# Patient Record
Sex: Female | Born: 1940 | Race: White | Hispanic: No | Smoking: Former smoker
Health system: Southern US, Community
[De-identification: ages and names within clinical notes are randomized; demographics above are authoritative.]

## PROBLEM LIST (undated history)

## (undated) DIAGNOSIS — I6389 Other cerebral infarction: Secondary | ICD-10-CM

## (undated) DIAGNOSIS — J449 Chronic obstructive pulmonary disease, unspecified: Secondary | ICD-10-CM

## (undated) DIAGNOSIS — F419 Anxiety disorder, unspecified: Secondary | ICD-10-CM

## (undated) DIAGNOSIS — Z9889 Other specified postprocedural states: Secondary | ICD-10-CM

## (undated) DIAGNOSIS — R002 Palpitations: Secondary | ICD-10-CM

## (undated) DIAGNOSIS — N3281 Overactive bladder: Secondary | ICD-10-CM

## (undated) DIAGNOSIS — J189 Pneumonia, unspecified organism: Secondary | ICD-10-CM

## (undated) DIAGNOSIS — F32A Depression, unspecified: Secondary | ICD-10-CM

## (undated) DIAGNOSIS — Z87442 Personal history of urinary calculi: Secondary | ICD-10-CM

## (undated) DIAGNOSIS — C50919 Malignant neoplasm of unspecified site of unspecified female breast: Secondary | ICD-10-CM

## (undated) DIAGNOSIS — I2510 Atherosclerotic heart disease of native coronary artery without angina pectoris: Secondary | ICD-10-CM

## (undated) DIAGNOSIS — I671 Cerebral aneurysm, nonruptured: Secondary | ICD-10-CM

## (undated) DIAGNOSIS — N393 Stress incontinence (female) (male): Secondary | ICD-10-CM

## (undated) DIAGNOSIS — Z923 Personal history of irradiation: Secondary | ICD-10-CM

## (undated) DIAGNOSIS — I639 Cerebral infarction, unspecified: Secondary | ICD-10-CM

## (undated) DIAGNOSIS — R112 Nausea with vomiting, unspecified: Secondary | ICD-10-CM

## (undated) DIAGNOSIS — T7840XA Allergy, unspecified, initial encounter: Secondary | ICD-10-CM

## (undated) DIAGNOSIS — K589 Irritable bowel syndrome without diarrhea: Secondary | ICD-10-CM

## (undated) DIAGNOSIS — M479 Spondylosis, unspecified: Secondary | ICD-10-CM

## (undated) DIAGNOSIS — M4800 Spinal stenosis, site unspecified: Secondary | ICD-10-CM

## (undated) DIAGNOSIS — I10 Essential (primary) hypertension: Secondary | ICD-10-CM

## (undated) DIAGNOSIS — K219 Gastro-esophageal reflux disease without esophagitis: Secondary | ICD-10-CM

## (undated) DIAGNOSIS — E785 Hyperlipidemia, unspecified: Secondary | ICD-10-CM

## (undated) DIAGNOSIS — Z79811 Long term (current) use of aromatase inhibitors: Secondary | ICD-10-CM

## (undated) DIAGNOSIS — D649 Anemia, unspecified: Secondary | ICD-10-CM

## (undated) DIAGNOSIS — I739 Peripheral vascular disease, unspecified: Secondary | ICD-10-CM

## (undated) DIAGNOSIS — I499 Cardiac arrhythmia, unspecified: Secondary | ICD-10-CM

## (undated) DIAGNOSIS — S272XXA Traumatic hemopneumothorax, initial encounter: Secondary | ICD-10-CM

## (undated) DIAGNOSIS — Z87448 Personal history of other diseases of urinary system: Secondary | ICD-10-CM

## (undated) DIAGNOSIS — F329 Major depressive disorder, single episode, unspecified: Secondary | ICD-10-CM

## (undated) DIAGNOSIS — M1990 Unspecified osteoarthritis, unspecified site: Secondary | ICD-10-CM

## (undated) HISTORY — DX: Other cerebral infarction: I63.89

## (undated) HISTORY — PX: VEIN SURGERY: SHX48

## (undated) HISTORY — PX: ORIF ANKLE FRACTURE: SUR919

## (undated) HISTORY — DX: Essential (primary) hypertension: I10

## (undated) HISTORY — DX: Irritable bowel syndrome, unspecified: K58.9

## (undated) HISTORY — DX: Allergy, unspecified, initial encounter: T78.40XA

## (undated) HISTORY — DX: Long term (current) use of aromatase inhibitors: Z79.811

## (undated) HISTORY — DX: Gastro-esophageal reflux disease without esophagitis: K21.9

## (undated) HISTORY — PX: ADENOIDECTOMY: SUR15

## (undated) HISTORY — DX: Personal history of irradiation: Z92.3

## (undated) HISTORY — DX: Hyperlipidemia, unspecified: E78.5

## (undated) HISTORY — PX: COLONOSCOPY: SHX5424

## (undated) HISTORY — PX: KNEE SURGERY: SHX244

## (undated) HISTORY — PX: CHOLECYSTECTOMY: SHX55

## (undated) HISTORY — PX: TONSILLECTOMY: SUR1361

## (undated) HISTORY — PX: ANKLE HARDWARE REMOVAL: SHX1149

## (undated) HISTORY — DX: Chronic obstructive pulmonary disease, unspecified: J44.9

## (undated) HISTORY — DX: Peripheral vascular disease, unspecified: I73.9

---

## 1974-03-14 HISTORY — PX: BREAST BIOPSY: SHX20

## 1974-03-14 HISTORY — PX: TUBAL LIGATION: SHX77

## 1980-03-14 DIAGNOSIS — S272XXA Traumatic hemopneumothorax, initial encounter: Secondary | ICD-10-CM

## 1980-03-14 HISTORY — DX: Traumatic hemopneumothorax, initial encounter: S27.2XXA

## 1998-08-21 ENCOUNTER — Other Ambulatory Visit: Admission: RE | Admit: 1998-08-21 | Discharge: 1998-08-21 | Payer: Self-pay | Admitting: Family Medicine

## 1999-01-29 ENCOUNTER — Encounter: Admission: RE | Admit: 1999-01-29 | Discharge: 1999-02-22 | Payer: Self-pay | Admitting: Family Medicine

## 1999-08-30 ENCOUNTER — Other Ambulatory Visit: Admission: RE | Admit: 1999-08-30 | Discharge: 1999-08-30 | Payer: Self-pay | Admitting: Family Medicine

## 2000-06-26 ENCOUNTER — Other Ambulatory Visit: Admission: RE | Admit: 2000-06-26 | Discharge: 2000-06-26 | Payer: Self-pay | Admitting: *Deleted

## 2000-10-30 ENCOUNTER — Encounter: Payer: Self-pay | Admitting: Family Medicine

## 2000-10-30 ENCOUNTER — Encounter: Admission: RE | Admit: 2000-10-30 | Discharge: 2000-10-30 | Payer: Self-pay | Admitting: Family Medicine

## 2001-01-01 ENCOUNTER — Other Ambulatory Visit: Admission: RE | Admit: 2001-01-01 | Discharge: 2001-01-01 | Payer: Self-pay | Admitting: *Deleted

## 2001-07-09 ENCOUNTER — Other Ambulatory Visit: Admission: RE | Admit: 2001-07-09 | Discharge: 2001-07-09 | Payer: Self-pay | Admitting: *Deleted

## 2001-11-19 ENCOUNTER — Encounter: Admission: RE | Admit: 2001-11-19 | Discharge: 2001-11-19 | Payer: Self-pay | Admitting: Family Medicine

## 2001-11-19 ENCOUNTER — Encounter: Payer: Self-pay | Admitting: Family Medicine

## 2002-07-02 ENCOUNTER — Encounter: Payer: Self-pay | Admitting: Family Medicine

## 2002-07-02 ENCOUNTER — Encounter: Admission: RE | Admit: 2002-07-02 | Discharge: 2002-07-02 | Payer: Self-pay | Admitting: Family Medicine

## 2002-07-12 ENCOUNTER — Other Ambulatory Visit: Admission: RE | Admit: 2002-07-12 | Discharge: 2002-07-12 | Payer: Self-pay | Admitting: *Deleted

## 2002-12-03 ENCOUNTER — Encounter: Admission: RE | Admit: 2002-12-03 | Discharge: 2002-12-03 | Payer: Self-pay | Admitting: Family Medicine

## 2002-12-03 ENCOUNTER — Encounter: Payer: Self-pay | Admitting: Family Medicine

## 2003-12-05 ENCOUNTER — Encounter: Admission: RE | Admit: 2003-12-05 | Discharge: 2003-12-05 | Payer: Self-pay | Admitting: *Deleted

## 2004-01-21 ENCOUNTER — Ambulatory Visit: Payer: Self-pay | Admitting: Internal Medicine

## 2004-01-28 ENCOUNTER — Ambulatory Visit: Payer: Self-pay | Admitting: Internal Medicine

## 2004-09-07 ENCOUNTER — Ambulatory Visit: Payer: Self-pay | Admitting: Family Medicine

## 2004-12-09 ENCOUNTER — Ambulatory Visit (HOSPITAL_COMMUNITY): Admission: RE | Admit: 2004-12-09 | Discharge: 2004-12-09 | Payer: Self-pay | Admitting: *Deleted

## 2004-12-14 ENCOUNTER — Ambulatory Visit: Payer: Self-pay | Admitting: Family Medicine

## 2004-12-21 ENCOUNTER — Encounter: Admission: RE | Admit: 2004-12-21 | Discharge: 2004-12-21 | Payer: Self-pay | Admitting: Family Medicine

## 2004-12-21 ENCOUNTER — Ambulatory Visit: Payer: Self-pay | Admitting: Family Medicine

## 2005-05-16 ENCOUNTER — Ambulatory Visit: Payer: Self-pay | Admitting: Cardiology

## 2005-05-16 ENCOUNTER — Ambulatory Visit: Payer: Self-pay | Admitting: Family Medicine

## 2005-07-05 ENCOUNTER — Ambulatory Visit (HOSPITAL_COMMUNITY): Admission: RE | Admit: 2005-07-05 | Discharge: 2005-07-05 | Payer: Self-pay | Admitting: Urology

## 2005-07-05 HISTORY — PX: CYSTOSCOPY: SUR368

## 2005-07-05 HISTORY — PX: TRANSOBTURATOR SLING: SHX2571

## 2005-08-24 ENCOUNTER — Ambulatory Visit: Payer: Self-pay | Admitting: Family Medicine

## 2005-09-01 ENCOUNTER — Encounter: Admission: RE | Admit: 2005-09-01 | Discharge: 2005-09-28 | Payer: Self-pay | Admitting: Family Medicine

## 2005-09-01 ENCOUNTER — Ambulatory Visit: Payer: Self-pay | Admitting: Family Medicine

## 2005-09-12 ENCOUNTER — Encounter: Admission: RE | Admit: 2005-09-12 | Discharge: 2005-09-12 | Payer: Self-pay | Admitting: *Deleted

## 2005-12-02 ENCOUNTER — Ambulatory Visit: Payer: Self-pay | Admitting: Family Medicine

## 2005-12-12 ENCOUNTER — Ambulatory Visit (HOSPITAL_COMMUNITY): Admission: RE | Admit: 2005-12-12 | Discharge: 2005-12-12 | Payer: Self-pay | Admitting: *Deleted

## 2005-12-15 ENCOUNTER — Ambulatory Visit: Payer: Self-pay | Admitting: Family Medicine

## 2005-12-22 ENCOUNTER — Ambulatory Visit: Payer: Self-pay | Admitting: Family Medicine

## 2005-12-28 ENCOUNTER — Ambulatory Visit: Payer: Self-pay | Admitting: Internal Medicine

## 2006-09-13 ENCOUNTER — Encounter: Admission: RE | Admit: 2006-09-13 | Discharge: 2006-09-13 | Payer: Self-pay | Admitting: *Deleted

## 2006-12-15 ENCOUNTER — Encounter: Admission: RE | Admit: 2006-12-15 | Discharge: 2006-12-15 | Payer: Self-pay | Admitting: Family Medicine

## 2006-12-22 ENCOUNTER — Ambulatory Visit: Payer: Self-pay | Admitting: Family Medicine

## 2006-12-29 ENCOUNTER — Telehealth: Payer: Self-pay | Admitting: Family Medicine

## 2007-02-02 ENCOUNTER — Ambulatory Visit: Payer: Self-pay | Admitting: Family Medicine

## 2007-02-02 LAB — CONVERTED CEMR LAB
AST: 26 units/L (ref 0–37)
Albumin: 4.1 g/dL (ref 3.5–5.2)
Alkaline Phosphatase: 60 units/L (ref 39–117)
Basophils Absolute: 0 10*3/uL (ref 0.0–0.1)
Basophils Relative: 0 % (ref 0.0–1.0)
Creatinine, Ser: 0.9 mg/dL (ref 0.4–1.2)
Eosinophils Absolute: 0.1 10*3/uL (ref 0.0–0.6)
GFR calc Af Amer: 81 mL/min
HDL: 45.8 mg/dL (ref >39.0)
Hemoglobin: 12.4 g/dL (ref 12.0–15.0)
Ketones, urine, test strip: NEGATIVE
LDL Cholesterol: 110 mg/dL — ABNORMAL HIGH (ref 0–99)
MCV: 85.4 fL (ref 78.0–100.0)
Nitrite: NEGATIVE
Potassium: 3.6 meq/L (ref 3.5–5.1)
Protein, U semiquant: NEGATIVE
RBC: 4.26 M/uL (ref 3.87–5.11)
RDW: 12.8 % (ref 11.5–14.6)
Specific Gravity, Urine: 1.025
TSH: 3.45 microintl units/mL (ref 0.35–5.50)
Total Protein: 6.8 g/dL (ref 6.0–8.3)
Urobilinogen, UA: 0.2
WBC: 5.7 10*3/uL (ref 4.5–10.5)

## 2007-02-12 ENCOUNTER — Ambulatory Visit: Payer: Self-pay | Admitting: Family Medicine

## 2007-02-12 DIAGNOSIS — J4489 Other specified chronic obstructive pulmonary disease: Secondary | ICD-10-CM | POA: Insufficient documentation

## 2007-02-12 DIAGNOSIS — K219 Gastro-esophageal reflux disease without esophagitis: Secondary | ICD-10-CM | POA: Insufficient documentation

## 2007-02-12 DIAGNOSIS — J449 Chronic obstructive pulmonary disease, unspecified: Secondary | ICD-10-CM | POA: Insufficient documentation

## 2007-05-01 ENCOUNTER — Ambulatory Visit: Payer: Self-pay | Admitting: Vascular Surgery

## 2007-07-30 ENCOUNTER — Telehealth: Payer: Self-pay | Admitting: Family Medicine

## 2007-08-14 ENCOUNTER — Ambulatory Visit: Payer: Self-pay | Admitting: Vascular Surgery

## 2007-08-27 ENCOUNTER — Ambulatory Visit: Payer: Self-pay | Admitting: Vascular Surgery

## 2007-09-04 ENCOUNTER — Ambulatory Visit: Payer: Self-pay | Admitting: Vascular Surgery

## 2007-11-26 ENCOUNTER — Ambulatory Visit: Payer: Self-pay | Admitting: Vascular Surgery

## 2007-12-10 ENCOUNTER — Ambulatory Visit: Payer: Self-pay | Admitting: Vascular Surgery

## 2007-12-17 ENCOUNTER — Encounter: Admission: RE | Admit: 2007-12-17 | Discharge: 2007-12-17 | Payer: Self-pay | Admitting: Family Medicine

## 2007-12-24 ENCOUNTER — Encounter: Admission: RE | Admit: 2007-12-24 | Discharge: 2007-12-24 | Payer: Self-pay | Admitting: Gastroenterology

## 2007-12-25 ENCOUNTER — Ambulatory Visit: Payer: Self-pay | Admitting: Family Medicine

## 2008-01-07 ENCOUNTER — Ambulatory Visit: Payer: Self-pay | Admitting: Vascular Surgery

## 2008-01-14 ENCOUNTER — Ambulatory Visit: Payer: Self-pay | Admitting: Vascular Surgery

## 2008-01-21 ENCOUNTER — Telehealth: Payer: Self-pay | Admitting: Family Medicine

## 2008-01-22 ENCOUNTER — Ambulatory Visit: Payer: Self-pay | Admitting: Internal Medicine

## 2008-01-22 ENCOUNTER — Encounter: Payer: Self-pay | Admitting: Family Medicine

## 2008-02-20 ENCOUNTER — Telehealth: Payer: Self-pay | Admitting: Family Medicine

## 2008-02-25 ENCOUNTER — Encounter: Payer: Self-pay | Admitting: Family Medicine

## 2008-02-26 ENCOUNTER — Ambulatory Visit: Payer: Self-pay | Admitting: Family Medicine

## 2008-02-26 DIAGNOSIS — E785 Hyperlipidemia, unspecified: Secondary | ICD-10-CM | POA: Insufficient documentation

## 2008-02-26 DIAGNOSIS — J45909 Unspecified asthma, uncomplicated: Secondary | ICD-10-CM | POA: Insufficient documentation

## 2008-02-26 LAB — CONVERTED CEMR LAB
Bilirubin Urine: NEGATIVE
Blood in Urine, dipstick: NEGATIVE
Glucose, Urine, Semiquant: NEGATIVE
Ketones, urine, test strip: NEGATIVE
Nitrite: NEGATIVE
Protein, U semiquant: NEGATIVE
Specific Gravity, Urine: 1.025
Urobilinogen, UA: 0.2
WBC Urine, dipstick: NEGATIVE
pH: 5

## 2008-02-27 LAB — CONVERTED CEMR LAB
Albumin: 4 g/dL (ref 3.5–5.2)
Alkaline Phosphatase: 52 units/L (ref 39–117)
Basophils Relative: 0.6 % (ref 0.0–3.0)
Calcium: 9.4 mg/dL (ref 8.4–10.5)
Eosinophils Absolute: 0.1 10*3/uL (ref 0.0–0.7)
GFR calc non Af Amer: 77 mL/min
HCT: 37.7 % (ref 36.0–46.0)
HDL: 50.2 mg/dL (ref >39.0)
Hemoglobin: 12.8 g/dL (ref 12.0–15.0)
LDL Cholesterol: 113 mg/dL — ABNORMAL HIGH (ref 0–99)
Lymphocytes Relative: 37.9 % (ref 12.0–46.0)
Monocytes Absolute: 0.4 10*3/uL (ref 0.1–1.0)
Monocytes Relative: 6.4 % (ref 3.0–12.0)
Potassium: 3.5 meq/L (ref 3.5–5.1)
TSH: 3.03 microintl units/mL (ref 0.35–5.50)
Total CHOL/HDL Ratio: 3.7
Total Protein: 6.7 g/dL (ref 6.0–8.3)
Triglycerides: 115 mg/dL (ref 0–149)
VLDL: 23 mg/dL (ref 0–40)
WBC: 5.9 10*3/uL (ref 4.5–10.5)

## 2008-03-11 ENCOUNTER — Ambulatory Visit: Payer: Self-pay | Admitting: Family Medicine

## 2008-04-03 ENCOUNTER — Ambulatory Visit: Payer: Self-pay | Admitting: Vascular Surgery

## 2008-04-17 ENCOUNTER — Ambulatory Visit: Payer: Self-pay | Admitting: Vascular Surgery

## 2008-04-24 ENCOUNTER — Telehealth: Payer: Self-pay | Admitting: Family Medicine

## 2008-04-30 ENCOUNTER — Telehealth: Payer: Self-pay | Admitting: Family Medicine

## 2008-05-30 ENCOUNTER — Telehealth: Payer: Self-pay | Admitting: *Deleted

## 2008-12-09 ENCOUNTER — Ambulatory Visit: Payer: Self-pay | Admitting: Family Medicine

## 2008-12-17 ENCOUNTER — Ambulatory Visit: Payer: Self-pay | Admitting: Family Medicine

## 2008-12-17 ENCOUNTER — Encounter: Admission: RE | Admit: 2008-12-17 | Discharge: 2008-12-17 | Payer: Self-pay | Admitting: Obstetrics and Gynecology

## 2008-12-17 DIAGNOSIS — Z87891 Personal history of nicotine dependence: Secondary | ICD-10-CM | POA: Insufficient documentation

## 2008-12-17 DIAGNOSIS — M25569 Pain in unspecified knee: Secondary | ICD-10-CM | POA: Insufficient documentation

## 2008-12-17 LAB — CONVERTED CEMR LAB: Cholesterol: 244 mg/dL — ABNORMAL HIGH (ref 0–200)

## 2008-12-18 ENCOUNTER — Telehealth: Payer: Self-pay | Admitting: Family Medicine

## 2009-02-27 ENCOUNTER — Ambulatory Visit: Payer: Self-pay | Admitting: Family Medicine

## 2009-02-27 DIAGNOSIS — R002 Palpitations: Secondary | ICD-10-CM | POA: Insufficient documentation

## 2009-03-10 ENCOUNTER — Telehealth (INDEPENDENT_AMBULATORY_CARE_PROVIDER_SITE_OTHER): Payer: Self-pay | Admitting: *Deleted

## 2009-03-11 ENCOUNTER — Ambulatory Visit: Payer: Self-pay | Admitting: Cardiovascular Disease

## 2009-03-11 ENCOUNTER — Ambulatory Visit: Payer: Self-pay

## 2009-03-11 ENCOUNTER — Encounter (HOSPITAL_COMMUNITY): Admission: RE | Admit: 2009-03-11 | Discharge: 2009-05-18 | Payer: Self-pay | Admitting: Family Medicine

## 2009-03-16 ENCOUNTER — Ambulatory Visit: Payer: Self-pay | Admitting: Family Medicine

## 2009-03-20 ENCOUNTER — Ambulatory Visit: Payer: Self-pay | Admitting: Family Medicine

## 2009-04-06 ENCOUNTER — Ambulatory Visit: Payer: Self-pay | Admitting: Family Medicine

## 2009-04-06 DIAGNOSIS — N764 Abscess of vulva: Secondary | ICD-10-CM | POA: Insufficient documentation

## 2009-04-07 ENCOUNTER — Telehealth: Payer: Self-pay | Admitting: Family Medicine

## 2009-05-21 ENCOUNTER — Ambulatory Visit: Payer: Self-pay | Admitting: Family Medicine

## 2009-05-22 ENCOUNTER — Ambulatory Visit: Payer: Self-pay | Admitting: Family Medicine

## 2009-08-11 ENCOUNTER — Ambulatory Visit: Payer: Self-pay | Admitting: Internal Medicine

## 2009-08-11 DIAGNOSIS — M549 Dorsalgia, unspecified: Secondary | ICD-10-CM | POA: Insufficient documentation

## 2009-08-18 ENCOUNTER — Ambulatory Visit: Payer: Self-pay | Admitting: Family Medicine

## 2009-08-18 LAB — CONVERTED CEMR LAB
Bilirubin Urine: NEGATIVE
Blood in Urine, dipstick: NEGATIVE

## 2009-08-19 ENCOUNTER — Encounter: Admission: RE | Admit: 2009-08-19 | Discharge: 2009-09-24 | Payer: Self-pay | Admitting: Family Medicine

## 2009-08-21 ENCOUNTER — Ambulatory Visit: Payer: Self-pay | Admitting: Family Medicine

## 2009-12-02 ENCOUNTER — Ambulatory Visit: Payer: Self-pay | Admitting: Family Medicine

## 2010-01-11 ENCOUNTER — Encounter: Admission: RE | Admit: 2010-01-11 | Discharge: 2010-01-11 | Payer: Self-pay | Admitting: Obstetrics and Gynecology

## 2010-01-12 ENCOUNTER — Telehealth: Payer: Self-pay | Admitting: Family Medicine

## 2010-03-12 ENCOUNTER — Ambulatory Visit
Admission: RE | Admit: 2010-03-12 | Discharge: 2010-03-12 | Payer: Self-pay | Source: Home / Self Care | Attending: Internal Medicine | Admitting: Internal Medicine

## 2010-03-12 ENCOUNTER — Telehealth: Payer: Self-pay

## 2010-03-22 ENCOUNTER — Encounter
Admission: RE | Admit: 2010-03-22 | Discharge: 2010-04-13 | Payer: Self-pay | Source: Home / Self Care | Attending: Internal Medicine | Admitting: Internal Medicine

## 2010-03-29 ENCOUNTER — Other Ambulatory Visit: Payer: Self-pay | Admitting: Family Medicine

## 2010-03-29 ENCOUNTER — Encounter: Payer: Self-pay | Admitting: Family Medicine

## 2010-03-29 ENCOUNTER — Ambulatory Visit
Admission: RE | Admit: 2010-03-29 | Discharge: 2010-03-29 | Payer: Self-pay | Source: Home / Self Care | Attending: Family Medicine | Admitting: Family Medicine

## 2010-03-29 LAB — CBC WITH DIFFERENTIAL/PLATELET
Basophils Absolute: 0.1 10*3/uL (ref 0.0–0.1)
Basophils Relative: 0.8 % (ref 0.0–3.0)
Eosinophils Absolute: 0.1 10*3/uL (ref 0.0–0.7)
Eosinophils Relative: 1.6 % (ref 0.0–5.0)
HCT: 37.1 % (ref 36.0–46.0)
Hemoglobin: 12.6 g/dL (ref 12.0–15.0)
Lymphocytes Relative: 31.8 % (ref 12.0–46.0)
Lymphs Abs: 2.6 10*3/uL (ref 0.7–4.0)
MCHC: 34.1 g/dL (ref 30.0–36.0)
MCV: 89.5 fl (ref 78.0–100.0)
Monocytes Absolute: 0.5 10*3/uL (ref 0.1–1.0)
Monocytes Relative: 6.1 % (ref 3.0–12.0)
Neutro Abs: 4.8 10*3/uL (ref 1.4–7.7)
Neutrophils Relative %: 59.7 % (ref 43.0–77.0)
Platelets: 209 10*3/uL (ref 150.0–400.0)
RBC: 4.15 Mil/uL (ref 3.87–5.11)
RDW: 13.6 % (ref 11.5–14.6)
WBC: 8.1 10*3/uL (ref 4.5–10.5)

## 2010-03-29 LAB — LIPID PANEL
Cholesterol: 184 mg/dL (ref 0–200)
HDL: 45.3 mg/dL (ref >39.00)
LDL Cholesterol: 102 mg/dL — ABNORMAL HIGH (ref 0–99)
Total CHOL/HDL Ratio: 4
Triglycerides: 185 mg/dL — ABNORMAL HIGH (ref 0.0–149.0)
VLDL: 37 mg/dL (ref 0.0–40.0)

## 2010-03-29 LAB — HEPATIC FUNCTION PANEL
ALT: 23 U/L (ref 0–35)
AST: 23 U/L (ref 0–37)
Albumin: 4 g/dL (ref 3.5–5.2)
Alkaline Phosphatase: 55 U/L (ref 39–117)
Bilirubin, Direct: 0.2 mg/dL (ref 0.0–0.3)
Total Bilirubin: 1.3 mg/dL — ABNORMAL HIGH (ref 0.3–1.2)
Total Protein: 6.6 g/dL (ref 6.0–8.3)

## 2010-03-29 LAB — CONVERTED CEMR LAB
Bilirubin Urine: NEGATIVE
Blood in Urine, dipstick: NEGATIVE
Ketones, urine, test strip: NEGATIVE
Nitrite: NEGATIVE
Protein, U semiquant: NEGATIVE
Urobilinogen, UA: 0.2
WBC Urine, dipstick: NEGATIVE
pH: 6

## 2010-03-29 LAB — BASIC METABOLIC PANEL
BUN: 21 mg/dL (ref 6–23)
CO2: 29 mEq/L (ref 19–32)
Calcium: 9.2 mg/dL (ref 8.4–10.5)
Chloride: 102 mEq/L (ref 96–112)
Creatinine, Ser: 0.9 mg/dL (ref 0.4–1.2)
GFR: 70.83 mL/min (ref >60.00)
Glucose, Bld: 85 mg/dL (ref 70–99)
Potassium: 3.9 mEq/L (ref 3.5–5.1)
Sodium: 140 mEq/L (ref 135–145)

## 2010-03-29 LAB — TSH: TSH: 1.87 u[IU]/mL (ref 0.35–5.50)

## 2010-04-02 ENCOUNTER — Other Ambulatory Visit: Payer: Self-pay | Admitting: Dermatology

## 2010-04-11 LAB — CONVERTED CEMR LAB
BUN: 12 mg/dL (ref 6–23)
Bilirubin Urine: NEGATIVE
Bilirubin, Direct: 0 mg/dL (ref 0.0–0.3)
CO2: 34 meq/L — ABNORMAL HIGH (ref 19–32)
Calcium: 9.6 mg/dL (ref 8.4–10.5)
Chloride: 101 meq/L (ref 96–112)
Cholesterol: 172 mg/dL (ref 0–200)
Eosinophils Absolute: 0.1 10*3/uL (ref 0.0–0.7)
GFR calc non Af Amer: 76.21 mL/min (ref >60)
Glucose, Bld: 95 mg/dL (ref 70–99)
Hemoglobin: 13.8 g/dL (ref 12.0–15.0)
Monocytes Absolute: 0.5 10*3/uL (ref 0.1–1.0)
Nitrite: NEGATIVE
Pap Smear: NORMAL
Platelets: 198 10*3/uL (ref 150.0–400.0)
Protein, U semiquant: NEGATIVE
RBC: 4.49 M/uL (ref 3.87–5.11)
RDW: 12.7 % (ref 11.5–14.6)
Total Bilirubin: 1.1 mg/dL (ref 0.3–1.2)
Urobilinogen, UA: 0.2
WBC: 6.8 10*3/uL (ref 4.5–10.5)
pH: 6

## 2010-04-13 NOTE — Progress Notes (Signed)
Summary: potassium refill  Phone Note Call from Patient   Summary of Call: will need a refill of k-dur Initial call taken by: Kern Reap CMA Duncan Dull),  April 07, 2009 5:28 PM    New/Updated Medications: KLOR-CON M20 20 MEQ CR-TABS (POTASSIUM CHLORIDE CRYS CR) take 3  once daily Prescriptions: KLOR-CON M20 20 MEQ CR-TABS (POTASSIUM CHLORIDE CRYS CR) take 3  once daily  #90 x 6   Entered by:   Kern Reap CMA (AAMA)   Authorized by:   Roderick Pee MD   Signed by:   Kern Reap CMA (AAMA) on 04/07/2009   Method used:   Electronically to        [facility]* (retail)       [address]       [place], [place]  [id number]       Ph: [phone]       Fax: [phone]   RxID:   [id number]

## 2010-04-13 NOTE — Assessment & Plan Note (Signed)
Summary: flu shot/njr  Nurse Visit   Allergies: 1)  ! Pcn 2)  ! Asa 3)  ! Sulfa 4)  ! Oxycodone Hcl 5)  ! * Tri -Cyclics Anti - Depressants 6)  ! Augmentin 7)  ! Ibuprofen (Ibuprofen)  Orders Added: 1)  Flu Vaccine 42yrs + MEDICARE PATIENTS [Q2039] 2)  Administration Flu vaccine - MCR [G0008] Flu Vaccine Consent Questions     Do you have a history of severe allergic reactions to this vaccine? no    Any prior history of allergic reactions to egg and/or gelatin? no    Do you have a sensitivity to the preservative Thimersol? no    Do you have a past history of Guillan-Barre Syndrome? no    Do you currently have an acute febrile illness? no    Have you ever had a severe reaction to latex? no    Vaccine information given and explained to patient? yes    Are you currently pregnant? no    Lot Number:[id number]   Exp Date:09/11/2010   Site Given  Left Deltoid IM .lbmedflu

## 2010-04-13 NOTE — Progress Notes (Signed)
Summary: rx directions  Phone Note From Pharmacy   Caller: [facility]* Summary of Call: rx directions  Initial call taken by: Kern Reap CMA Duncan Dull),  January 12, 2010 11:54 AM    New/Updated Medications: FLUTICASONE PROPIONATE 50 MCG/ACT  SUSP (FLUTICASONE PROPIONATE) 2 sprays per nostril two times a day Prescriptions: FLUTICASONE PROPIONATE 50 MCG/ACT  SUSP (FLUTICASONE PROPIONATE) 2 sprays per nostril two times a day  #2 x 3   Entered by:   Kern Reap CMA (AAMA)   Authorized by:   Roderick Pee MD   Signed by:   Kern Reap CMA (AAMA) on 01/12/2010   Method used:   Electronically to        [facility]* (retail)       [address]       [place], [place]  [id number]       Ph: [phone]       Fax: [phone]   RxID:   [id number]
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
## 2010-04-13 NOTE — Miscellaneous (Signed)
Summary: Initial Summary for PT Services/[facility]  Initial Summary for PT Services/[facility]   Imported By: Maryln Gottron 08/26/2009 09:32:26  _____________________________________________________________________  External Attachment:    Type:   Image     Comment:   External Document
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
## 2010-04-15 ENCOUNTER — Ambulatory Visit: Payer: Medicare Other | Attending: Family Medicine

## 2010-04-15 DIAGNOSIS — M2560 Stiffness of unspecified joint, not elsewhere classified: Secondary | ICD-10-CM | POA: Insufficient documentation

## 2010-04-15 DIAGNOSIS — R262 Difficulty in walking, not elsewhere classified: Secondary | ICD-10-CM | POA: Insufficient documentation

## 2010-04-15 DIAGNOSIS — IMO0001 Reserved for inherently not codable concepts without codable children: Secondary | ICD-10-CM | POA: Insufficient documentation

## 2010-04-15 DIAGNOSIS — M545 Low back pain, unspecified: Secondary | ICD-10-CM | POA: Insufficient documentation

## 2010-04-15 DIAGNOSIS — R293 Abnormal posture: Secondary | ICD-10-CM | POA: Insufficient documentation

## 2010-04-15 NOTE — Progress Notes (Signed)
----   Converted from flag ---- ---- 03/12/2010 12:56 PM, Edwyna Perfect MD wrote: back xray does NOT show any fracture or other serious abnormality. 

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
## 2010-06-09 ENCOUNTER — Other Ambulatory Visit: Payer: Self-pay | Admitting: Dermatology

## 2010-07-27 NOTE — Procedures (Signed)
DUPLEX DEEP VENOUS EXAM - LOWER EXTREMITY   INDICATION:  Follow-up evaluation of laser ablation of left short  saphenous vein by Dr. Hart Rochester on 08/27/07.   HISTORY:  Edema:  Mild edema in the left leg.  Trauma/Surgery:  Ablation of left short saphenous vein on 08/27/07.  Pain:  Patient complains of mild left leg pain.  PE:  No.  Previous DVT:  No.  Anticoagulants:  No.  Other:   DUPLEX EXAM:                CFV   SFV   PopV  PTV    GSV                R  L  R  L  R  L  R   L  R  L  Thrombosis    o  o     o     o      o     o  Spontaneous   +  +     +     +      +     +  Phasic        +  +     +     +      +     +  Augmentation  +  +     +     +      +     +  Compressible  +  +     +     +      +     +  Competent     +  +     +     +      +     P   Legend:  + - yes  o - no  p - partial  D - decreased   IMPRESSION:  1. 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  2. There is no thrombus seen in the left popliteal vein.  3. No evidence of left leg deep venous thrombosis or baker's cyst.   _____________________________  Quita Skye. Hart Rochester, M.D.   MC/MEDQ  D:  09/04/2007  T:  09/04/2007  Job:  [id number]
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
## 2010-07-27 NOTE — Assessment & Plan Note (Signed)
OFFICE VISIT   FIANA, GLADU  DOB:  January 23, 1941                                       09/04/2007  ZOXWR#:[id number]   The patient underwent laser ablation of her left small saphenous vein  with multiple stab phlebectomies 1 week ago for painful varicosities  secondary to reflux in the small saphenous vein.  She has had minimal  discomfort associated with this and no distal edema.  She did wear her  elastic compression stocking as requested.   On examination today the stab phlebectomy sites are all healing nicely  with minimal ecchymosis.  There is no tenderness along the course of the  small saphenous vein and no distal edema.  She continues to have greater  saphenous varicosities on the left and on the right as previously  described.  The venous duplex exam today reveals the small saphenous  vein to be totally occluded with no thrombus in the left popliteal vein  and no evidence of deep venous thrombosis.  She has had a good early  result and we will schedule her for laser ablation of her right great  saphenous vein with multiple stab phlebectomies in the near future.   Quita Skye Hart Rochester, M.D.  Electronically Signed   JDL/MEDQ  D:  09/04/2007  T:  09/05/2007  Job:  [id number]

## 2010-07-27 NOTE — Assessment & Plan Note (Signed)
OFFICE VISIT   [name], [name]  DOB:  10-Jun-1940                                       01/14/2008  ZOXWR#:[id number]   The patient underwent laser ablation of her left great saphenous vein  with 10-20 stab phlebectomies 1 week ago for painful varicosities in the  left leg.  She has had more discomfort after this procedure than the  previous 2 procedures which involved her left small saphenous vein and  her right great saphenous vein.  This has consisted of burning and  stinging discomfort in the left thigh and calf, although it does seem to  be improving.  She is wearing elastic compression stockings.  She was  unable to take ibuprofen 3 tablets three times a day, because of her  inability to take that medication, so she was taking Aleve at a much  smaller dose.  She has had no distal edema.   PHYSICAL EXAM:  The stab phlebectomy wounds are healing nicely.  There  is no evidence of any distal edema.  There is mild tenderness along the  course of the greater saphenous vein at the laser ablation site.  Venous  duplex exam looked good with thrombosis of the great saphenous vein from  the saphenofemoral junction to the calf and widely patent deep system.  She is reassured regarding these findings.  She will be having some  sclerotherapy in the near future which will be arranged by the  undersigned.   Quita Skye Hart [place], M.D.  Electronically Signed   JDL/MEDQ  D:  01/14/2008  T:  01/15/2008  Job:  [id number]

## 2010-07-27 NOTE — Assessment & Plan Note (Signed)
OFFICE VISIT   RICKEY, FARRIER  DOB:  27-Jan-1941                                       12/10/2007  ZHYQM#:[id number]   The patient underwent laser ablation of the right greater saphenous vein  with multiple stab phlebectomies on September 14 by me for painful  varicosities of the right leg.  She has had an excellent early result  with some mild to moderate discomfort along the course of the saphenous  vein in the thigh, which has now diminished significantly.  She has had  no discomfort along the site of the stab phlebectomies.  No distal edema  has been noted.  She had a venous duplex exam today, which revealed  total occlusion of the right greater saphenous vein from the knee to  near the saphenofemoral junction with no evidence of deep venous  obstruction or other abnormalities.  She was reassured regarding these  findings.  We will now schedule her for laser ablation of the left  greater saphenous vein with multiple stab phlebectomies in the near  future for painful varicosities on the left side.   Quita Skye Hart Rochester, M.D.  Electronically Signed   JDL/MEDQ  D:  12/10/2007  T:  12/11/2007  Job:  [id number]

## 2010-07-27 NOTE — Procedures (Signed)
DUPLEX DEEP VENOUS EXAM - LOWER EXTREMITY   INDICATION:  Follow-up evaluation, status post laser ablation of right  greater saphenous vein.   HISTORY:  Edema:  Right leg swelling.  Trauma/Surgery:  Left short saphenous vein laser ablation on 08/27/07,  right greater saphenous ablation on 11/26/07.  Pain:  Right calf pain with effort.  PE:  No.  Previous DVT:  No.  Anticoagulants:  No.  Other:   DUPLEX EXAM:                CFV   SFV   PopV  PTV    GSV                R  L  R  L  R  L  R   L  R  L  Thrombosis    o     o     o     o      +  Spontaneous   +     +     +     +      0  Phasic        +     +     +     +      0  Augmentation  +     +     +     +      0  Compressible  +     +     +     +      0  Competent     +     +     +     +      0   Legend:  + - yes  o - no  p - partial  D - decreased   IMPRESSION:  1. The right greater saphenous vein is thrombosed from the      saphenofemoral junction to the knee level.  2. The right common femoral vein and circumflex vein are not      thrombosed.  3. No evidence of right leg deep venous thrombosis or baker's cyst.  4. No evidence of significant right leg venous incompetence.    _____________________________  Quita Skye. Hart Rochester, M.D.   MC/MEDQ  D:  12/10/2007  T:  12/10/2007  Job:  [id number]

## 2010-07-27 NOTE — Procedures (Signed)
LOWER EXTREMITY VENOUS REFLUX EXAM   INDICATION:  Bilateral varicose veins, left greater than right, for more  than 20 years.  Patient has no history of DVT.   EXAM:  Using color-flow imaging and pulse Doppler spectral analysis, the  right and left common femoral, superficial femoral, popliteal, posterior  tibial, greater and lesser saphenous veins are evaluated.  There is no  evidence suggesting deep venous insufficiency in the right and left  lower extremity.   The right saphenofemoral junction is not competent.  The left GSV is not  competent with the caliber as described below.   The left proximal short saphenous vein demonstrates incompetency.   Right saphenofemoral junction is 0.66.  Left saphenofemoral junction is  0.78.   GSV Diameter (used if found to be incompetent only)                                            Right    Left  Proximal Greater Saphenous Vein           0.50 cm  0.49 cm  Proximal-to-mid-thigh                     0.43 cm  0.42 cm  Mid thigh                                 0.39 cm  0.40 cm  Mid-distal thigh                          0.49 cm  0.31 cm  Distal thigh                              0.37 cm  0.34 cm  Knee                                      0.38 cm  0.34 cm    IMPRESSION:  1. Right and left greater saphenous vein reflux is identified with the      caliber ranging from 0.38 cm to 0.66 cm knee to groin on the right,      and 0.34 cm to 0.78 cm knee to groin on the left.  2. The right and left greater saphenous vein are not aneurysmal.  3. The right and left greater saphenous vein are not tortuous.  4. The deep venous system is competent.  5. The left short saphenous vein is incompetent.  6. The right short saphenous vein is competent.  7. The left short saphenous vein measures approximately 0.39 cm      proximally to 0.28 cm, AP proximal to mid.   ___________________________________________  Quita Skye. Hart Rochester, M.D.   DP/MEDQ   D:  05/01/2007  T:  05/02/2007  Job:  [id number]

## 2010-07-27 NOTE — Assessment & Plan Note (Signed)
OFFICE VISIT   Alexandria [name], Alexandria [name]  DOB:  November 23, 1940                                       08/14/2007  AOZHY#:[id number]   The patient returns today for further followup regarding her severe  venous insufficiency of both lower extremities which is quite  symptomatic.  She continues to have aching, burning, itching and  throbbing discomfort in both calves and thighs which is worse on the  left side and progressive swelling in the left and right calf and ankle  as the day progresses.  She has been wearing her graduated elastic  compression stockings on a daily basis with no improvement in her pain  and actually this has been exacerbating her symptoms.  She does elevate  the legs and has tried analgesics (ibuprofen) with no improvement in her  symptoms.  This is affecting her daily living and her ability to  ambulate and be on her feet during the day without symptoms.   PHYSICAL EXAMINATION:  Vital signs:  On physical exam today blood  pressure 130/82, heart rate 70, respirations 14.  Extremities:  She  continues to have painful varicosities, particularly in the left  posterior calf and medial calf and also on the right thigh and calf  area.  She has reflux in her left small saphenous vein as well as her  left great saphenous vein and her right great saphenous vein and  saphenofemoral junction.   I think we should proceed with laser ablation of the left small  saphenous vein initially with stab phlebectomies followed by laser  ablation of her left great saphenous vein with stab phlebectomies and as  a third procedure laser ablation of her right great saphenous vein with  stab phlebectomies.  We will proceed with precertification for these  severely symptomatic varicosities secondary to reflux.   Quita Skye Hart Rochester, M.D.  Electronically Signed   JDL/MEDQ  D:  08/14/2007  T:  08/15/2007  Job:  [id number]

## 2010-07-27 NOTE — Procedures (Signed)
DUPLEX DEEP VENOUS EXAM - LOWER EXTREMITY   INDICATION:  Followup evaluation status post laser ablation.   HISTORY:  Edema:  Mild left leg edema.  Trauma/Surgery:  Left greater saphenous vein ablation 01/07/2008, right  greater saphenous vein ablation 11/26/2007, left short saphenous vein  ablation 08/27/2007.  Pain:  Left leg pain, especially along the distal thigh.  PE:  No.  Previous DVT:  No.  Anticoagulants:  No.  Other:   DUPLEX EXAM:                CFV   SFV   PopV  PTV    GSV                R  L  R  L  R  L  R   L  R  L  Thrombosis    0  0     0     0      0  +  +  Spontaneous   +  +     +     +      +  0  0  Phasic        +  +     +     +      +  0  0  Augmentation  +  +     +     +      +  0  0  Compressible  +  +     +     +      +  0  0  Competent     P  +     +     +      +  0  0   Legend:  + - yes  o - no  p - partial  D - decreased   IMPRESSION:  1. Left greater saphenous vein is thrombosed from th saphenofemoral      junction into the calf.  2. No thrombus is seen in the left circumflex or common femoral vein.  3. No evidence of left leg DVT or Baker's cyst.  4. No evidence of significant left leg venous incompetence.    _____________________________  Quita Skye. Hart Rochester, M.D.   MC/MEDQ  D:  01/14/2008  T:  01/14/2008  Job:  [id number]

## 2010-07-30 NOTE — Op Note (Signed)
NAMEROBECCA, FULGHAM NO.:  [id number]   MEDICAL RECORD NO.:  [id number]          PATIENT TYPE:  AMB   LOCATION:  DAY                          FACILITY:  [facility]   PHYSICIAN:  Jamison Neighbor, M.D.  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   OPERATION PERFORMED:  1.  Transobturator sling using a [employer]arch/AMS tape.  2.  Cystoscopy.   SURGEON:  Jamison Neighbor, M.D.   ASSISTANT:  Cornelious Bryant, MD   ANESTHESIA:  General.   ESTIMATED BLOOD LOSS:  20 mL.   COMPLICATIONS:  None.   INDICATIONS FOR PROCEDURE:  This is a 70 year old lady who was seen and  evaluated and Dr. Logan Bores  diagnosed  mixed incontinence.  The patient  underwent urodynamics testing that revealed a leak point pressure of 60  mmH2O. The patient at the time had somewhat hypersensitive sensation with  strong urge at 150 mL.  The patient was then counseled as to the various  treatment options and she opted to proceed with a transobturator sling to  treat this stress incontinence knowing that there is about a 20% chance that  her symptoms might worsen with an increased requirement for  anticholinergics.   DESCRIPTION OF PROCEDURE:  The patient was brought to the operating room.  Time out was taken to properly identify patient and procedure to be done.  General anesthesia was induced.  She was placed in dorsal lithotomy  position.  She was prepped and draped in the normal sterile fashion.  Before  prepping and draping, her pubic and perineal hair was clipped.  A weighted  vaginal speculum was then inserted in the vagina.  A 16 French Foley  catheter was then inserted and 10 mL of water were used to insufflate the  balloon.  An  Allis clamp was then used to hold a small ridge of vaginal  mucosa just proximal to the urethral meatus.  Using knife about a 2 cm long  midline anterior vaginal incision was made along the middle of the urethra.  Two Allis clamps were then placed on either side of the incision and using  Struhles, a space was developed lateral to the right and left of the urethra  up to the level of the inferior pubic ramus laterally.  A small skin  incision was made 12 cm lateral to the clitoris to the left and right and  the sling probe was then inserted initially on the left side and popped into  the obturator fossa just above the left inferior pubic ramus.  Rotating the  probe towards the newly fashioned space on the left of the urethra, the  probe was then guided with the tip of the finger of the other hand out  through the vaginal wound.  Inspection of the anterior left vaginal fornix  did not reveal any  evidence of protrusion of the probe into the fornix.  The  same procedure was then done on the right side.  The sling was then loaded  onto the probes and the probes were then taken out thus deploying the tape  along the urethra.  A right finger was then applied making sure that the  tape was not too tight around the urethra.  At this point the Foley catheter  was then taken out and cystoscope was then used to assess the bladder.  Using the 12 and the 70 degree lens, there was no evidence of bladder  violation or string material protrusion.  Upon slowly taking the cystoscope  out, there was adequate coaptation of the urethra mucosa with no evidence of  overcorrection or tenting of the bladder neck upward. The scope was remained  parallel to the floor during entire withdrawal of the scope with no evidence  of upsloping of the urethra.  The Foley catheter was inserted and the  bladder was then emptied.  This Foley catheter was then taken out.  The  redundant vaginal mucosa was then trimmed and the  vaginal wound was closed  in horizontal fashion using 2-0 Vicryl in running fashion.  It should be  noted that prior to closing the wound, the plastic covering over the tape  was then removed and again the right angle was then inserted between the  tape and the urethra and the tape was not forcefully abutting the urethra.  Repeat cystoscopy again demonstrated good palpation of the mucosa with no  evidence of upsloping of the urethra or the bladder neck and the scope  remained parallel to the floor during the inspection.  There was also  confirmation of no violation of the bladder  by the sling procedure.  The  scope was then taken out.  Both sides excessive tape was then cut and the  wounds were closed with Dermabond.  Vaginal packs were then applied.  The  patient was then awakened from anesthesia and taken in stable condition to  PACU with no complications.  Please note that Dr. Logan Bores was present and  participated in the entire procedure as he was the responsible surgeon.  The  patient will be discharged home from PACU after she gets a voiding trial and  has the vaginal packs removed.  She is to follow up in Dr. Sharlot Gowda clinic in  two to three weeks.     ______________________________  Terie Purser, MD      Jamison Neighbor, M.D.  Electronically Signed    JH/MEDQ  D:  07/05/2005  T:  07/06/2005  Job:  [id number]

## 2010-12-06 ENCOUNTER — Other Ambulatory Visit: Payer: Self-pay | Admitting: Obstetrics and Gynecology

## 2010-12-06 DIAGNOSIS — Z1231 Encounter for screening mammogram for malignant neoplasm of breast: Secondary | ICD-10-CM

## 2010-12-15 ENCOUNTER — Ambulatory Visit (INDEPENDENT_AMBULATORY_CARE_PROVIDER_SITE_OTHER): Payer: Medicare Other

## 2010-12-15 DIAGNOSIS — Z23 Encounter for immunization: Secondary | ICD-10-CM

## 2011-01-04 ENCOUNTER — Encounter: Payer: Self-pay | Admitting: Family Medicine

## 2011-01-04 ENCOUNTER — Ambulatory Visit (INDEPENDENT_AMBULATORY_CARE_PROVIDER_SITE_OTHER): Payer: Medicare Other | Admitting: Family Medicine

## 2011-01-04 VITALS — BP 120/80 | Temp 97.8°F | Wt 161.0 lb

## 2011-01-04 DIAGNOSIS — L255 Unspecified contact dermatitis due to plants, except food: Secondary | ICD-10-CM

## 2011-01-04 DIAGNOSIS — L247 Irritant contact dermatitis due to plants, except food: Secondary | ICD-10-CM | POA: Insufficient documentation

## 2011-01-04 MED ORDER — PREDNISONE 20 MG PO TABS: ORAL_TABLET | ORAL | Status: DC

## 2011-01-04 NOTE — Patient Instructions (Signed)
Take the prednisone as directed.  Also, you can take a plain  10-mg Claritin in the morning and 25 to 50 mg of Benadryl at bedtime for itching

## 2011-01-04 NOTE — Progress Notes (Signed)
  Subjective:    Patient ID: Alexandria [name], female    DOB: 04-19-1940, 70 y.o.   MRN: [id number]  HPI Alexandria [name]  is a 70 year old, married female, who comes in today for evaluation of a skin rash.  About a week ago she noticed some swelling and itching of her mouth.  Now the rash has spread to her face and neck.  No particular trigger.   Review of Systems In general, and dermatologic review of systems otherwise negative    Objective:   Physical Exam  Well-developed well-nourished, female, in no acute distress.  Examination of face and neck shows a rash consistent with a contact dermatitis      Assessment & Plan:  Contact dermatitis.  Plan prednisone burst and taper return p.r.n.

## 2011-01-13 ENCOUNTER — Ambulatory Visit: Payer: Medicare Other

## 2011-02-15 ENCOUNTER — Ambulatory Visit
Admission: RE | Admit: 2011-02-15 | Discharge: 2011-02-15 | Disposition: A | Discharge status: Home / Self Care | Payer: Medicare Other | Source: Ambulatory Visit | Attending: Obstetrics and Gynecology | Admitting: Obstetrics and Gynecology

## 2011-02-15 DIAGNOSIS — Z1231 Encounter for screening mammogram for malignant neoplasm of breast: Secondary | ICD-10-CM

## 2011-03-15 DIAGNOSIS — I6389 Other cerebral infarction: Secondary | ICD-10-CM

## 2011-03-15 HISTORY — PX: BREAST LUMPECTOMY: SHX2

## 2011-03-15 HISTORY — DX: Other cerebral infarction: I63.89

## 2011-04-06 ENCOUNTER — Encounter: Payer: Self-pay | Admitting: Internal Medicine

## 2011-04-30 ENCOUNTER — Encounter (HOSPITAL_BASED_OUTPATIENT_CLINIC_OR_DEPARTMENT_OTHER): Payer: Self-pay | Admitting: *Deleted

## 2011-04-30 ENCOUNTER — Emergency Department (INDEPENDENT_AMBULATORY_CARE_PROVIDER_SITE_OTHER): Payer: Medicare Other

## 2011-04-30 ENCOUNTER — Emergency Department (HOSPITAL_BASED_OUTPATIENT_CLINIC_OR_DEPARTMENT_OTHER)
Admission: EM | Admit: 2011-04-30 | Discharge: 2011-04-30 | Disposition: A | Discharge status: Home / Self Care | Payer: Medicare Other | Attending: Emergency Medicine | Admitting: Emergency Medicine

## 2011-04-30 DIAGNOSIS — E785 Hyperlipidemia, unspecified: Secondary | ICD-10-CM | POA: Insufficient documentation

## 2011-04-30 DIAGNOSIS — W19XXXA Unspecified fall, initial encounter: Secondary | ICD-10-CM

## 2011-04-30 DIAGNOSIS — S63509A Unspecified sprain of unspecified wrist, initial encounter: Secondary | ICD-10-CM | POA: Insufficient documentation

## 2011-04-30 DIAGNOSIS — IMO0001 Reserved for inherently not codable concepts without codable children: Secondary | ICD-10-CM | POA: Insufficient documentation

## 2011-04-30 DIAGNOSIS — I10 Essential (primary) hypertension: Secondary | ICD-10-CM | POA: Insufficient documentation

## 2011-04-30 DIAGNOSIS — I739 Peripheral vascular disease, unspecified: Secondary | ICD-10-CM | POA: Insufficient documentation

## 2011-04-30 DIAGNOSIS — S59909A Unspecified injury of unspecified elbow, initial encounter: Secondary | ICD-10-CM

## 2011-04-30 DIAGNOSIS — J45909 Unspecified asthma, uncomplicated: Secondary | ICD-10-CM | POA: Insufficient documentation

## 2011-04-30 DIAGNOSIS — M25539 Pain in unspecified wrist: Secondary | ICD-10-CM

## 2011-04-30 MED ORDER — HYDROCODONE-ACETAMINOPHEN 5-325 MG PO TABS: 2.0000 | ORAL_TABLET | ORAL | Status: AC | PRN

## 2011-04-30 NOTE — Discharge Instructions (Signed)
Joint Sprain A sprain is a tear or stretch in the ligaments that hold a joint together. Severe sprains may need as long as 3-6 weeks of immobilization and/or exercises to heal completely. Sprained joints should be rested and protected. If not, they can become unstable and prone to re-injury. Proper treatment can reduce your pain, shorten the period of disability, and reduce the risk of repeated injuries. TREATMENT   Rest and elevate the injured joint to reduce pain and swelling.   Apply ice packs to the injury for 20-30 minutes every 2-3 hours for the next 2-3 days.   Keep the injury wrapped in a compression bandage or splint as long as the joint is painful or as instructed by your caregiver.   Do not use the injured joint until it is completely healed to prevent re-injury and chronic instability. Follow the instructions of your caregiver.   Long-term sprain management may require exercises and/or treatment by a physical therapist. Taping or special braces may help stabilize the joint until it is completely better.  SEEK MEDICAL CARE IF:   You develop increased pain or swelling of the joint.   You develop increasing redness and warmth of the joint.   You develop a fever.   It becomes stiff.   Your hand or foot gets cold or numb.  Document Released: 04/07/2004 Document Revised: 11/10/2010 Document Reviewed: 03/17/2008 [employer] Patient Information 2012 [employer]. 
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
## 2011-04-30 NOTE — ED Notes (Signed)
Pt states she fell 9 days ago and injured her left wrist. Has been self-treating with splint and meds, but now has "decreased functionality and increased pain"

## 2011-05-01 NOTE — ED Provider Notes (Signed)
Medical screening examination/treatment/procedure(s) were performed by non-physician practitioner and as supervising physician I was immediately available for consultation/collaboration.  Aysia Lowder, MD 05/01/11 0035 

## 2011-05-16 ENCOUNTER — Telehealth: Payer: Self-pay | Admitting: Family Medicine

## 2011-05-16 MED ORDER — ONDANSETRON HCL 4 MG PO TABS: 4.0000 mg | ORAL_TABLET | ORAL | Status: DC | PRN

## 2011-05-16 NOTE — Telephone Encounter (Signed)
Zofran 4 mg dispensed 10 tablets directions one every 4-6 hours when necessary for nausea and vomiting no refills

## 2011-05-16 NOTE — Telephone Encounter (Signed)
Rx sent and patient is aware. 

## 2011-05-16 NOTE — Telephone Encounter (Signed)
Pt son would like to pick up medication for his mother before he return home.

## 2011-05-16 NOTE — Telephone Encounter (Signed)
Pts son called said that her mother had called and lft vm for triage nurse this morning re: nausea, diarrhea and vomitting since last night. Pt feels that she can not make it in for ov and would like pcp to call in nausea med to [facility] [place]. Pt req med called in asap.

## 2011-05-19 ENCOUNTER — Other Ambulatory Visit: Payer: Self-pay | Admitting: Family Medicine

## 2011-06-01 ENCOUNTER — Ambulatory Visit (INDEPENDENT_AMBULATORY_CARE_PROVIDER_SITE_OTHER): Payer: Medicare Other | Admitting: Licensed Clinical Social Worker

## 2011-06-01 DIAGNOSIS — F4323 Adjustment disorder with mixed anxiety and depressed mood: Secondary | ICD-10-CM

## 2011-06-08 ENCOUNTER — Ambulatory Visit (INDEPENDENT_AMBULATORY_CARE_PROVIDER_SITE_OTHER): Payer: Medicare Other | Admitting: Licensed Clinical Social Worker

## 2011-06-08 DIAGNOSIS — F4323 Adjustment disorder with mixed anxiety and depressed mood: Secondary | ICD-10-CM

## 2011-06-13 ENCOUNTER — Ambulatory Visit (INDEPENDENT_AMBULATORY_CARE_PROVIDER_SITE_OTHER): Payer: Medicare Other | Admitting: Licensed Clinical Social Worker

## 2011-06-13 DIAGNOSIS — F4323 Adjustment disorder with mixed anxiety and depressed mood: Secondary | ICD-10-CM

## 2011-06-23 ENCOUNTER — Ambulatory Visit (INDEPENDENT_AMBULATORY_CARE_PROVIDER_SITE_OTHER): Payer: Medicare Other | Admitting: Family Medicine

## 2011-06-23 ENCOUNTER — Encounter: Payer: Self-pay | Admitting: Family Medicine

## 2011-06-23 VITALS — BP 120/80 | Temp 98.3°F | Wt 136.0 lb

## 2011-06-23 DIAGNOSIS — R279 Unspecified lack of coordination: Secondary | ICD-10-CM

## 2011-06-23 DIAGNOSIS — R270 Ataxia, unspecified: Secondary | ICD-10-CM | POA: Insufficient documentation

## 2011-06-23 LAB — HEPATIC FUNCTION PANEL
ALT: 16 U/L (ref 0–35)
Alkaline Phosphatase: 43 U/L (ref 39–117)
Bilirubin, Direct: 0.2 mg/dL (ref 0.0–0.3)
Total Bilirubin: 1.2 mg/dL (ref 0.3–1.2)
Total Protein: 6.8 g/dL (ref 6.0–8.3)

## 2011-06-23 LAB — POCT URINALYSIS DIPSTICK
Blood, UA: NEGATIVE
Glucose, UA: NEGATIVE
Nitrite, UA: NEGATIVE
Protein, UA: NEGATIVE
Spec Grav, UA: 1.01
Urobilinogen, UA: 0.2

## 2011-06-23 LAB — BASIC METABOLIC PANEL
BUN: 21 mg/dL (ref 6–23)
CO2: 28 mEq/L (ref 19–32)
Calcium: 9.3 mg/dL (ref 8.4–10.5)
Creatinine, Ser: 0.9 mg/dL (ref 0.4–1.2)
GFR: 66.92 mL/min (ref >60.00)
Glucose, Bld: 82 mg/dL (ref 70–99)

## 2011-06-23 LAB — CBC WITH DIFFERENTIAL/PLATELET
Basophils Absolute: 0.1 10*3/uL (ref 0.0–0.1)
Basophils Relative: 1.2 % (ref 0.0–3.0)
Eosinophils Absolute: 0.2 10*3/uL (ref 0.0–0.7)
Hemoglobin: 12.5 g/dL (ref 12.0–15.0)
Lymphocytes Relative: 41 % (ref 12.0–46.0)
MCHC: 33.1 g/dL (ref 30.0–36.0)
MCV: 90.8 fl (ref 78.0–100.0)
Monocytes Absolute: 0.4 10*3/uL (ref 0.1–1.0)
Neutro Abs: 3 10*3/uL (ref 1.4–7.7)
Neutrophils Relative %: 48 % (ref 43.0–77.0)
RBC: 4.16 Mil/uL (ref 3.87–5.11)
RDW: 13.3 % (ref 11.5–14.6)

## 2011-06-23 LAB — VITAMIN B12: Vitamin B-12: 389 pg/mL (ref 211–911)
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
## 2011-06-23 NOTE — Patient Instructions (Signed)
We will begin your evaluation with lab work and an MRI of your brain and a consult with Dr. Molli Hazard long

## 2011-06-27 ENCOUNTER — Encounter: Payer: Self-pay | Admitting: Neurology

## 2011-06-28 ENCOUNTER — Ambulatory Visit (INDEPENDENT_AMBULATORY_CARE_PROVIDER_SITE_OTHER): Payer: Medicare Other | Admitting: Licensed Clinical Social Worker

## 2011-06-28 DIAGNOSIS — F4323 Adjustment disorder with mixed anxiety and depressed mood: Secondary | ICD-10-CM

## 2011-06-29 ENCOUNTER — Ambulatory Visit: Payer: Medicare Other | Admitting: Family Medicine

## 2011-07-15 ENCOUNTER — Ambulatory Visit (INDEPENDENT_AMBULATORY_CARE_PROVIDER_SITE_OTHER): Payer: Medicare Other | Admitting: Licensed Clinical Social Worker

## 2011-07-15 DIAGNOSIS — F4323 Adjustment disorder with mixed anxiety and depressed mood: Secondary | ICD-10-CM

## 2011-07-27 ENCOUNTER — Ambulatory Visit (INDEPENDENT_AMBULATORY_CARE_PROVIDER_SITE_OTHER): Payer: Medicare Other | Admitting: Licensed Clinical Social Worker

## 2011-07-27 DIAGNOSIS — F4323 Adjustment disorder with mixed anxiety and depressed mood: Secondary | ICD-10-CM

## 2011-08-10 ENCOUNTER — Ambulatory Visit (INDEPENDENT_AMBULATORY_CARE_PROVIDER_SITE_OTHER): Payer: Medicare Other | Admitting: Licensed Clinical Social Worker

## 2011-08-10 DIAGNOSIS — F4323 Adjustment disorder with mixed anxiety and depressed mood: Secondary | ICD-10-CM

## 2011-08-12 ENCOUNTER — Other Ambulatory Visit: Payer: Self-pay | Admitting: Family Medicine

## 2011-08-29 ENCOUNTER — Ambulatory Visit (INDEPENDENT_AMBULATORY_CARE_PROVIDER_SITE_OTHER): Payer: Medicare Other | Admitting: Neurology

## 2011-08-29 ENCOUNTER — Encounter: Payer: Self-pay | Admitting: Neurology

## 2011-08-29 ENCOUNTER — Other Ambulatory Visit (INDEPENDENT_AMBULATORY_CARE_PROVIDER_SITE_OTHER): Payer: Medicare Other

## 2011-08-29 VITALS — BP 122/60 | HR 96 | Ht 64.0 in | Wt 133.0 lb

## 2011-08-29 DIAGNOSIS — H8190 Unspecified disorder of vestibular function, unspecified ear: Secondary | ICD-10-CM

## 2011-08-29 DIAGNOSIS — R270 Ataxia, unspecified: Secondary | ICD-10-CM

## 2011-08-29 DIAGNOSIS — R279 Unspecified lack of coordination: Secondary | ICD-10-CM

## 2011-08-29 DIAGNOSIS — I6350 Cerebral infarction due to unspecified occlusion or stenosis of unspecified cerebral artery: Secondary | ICD-10-CM

## 2011-08-29 DIAGNOSIS — I639 Cerebral infarction, unspecified: Secondary | ICD-10-CM

## 2011-08-29 LAB — BASIC METABOLIC PANEL
BUN: 16 mg/dL (ref 6–23)
GFR: 71.5 mL/min (ref >60.00)
Potassium: 4 mEq/L (ref 3.5–5.1)
Sodium: 143 mEq/L (ref 135–145)

## 2011-08-29 NOTE — Patient Instructions (Addendum)
Go to the basement to have your labs drawn today.  Your MRI and MRA's are scheduled for Wednesday, June 19 at 7:00pm.  Please arrive to [facility] by 6:45pm.  [phone].  We will see you back on August 1 at 8:30am.

## 2011-08-29 NOTE — Progress Notes (Signed)
Dear Dr. Tawanna Cooler,  Thank you for having me see Alexandria [name] in consultation today at [facility] for her problem with gait ataxia.  As you may recall, she is a 71 y.o. year old female with a history of anxiety and depression, hypertension, hyperlipidemia and hearing loss who presents with worsening problems with gait since a fall in January. She's had problems with falls for several years before this. However in January she fel she fell and hit her occiput. She was not knocked unconscious nor did she have a scalp laceration. She is uncertain the cause of the fall. Since then she feels she's had progressive difficulty walking. She feels like she is leaning to the right. She's had multiple falls since then. They are without loss of consciousness. She has chronic tinnitus and hearing loss. She feels her hearing is gotten worse over the last 6 months. There been no changes in her bladder habits. No medications that could account for her ataxia. She also has had some problems with word finding. She denies numbness in her legs.she does have a history of intermittent neck and back pain. At times she can get radiating pain down her legs and she's been told she has possible lumbar stenosis. She has not had recent pain however.   Medical History:   hypertension, hearing loss, hyperlipidemia  Surgical History:  cholecystectomy, femur surgery for distal left femur fracture   Social History:  no history of alcohol. She is a former psychiatric nurse.   Family History:  no significant history of difficulties with balance.   Current Outpatient Prescriptions on File Prior to Visit  Medication Sig Dispense Refill  . ADVAIR DISKUS 100-50 MCG/DOSE AEPB INHALE 1 PUFF TWICE DAILY.  60 each  1  . albuterol (PROVENTIL HFA;VENTOLIN HFA) 108 (90 BASE) MCG/ACT inhaler Inhale 2 puffs into the lungs every 6 (six) hours as needed. For shortness of breath and wheezing      . atorvastatin (LIPITOR) 20 MG tablet Take 20 mg by  mouth daily. Twice weekly      . b complex vitamins tablet Take 1 tablet by mouth daily.        . Cholecalciferol (VITAMIN D-3) 5000 UNITS TABS Take 5,000 Units by mouth daily.      . cyclobenzaprine (FLEXERIL) 5 MG tablet Take 5 mg by mouth 3 (three) times daily as needed. For muscle spasms      . escitalopram (LEXAPRO) 10 MG tablet Take 10 mg by mouth. 1/2 - 1 at bedtime      . fluticasone (FLONASE) 50 MCG/ACT nasal spray Place 2 sprays into the nose daily.        Marland Kitchen glucosamine-chondroitin 500-400 MG tablet Take 1 tablet by mouth 3 (three) times daily.        Marland Kitchen HYDROcodone-acetaminophen (VICODIN) 5-500 MG per tablet Take 1 tablet by mouth every 6 (six) hours as needed. For pain      . K-DUR 20 MEQ tablet TAKE 3 TABLETS DAILY.  102 each  1  . loratadine (CLARITIN) 10 MG tablet Take 10 mg by mouth daily.        . meloxicam (MOBIC) 15 MG tablet Take 30 mg by mouth daily.       Marland Kitchen NEXIUM 40 MG capsule TAKE 1 CAPSULE EACH MORNING.  90 each  3  . Probiotic Product (ALIGN PO) Take 1 tablet by mouth daily.       Marland Kitchen selenium 50 MCG TABS Take 50 mcg by mouth daily.        Marland Kitchen  TENORETIC 50 50-25 MG per tablet TAKE 1 TABLET IN THE MORNING.  90 each  3  . ZOFRAN 4 MG tablet TAKE  (1)  TABLET  EVERY FOUR HOURS AS NEEDED FOR NAUSEA.  10 each  1     Allergies  Allergen Reactions  . Aspirin Hives  . Augmentin (Amoxicillin-Pot Clavulanate) Itching    Hands break out  . Chocolate Hives    Runny nose   . Coffee Bean Extract Hives    Runny nose   . Ibuprofen Hives    Runny nose  . Oxycodone Hcl Hives  . Penicillins Hives  . Shrimp (Shellfish Allergy) Hives    Runny nose   . Sulfonamide Derivatives Hives      Review of systems:  13 systems were reviewed and are notable for alopecia.  All other review of systems are unremarkable.   Examination:  Filed Vitals:   08/29/11 0913  BP: 122/60  Pulse: 96  Height: 5\' 4"  (1.626 m)  Weight: 133 lb (60.328 kg)     In general, well appearing  women.  Cardiovascular: The patient has a regular rate and rhythm and no carotid bruits.  Fundoscopy:  Disks are flat. Vessel caliber within normal limits. +SVP Mental status:   The patient is oriented to person, place and time. Recent and remote memory are intact. Attention span and concentration are normal. Language including repetition, naming, following commands are intact. Fund of knowledge of current and historical events, as well as vocabulary are normal.  Cranial Nerves: Pupils are equally round and reactive to light. Visual fields full to confrontation. Extraocular movements are intact. She appears to have gaze evoked nystagmus much worse to the right.  Facial sensation and muscles of mastication are intact. Muscles of facial expression activate equally but involuntarily she seems to have a right facial droop.Hearing decreased on left.  Weber lateralizes right.  A>C bilateral ears. Tongue protrusion, uvula, palate midline.  Shoulder shrug intact. Motor:  The patient has normal bulk and tone, no pronator drift.  There are no adventitious movements.  No orbiting.  Right hand is a little clumsier than the left but she is left habnded  5/5 muscle strength bilaterally.  Reflexes:  3+ except 2+ at ankles  Toes down  Coordination:  Normal finger to nose.  No dysdiadokinesia.  She has titubation when sitting with her head up.  Past points to left.  Sensation is intact to temperature and vibration in UE, in lower length dep temp loss, position mildly impaired, vibration intact. Gait and Station are mildly wide based.  Romberg is positive.  Head thrust + turning left.  No head shaking nystagmus.  Impression and Recommendations: 1.  Gait ataxia - Given her nystagmus I think it is either cerebellar or due to a vestibulopathy, possibly left sided.  I am going to get an MRI brain with and without contrast, thin cuts through brainstem, and MRA head and neck.  If this is negative I am going to  send her for gait therapy.  I am concerned that she may have either had an ischemic stroke, SDH or has a peripheral vestibulopathy secondary to a lesion.  We will see the patient back in 6 weeks.  Thank you for having [place] see Alexandria [name] in consultation.  Feel free to contact me with any questions.  Lupita Raider Modesto Charon, MD [facility],  [address] [place], [place] [address] Phone: [phone] Fax: [phone].

## 2011-08-30 ENCOUNTER — Ambulatory Visit (INDEPENDENT_AMBULATORY_CARE_PROVIDER_SITE_OTHER): Payer: 59 | Admitting: Licensed Clinical Social Worker

## 2011-08-30 ENCOUNTER — Encounter: Payer: Self-pay | Admitting: Family Medicine

## 2011-08-30 ENCOUNTER — Ambulatory Visit (INDEPENDENT_AMBULATORY_CARE_PROVIDER_SITE_OTHER): Payer: Medicare Other | Admitting: Family Medicine

## 2011-08-30 VITALS — BP 124/80 | Temp 98.4°F | Ht 64.5 in | Wt 133.0 lb

## 2011-08-30 DIAGNOSIS — J45909 Unspecified asthma, uncomplicated: Secondary | ICD-10-CM

## 2011-08-30 DIAGNOSIS — J449 Chronic obstructive pulmonary disease, unspecified: Secondary | ICD-10-CM

## 2011-08-30 DIAGNOSIS — M549 Dorsalgia, unspecified: Secondary | ICD-10-CM

## 2011-08-30 DIAGNOSIS — Z0000 Encounter for general adult medical examination without abnormal findings: Secondary | ICD-10-CM

## 2011-08-30 DIAGNOSIS — R002 Palpitations: Secondary | ICD-10-CM

## 2011-08-30 DIAGNOSIS — E785 Hyperlipidemia, unspecified: Secondary | ICD-10-CM

## 2011-08-30 DIAGNOSIS — Z23 Encounter for immunization: Secondary | ICD-10-CM

## 2011-08-30 DIAGNOSIS — IMO0002 Reserved for concepts with insufficient information to code with codable children: Secondary | ICD-10-CM

## 2011-08-30 DIAGNOSIS — F4323 Adjustment disorder with mixed anxiety and depressed mood: Secondary | ICD-10-CM

## 2011-08-30 DIAGNOSIS — K219 Gastro-esophageal reflux disease without esophagitis: Secondary | ICD-10-CM

## 2011-08-30 MED ORDER — FLUTICASONE-SALMETEROL 100-50 MCG/DOSE IN AEPB: 1.0000 | INHALATION_SPRAY | Freq: Two times a day (BID) | RESPIRATORY_TRACT | Status: DC

## 2011-08-30 MED ORDER — MELOXICAM 15 MG PO TABS: 30.0000 mg | ORAL_TABLET | Freq: Every day | ORAL | Status: DC

## 2011-08-30 MED ORDER — FLUTICASONE PROPIONATE 50 MCG/ACT NA SUSP: 2.0000 | Freq: Every day | NASAL | Status: DC

## 2011-08-30 MED ORDER — ALBUTEROL SULFATE HFA 108 (90 BASE) MCG/ACT IN AERS: 2.0000 | INHALATION_SPRAY | Freq: Four times a day (QID) | RESPIRATORY_TRACT | Status: DC | PRN

## 2011-08-30 MED ORDER — ATENOLOL-CHLORTHALIDONE 50-25 MG PO TABS: 1.0000 | ORAL_TABLET | Freq: Every day | ORAL | Status: DC

## 2011-08-30 MED ORDER — ATORVASTATIN CALCIUM 20 MG PO TABS: 20.0000 mg | ORAL_TABLET | Freq: Every day | ORAL | Status: DC

## 2011-08-30 MED ORDER — ESOMEPRAZOLE MAGNESIUM 40 MG PO CPDR: 40.0000 mg | DELAYED_RELEASE_CAPSULE | Freq: Every day | ORAL | Status: DC

## 2011-08-30 NOTE — Patient Instructions (Signed)
Continue your current medications  Follow-up in 1 year sooner if any problem 

## 2011-08-30 NOTE — Progress Notes (Signed)
  Subjective:    Patient ID: Alexandria [name], female    DOB: April 30, 1940, 71 y.o.   MRN: [id number]  HPI dottie is a 71 year old married female nonsmoker who comes in today for a Medicare wellness examination  She has a history of underlying asthma and is on albuterol when necessary and Advair 100-50 dose one puff twice a day asymptomatic  She takes Lipitor 20 mg daily and an aspirin tablet for hyperlipidemia  She is on Lexapro by her psychiatrist for depression  She takes a potassium supplement 2 tabs daily for hypokalemia  She takes Nexium 40 mg daily for reflux  She takes Mobic 15 mg from her orthopedist for chronic joint pain  She takes Tenoretic 50-25 daily for hypertension BP 124/80  She takes over-the-counter Claritin and a steroid nasal spray for allergic rhinitis  She also takes Vicodin when necessary for back pain.  She gets routine eye care, dental care, BSE monthly, and you mammography, colonoscopy and GI, tetanus 2003 booster today, Pneumovax x2, shingles 2009.  Cognitive function normal she walks on regular basis home health safety reviewed no issues identified, no guns in the house, she does have a health care power of attorney and living will.  She's currently seeing Dr. Modesto Charon for evaluation and he is concerned about her cerebellar function because of some difficulty with her gait and sensation   Review of Systems  Constitutional: Negative.   HENT: Negative.   Eyes: Negative.   Respiratory: Negative.   Cardiovascular: Negative.   Gastrointestinal: Negative.   Genitourinary: Negative.   Musculoskeletal: Negative.   Neurological: Negative.   Hematological: Negative.   Psychiatric/Behavioral: Negative.        Objective:   Physical Exam  Constitutional: She appears well-developed and well-nourished.  HENT:  Head: Normocephalic and atraumatic.  Right Ear: External ear normal.  Left Ear: External ear normal.  Nose: Nose normal.  Mouth/Throat: Oropharynx is  clear and moist.  Eyes: EOM are normal. Pupils are equal, round, and reactive to light.  Neck: Normal range of motion. Neck supple. No thyromegaly present.  Cardiovascular: Normal rate, regular rhythm, normal heart sounds and intact distal pulses.  Exam reveals no gallop and no friction rub.   No murmur heard. Pulmonary/Chest: Effort normal and breath sounds normal.  Abdominal: Soft. Bowel sounds are normal. She exhibits no distension and no mass. There is no tenderness. There is no rebound.  Genitourinary:       Bilateral breast exam normal except for a cystic lesion right breast 6 inches from the nipple it soft rubbery movable in the 12:00 position no change from previous exams recent mammogram normal  Musculoskeletal: Normal range of motion.  Lymphadenopathy:    She has no cervical adenopathy.  Neurological: She is alert. She has normal reflexes. No cranial nerve deficit. She exhibits normal muscle tone. Coordination normal.  Skin: Skin is warm and dry.  Psychiatric: She has a normal mood and affect. Her behavior is normal. Judgment and thought content normal.          Assessment & Plan:  Healthy female  COPD continue medication  Hyperlipidemia continue Lipitor 20 mg daily and an aspirin tablet  Depression continue Lexapro and followup by psychiatrist  Allergic rhinitis continue over-the-counter Claritin and a steroid nasal spray when necessary  Chronic back pain Vicodin when necessary  Osteoarthritis Motrin 15 mg daily  Reflux esophagitis Nexium 40 mg daily  Hypertension continue Tenoretic  Neurologic symptoms unknown etiology continue followup by Dr. Modesto Charon

## 2011-08-31 ENCOUNTER — Ambulatory Visit (HOSPITAL_COMMUNITY): Admission: RE | Admit: 2011-08-31 | Payer: 59 | Source: Ambulatory Visit

## 2011-08-31 ENCOUNTER — Ambulatory Visit (HOSPITAL_COMMUNITY)
Admission: RE | Admit: 2011-08-31 | Discharge: 2011-08-31 | Disposition: A | Discharge status: Home / Self Care | Payer: Medicare Other | Source: Ambulatory Visit | Attending: Neurology | Admitting: Neurology

## 2011-08-31 ENCOUNTER — Ambulatory Visit (HOSPITAL_COMMUNITY): Payer: 59

## 2011-08-31 DIAGNOSIS — Z8673 Personal history of transient ischemic attack (TIA), and cerebral infarction without residual deficits: Secondary | ICD-10-CM | POA: Insufficient documentation

## 2011-08-31 DIAGNOSIS — R270 Ataxia, unspecified: Secondary | ICD-10-CM

## 2011-08-31 DIAGNOSIS — H5500 Unspecified nystagmus: Secondary | ICD-10-CM | POA: Insufficient documentation

## 2011-08-31 DIAGNOSIS — H9190 Unspecified hearing loss, unspecified ear: Secondary | ICD-10-CM | POA: Insufficient documentation

## 2011-08-31 DIAGNOSIS — G319 Degenerative disease of nervous system, unspecified: Secondary | ICD-10-CM | POA: Insufficient documentation

## 2011-08-31 DIAGNOSIS — Z9181 History of falling: Secondary | ICD-10-CM | POA: Insufficient documentation

## 2011-08-31 DIAGNOSIS — I771 Stricture of artery: Secondary | ICD-10-CM | POA: Insufficient documentation

## 2011-08-31 DIAGNOSIS — R269 Unspecified abnormalities of gait and mobility: Secondary | ICD-10-CM | POA: Insufficient documentation

## 2011-08-31 DIAGNOSIS — I10 Essential (primary) hypertension: Secondary | ICD-10-CM | POA: Insufficient documentation

## 2011-08-31 DIAGNOSIS — H8190 Unspecified disorder of vestibular function, unspecified ear: Secondary | ICD-10-CM

## 2011-08-31 DIAGNOSIS — E785 Hyperlipidemia, unspecified: Secondary | ICD-10-CM | POA: Insufficient documentation

## 2011-08-31 DIAGNOSIS — H9319 Tinnitus, unspecified ear: Secondary | ICD-10-CM | POA: Insufficient documentation

## 2011-08-31 DIAGNOSIS — I639 Cerebral infarction, unspecified: Secondary | ICD-10-CM

## 2011-08-31 DIAGNOSIS — Q283 Other malformations of cerebral vessels: Secondary | ICD-10-CM | POA: Insufficient documentation

## 2011-08-31 MED ORDER — GADOBENATE DIMEGLUMINE 529 MG/ML IV SOLN
12.0000 mL | Freq: Once | INTRAVENOUS | Status: AC | PRN
  Administered 2011-08-31: 12 mL via INTRAVENOUS

## 2011-09-05 ENCOUNTER — Telehealth: Payer: Self-pay | Admitting: Neurology

## 2011-09-05 NOTE — Telephone Encounter (Signed)
Pt would like results of MRI

## 2011-09-06 ENCOUNTER — Telehealth: Payer: Self-pay | Admitting: Neurology

## 2011-09-06 ENCOUNTER — Other Ambulatory Visit: Payer: Self-pay | Admitting: Neurology

## 2011-09-06 DIAGNOSIS — R269 Unspecified abnormalities of gait and mobility: Secondary | ICD-10-CM

## 2011-09-06 DIAGNOSIS — R42 Dizziness and giddiness: Secondary | ICD-10-CM

## 2011-09-06 MED ORDER — CLOPIDOGREL BISULFATE 75 MG PO TABS: 75.0000 mg | ORAL_TABLET | Freq: Every day | ORAL | Status: DC

## 2011-09-06 NOTE — Telephone Encounter (Signed)
of course.  Plavix 75mg  daily. 30 tabs, 6 refills.  Have already sent a note to Dr. Tawanna Cooler about TTE.  Patient should follow up with his office about scheduling it.

## 2011-09-06 NOTE — Telephone Encounter (Signed)
Spoke with Alexandria [name]. Information given as per Dr. Modesto Charon below re: test results. She states she is unable to take ASA as it causes hives. She has never taken Plavix but knows that it would be indicated in her case. She would like to take it given her family history of stroke and cardiac issues. Her pharmacy is [facility]. She is aware of the referral to Neuro rehab on [address] for their balance and fall program. She also wanted Dr. Modesto Charon to know that just yesterday she experienced the pain in her right arm followed by numbness and tingling in her right hand/fingers. She reports she told Dr. Modesto Charon about this occurrence at her last visit. She feels like this is some kind of a "warning sign". This event is precipitated by nothing; is short in duration. She wants to be sure Dr. Tawanna Cooler gets all of this information. **Dr. Modesto Charon, will you prescribe the Plavix? If so, please advise. Also, you stated you were going to recommend Dr. Tawanna Cooler order a TTE. Thanks.

## 2011-09-06 NOTE — Telephone Encounter (Signed)
Spoke with the patient's husband. Aware that the medication Plavix was e-scribed to [facility] and that Dr. Modesto Charon was recommending the patient have a TTE and to f/u with Dr. Tawanna Cooler about that. He states he will pass this information to his wife.

## 2011-09-06 NOTE — Telephone Encounter (Signed)
Message copied by Benay Spice on Tue Sep 06, 2011  1:11 PM ------      Message from: Milas Gain      Created: Mon Sep 05, 2011  2:03 PM       Could you let Ms. Cotton know that her MRI brain did show any old small stroke(infarction) that I don't think it is causing her imbalance problems.  Her blood vessels looked ok. I noted that she is allergic to aspirin - what is her allergy?  Has she ever been on Plavix - as it would be ideal to help prevent further strokes?             I would also like to send her to gait therapy as well.  Finally, I am going to recommend that Dr. Tawanna Cooler get a TTE.

## 2011-09-06 NOTE — Telephone Encounter (Signed)
jan - I think I already sent you the request to call Alexandria [name] about her studies.  Just make sure I am not forgetting something.

## 2011-09-07 ENCOUNTER — Telehealth: Payer: Self-pay | Admitting: Family Medicine

## 2011-09-07 DIAGNOSIS — J45909 Unspecified asthma, uncomplicated: Secondary | ICD-10-CM

## 2011-09-07 DIAGNOSIS — J449 Chronic obstructive pulmonary disease, unspecified: Secondary | ICD-10-CM

## 2011-09-07 NOTE — Telephone Encounter (Signed)
Caller: Alexandria [name]/Patient; Phone Number: [phone]; Message from caller: Patient was refered to Neurolgy last week scans were done.  She was told to follow up with Dr.  Tawanna Cooler.  She saw Dr.  Tawanna Cooler last week dose she need to schedule and appointment or dose the Dr.  Genella Rife to order the test that was recommended.  Did the office received those recommendations.

## 2011-09-09 ENCOUNTER — Other Ambulatory Visit: Payer: Self-pay | Admitting: *Deleted

## 2011-09-09 ENCOUNTER — Ambulatory Visit (INDEPENDENT_AMBULATORY_CARE_PROVIDER_SITE_OTHER): Payer: Medicare Other | Admitting: Licensed Clinical Social Worker

## 2011-09-09 DIAGNOSIS — F4323 Adjustment disorder with mixed anxiety and depressed mood: Secondary | ICD-10-CM

## 2011-09-09 MED ORDER — PANTOPRAZOLE SODIUM 40 MG PO TBEC: 40.0000 mg | DELAYED_RELEASE_TABLET | Freq: Every day | ORAL | Status: DC

## 2011-09-13 NOTE — Telephone Encounter (Signed)
1. Patient would like to know if she should go forward with the TTE? Or should she come in for a follow up appointment?   2. Chest x--ray ordered for hx of asthma and COPD

## 2011-09-13 NOTE — Telephone Encounter (Signed)
Pt called req call back re: previous phone note. Also wanted to talk to [facility] about chest xray. Pls call.

## 2011-09-17 NOTE — Telephone Encounter (Signed)
Follow dr Maurice March advise

## 2011-09-19 ENCOUNTER — Ambulatory Visit (INDEPENDENT_AMBULATORY_CARE_PROVIDER_SITE_OTHER)
Admission: RE | Admit: 2011-09-19 | Discharge: 2011-09-19 | Disposition: A | Discharge status: Home / Self Care | Payer: Medicare Other | Source: Ambulatory Visit | Attending: Family Medicine | Admitting: Family Medicine

## 2011-09-19 DIAGNOSIS — J449 Chronic obstructive pulmonary disease, unspecified: Secondary | ICD-10-CM

## 2011-09-19 DIAGNOSIS — J45909 Unspecified asthma, uncomplicated: Secondary | ICD-10-CM

## 2011-09-22 ENCOUNTER — Ambulatory Visit (INDEPENDENT_AMBULATORY_CARE_PROVIDER_SITE_OTHER): Payer: Medicare Other | Admitting: Licensed Clinical Social Worker

## 2011-09-22 DIAGNOSIS — F4323 Adjustment disorder with mixed anxiety and depressed mood: Secondary | ICD-10-CM

## 2011-09-27 ENCOUNTER — Ambulatory Visit: Payer: Medicare Other | Attending: Neurology | Admitting: Physical Therapy

## 2011-09-27 DIAGNOSIS — R42 Dizziness and giddiness: Secondary | ICD-10-CM | POA: Insufficient documentation

## 2011-09-27 DIAGNOSIS — R269 Unspecified abnormalities of gait and mobility: Secondary | ICD-10-CM | POA: Insufficient documentation

## 2011-09-27 DIAGNOSIS — IMO0001 Reserved for inherently not codable concepts without codable children: Secondary | ICD-10-CM | POA: Insufficient documentation

## 2011-09-29 ENCOUNTER — Ambulatory Visit (INDEPENDENT_AMBULATORY_CARE_PROVIDER_SITE_OTHER): Payer: Medicare Other | Admitting: Licensed Clinical Social Worker

## 2011-09-29 DIAGNOSIS — F4323 Adjustment disorder with mixed anxiety and depressed mood: Secondary | ICD-10-CM

## 2011-10-05 ENCOUNTER — Ambulatory Visit: Payer: Medicare Other | Admitting: Physical Therapy

## 2011-10-07 ENCOUNTER — Ambulatory Visit (INDEPENDENT_AMBULATORY_CARE_PROVIDER_SITE_OTHER): Payer: Medicare Other | Admitting: Licensed Clinical Social Worker

## 2011-10-07 ENCOUNTER — Ambulatory Visit: Payer: Medicare Other | Admitting: Physical Therapy

## 2011-10-07 DIAGNOSIS — F4323 Adjustment disorder with mixed anxiety and depressed mood: Secondary | ICD-10-CM

## 2011-10-11 ENCOUNTER — Ambulatory Visit: Payer: Medicare Other | Admitting: Physical Therapy

## 2011-10-13 ENCOUNTER — Encounter: Payer: Self-pay | Admitting: Neurology

## 2011-10-13 ENCOUNTER — Encounter: Payer: Self-pay | Admitting: Gastroenterology

## 2011-10-13 ENCOUNTER — Ambulatory Visit: Payer: Medicare Other | Attending: Neurology | Admitting: Physical Therapy

## 2011-10-13 ENCOUNTER — Ambulatory Visit (INDEPENDENT_AMBULATORY_CARE_PROVIDER_SITE_OTHER): Payer: Medicare Other | Admitting: Neurology

## 2011-10-13 VITALS — BP 128/80 | HR 56 | Wt 134.0 lb

## 2011-10-13 DIAGNOSIS — R269 Unspecified abnormalities of gait and mobility: Secondary | ICD-10-CM | POA: Insufficient documentation

## 2011-10-13 DIAGNOSIS — I639 Cerebral infarction, unspecified: Secondary | ICD-10-CM

## 2011-10-13 DIAGNOSIS — IMO0001 Reserved for inherently not codable concepts without codable children: Secondary | ICD-10-CM | POA: Insufficient documentation

## 2011-10-13 DIAGNOSIS — R42 Dizziness and giddiness: Secondary | ICD-10-CM | POA: Insufficient documentation

## 2011-10-13 DIAGNOSIS — I6350 Cerebral infarction due to unspecified occlusion or stenosis of unspecified cerebral artery: Secondary | ICD-10-CM

## 2011-10-13 NOTE — Progress Notes (Signed)
Dear Dr. Tawanna Cooler,  I saw  Alexandria [name] back in [facility] for her problem with falls and gait ataxia.  As you may recall, she is a 71 y.o. year old female with a history of anxiety, depression, hypertension, hyperlipidemia and hearing loss who has had a worsening problem with gait since a fall in January where she hit her head.  She was not knocked unconscious, but did have increased "dizziness" after the fall and emotional lability.  She has had multiple falls since then. She complains of difficulty with vision when she moves her head.  At her last visit, I felt that her problem was likely cerebellar or peripheral vestibular.  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  Otherwise it was unremarkable.  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  She has not had a TTE.  I instructed her to start clopidogrel as she is allergic to aspirin.  However, for the dizziness and difficulty walking -- which we don't have an explanation for - I sent her to gait and vestibular therapy.  She thinks she is improving overall. It sounds like from posturography her vestibular function is impaired.  She still has problems with quick head movements making her vision blurry.  She has had no falls however.    Medical history, social history, and family history were reviewed and have not changed since the last clinic visit.  Current Outpatient Prescriptions on File Prior to Visit  Medication Sig Dispense Refill  . albuterol (PROVENTIL HFA;VENTOLIN HFA) 108 (90 BASE) MCG/ACT inhaler Inhale 2 puffs into the lungs every 6 (six) hours as needed. For shortness of breath and wheezing  18 g  3  . atenolol-chlorthalidone (TENORETIC 50) 50-25 MG per tablet Take 1 tablet by mouth daily.  100 tablet  3  . atorvastatin (LIPITOR) 20 MG tablet Take 1 tablet (20 mg total) by mouth daily. Twice weekly   100 tablet  3  . b complex vitamins tablet Take 1 tablet by mouth daily.        . Cholecalciferol (VITAMIN D-3) 5000 UNITS TABS Take 5,000 Units by mouth daily.      . clopidogrel (PLAVIX) 75 MG tablet Take 1 tablet (75 mg total) by mouth daily.  30 tablet  6  . cyclobenzaprine (FLEXERIL) 5 MG tablet Take 5 mg by mouth 3 (three) times daily as needed. For muscle spasms      . escitalopram (LEXAPRO) 10 MG tablet Take 10 mg by mouth. 1/2 - 1 at bedtime      . fluticasone (FLONASE) 50 MCG/ACT nasal spray Place 2 sprays into the nose daily.  16 g  11  . Fluticasone-Salmeterol (ADVAIR DISKUS) 100-50 MCG/DOSE AEPB Inhale 1 puff into the lungs 2 (two) times daily at 10 AM and 5 PM.  60 each  11  . glucosamine-chondroitin 500-400 MG tablet Take 1 tablet by mouth 3 (three) times daily.        Marland Kitchen HYDROcodone-acetaminophen (VICODIN) 5-500 MG per tablet Take 1 tablet by mouth every 6 (six) hours as needed. For pain      . K-DUR 20 MEQ tablet TAKE 3 TABLETS DAILY.  102 each  1  . loratadine (CLARITIN) 10 MG tablet Take 10 mg by mouth daily.        . meloxicam (MOBIC) 15 MG tablet Take 2 tablets (30 mg total)  by mouth daily.  100 tablet  3  . pantoprazole (PROTONIX) 40 MG tablet Take 1 tablet (40 mg total) by mouth daily.  30 tablet  3  . Probiotic Product (ALIGN PO) Take 1 tablet by mouth daily.       Marland Kitchen selenium 50 MCG TABS Take 50 mcg by mouth daily.        Marland Kitchen ZOFRAN 4 MG tablet TAKE  (1)  TABLET  EVERY FOUR HOURS AS NEEDED FOR NAUSEA.  10 each  1    Allergies  Allergen Reactions  . Aspirin Hives  . Augmentin (Amoxicillin-Pot Clavulanate) Itching    Hands break out  . Chocolate Hives    Runny nose   . Coffee Bean Extract Hives    Runny nose   . Ibuprofen Hives    Runny nose  . Oxycodone Hcl Hives  . Penicillins Hives  . Shrimp (Shellfish Allergy) Hives    Runny nose   . Sulfonamide Derivatives Hives    ROS:  13 systems were reviewed and are notable for decreased hearing on the left.  All  other review of systems are unremarkable.  Exam: . Filed Vitals:   10/13/11 0831  BP: 128/80  Pulse: 56  Weight: 134 lb (60.782 kg)    In general, well appearing women.  Mental status:   The patient is oriented to person, place and time. Recent and remote memory are intact. Attention span and concentration are normal. Language including repetition, naming, following commands are intact. Fund of knowledge of current and historical events, as well as vocabulary are normal.  Cranial Nerves: Pupils are equally round and reactive to light. Visual fields full to confrontation.EOMs reveal nystagmus worse on left gaze.   Facial sensation and muscles of mastication are intact. Muscles of facial expression reveal mild involuntary right facial droop.Tongue protrusion, uvula, palate midline.  Shoulder shrug intact  Motor:  Normal bulk and tone, no drift and 5/5 muscle strength bilaterally.  Reflexes:  3+ thoughout, except 2+ at ankles.  Coordination:  Normal finger to nose.  No significant past pointing with eyes closed.  No clear titubation.  Gait:  Mildly wide based.  Romberg negative but she clearly sways.  Impression/Recommendations:  1.  Gait ataxia - I think her problem now is mainly vestibular and given the additional history I think it is likely from a concussion when she had her fall in January.  I think vestibular and gait therapy are her best bets of improvement.  On exam she has clearly improved. 2.  Ischemic stroke - her area of infarction is very small and I am not even sure it was ever symptomatic.  It certainly does not explain her current symptoms.  Her blood vessels look quite good.  I have recommended the use of Plavix as she cannot take aspirin.  Despite some evidence for increased risk of bleeding with SSRIs I don't think this should prevent use of either. 3.  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  I would not necessarily follow this given how small it is.   However, if the patient wants a repeat scan in 3 years would suffice.  Unfortunately the patient will have to follow up with you as I will be leaving for [facility] in September.   Lupita Raider Modesto Charon, MD [facility], [place]

## 2011-10-14 ENCOUNTER — Telehealth: Payer: Self-pay | Admitting: Neurology

## 2011-10-14 ENCOUNTER — Ambulatory Visit (INDEPENDENT_AMBULATORY_CARE_PROVIDER_SITE_OTHER): Payer: Medicare Other | Admitting: Licensed Clinical Social Worker

## 2011-10-14 DIAGNOSIS — F4323 Adjustment disorder with mixed anxiety and depressed mood: Secondary | ICD-10-CM

## 2011-10-14 NOTE — Telephone Encounter (Signed)
Spoke with Alexandria [name]. Information given as written below by Dr. Modesto Charon. No additional questions or concerns at this time.

## 2011-10-14 NOTE — Telephone Encounter (Signed)
Message copied by Benay Spice on Fri Oct 14, 2011  4:08 PM ------      Message from: Milas Gain      Created: Fri Oct 14, 2011 12:39 PM       Let Ms. Cotton know that while there is a slight increased bleeding risk with plavix and lexapro I don't think it prohibits lexapro's use especially if she plans on tapering off of it at a later time.

## 2011-10-17 ENCOUNTER — Ambulatory Visit: Payer: Medicare Other | Admitting: Physical Therapy

## 2011-10-18 ENCOUNTER — Ambulatory Visit (HOSPITAL_COMMUNITY): Payer: 59 | Attending: Neurology

## 2011-10-19 ENCOUNTER — Ambulatory Visit: Payer: Medicare Other | Admitting: Physical Therapy

## 2011-10-25 ENCOUNTER — Encounter: Payer: 59 | Admitting: Physical Therapy

## 2011-10-27 ENCOUNTER — Encounter: Payer: 59 | Admitting: Physical Therapy

## 2011-10-28 ENCOUNTER — Ambulatory Visit (INDEPENDENT_AMBULATORY_CARE_PROVIDER_SITE_OTHER): Payer: Medicare Other | Admitting: Licensed Clinical Social Worker

## 2011-10-28 DIAGNOSIS — F4323 Adjustment disorder with mixed anxiety and depressed mood: Secondary | ICD-10-CM

## 2011-11-02 ENCOUNTER — Ambulatory Visit: Payer: Medicare Other | Admitting: Physical Therapy

## 2011-11-04 ENCOUNTER — Ambulatory Visit: Payer: Medicare Other | Admitting: Physical Therapy

## 2011-11-04 ENCOUNTER — Ambulatory Visit (INDEPENDENT_AMBULATORY_CARE_PROVIDER_SITE_OTHER): Payer: Medicare Other | Admitting: Licensed Clinical Social Worker

## 2011-11-04 DIAGNOSIS — F4323 Adjustment disorder with mixed anxiety and depressed mood: Secondary | ICD-10-CM

## 2011-11-18 ENCOUNTER — Ambulatory Visit (INDEPENDENT_AMBULATORY_CARE_PROVIDER_SITE_OTHER): Payer: Medicare Other | Admitting: Licensed Clinical Social Worker

## 2011-11-18 DIAGNOSIS — F4323 Adjustment disorder with mixed anxiety and depressed mood: Secondary | ICD-10-CM

## 2011-12-01 ENCOUNTER — Encounter: Payer: Self-pay | Admitting: Internal Medicine

## 2011-12-02 ENCOUNTER — Ambulatory Visit (INDEPENDENT_AMBULATORY_CARE_PROVIDER_SITE_OTHER): Payer: Medicare Other | Admitting: Licensed Clinical Social Worker

## 2011-12-02 DIAGNOSIS — F4323 Adjustment disorder with mixed anxiety and depressed mood: Secondary | ICD-10-CM

## 2011-12-08 ENCOUNTER — Other Ambulatory Visit: Payer: Self-pay | Admitting: Obstetrics and Gynecology

## 2011-12-08 DIAGNOSIS — N63 Unspecified lump in unspecified breast: Secondary | ICD-10-CM

## 2011-12-12 ENCOUNTER — Ambulatory Visit
Admission: RE | Admit: 2011-12-12 | Discharge: 2011-12-12 | Disposition: A | Discharge status: Home / Self Care | Payer: 59 | Source: Ambulatory Visit | Attending: Obstetrics and Gynecology | Admitting: Obstetrics and Gynecology

## 2011-12-12 ENCOUNTER — Other Ambulatory Visit: Payer: Self-pay | Admitting: Obstetrics and Gynecology

## 2011-12-12 DIAGNOSIS — N63 Unspecified lump in unspecified breast: Secondary | ICD-10-CM

## 2011-12-13 DIAGNOSIS — C50919 Malignant neoplasm of unspecified site of unspecified female breast: Secondary | ICD-10-CM | POA: Insufficient documentation

## 2011-12-13 HISTORY — DX: Malignant neoplasm of unspecified site of unspecified female breast: C50.919

## 2011-12-19 ENCOUNTER — Other Ambulatory Visit: Payer: Self-pay

## 2011-12-19 MED ORDER — POTASSIUM CHLORIDE CRYS ER 20 MEQ PO TBCR: EXTENDED_RELEASE_TABLET | ORAL | Status: DC

## 2011-12-19 NOTE — Telephone Encounter (Signed)
Rx request for potassium sent to pharmacy.

## 2011-12-20 ENCOUNTER — Ambulatory Visit
Admission: RE | Admit: 2011-12-20 | Discharge: 2011-12-20 | Disposition: A | Discharge status: Home / Self Care | Payer: Medicare Other | Source: Ambulatory Visit | Attending: Obstetrics and Gynecology | Admitting: Obstetrics and Gynecology

## 2011-12-20 ENCOUNTER — Other Ambulatory Visit: Payer: Self-pay | Admitting: Obstetrics and Gynecology

## 2011-12-20 DIAGNOSIS — N63 Unspecified lump in unspecified breast: Secondary | ICD-10-CM

## 2011-12-22 ENCOUNTER — Other Ambulatory Visit: Payer: Self-pay | Admitting: Obstetrics and Gynecology

## 2011-12-22 ENCOUNTER — Ambulatory Visit (INDEPENDENT_AMBULATORY_CARE_PROVIDER_SITE_OTHER): Payer: Medicare Other | Admitting: Licensed Clinical Social Worker

## 2011-12-22 DIAGNOSIS — F4323 Adjustment disorder with mixed anxiety and depressed mood: Secondary | ICD-10-CM

## 2011-12-22 DIAGNOSIS — C50911 Malignant neoplasm of unspecified site of right female breast: Secondary | ICD-10-CM

## 2011-12-23 ENCOUNTER — Telehealth: Payer: Self-pay | Admitting: *Deleted

## 2011-12-23 DIAGNOSIS — C50211 Malignant neoplasm of upper-inner quadrant of right female breast: Secondary | ICD-10-CM | POA: Insufficient documentation

## 2011-12-23 DIAGNOSIS — C50219 Malignant neoplasm of upper-inner quadrant of unspecified female breast: Secondary | ICD-10-CM

## 2011-12-23 NOTE — Telephone Encounter (Signed)
Confirmed [facility] for 12/28/11 at 0830 .  Instructions and contact information given.

## 2011-12-26 ENCOUNTER — Ambulatory Visit
Admission: RE | Admit: 2011-12-26 | Discharge: 2011-12-26 | Disposition: A | Discharge status: Home / Self Care | Payer: Medicare Other | Source: Ambulatory Visit | Attending: Obstetrics and Gynecology | Admitting: Obstetrics and Gynecology

## 2011-12-26 ENCOUNTER — Telehealth: Payer: Self-pay | Admitting: Family Medicine

## 2011-12-26 DIAGNOSIS — C50911 Malignant neoplasm of unspecified site of right female breast: Secondary | ICD-10-CM

## 2011-12-26 MED ORDER — GADOBENATE DIMEGLUMINE 529 MG/ML IV SOLN
12.0000 mL | Freq: Once | INTRAVENOUS | Status: AC | PRN
  Administered 2011-12-26: 12 mL via INTRAVENOUS

## 2011-12-26 NOTE — Telephone Encounter (Signed)
Pt hus is requesting MD to call his wife after 5 pm. Pt was dx with cancer

## 2011-12-28 ENCOUNTER — Encounter: Payer: Self-pay | Admitting: Oncology

## 2011-12-28 ENCOUNTER — Other Ambulatory Visit (HOSPITAL_BASED_OUTPATIENT_CLINIC_OR_DEPARTMENT_OTHER): Payer: Medicare Other | Admitting: Lab

## 2011-12-28 ENCOUNTER — Encounter: Payer: Medicare Other | Admitting: Genetic Counselor

## 2011-12-28 ENCOUNTER — Ambulatory Visit: Payer: Medicare Other

## 2011-12-28 ENCOUNTER — Encounter: Payer: Self-pay | Admitting: *Deleted

## 2011-12-28 ENCOUNTER — Encounter (INDEPENDENT_AMBULATORY_CARE_PROVIDER_SITE_OTHER): Payer: Self-pay | Admitting: General Surgery

## 2011-12-28 ENCOUNTER — Ambulatory Visit (HOSPITAL_BASED_OUTPATIENT_CLINIC_OR_DEPARTMENT_OTHER): Payer: Medicare Other | Admitting: Oncology

## 2011-12-28 ENCOUNTER — Ambulatory Visit
Admission: RE | Admit: 2011-12-28 | Discharge: 2011-12-28 | Disposition: A | Discharge status: Home / Self Care | Payer: Medicare Other | Source: Ambulatory Visit | Attending: Radiation Oncology | Admitting: Radiation Oncology

## 2011-12-28 ENCOUNTER — Ambulatory Visit: Payer: Medicare Other | Attending: General Surgery | Admitting: Physical Therapy

## 2011-12-28 ENCOUNTER — Ambulatory Visit (HOSPITAL_BASED_OUTPATIENT_CLINIC_OR_DEPARTMENT_OTHER): Payer: Medicare Other | Admitting: General Surgery

## 2011-12-28 VITALS — BP 119/74 | HR 50 | Temp 99.0°F | Resp 20 | Ht 63.5 in | Wt 138.5 lb

## 2011-12-28 DIAGNOSIS — C50219 Malignant neoplasm of upper-inner quadrant of unspecified female breast: Secondary | ICD-10-CM

## 2011-12-28 DIAGNOSIS — IMO0001 Reserved for inherently not codable concepts without codable children: Secondary | ICD-10-CM | POA: Insufficient documentation

## 2011-12-28 DIAGNOSIS — M25619 Stiffness of unspecified shoulder, not elsewhere classified: Secondary | ICD-10-CM | POA: Insufficient documentation

## 2011-12-28 DIAGNOSIS — R293 Abnormal posture: Secondary | ICD-10-CM | POA: Insufficient documentation

## 2011-12-28 DIAGNOSIS — C50919 Malignant neoplasm of unspecified site of unspecified female breast: Secondary | ICD-10-CM | POA: Insufficient documentation

## 2011-12-28 DIAGNOSIS — M25519 Pain in unspecified shoulder: Secondary | ICD-10-CM | POA: Insufficient documentation

## 2011-12-28 LAB — COMPREHENSIVE METABOLIC PANEL (CC13)
Albumin: 3.9 g/dL (ref 3.5–5.0)
CO2: 28 mEq/L (ref 22–29)
Calcium: 9.3 mg/dL (ref 8.4–10.4)
Glucose: 82 mg/dl (ref 70–99)
Potassium: 3.6 mEq/L (ref 3.5–5.1)
Sodium: 142 mEq/L (ref 136–145)
Total Protein: 6.6 g/dL (ref 6.4–8.3)

## 2011-12-28 LAB — CBC WITH DIFFERENTIAL/PLATELET
Eosinophils Absolute: 0.1 10*3/uL (ref 0.0–0.5)
HGB: 12.4 g/dL (ref 11.6–15.9)
MONO#: 0.5 10*3/uL (ref 0.1–0.9)
NEUT#: 2.4 10*3/uL (ref 1.5–6.5)
Platelets: 177 10*3/uL (ref 145–400)
RBC: 4 10*6/uL (ref 3.70–5.45)
RDW: 13.1 % (ref 11.2–14.5)
WBC: 5.5 10*3/uL (ref 3.9–10.3)
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
## 2011-12-28 NOTE — Progress Notes (Signed)
Checked in new pt with no financial concerns. °
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
## 2011-12-28 NOTE — Addendum Note (Signed)
Addended byEmelia Loron on: 12/28/2011 01:09 PM   Modules accepted: Orders

## 2011-12-28 NOTE — Progress Notes (Signed)
[facility] Psychosocial Distress Screening Clinical Social Work  Patient completed distress screening protocol, and scored a 4 on the Psychosocial Distress Thermometer which indicates mild distress. Clinical Social Worker met with patient in [facility] to assess for distress and other psychosocial needs.  Pt is the primary caregiver for her husband who has multiple medical issues.  Pt stated this was the cause of most of her distress.  Pt does have adult children who live in the area, and also provide support to the pt and care of her husband.  CSw informed pt of the support team and support services at [facility], and pt was agreeable to a reach to recovery referral.  Pt also expressed interest in other support programs at [facility].  CSW encouraged pt to call with any questions or concerns.        Clinical Social Worker follow up needed:not at this time  Tamala Julian, MSW, LCSW Clinical Social Worker [facility] [phone]

## 2011-12-29 ENCOUNTER — Other Ambulatory Visit (INDEPENDENT_AMBULATORY_CARE_PROVIDER_SITE_OTHER): Payer: Self-pay | Admitting: General Surgery

## 2011-12-29 ENCOUNTER — Encounter (HOSPITAL_BASED_OUTPATIENT_CLINIC_OR_DEPARTMENT_OTHER): Payer: Self-pay | Admitting: *Deleted

## 2011-12-29 DIAGNOSIS — C50219 Malignant neoplasm of upper-inner quadrant of unspecified female breast: Secondary | ICD-10-CM

## 2011-12-29 NOTE — Pre-Procedure Instructions (Signed)
To come for BMET 

## 2011-12-30 ENCOUNTER — Encounter (HOSPITAL_BASED_OUTPATIENT_CLINIC_OR_DEPARTMENT_OTHER)
Admission: RE | Admit: 2011-12-30 | Discharge: 2011-12-30 | Disposition: A | Discharge status: Home / Self Care | Payer: Medicare Other | Source: Ambulatory Visit | Attending: General Surgery | Admitting: General Surgery

## 2011-12-30 ENCOUNTER — Encounter (INDEPENDENT_AMBULATORY_CARE_PROVIDER_SITE_OTHER): Payer: Self-pay

## 2011-12-30 ENCOUNTER — Ambulatory Visit (INDEPENDENT_AMBULATORY_CARE_PROVIDER_SITE_OTHER): Payer: Medicare Other | Admitting: *Deleted

## 2011-12-30 ENCOUNTER — Telehealth (INDEPENDENT_AMBULATORY_CARE_PROVIDER_SITE_OTHER): Payer: Self-pay

## 2011-12-30 DIAGNOSIS — Z23 Encounter for immunization: Secondary | ICD-10-CM

## 2011-12-30 LAB — BASIC METABOLIC PANEL
Chloride: 102 mEq/L (ref 96–112)
Creatinine, Ser: 0.86 mg/dL (ref 0.50–1.10)
GFR calc Af Amer: 78 mL/min — ABNORMAL LOW (ref >90)
GFR calc non Af Amer: 67 mL/min — ABNORMAL LOW (ref >90)
Potassium: 3.8 mEq/L (ref 3.5–5.1)

## 2011-12-30 NOTE — Telephone Encounter (Signed)
Patient called to give Dr. Dwain Sarna an update on family cancer history that she just found out about. See below: 1. Paternal Aunt breast cancer with bilateral masty 2. 2 Paternal Aunt breast cancer with lumpectomy 3. Cousin beast cancer 4. Paternal Uncle with breast cancer 5. 5 other family members with other cancer on fathers side  She has genetic counseling on Monday 01/02/12.  I updated this in her history.

## 2012-01-02 ENCOUNTER — Ambulatory Visit (HOSPITAL_BASED_OUTPATIENT_CLINIC_OR_DEPARTMENT_OTHER): Payer: Medicare Other | Admitting: Genetic Counselor

## 2012-01-02 ENCOUNTER — Encounter: Payer: Self-pay | Admitting: Genetic Counselor

## 2012-01-02 ENCOUNTER — Other Ambulatory Visit: Payer: Medicare Other | Admitting: Lab

## 2012-01-02 DIAGNOSIS — IMO0002 Reserved for concepts with insufficient information to code with codable children: Secondary | ICD-10-CM

## 2012-01-02 DIAGNOSIS — C50219 Malignant neoplasm of upper-inner quadrant of unspecified female breast: Secondary | ICD-10-CM

## 2012-01-02 DIAGNOSIS — Z803 Family history of malignant neoplasm of breast: Secondary | ICD-10-CM
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
## 2012-01-03 ENCOUNTER — Telehealth (INDEPENDENT_AMBULATORY_CARE_PROVIDER_SITE_OTHER): Payer: Self-pay | Admitting: General Surgery

## 2012-01-03 NOTE — Telephone Encounter (Signed)
Spoke with pt and informed her that Dr. Dwain Sarna said that she could wait to do her lumpectomy in a couple weeks after we receive her genetics or we can still proceed with surgery tomorrow.  She said that she would prefer to continue with surgery tomorrow and just readjust if her genetics show anything later.  I informed that this was okay.

## 2012-01-03 NOTE — Telephone Encounter (Signed)
Alexandria [name] I have her scheduled tomorrow. We could move this a couple weeks to wait to get her genetic results back if she wants or go ahead tomorrow.  Would you call and touch base with her. MW

## 2012-01-04 ENCOUNTER — Ambulatory Visit
Admission: RE | Admit: 2012-01-04 | Discharge: 2012-01-04 | Disposition: A | Discharge status: Home / Self Care | Payer: Medicare Other | Source: Ambulatory Visit | Attending: General Surgery | Admitting: General Surgery

## 2012-01-04 ENCOUNTER — Encounter (HOSPITAL_COMMUNITY)
Admission: RE | Admit: 2012-01-04 | Discharge: 2012-01-04 | Disposition: A | Discharge status: Home / Self Care | Payer: Medicare Other | Source: Ambulatory Visit | Attending: General Surgery | Admitting: General Surgery

## 2012-01-04 ENCOUNTER — Encounter (HOSPITAL_BASED_OUTPATIENT_CLINIC_OR_DEPARTMENT_OTHER): Payer: Self-pay | Admitting: *Deleted

## 2012-01-04 ENCOUNTER — Encounter (HOSPITAL_BASED_OUTPATIENT_CLINIC_OR_DEPARTMENT_OTHER)
Admission: RE | Disposition: A | Discharge status: Home / Self Care | Payer: Self-pay | Source: Ambulatory Visit | Attending: General Surgery

## 2012-01-04 ENCOUNTER — Encounter (HOSPITAL_BASED_OUTPATIENT_CLINIC_OR_DEPARTMENT_OTHER): Payer: Self-pay | Admitting: Anesthesiology

## 2012-01-04 ENCOUNTER — Ambulatory Visit (HOSPITAL_BASED_OUTPATIENT_CLINIC_OR_DEPARTMENT_OTHER): Payer: Medicare Other | Admitting: Anesthesiology

## 2012-01-04 ENCOUNTER — Ambulatory Visit (HOSPITAL_BASED_OUTPATIENT_CLINIC_OR_DEPARTMENT_OTHER)
Admission: RE | Admit: 2012-01-04 | Discharge: 2012-01-04 | Disposition: A | Discharge status: Home / Self Care | Payer: Medicare Other | Source: Ambulatory Visit | Attending: General Surgery | Admitting: General Surgery

## 2012-01-04 DIAGNOSIS — C50219 Malignant neoplasm of upper-inner quadrant of unspecified female breast: Secondary | ICD-10-CM

## 2012-01-04 DIAGNOSIS — K219 Gastro-esophageal reflux disease without esophagitis: Secondary | ICD-10-CM | POA: Insufficient documentation

## 2012-01-04 DIAGNOSIS — D0590 Unspecified type of carcinoma in situ of unspecified breast: Secondary | ICD-10-CM

## 2012-01-04 DIAGNOSIS — C50919 Malignant neoplasm of unspecified site of unspecified female breast: Secondary | ICD-10-CM | POA: Insufficient documentation

## 2012-01-04 DIAGNOSIS — J449 Chronic obstructive pulmonary disease, unspecified: Secondary | ICD-10-CM | POA: Insufficient documentation

## 2012-01-04 DIAGNOSIS — I10 Essential (primary) hypertension: Secondary | ICD-10-CM | POA: Insufficient documentation

## 2012-01-04 DIAGNOSIS — Z01812 Encounter for preprocedural laboratory examination: Secondary | ICD-10-CM | POA: Insufficient documentation

## 2012-01-04 DIAGNOSIS — J4489 Other specified chronic obstructive pulmonary disease: Secondary | ICD-10-CM | POA: Insufficient documentation

## 2012-01-04 HISTORY — DX: Unspecified osteoarthritis, unspecified site: M19.90

## 2012-01-04 HISTORY — DX: Depression, unspecified: F32.A

## 2012-01-04 HISTORY — DX: Palpitations: R00.2

## 2012-01-04 HISTORY — DX: Personal history of urinary calculi: Z87.442

## 2012-01-04 HISTORY — DX: Major depressive disorder, single episode, unspecified: F32.9

## 2012-01-04 HISTORY — DX: Spinal stenosis, site unspecified: M48.00

## 2012-01-04 HISTORY — DX: Personal history of other diseases of urinary system: Z87.448

## 2012-01-04 HISTORY — DX: Anxiety disorder, unspecified: F41.9

## 2012-01-04 HISTORY — DX: Malignant neoplasm of unspecified site of unspecified female breast: C50.919

## 2012-01-04 HISTORY — DX: Traumatic hemopneumothorax, initial encounter: S27.2XXA

## 2012-01-04 SURGERY — BREAST LUMPECTOMY WITH NEEDLE LOCALIZATION AND AXILLARY SENTINEL LYMPH NODE BX
Anesthesia: General | Site: Breast | Laterality: Right | Wound class: Clean

## 2012-01-04 MED ORDER — BUPIVACAINE HCL (PF) 0.25 % IJ SOLN
INTRAMUSCULAR | Status: DC | PRN
  Administered 2012-01-04: 15 mL

## 2012-01-04 MED ORDER — HYDROCODONE-ACETAMINOPHEN 10-325 MG PO TABS: 1.0000 | ORAL_TABLET | Freq: Four times a day (QID) | ORAL | Status: AC | PRN

## 2012-01-04 MED ORDER — SCOPOLAMINE 1 MG/3DAYS TD PT72
1.0000 | MEDICATED_PATCH | TRANSDERMAL | Status: DC
  Administered 2012-01-04: 1.5 mg via TRANSDERMAL

## 2012-01-04 MED ORDER — ACETAMINOPHEN 10 MG/ML IV SOLN
1000.0000 mg | Freq: Once | INTRAVENOUS | Status: AC
  Administered 2012-01-04: 1000 mg via INTRAVENOUS

## 2012-01-04 MED ORDER — CIPROFLOXACIN IN D5W 400 MG/200ML IV SOLN
400.0000 mg | INTRAVENOUS | Status: AC
  Administered 2012-01-04: 400 mg via INTRAVENOUS

## 2012-01-04 MED ORDER — LIDOCAINE HCL (CARDIAC) 20 MG/ML IV SOLN
INTRAVENOUS | Status: DC | PRN
  Administered 2012-01-04: 60 mg via INTRAVENOUS

## 2012-01-04 MED ORDER — MIDAZOLAM HCL 2 MG/2ML IJ SOLN
0.5000 mg | INTRAMUSCULAR | Status: DC | PRN
  Administered 2012-01-04: 1 mg via INTRAVENOUS

## 2012-01-04 MED ORDER — LACTATED RINGERS IV SOLN
INTRAVENOUS | Status: DC
  Administered 2012-01-04 (×2): via INTRAVENOUS

## 2012-01-04 MED ORDER — DEXAMETHASONE SODIUM PHOSPHATE 4 MG/ML IJ SOLN
INTRAMUSCULAR | Status: DC | PRN
  Administered 2012-01-04: 10 mg via INTRAVENOUS

## 2012-01-04 MED ORDER — FENTANYL CITRATE 0.05 MG/ML IJ SOLN
INTRAMUSCULAR | Status: DC | PRN
  Administered 2012-01-04: 50 ug via INTRAVENOUS
  Administered 2012-01-04 (×2): 25 ug via INTRAVENOUS

## 2012-01-04 MED ORDER — FENTANYL CITRATE 0.05 MG/ML IJ SOLN
50.0000 ug | INTRAMUSCULAR | Status: DC | PRN
  Administered 2012-01-04: 100 ug via INTRAVENOUS

## 2012-01-04 MED ORDER — PROPOFOL 10 MG/ML IV BOLUS
INTRAVENOUS | Status: DC | PRN
  Administered 2012-01-04: 150 mg via INTRAVENOUS

## 2012-01-04 MED ORDER — TECHNETIUM TC 99M SULFUR COLLOID FILTERED
1.0000 | Freq: Once | INTRAVENOUS | Status: AC | PRN
  Administered 2012-01-04: 1 via INTRADERMAL

## 2012-01-04 MED ORDER — ONDANSETRON HCL 4 MG/2ML IJ SOLN
INTRAMUSCULAR | Status: DC | PRN
  Administered 2012-01-04: 4 mg via INTRAVENOUS

## 2012-01-04 MED ORDER — HYDROMORPHONE HCL PF 1 MG/ML IJ SOLN
0.2500 mg | INTRAMUSCULAR | Status: DC | PRN
  Administered 2012-01-04: 0.25 mg via INTRAVENOUS
  Administered 2012-01-04: 0.5 mg via INTRAVENOUS
  Administered 2012-01-04: 0.25 mg via INTRAVENOUS

## 2012-01-04 MED ORDER — EPHEDRINE SULFATE 50 MG/ML IJ SOLN
INTRAMUSCULAR | Status: DC | PRN
  Administered 2012-01-04 (×2): 10 mg via INTRAVENOUS

## 2012-01-04 SURGICAL SUPPLY — 63 items
APPLIER CLIP 9.375 MED OPEN (MISCELLANEOUS) ×2
BENZOIN TINCTURE PRP APPL 2/3 (GAUZE/BANDAGES/DRESSINGS) IMPLANT
BINDER BREAST LRG (GAUZE/BANDAGES/DRESSINGS) ×2 IMPLANT
BINDER BREAST MEDIUM (GAUZE/BANDAGES/DRESSINGS) IMPLANT
BINDER BREAST XLRG (GAUZE/BANDAGES/DRESSINGS) IMPLANT
BINDER BREAST XXLRG (GAUZE/BANDAGES/DRESSINGS) IMPLANT
BLADE SURG 15 STRL LF DISP TIS (BLADE) ×1 IMPLANT
BLADE SURG 15 STRL SS (BLADE) ×1
BNDG COHESIVE 4X5 TAN STRL (GAUZE/BANDAGES/DRESSINGS) IMPLANT
CANISTER SUCTION 1200CC (MISCELLANEOUS) ×2 IMPLANT
CHLORAPREP W/TINT 26ML (MISCELLANEOUS) ×2 IMPLANT
CLIP APPLIE 9.375 MED OPEN (MISCELLANEOUS) ×1 IMPLANT
CLOTH BEACON ORANGE TIMEOUT ST (SAFETY) ×2 IMPLANT
COVER MAYO STAND STRL (DRAPES) ×2 IMPLANT
COVER PROBE W GEL 5X96 (DRAPES) ×2 IMPLANT
COVER TABLE BACK 60X90 (DRAPES) ×2 IMPLANT
DECANTER SPIKE VIAL GLASS SM (MISCELLANEOUS) ×2 IMPLANT
DERMABOND ADVANCED (GAUZE/BANDAGES/DRESSINGS) ×1
DERMABOND ADVANCED .7 DNX12 (GAUZE/BANDAGES/DRESSINGS) ×1 IMPLANT
DEVICE DUBIN W/COMP PLATE 8390 (MISCELLANEOUS) ×2 IMPLANT
DRAIN CHANNEL 19F RND (DRAIN) IMPLANT
DRAPE LAPAROSCOPIC ABDOMINAL (DRAPES) ×2 IMPLANT
DRAPE U-SHAPE 76X120 STRL (DRAPES) IMPLANT
DRSG TEGADERM 4X4.75 (GAUZE/BANDAGES/DRESSINGS) ×4 IMPLANT
ELECT COATED BLADE 2.86 ST (ELECTRODE) ×2 IMPLANT
ELECT REM PT RETURN 9FT ADLT (ELECTROSURGICAL) ×2
ELECTRODE REM PT RTRN 9FT ADLT (ELECTROSURGICAL) ×1 IMPLANT
EVACUATOR SILICONE 100CC (DRAIN) IMPLANT
GAUZE SPONGE 4X4 12PLY STRL LF (GAUZE/BANDAGES/DRESSINGS) ×2 IMPLANT
GLOVE BIO SURGEON STRL SZ7 (GLOVE) ×4 IMPLANT
GLOVE BIOGEL PI IND STRL 7.5 (GLOVE) ×1 IMPLANT
GLOVE BIOGEL PI INDICATOR 7.5 (GLOVE) ×1
GLOVE ECLIPSE 6.5 STRL STRAW (GLOVE) ×2 IMPLANT
GOWN PREVENTION PLUS XLARGE (GOWN DISPOSABLE) ×4 IMPLANT
KIT MARKER MARGIN INK (KITS) ×2 IMPLANT
NDL SAFETY ECLIPSE 18X1.5 (NEEDLE) IMPLANT
NEEDLE HYPO 18GX1.5 SHARP (NEEDLE)
NEEDLE HYPO 25X1 1.5 SAFETY (NEEDLE) ×2 IMPLANT
NS IRRIG 1000ML POUR BTL (IV SOLUTION) ×2 IMPLANT
PACK BASIN DAY SURGERY FS (CUSTOM PROCEDURE TRAY) ×2 IMPLANT
PENCIL BUTTON HOLSTER BLD 10FT (ELECTRODE) ×2 IMPLANT
PIN SAFETY STERILE (MISCELLANEOUS) IMPLANT
SLEEVE SCD COMPRESS KNEE MED (MISCELLANEOUS) ×2 IMPLANT
SPONGE LAP 18X18 X RAY DECT (DISPOSABLE) IMPLANT
SPONGE LAP 4X18 X RAY DECT (DISPOSABLE) ×2 IMPLANT
STAPLER VISISTAT 35W (STAPLE) ×2 IMPLANT
STOCKINETTE IMPERVIOUS LG (DRAPES) IMPLANT
STRIP CLOSURE SKIN 1/2X4 (GAUZE/BANDAGES/DRESSINGS) ×2 IMPLANT
SUT MNCRL AB 4-0 PS2 18 (SUTURE) ×2 IMPLANT
SUT MON AB 5-0 PS2 18 (SUTURE) IMPLANT
SUT SILK 2 0 SH (SUTURE) IMPLANT
SUT VIC AB 2-0 SH 27 (SUTURE) ×2
SUT VIC AB 2-0 SH 27XBRD (SUTURE) ×2 IMPLANT
SUT VIC AB 3-0 SH 27 (SUTURE) ×1
SUT VIC AB 3-0 SH 27X BRD (SUTURE) ×1 IMPLANT
SUT VIC AB 5-0 PS2 18 (SUTURE) IMPLANT
SUT VICRYL AB 3 0 TIES (SUTURE) IMPLANT
SYR CONTROL 10ML LL (SYRINGE) ×2 IMPLANT
TOWEL OR 17X24 6PK STRL BLUE (TOWEL DISPOSABLE) ×2 IMPLANT
TOWEL OR NON WOVEN STRL DISP B (DISPOSABLE) ×2 IMPLANT
TUBE CONNECTING 20X1/4 (TUBING) ×2 IMPLANT
WATER STERILE IRR 1000ML POUR (IV SOLUTION) IMPLANT
YANKAUER SUCT BULB TIP NO VENT (SUCTIONS) ×2 IMPLANT
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
## 2012-01-04 NOTE — Op Note (Addendum)
Preoperative diagnosis: Clinical stage II right breast cancer Postoperative diagnosis: Same as above Procedure: #1 right breast wire-guided lumpectomy #2 right axillary sentinel lymph node biopsy Surgeon: Dr. Harden Mo Anesthesia: Gen. With LMA Specimens: Right breast tissue marked with paint, right axillary sentinel node with counts of 2754 Complications: None Estimated blood loss: Minimal Drains: None Sponge and needle count correct x2 at end of operation Disposition to recovery in stable condition  Indications: This is 71 year old female had a screening mammogram with a finding of a right breast mass. This underwent biopsy showing a hormone receptor-positive right breast cancer. She was seen in normal tie disciplinary clinic and was discussed all of her options. We decided to attempt breast conservation therapy with sentinel lymph node biopsy.  Procedure: After informed consent was obtained the patient first had a wire placed lesion. I had these mammograms in the operating room. She was administered technetium in a standard periareolar fashion. She was then taken to the operating room. She was administered 400 mg of IV ciprofloxacin due to her allergies. Sequential compression devices were placed. She is then placed under general anesthesia with an LMA. Her right breast and axilla were then prepped and draped in the standard sterile surgical fashion.  Careful attention was made towards her right arm and shoulder. Surgical timout was then performed.  I made an elliptical incision I then excised the paddle of skin overlying the tumor. I used cautery to excise the wire and all the surrounding tissue all the way down to the pectoralis muscle including the pectoralis fascia. This was hemostatic upon completion. I then used paint to mark this. A mammogram was taken in the operating room and showed the clip in the mass and the wire all be present. The clip was directly in the center the specimen.  I then placed 2 clips on the muscle. I placed one clip in each position around the cavity. I closed this with 2-0 Vicryl, 3-0 Vicryl, and 4 Monocryl. Radiology confirmed removal of the appropriate area. Eventually I also placed Dermabond and Steri-Strips over this incision.  I then made a 2 cm incision below her axillary hairline a right axilla. I went through the axillary fashion with cautery. I then identified one sentinel lymph node with counts as listed above. The background radioactivity was less than 20. There were no palpable nodes. This node was passed off the table as axillary sentinel note. I then obtained hemostasis and closed the axillary fascia with 2-0 Vicryl. The skin was closed with 3-0 Vicryl, 4-0 Monocryl, and Steri-Strips. A dressing was placed over this. A breast binder was placed. She was extubated and transferred to recovery stable.

## 2012-01-04 NOTE — Interval H&P Note (Signed)
History and Physical Interval Note:  01/04/2012 10:59 AM  Alexandria [name]  has presented today for surgery, with the diagnosis of right breast cancer  The various methods of treatment have been discussed with the patient and family. After consideration of risks, benefits and other options for treatment, the patient has consented to  Procedure(s) (LRB) with comments: BREAST LUMPECTOMY WITH NEEDLE LOCALIZATION AND AXILLARY SENTINEL LYMPH NODE BX (Right) - right breast wire guided lumpectomy, right axillary sentinel node biopsy as a surgical intervention .  The patient's history has been reviewed, patient examined, no change in status, stable for surgery.  I have reviewed the patient's chart and labs.  Questions were answered to the patient's satisfaction.     Alexandria [name]

## 2012-01-04 NOTE — Anesthesia Preprocedure Evaluation (Signed)
Anesthesia Evaluation  Patient identified by MRN, date of birth, ID band Patient awake    Reviewed: Allergy & Precautions, H&P , NPO status , Patient's Chart, lab work & pertinent test results  Airway Mallampati: II TM Distance: >3 FB Neck ROM: Full    Dental No notable dental hx. (+) Teeth Intact and Dental Advisory Given   Pulmonary asthma , COPD breath sounds clear to auscultation  Pulmonary exam normal       Cardiovascular hypertension, On Medications Rhythm:Regular Rate:Normal     Neuro/Psych PSYCHIATRIC DISORDERS negative neurological ROS     GI/Hepatic Neg liver ROS, GERD-  Medicated,  Endo/Other  negative endocrine ROS  Renal/GU negative Renal ROS  negative genitourinary   Musculoskeletal   Abdominal   Peds  Hematology negative hematology ROS (+)   Anesthesia Other Findings   Reproductive/Obstetrics negative OB ROS                           Anesthesia Physical Anesthesia Plan  ASA: III  Anesthesia Plan: General   Post-op Pain Management:    Induction: Intravenous  Airway Management Planned: LMA  Additional Equipment:   Intra-op Plan:   Post-operative Plan: Extubation in OR  Informed Consent: I have reviewed the patients History and Physical, chart, labs and discussed the procedure including the risks, benefits and alternatives for the proposed anesthesia with the patient or authorized representative who has indicated his/her understanding and acceptance.   Dental advisory given  Plan Discussed with: CRNA  Anesthesia Plan Comments:         Anesthesia Quick Evaluation

## 2012-01-04 NOTE — Anesthesia Postprocedure Evaluation (Signed)
Anesthesia Post Note  Patient: Alexandria [name]  Procedure(s) Performed: Procedure(s) (LRB): BREAST LUMPECTOMY WITH NEEDLE LOCALIZATION AND AXILLARY SENTINEL LYMPH NODE BX (Right)  Anesthesia type: General  Patient location: PACU  Post pain: Pain level controlled and Adequate analgesia  Post assessment: Post-op Vital signs reviewed, Patient's Cardiovascular Status Stable, Respiratory Function Stable, Patent Airway and Pain level controlled  Last Vitals:  Filed Vitals:   01/04/12 1358  BP: 141/63  Pulse: 63  Temp: 36.8 C  Resp: 16    Post vital signs: Reviewed and stable  Level of consciousness: awake, alert  and oriented  Complications: No apparent anesthesia complications

## 2012-01-04 NOTE — Transfer of Care (Signed)
Immediate Anesthesia Transfer of Care Note  Patient: Alexandria [name]  Procedure(s) Performed: Procedure(s) (LRB) with comments: BREAST LUMPECTOMY WITH NEEDLE LOCALIZATION AND AXILLARY SENTINEL LYMPH NODE BX (Right) - right breast wire guided lumpectomy, right axillary sentinel node biopsy  Patient Location: PACU  Anesthesia Type: General  Level of Consciousness: sedated  Airway & Oxygen Therapy: Patient Spontanous Breathing and Patient connected to face mask oxygen  Post-op Assessment: Report given to PACU RN and Post -op Vital signs reviewed and stable  Post vital signs: Reviewed and stable  Complications: No apparent anesthesia complications

## 2012-01-09 ENCOUNTER — Telehealth: Payer: Self-pay | Admitting: *Deleted

## 2012-01-09 NOTE — Progress Notes (Signed)
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 participate in the care of Alexandria [name]. I will continue to follow up the patient with you and assist in her care.  All questions were answered. The  patient knows to call the clinic with any problems, questions or concerns. We can certainly see the patient much sooner if necessary.  I spent 60 minutes counseling the patient face to face. The total time spent in the appointment was 60 minutes.   Drue Second, MD Medical/Oncology [facility] [phone] (beeper) [phone] (Office)

## 2012-01-09 NOTE — Telephone Encounter (Signed)
Left vm for pt to return call regarding [facility] from 12/28/11. 

## 2012-01-10 ENCOUNTER — Telehealth: Payer: Self-pay | Admitting: *Deleted

## 2012-01-10 NOTE — Telephone Encounter (Signed)
Spoke to pt concerning [facility] from 12/28/11.  Pt denies questions or concerns regarding dx or treatment care plan.  Discussed Oncotype Dx and when we will be sending the test out d/t medicare requirements.  Gave pt f/u appt date and time with Dr. Welton Flakes for 11/22 at 1:00.  Pt denies further needs.  Encourage pt to call.  Received verbal understanding.  Contact information given.

## 2012-01-12 ENCOUNTER — Ambulatory Visit (INDEPENDENT_AMBULATORY_CARE_PROVIDER_SITE_OTHER): Payer: Medicare Other | Admitting: Licensed Clinical Social Worker

## 2012-01-12 DIAGNOSIS — F4323 Adjustment disorder with mixed anxiety and depressed mood: Secondary | ICD-10-CM

## 2012-01-16 ENCOUNTER — Encounter: Payer: Self-pay | Admitting: *Deleted

## 2012-01-16 NOTE — Progress Notes (Signed)
Mailed after appt letter to pt. 

## 2012-01-17 ENCOUNTER — Encounter (INDEPENDENT_AMBULATORY_CARE_PROVIDER_SITE_OTHER): Payer: Medicare Other | Admitting: General Surgery

## 2012-01-20 ENCOUNTER — Ambulatory Visit (INDEPENDENT_AMBULATORY_CARE_PROVIDER_SITE_OTHER): Payer: Medicare Other | Admitting: General Surgery

## 2012-01-20 ENCOUNTER — Encounter: Payer: Self-pay | Admitting: *Deleted

## 2012-01-20 ENCOUNTER — Encounter (INDEPENDENT_AMBULATORY_CARE_PROVIDER_SITE_OTHER): Payer: Medicare Other | Admitting: General Surgery

## 2012-01-20 ENCOUNTER — Encounter (INDEPENDENT_AMBULATORY_CARE_PROVIDER_SITE_OTHER): Payer: Self-pay | Admitting: General Surgery

## 2012-01-20 VITALS — BP 132/80 | HR 53 | Temp 98.0°F | Resp 18 | Ht 64.0 in | Wt 141.6 lb

## 2012-01-20 DIAGNOSIS — Z09 Encounter for follow-up examination after completed treatment for conditions other than malignant neoplasm: Secondary | ICD-10-CM

## 2012-01-20 NOTE — Progress Notes (Signed)
Subjective:     Patient ID: Alexandria [name], female   DOB: 1940/08/04, 71 y.o.   MRN: [id number]  HPI This is a 71 year old female who was taken to the operating room and I performed a lumpectomy as well as a sentinel lymph node biopsy. She's doing well postoperatively and has no real complaints today. 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 This is ER-positive, PR negative, HER-2/neu is not amplified. She is due to be seen at the breast cancer center next week. She has an oncotype pending.  Review of Systems     Objective:   Physical Exam  Pulmonary/Chest:         Assessment:     Stage I right breast cancer status post lumpectomy and sentinel node biopsy    Plan:     I released her to full activity. I will plan on seeing her in 6 months unless she needs something sooner.

## 2012-01-20 NOTE — Progress Notes (Signed)
Ordered Oncotype Dx test w/ [facility].  Faxed request to Path. 

## 2012-02-02 ENCOUNTER — Encounter: Payer: Self-pay | Admitting: *Deleted

## 2012-02-02 NOTE — Progress Notes (Signed)
Received Oncotype Dx results of 9.  Gave copy to MD.  Took copy to Med Rec to scan.  

## 2012-02-03 ENCOUNTER — Telehealth: Payer: Self-pay | Admitting: *Deleted

## 2012-02-03 ENCOUNTER — Ambulatory Visit (HOSPITAL_BASED_OUTPATIENT_CLINIC_OR_DEPARTMENT_OTHER): Payer: Medicare Other | Admitting: Oncology

## 2012-02-03 ENCOUNTER — Encounter: Payer: Self-pay | Admitting: Oncology

## 2012-02-03 VITALS — BP 118/76 | HR 59 | Temp 98.2°F | Resp 20 | Ht 64.0 in | Wt 144.7 lb

## 2012-02-03 DIAGNOSIS — C50219 Malignant neoplasm of upper-inner quadrant of unspecified female breast: Secondary | ICD-10-CM

## 2012-02-03 DIAGNOSIS — Z170 Estrogen receptor positive status [ER+]: Secondary | ICD-10-CM

## 2012-02-03 NOTE — Progress Notes (Signed)
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  PRIOR THERAPY:  #1 patient is status post lumpectomy with sentinel lymph node biopsy. 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  #2 patient's tumor was sent to Oncotype testing with a recurrence score of 9 putting her at risk for distant recurrence of only 7%. This is very acceptable  #3 patient will first proceed with radiation therapy this will then be followed by antiestrogen therapy consisting of an aromatase inhibitor.  CURRENT THERAPY: Proceed with radiation therapy first  INTERVAL HISTORY: Alexandria [name] 71 y.o. female returns for followup visit post lumpectomy overall she's doing well without any significant complaints. She has healed well. She is requesting to be seen by physical therapy and I have referred her to them. She otherwise denies any nausea vomiting fevers chills night sweats headaches no chest pains no shortness of breath no palpitations no bleeding problems. Surgical site is healed well. Remainder of the 10 point review of systems is unremarkable.  MEDICAL HISTORY: Past Medical History  Diagnosis Date  . Allergy   . Hearing loss     bilateral hearing aids  . Hyperlipidemia   . GERD (gastroesophageal reflux disease)   . COPD (chronic obstructive pulmonary disease)   . 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 problems with balance  . IBS (irritable bowel syndrome)   . History of kidney stones   . 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 Traumatic hemopneumothorax 1982    left  . PVD (peripheral vascular disease)     varicose veins - left worse than right  . Anxiety   . Depression   .  Hypertension     under control, has been on med. since age 54  . Spinal stenosis   . Arthritis     left knee, hands, back  . Palpitations   . Abrasion of finger of left hand 12/29/2011    left middle   . Dental crowns present   . Breast cancer 12/2011    right  . Asthma     daily and prn inhalers    ALLERGIES:  is allergic to aspirin; chocolate; coffee bean extract; ibuprofen; oxycodone hcl; penicillins; shrimp; sulfonamide derivatives; augmentin; and eggs or egg-derived products.  MEDICATIONS:  Current Outpatient Prescriptions  Medication Sig Dispense Refill  . albuterol (PROVENTIL HFA;VENTOLIN HFA) 108 (90 BASE) MCG/ACT inhaler Inhale 2 puffs into the lungs every 6 (six) hours as needed. For shortness of breath and wheezing  18 g  3  . atenolol-chlorthalidone (TENORETIC 50) 50-25 MG per tablet Take 1 tablet by mouth daily.  100 tablet  3  . atorvastatin (LIPITOR) 20 MG tablet Take 1 tablet (20 mg total) by mouth daily. Twice weekly  100 tablet  3  . b complex vitamins tablet Take 1 tablet by mouth daily. PLUS FOLIC ACID PLUS VIT. C      . Cholecalciferol (VITAMIN D-3) 5000 UNITS TABS Take 1,000 Units by mouth daily.       . clopidogrel (PLAVIX) 75 MG tablet Take 1 tablet (75 mg total) by mouth daily.  30 tablet  6  . co-enzyme Q-10 50 MG capsule Take 200 mg by mouth daily.      Marland Kitchen  cyclobenzaprine (FLEXERIL) 5 MG tablet Take 5 mg by mouth 3 (three) times daily as needed. For muscle spasms      . escitalopram (LEXAPRO) 10 MG tablet Take 10 mg by mouth. 1/2 - 1 at bedtime      . fluticasone (FLONASE) 50 MCG/ACT nasal spray Place 2 sprays into the nose daily.  16 g  11  . Fluticasone-Salmeterol (ADVAIR DISKUS) 100-50 MCG/DOSE AEPB Inhale 1 puff into the lungs 2 (two) times daily at 10 AM and 5 PM.  60 each  11  . glucosamine-chondroitin 500-400 MG tablet Take 1 tablet by mouth 2 (two) times daily. 1500/1200 MG      . HYDROcodone-acetaminophen (NORCO) 10-325 MG per tablet Take 1 tablet by  mouth every 6 (six) hours as needed for pain.  20 tablet  0  . K-DUR 20 MEQ tablet TAKE 3 TABLETS DAILY.  102 each  1  . loratadine (CLARITIN) 10 MG tablet Take 10 mg by mouth daily.        . meloxicam (MOBIC) 15 MG tablet Take 2 tablets (30 mg total) by mouth daily.  100 tablet  3  . pantoprazole (PROTONIX) 40 MG tablet Take 1 tablet (40 mg total) by mouth daily.  30 tablet  3  . Probiotic Product (ALIGN PO) Take 1 tablet by mouth daily.       Marland Kitchen selenium 50 MCG TABS Take by mouth daily. 200mg  qd        SURGICAL HISTORY:  Past Surgical History  Procedure Date  . Vein surgery     vein ablation left leg  . Knee surgery 1980s    left  . Orif ankle fracture 1980s    right   . Ankle hardware removal     right  . Tubal ligation 1976  . Cholecystectomy 1990s  . Transobturator sling 07/05/2005  . Cystoscopy 07/05/2005  . Breast biopsy 1976    right  . Breast lumpectomy 2013    right with snbx    REVIEW OF SYSTEMS:  Pertinent items are noted in HPI.   HEALTH MAINTENANCE:   PHYSICAL EXAMINATION: Blood pressure 118/76, pulse 59, temperature 98.2 F (36.8 C), resp. rate 20, height 5\' 4"  (1.626 m), weight 144 lb 11.2 oz (65.635 kg). Body mass index is 24.84 kg/(m^2). ECOG PERFORMANCE STATUS: 0 - Asymptomatic   Well-developed nourished female in no acute distress HEENT exam EOMI PERRLA sclerae anicteric no conjunctival pallor oral mucosa is moist neck is supple lungs are clear bilaterally cardiovascular is regular rate rhythm abdomen is soft nontender nondistended bowel sounds are present no HSM extremities no edema neuro patient's alert oriented otherwise nonfocal. Breast exam right breast reveals well-healed incisional scar no masses no nipple discharge and retraction or inversion. Left breast no masses or nipple discharge   LABORATORY DATA: Lab Results  Component Value Date   WBC 5.5 12/28/2011   HGB 13.6 01/04/2012   HCT 36.2 12/28/2011   MCV 90.7 12/28/2011   PLT 177 12/28/2011       Chemistry      Component Value Date/Time   NA 141 12/30/2011 1208   NA 142 12/28/2011 0836   K 3.8 12/30/2011 1208   K 3.6 12/28/2011 0836   CL 102 12/30/2011 1208   CL 106 12/28/2011 0836   CO2 30 12/30/2011 1208   CO2 28 12/28/2011 0836   BUN 20 12/30/2011 1208   BUN 20.0 12/28/2011 0836   CREATININE 0.86 12/30/2011 1208   CREATININE 0.9 12/28/2011 0836  Component Value Date/Time   CALCIUM 9.2 12/30/2011 1208   CALCIUM 9.3 12/28/2011 0836   ALKPHOS 56 12/28/2011 0836   ALKPHOS 43 06/23/2011 1522   AST 21 12/28/2011 0836   AST 21 06/23/2011 1522   ALT 17 12/28/2011 0836   ALT 16 06/23/2011 1522   BILITOT 0.80 12/28/2011 0836   BILITOT 1.2 06/23/2011 1522     1. A sample was sent to [facility] for oncotype testing. The patient's recurrence score is 9. Those patients who had a recurrence score of 9 had an average rate of distant recurrence of 7%. (JBK:gt, 02/02/12) Pecola Leisure MD Pathologist, Electronic Signature ( Signed 02/02/2012) 1. CHROMOGENIC IN-SITU HYBRIDIZATION Interpretation HER-2/NEU BY [facility] - NO AMPLIFICATION OF HER-2 DETECTED. THE RATIO OF HER-2: CEP 17 SIGNALS WAS 1.19. Reference range: Ratio: HER2:CEP17 < 1.8 - gene amplification not observed Ratio: HER2:CEP 17 1.8-2.2 - equivocal result Ratio: HER2:CEP17 > 2.2 - gene amplification observed Pecola Leisure MD Pathologist, Electronic Signature ( Signed 01/10/2012) FINAL DIAGNOSIS Diagnosis 1. Breast, lumpectomy, Right - INVASIVE DUCTAL CARCINOMA, GRADE I / III SPANNING 1.6 CM. - DUCTAL CARCINOMA IN SITU, LOW GRADE. 1 of 3 FINAL for Dust, Bisma L ([id number]) Diagnosis(continued) - THE SURGICAL RESECTION MARGINS ARE NEGATIVE FOR CARCINOMA. - SEE ONCOLOGY TABLE BELOW. 2. Lymph node, sentinel, biopsy, Right - THERE IS NO EVIDENCE OF CARCINOMA IN 1 OF 1 LYMPH NODE (0/1). Microscopic Comment 1. BREAST, INVASIVE TUMOR, WITH LYMPH NODE SAMPLING Specimen, including laterality: Right  breast Procedure: Needle localized lumpectomy Grade: I Tubule formation: II Nuclear pleomorphism: II Mitotic: I Tumor size (glass slide measurement): 1.6 cm Margins: Greater than 0.3 cm to all margins (gross measurements) Lymphovascular invasion: Not identified Ductal carcinoma in situ: Present, low grade Extensive intraductal carcinoma: Not identified. Lobular neoplasia: Not identified Tumor focality: Unifocal Treatment effect: N/A Extent of tumor: Confined to breast parenchyma Lymph nodes: # examined: 1 Lymph nodes with metastasis: 0 Breast prognostic profile: Case [id number] Estrogen receptor: 100%, strong staining intensity Progesterone receptor: 0% Her 2 neu: No amplification was detected, the ratio was 1.14, Her 2 neu by [facility] will be repeated on the current case and the results reported separately. Ki-67: 27% Non-neoplastic breast: Healing biopsy site TNM: pT1c, pN0 Comments: The malignant cells are positive for e-cadherin. (JBK:caf 01/05/12) Pecola Leisure MD Pathologist, Electronic Signature (Case signed 01/06/2012) Specimen Gross and Clinical Information Specimen(s) Obtained: 1. Breast, lumpectomy, Right 2. Lymph node, sentinel, biopsy, Right Specimen Clinical Information 1. Right breast cancer  RADIOGRAPHIC STUDIES:  No results found.  ASSESSMENT: 71 year old female with  #1 stage I invasive ductal carcinoma measuring 1.6 cm ER positive PR negative HER-2/neu negative. She is status post lumpectomy with sentinel lymph node biopsy. All sentinel nodes were negative for metastatic disease. Patient had Oncotype testing with a recurrence score of only 9 giving her a 7% distance recurrence with antiestrogen therapy only. Patient would not benefit from chemotherapy.  #2 recommendation is for patient to proceed with radiation therapy first then followed by antiestrogen therapy consisting of an aromatase inhibitor for a total of 5 years. We discussed risks and benefits of  of antiestrogen therapy as well as Oncotype testing.   PLAN:   #1 proceed with radiation.  #2 referred to physical therapy.  #3 patient will be seen back after she completes her radiation.   All questions were answered. The patient knows to call the clinic with any problems, questions or concerns. We can certainly see the patient much sooner if necessary.  I spent  25 minutes counseling the patient face to face. The total time spent in the appointment was 30 minutes.    Drue Second, MD Medical/Oncology [facility] [phone] (beeper) [phone] (Office)  02/03/2012, 1:39 PM

## 2012-02-03 NOTE — Telephone Encounter (Signed)
Faxed over referral [phone] left voice message to inform them to call the patient gave patient. Made patient appointment for 02-22-2012 with dr.wentworth

## 2012-02-03 NOTE — Patient Instructions (Addendum)
Proceed with radiation therapy first  Referral to physical therapy  I will see you back at the end of January 2014

## 2012-02-04 ENCOUNTER — Other Ambulatory Visit: Payer: Self-pay | Admitting: Family Medicine

## 2012-02-06 ENCOUNTER — Ambulatory Visit (INDEPENDENT_AMBULATORY_CARE_PROVIDER_SITE_OTHER): Payer: Medicare Other | Admitting: Licensed Clinical Social Worker

## 2012-02-06 DIAGNOSIS — F4323 Adjustment disorder with mixed anxiety and depressed mood: Secondary | ICD-10-CM

## 2012-02-13 ENCOUNTER — Ambulatory Visit: Payer: Medicare Other | Attending: Oncology | Admitting: Physical Therapy

## 2012-02-13 DIAGNOSIS — R293 Abnormal posture: Secondary | ICD-10-CM | POA: Insufficient documentation

## 2012-02-13 DIAGNOSIS — C50919 Malignant neoplasm of unspecified site of unspecified female breast: Secondary | ICD-10-CM | POA: Insufficient documentation

## 2012-02-13 DIAGNOSIS — IMO0001 Reserved for inherently not codable concepts without codable children: Secondary | ICD-10-CM | POA: Insufficient documentation

## 2012-02-13 DIAGNOSIS — M25519 Pain in unspecified shoulder: Secondary | ICD-10-CM | POA: Insufficient documentation

## 2012-02-13 DIAGNOSIS — M25619 Stiffness of unspecified shoulder, not elsewhere classified: Secondary | ICD-10-CM | POA: Insufficient documentation

## 2012-02-14 ENCOUNTER — Ambulatory Visit: Payer: Medicare Other | Admitting: Physical Therapy

## 2012-02-14 ENCOUNTER — Telehealth: Payer: Self-pay | Admitting: Genetic Counselor

## 2012-02-14 NOTE — Telephone Encounter (Signed)
Revealed negative Breast/ovarian cancer panel testing.  Discussed that other family members may want to consider testing to ensure that they do not have a genetic mutation.

## 2012-02-15 ENCOUNTER — Telehealth: Payer: Self-pay | Admitting: Oncology

## 2012-02-15 ENCOUNTER — Encounter: Payer: Self-pay | Admitting: Genetic Counselor

## 2012-02-15 NOTE — Telephone Encounter (Signed)
x

## 2012-02-15 NOTE — Telephone Encounter (Signed)
lmonvm adviisng the pt of her cancelled 04/13/2011 appts that hve been r/s to 03/15/2012 due to a change in the md's schedule.

## 2012-02-21 ENCOUNTER — Ambulatory Visit: Payer: Medicare Other | Admitting: Physical Therapy

## 2012-02-22 ENCOUNTER — Encounter: Payer: Self-pay | Admitting: Radiation Oncology

## 2012-02-22 ENCOUNTER — Ambulatory Visit
Admission: RE | Admit: 2012-02-22 | Discharge: 2012-02-22 | Disposition: A | Discharge status: Home / Self Care | Payer: Medicare Other | Source: Ambulatory Visit | Attending: Radiation Oncology | Admitting: Radiation Oncology

## 2012-02-22 ENCOUNTER — Other Ambulatory Visit: Payer: Self-pay | Admitting: Family Medicine

## 2012-02-22 VITALS — BP 147/65 | HR 52 | Temp 98.4°F | Resp 18 | Wt 148.6 lb

## 2012-02-22 DIAGNOSIS — Z91012 Allergy to eggs: Secondary | ICD-10-CM | POA: Insufficient documentation

## 2012-02-22 DIAGNOSIS — Z880 Allergy status to penicillin: Secondary | ICD-10-CM | POA: Insufficient documentation

## 2012-02-22 DIAGNOSIS — C50219 Malignant neoplasm of upper-inner quadrant of unspecified female breast: Secondary | ICD-10-CM

## 2012-02-22 DIAGNOSIS — Z9109 Other allergy status, other than to drugs and biological substances: Secondary | ICD-10-CM | POA: Insufficient documentation

## 2012-02-22 DIAGNOSIS — Z882 Allergy status to sulfonamides status: Secondary | ICD-10-CM | POA: Insufficient documentation

## 2012-02-22 DIAGNOSIS — Z91013 Allergy to seafood: Secondary | ICD-10-CM | POA: Insufficient documentation

## 2012-02-22 DIAGNOSIS — Z09 Encounter for follow-up examination after completed treatment for conditions other than malignant neoplasm: Secondary | ICD-10-CM | POA: Insufficient documentation

## 2012-02-22 DIAGNOSIS — Z888 Allergy status to other drugs, medicaments and biological substances status: Secondary | ICD-10-CM | POA: Insufficient documentation

## 2012-02-22 DIAGNOSIS — Z881 Allergy status to other antibiotic agents status: Secondary | ICD-10-CM | POA: Insufficient documentation

## 2012-02-22 NOTE — Addendum Note (Signed)
Encounter addended by: Dayra Rapley Mintz Khamya Topp, RN on: 02/22/2012  7:50 PM<BR>     Documentation filed: Charges VN

## 2012-02-22 NOTE — Progress Notes (Signed)
Patient presented to the clinic today unaccompanied for consultation with Dr. Michell Heinrich to discuss the role of radiation therapy in the treatment of right breast ca. Patient alert and oriented to person, place, and time. No distress noted. Steady gait noted. Pleasant affect noted. Patient denies pain at this time. However, patient reports that her right axilla remain sore from surgery. Two right breast surgical incisions are healed without redness, drainage or edema. Patient reports applying vitamin e daily to incision sites. Patient concerned about thickening in left breast. Patient reports a hx of estrogen therapy. Patient denies nausea, vomiting, headache, or dizziness. Patient reports that she has gained 10 pounds since diagnoses due to emotional eating. Patient has a hx of asthma and allergies which have been worse lately. Patient reports that ROM of right arm have greatly improved since ABC and PT twice per week. Patient able to left her arm above her head without intense pain. Patient reports that her last PT is scheduled for 03/08/2012. No edema of right arm noted. Reported all findings to Dr. Michell Heinrich.

## 2012-02-22 NOTE — Progress Notes (Signed)
Complete PATIENT MEASURE OF DISTRESS worksheet with a score of 1 submitted to social work. 

## 2012-02-22 NOTE — Progress Notes (Signed)
71 year old female.  1.6 cm invasive ductal carcinoma ER + PR - HER 2 -. S/P lumpectomy with sentinel lymph node biopsy with negative margins and nodes. Oncotype score 9 with 7% chance of reoccurence. Following completion of radiation therapy will begin antiestrogen therapy under the care of Dr. Welton Flakes.   Multiple allergies No indication of a pacemaker No hx of radiation therapy

## 2012-02-22 NOTE — Progress Notes (Signed)
See progress note under physician encounter. 

## 2012-02-22 NOTE — Progress Notes (Signed)
Department of Radiation Oncology  Phone:  [phone] Fax:        [phone]   Name: Alexandria [name]   DOB: Jan 10, 1941 MRN: [id number]  Date: 02/22/2012  Follow Up Visit Note  Diagnosis: T1 C. N0 grade 1 invasive ductal carcinoma, negative margins  Interval History: Alexandria [name] presents today for routine followup.  He tolerated her surgery well. It she is recovered well. She had an Oncotype performed which showed a lower current score and chemotherapy was not recommended. She was therefore returned to me for consideration of adjuvant radiation. Her only complaint today is some palpable abnormalities in her left breast. She said she is not sure if this were present before her or if it's just because she's been more vigilant regarding her breast tissue. It is not painful. She can only feel it when she is lying down.  Allergies:  Allergies  Allergen Reactions  . Aspirin Hives  . Chocolate Hives and Other (See Comments)    RUNNY NOSE   . Coffee Bean Extract Hives and Other (See Comments)    RUNNY NOSE   . Ibuprofen Hives and Other (See Comments)    RUNNY NOSE  . Oxycodone Hcl Hives and Nausea And Vomiting  . Penicillins Hives  . Shrimp (Shellfish Allergy) Hives and Other (See Comments)    RUNNY NOSE   . Sulfonamide Derivatives Hives  . Augmentin (Amoxicillin-Pot Clavulanate) Itching and Rash  . Eggs Or Egg-Derived Products Nausea Only    Medications:  Current Outpatient Prescriptions  Medication Sig Dispense Refill  . albuterol (PROVENTIL HFA;VENTOLIN HFA) 108 (90 BASE) MCG/ACT inhaler Inhale 2 puffs into the lungs every 6 (six) hours as needed. For shortness of breath and wheezing  18 g  3  . atenolol-chlorthalidone (TENORETIC 50) 50-25 MG per tablet Take 1 tablet by mouth daily.  100 tablet  3  . atorvastatin (LIPITOR) 20 MG tablet Take 1 tablet (20 mg total) by mouth daily. Twice weekly  100 tablet  3  . b complex vitamins tablet Take 1 tablet by mouth daily. PLUS FOLIC  ACID PLUS VIT. C      . Cholecalciferol (VITAMIN D-3) 5000 UNITS TABS Take 1,000 Units by mouth daily.       . clopidogrel (PLAVIX) 75 MG tablet Take 1 tablet (75 mg total) by mouth daily.  30 tablet  6  . co-enzyme Q-10 50 MG capsule Take 200 mg by mouth daily.      . cyclobenzaprine (FLEXERIL) 5 MG tablet Take 5 mg by mouth 3 (three) times daily as needed. For muscle spasms      . escitalopram (LEXAPRO) 10 MG tablet Take 10 mg by mouth. 1/2 - 1 at bedtime      . fluticasone (FLONASE) 50 MCG/ACT nasal spray Place 2 sprays into the nose daily.  16 g  11  . Fluticasone-Salmeterol (ADVAIR DISKUS) 100-50 MCG/DOSE AEPB Inhale 1 puff into the lungs 2 (two) times daily at 10 AM and 5 PM.  60 each  11  . glucosamine-chondroitin 500-400 MG tablet Take 1 tablet by mouth 2 (two) times daily. 1500/1200 MG      . HYDROcodone-acetaminophen (NORCO) 10-325 MG per tablet Take 1 tablet by mouth every 6 (six) hours as needed for pain.  20 tablet  0  . loratadine (CLARITIN) 10 MG tablet Take 10 mg by mouth daily.        . meloxicam (MOBIC) 15 MG tablet Take 2 tablets (30 mg total) by mouth daily.  100 tablet  3  . pantoprazole (PROTONIX) 40 MG tablet Take 1 tablet (40 mg total) by mouth daily.  30 tablet  3  . potassium chloride SA (K-DUR,KLOR-CON) 20 MEQ tablet TAKE 3 TABLETS DAILY.  180 tablet  3  . Probiotic Product (ALIGN PO) Take 1 tablet by mouth daily.       Marland Kitchen selenium 50 MCG TABS Take by mouth daily. 200mg  qd        Physical Exam:   weight is 148 lb 9.6 oz (67.405 kg). Her oral temperature is 98.4 F (36.9 C). Her blood pressure is 147/65 and her pulse is 52. Her respiration is 18.  Well-healed incision over the right breast. Sentinel lymph node incision is well-healed as well. In the area of concern at approximately the 1 to 2:00 position of the left breast there is some platelike breast tissue. I couldn't feel this in the right breast in approximately the femoral or location as well. I reviewed her MRI  scan. She did have bilateral contrast enhancement. There was no abnormality in this area on the left.  IMPRESSION: Alexandria [name] is a 71 y.o. female status post right lumpectomy and sentinel lymph node biopsy for a node negative invasive ductal carcinoma with negative margins  PLAN:  I spoke with the patient today regarding her diagnosis and options for treatment. We again went over the role of radiation and decreasing local failure in patients who undergo breast conservation. We discussed the process of simulation the placement tattoos. We discussed 4 weeks of treatment as an outpatient. I believe she does qualify for hypofractionation given the small size of her breasts. We discussed skin redness as a possible side effect. We discussed the possibility of lung and rib damage as well. She signed informed consent and agree to proceed forward. I was able to schedule her for simulation on Friday. Will then return to Dr. Welton Flakes for antiestrogen therapy. Regarding the abnormality on the left I did offer her ultrasound that she has declined. We will continue to monitor this.    Lurline Hare, MD

## 2012-02-23 ENCOUNTER — Ambulatory Visit: Payer: Medicare Other | Admitting: Physical Therapy

## 2012-02-23 ENCOUNTER — Encounter: Payer: Self-pay | Admitting: Medical Oncology

## 2012-02-23 ENCOUNTER — Telehealth: Payer: Self-pay | Admitting: Medical Oncology

## 2012-02-23 NOTE — Telephone Encounter (Signed)
Received fax from [facility] at [facility], will review with MD.

## 2012-02-24 ENCOUNTER — Ambulatory Visit: Payer: Medicare Other | Admitting: Radiation Oncology

## 2012-02-28 ENCOUNTER — Ambulatory Visit: Payer: Medicare Other | Admitting: Physical Therapy

## 2012-03-01 ENCOUNTER — Ambulatory Visit
Admission: RE | Admit: 2012-03-01 | Discharge: 2012-03-01 | Disposition: A | Discharge status: Home / Self Care | Payer: Medicare Other | Source: Ambulatory Visit | Attending: Radiation Oncology | Admitting: Radiation Oncology

## 2012-03-01 DIAGNOSIS — C50219 Malignant neoplasm of upper-inner quadrant of unspecified female breast: Secondary | ICD-10-CM

## 2012-03-01 DIAGNOSIS — C50919 Malignant neoplasm of unspecified site of unspecified female breast: Secondary | ICD-10-CM | POA: Insufficient documentation

## 2012-03-01 DIAGNOSIS — Z510 Encounter for antineoplastic radiation therapy: Secondary | ICD-10-CM | POA: Insufficient documentation

## 2012-03-01 DIAGNOSIS — M810 Age-related osteoporosis without current pathological fracture: Secondary | ICD-10-CM | POA: Insufficient documentation

## 2012-03-01 DIAGNOSIS — L299 Pruritus, unspecified: Secondary | ICD-10-CM | POA: Insufficient documentation

## 2012-03-01 NOTE — Progress Notes (Signed)
Name: Alexandria [name]   MRN: [id number]  Date:  03/01/2012  DOB: Jul 17, 1940  Status:outpatient    DIAGNOSIS: Breast cancer.  CONSENT VERIFIED: yes   SET UP: Patient is setup supine   IMMOBILIZATION:  The following immobilization was used:Custom Moldable Pillow, breast board.   NARRATIVE: [name] was brought to the CT Simulation planning suite.  Identity was confirmed.  All relevant records and images related to the planned course of therapy were reviewed.  Then, the patient was positioned in a stable reproducible clinical set-up for radiation therapy.  Wires were placed to delineate the clinical extent of breast tissue. A wire was placed on the scar as well.  CT images were obtained.  An isocenter was placed. Skin markings were placed.  The CT images were loaded into the planning software where the target and avoidance structures were contoured.  The radiation prescription was entered and confirmed. The patient was discharged in stable condition and tolerated simulation well.    TREATMENT PLANNING NOTE:  Treatment planning then occurred. I have requested : MLC's, isodose plan, basic dose calculation

## 2012-03-02 ENCOUNTER — Ambulatory Visit: Payer: Medicare Other | Admitting: Physical Therapy

## 2012-03-06 ENCOUNTER — Ambulatory Visit: Payer: Medicare Other | Admitting: Physical Therapy

## 2012-03-08 ENCOUNTER — Ambulatory Visit: Payer: Medicare Other | Admitting: Physical Therapy

## 2012-03-09 ENCOUNTER — Encounter: Payer: Self-pay | Admitting: Radiation Oncology

## 2012-03-09 ENCOUNTER — Ambulatory Visit
Admission: RE | Admit: 2012-03-09 | Discharge: 2012-03-09 | Disposition: A | Discharge status: Home / Self Care | Payer: Medicare Other | Source: Ambulatory Visit | Attending: Radiation Oncology | Admitting: Radiation Oncology

## 2012-03-12 ENCOUNTER — Ambulatory Visit
Admission: RE | Admit: 2012-03-12 | Discharge: 2012-03-12 | Disposition: A | Discharge status: Home / Self Care | Payer: Medicare Other | Source: Ambulatory Visit | Attending: Radiation Oncology | Admitting: Radiation Oncology

## 2012-03-12 DIAGNOSIS — C50219 Malignant neoplasm of upper-inner quadrant of unspecified female breast: Secondary | ICD-10-CM

## 2012-03-12 MED ORDER — RADIAPLEXRX EX GEL
Freq: Once | CUTANEOUS | Status: AC
  Administered 2012-03-12: 14:00:00 via TOPICAL

## 2012-03-12 MED ORDER — ALRA NON-METALLIC DEODORANT (RAD-ONC)
1.0000 "application " | Freq: Once | TOPICAL | Status: AC
  Administered 2012-03-12: 1 via TOPICAL

## 2012-03-12 NOTE — Progress Notes (Signed)
Post sim ed completed w/pt; gave pt "Radiaiton and You" booklet. All pertinent pages, information marked , discussed, re: fatigue, skin irritation/care, nutrition, pain.  Gave pt Radiaplex and Alra w/instructions for proper use. Pt verbalized no questions.

## 2012-03-13 ENCOUNTER — Ambulatory Visit
Admission: RE | Admit: 2012-03-13 | Discharge: 2012-03-13 | Disposition: A | Discharge status: Home / Self Care | Payer: Medicare Other | Source: Ambulatory Visit | Attending: Radiation Oncology | Admitting: Radiation Oncology

## 2012-03-13 ENCOUNTER — Encounter: Payer: Self-pay | Admitting: Radiation Oncology

## 2012-03-13 VITALS — BP 139/63 | HR 46 | Resp 16 | Wt 147.6 lb

## 2012-03-13 DIAGNOSIS — C50219 Malignant neoplasm of upper-inner quadrant of unspecified female breast: Secondary | ICD-10-CM

## 2012-03-13 NOTE — Progress Notes (Signed)
Patient presented to the clinic today for PUT with Dr. Michell Heinrich. Patient alert and oriented to person, place, and time. No distress noted. Steady gait noted. Pleasant affect noted. Patient denies pain at this time. Patient denies skin changes of right/treated breast. Patient reports using Radiaplex bid as directed. Patient reports energy level has not been effected. Patient's heart rate lower than normal. Encouraged patient to contact PCP. Patient reports that though her bp is within normal limits "its still high for her." Reported all findings to Dr. Michell Heinrich.

## 2012-03-13 NOTE — Progress Notes (Signed)
Weekly Management Note Current Dose:   2.5 Gy  Projected Dose:37.5  Gy   Narrative:  The patient presents for routine under treatment assessment.  CBCT/MVCT images/Port film x-rays were reviewed.  The chart was checked. Doing well. No complaints after 1st tx. PT helping with shoulder.   Physical Findings: Weight: 147 lb 9.6 oz (66.951 kg). Unchanged  Impression:  The patient is tolerating radiation.  Plan:  Continue treatment as planned. Continue PT and continue radiaplex.

## 2012-03-15 ENCOUNTER — Telehealth: Payer: Self-pay | Admitting: *Deleted

## 2012-03-15 ENCOUNTER — Encounter: Payer: Self-pay | Admitting: Oncology

## 2012-03-15 ENCOUNTER — Ambulatory Visit (HOSPITAL_BASED_OUTPATIENT_CLINIC_OR_DEPARTMENT_OTHER): Payer: PRIVATE HEALTH INSURANCE | Admitting: Oncology

## 2012-03-15 ENCOUNTER — Other Ambulatory Visit (HOSPITAL_BASED_OUTPATIENT_CLINIC_OR_DEPARTMENT_OTHER): Payer: PRIVATE HEALTH INSURANCE | Admitting: Lab

## 2012-03-15 ENCOUNTER — Ambulatory Visit: Payer: PRIVATE HEALTH INSURANCE | Attending: Oncology | Admitting: Physical Therapy

## 2012-03-15 ENCOUNTER — Ambulatory Visit
Admission: RE | Admit: 2012-03-15 | Discharge: 2012-03-15 | Disposition: A | Discharge status: Home / Self Care | Payer: Medicare Other | Source: Ambulatory Visit | Attending: Radiation Oncology | Admitting: Radiation Oncology

## 2012-03-15 VITALS — BP 148/84 | HR 59 | Temp 97.8°F | Resp 20 | Ht 64.0 in | Wt 149.0 lb

## 2012-03-15 DIAGNOSIS — M25619 Stiffness of unspecified shoulder, not elsewhere classified: Secondary | ICD-10-CM | POA: Insufficient documentation

## 2012-03-15 DIAGNOSIS — C50219 Malignant neoplasm of upper-inner quadrant of unspecified female breast: Secondary | ICD-10-CM

## 2012-03-15 DIAGNOSIS — C50919 Malignant neoplasm of unspecified site of unspecified female breast: Secondary | ICD-10-CM | POA: Insufficient documentation

## 2012-03-15 DIAGNOSIS — M25519 Pain in unspecified shoulder: Secondary | ICD-10-CM | POA: Insufficient documentation

## 2012-03-15 DIAGNOSIS — IMO0001 Reserved for inherently not codable concepts without codable children: Secondary | ICD-10-CM | POA: Insufficient documentation

## 2012-03-15 DIAGNOSIS — Z170 Estrogen receptor positive status [ER+]: Secondary | ICD-10-CM

## 2012-03-15 DIAGNOSIS — R293 Abnormal posture: Secondary | ICD-10-CM | POA: Insufficient documentation

## 2012-03-15 LAB — CBC WITH DIFFERENTIAL/PLATELET
BASO%: 1.1 % (ref 0.0–2.0)
Eosinophils Absolute: 0.1 10*3/uL (ref 0.0–0.5)
LYMPH%: 31.6 % (ref 14.0–49.7)
MCHC: 34.4 g/dL (ref 31.5–36.0)
MONO#: 0.5 10*3/uL (ref 0.1–0.9)
MONO%: 7.6 % (ref 0.0–14.0)
NEUT#: 3.5 10*3/uL (ref 1.5–6.5)
Platelets: 176 10*3/uL (ref 145–400)
RBC: 3.95 10*6/uL (ref 3.70–5.45)
RDW: 13.1 % (ref 11.2–14.5)
WBC: 6.1 10*3/uL (ref 3.9–10.3)

## 2012-03-15 LAB — COMPREHENSIVE METABOLIC PANEL (CC13)
ALT: 13 U/L (ref 0–55)
Albumin: 4 g/dL (ref 3.5–5.0)
Alkaline Phosphatase: 60 U/L (ref 40–150)
CO2: 28 mEq/L (ref 22–29)
Potassium: 4.2 mEq/L (ref 3.5–5.1)
Sodium: 144 mEq/L (ref 136–145)
Total Bilirubin: 0.99 mg/dL (ref 0.20–1.20)
Total Protein: 6.5 g/dL (ref 6.4–8.3)

## 2012-03-15 NOTE — Progress Notes (Signed)
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  PRIOR THERAPY:  #1 patient is status post lumpectomy with sentinel lymph node biopsy. 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  #2 patient's tumor was sent to Oncotype testing with a recurrence score of 9 putting her at risk for distant recurrence of only 7%. This is very acceptable  #3 patient will first proceed with radiation therapy this will then be followed by antiestrogen therapy consisting of an aromatase inhibitor.  CURRENT THERAPY: currently receiving  radiation therapy first  INTERVAL HISTORY: Alexandria [name] 72 y.o. female returns for followup visit she is actively getting radiation therapy to the breast. Overall she is tolerating it well without any significant complications. She is however little bit tired and she is developing some redness in the breast. She otherwise denies any fevers chills night sweats headaches no shortness of breath or chest pains or palpitations remainder of the 10 point review of systems is negative. MEDICAL HISTORY: Past Medical History  Diagnosis Date  . Allergy   . Hearing loss     bilateral hearing aids  . Hyperlipidemia   . GERD (gastroesophageal reflux disease)   . COPD (chronic obstructive pulmonary disease)   . 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 problems with balance  . IBS (irritable bowel syndrome)   . History of kidney stones   . 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 Traumatic hemopneumothorax 1982    left  . PVD (peripheral vascular disease)     varicose veins - left worse than right  . Anxiety   . Depression   . Hypertension     under control, has  been on med. since age 58  . Spinal stenosis   . Arthritis     left knee, hands, back  . Palpitations   . Abrasion of finger of left hand 12/29/2011    left middle   . Dental crowns present   . Breast cancer 12/2011    right  . Asthma     daily and prn inhalers    ALLERGIES:  is allergic to aspirin; chocolate; coffee bean extract; ibuprofen; oxycodone hcl; penicillins; shrimp; sulfonamide derivatives; augmentin; and eggs or egg-derived products.  MEDICATIONS:  Current Outpatient Prescriptions  Medication Sig Dispense Refill  . albuterol (PROVENTIL HFA;VENTOLIN HFA) 108 (90 BASE) MCG/ACT inhaler Inhale 2 puffs into the lungs every 6 (six) hours as needed. For shortness of breath and wheezing  18 g  3  . atenolol-chlorthalidone (TENORETIC 50) 50-25 MG per tablet Take 1 tablet by mouth daily.  100 tablet  3  . atorvastatin (LIPITOR) 20 MG tablet Take 1 tablet (20 mg total) by mouth daily. Twice weekly  100 tablet  3  . b complex vitamins tablet Take 1 tablet by mouth daily. PLUS FOLIC ACID PLUS VIT. C      . Cholecalciferol (VITAMIN D-3) 5000 UNITS TABS Take 1,000 Units by mouth daily.       . clopidogrel (PLAVIX) 75 MG tablet Take 1 tablet (75 mg total) by mouth daily.  30 tablet  6  . co-enzyme Q-10 50 MG capsule Take 200 mg by mouth daily.      . cyclobenzaprine (FLEXERIL) 5  MG tablet Take 5 mg by mouth 3 (three) times daily as needed. For muscle spasms      . escitalopram (LEXAPRO) 10 MG tablet Take 10 mg by mouth. 1/2 - 1 at bedtime      . fluticasone (FLONASE) 50 MCG/ACT nasal spray Place 2 sprays into the nose daily.  16 g  11  . Fluticasone-Salmeterol (ADVAIR DISKUS) 100-50 MCG/DOSE AEPB Inhale 1 puff into the lungs 2 (two) times daily at 10 AM and 5 PM.  60 each  11  . glucosamine-chondroitin 500-400 MG tablet Take 1 tablet by mouth 2 (two) times daily. 1500/1200 MG      . HYDROcodone-acetaminophen (NORCO) 10-325 MG per tablet Take 1 tablet by mouth every 6 (six) hours as needed  for pain.  20 tablet  0  . loratadine (CLARITIN) 10 MG tablet Take 10 mg by mouth daily.        . meloxicam (MOBIC) 15 MG tablet Take 2 tablets (30 mg total) by mouth daily.  100 tablet  3  . non-metallic deodorant (ALRA) MISC Apply 1 application topically daily as needed.      . pantoprazole (PROTONIX) 40 MG tablet TAKE 1 TABLET EACH DAY.  90 tablet  4  . potassium chloride SA (K-DUR,KLOR-CON) 20 MEQ tablet TAKE 3 TABLETS DAILY.  180 tablet  3  . Probiotic Product (ALIGN PO) Take 1 tablet by mouth daily.       Marland Kitchen selenium 50 MCG TABS Take by mouth daily. 200mg  qd      . Wound Cleansers (RADIAPLEX EX) Apply topically.        SURGICAL HISTORY:  Past Surgical History  Procedure Date  . Vein surgery     vein ablation left leg  . Knee surgery 1980s    left  . Orif ankle fracture 1980s    right   . Ankle hardware removal     right  . Tubal ligation 1976  . Cholecystectomy 1990s  . Transobturator sling 07/05/2005  . Cystoscopy 07/05/2005  . Breast biopsy 1976    right  . Breast lumpectomy 2013    right with snbx    REVIEW OF SYSTEMS:  Pertinent items are noted in HPI.   HEALTH MAINTENANCE:   PHYSICAL EXAMINATION: Blood pressure 148/84, pulse 59, temperature 97.8 F (36.6 C), resp. rate 20, height 5\' 4"  (1.626 m), weight 149 lb (67.586 kg). Body mass index is 25.58 kg/(m^2). ECOG PERFORMANCE STATUS: 0 - Asymptomatic   Well-developed nourished female in no acute distress HEENT exam EOMI PERRLA sclerae anicteric no conjunctival pallor oral mucosa is moist neck is supple lungs are clear bilaterally cardiovascular is regular rate rhythm abdomen is soft nontender nondistended bowel sounds are present no HSM extremities no edema neuro patient's alert oriented otherwise nonfocal. Breast exam right breast reveals well-healed incisional scar no masses no nipple discharge and retraction or inversion. Left breast no masses or nipple discharge   LABORATORY DATA: Lab Results  Component Value  Date   WBC 6.1 03/15/2012   HGB 12.2 03/15/2012   HCT 35.5 03/15/2012   MCV 89.9 03/15/2012   PLT 176 03/15/2012      Chemistry      Component Value Date/Time   NA 141 12/30/2011 1208   NA 142 12/28/2011 0836   K 3.8 12/30/2011 1208   K 3.6 12/28/2011 0836   CL 102 12/30/2011 1208   CL 106 12/28/2011 0836   CO2 30 12/30/2011 1208   CO2 28 12/28/2011 0836  BUN 20 12/30/2011 1208   BUN 20.0 12/28/2011 0836   CREATININE 0.86 12/30/2011 1208   CREATININE 0.9 12/28/2011 0836      Component Value Date/Time   CALCIUM 9.2 12/30/2011 1208   CALCIUM 9.3 12/28/2011 0836   ALKPHOS 56 12/28/2011 0836   ALKPHOS 43 06/23/2011 1522   AST 21 12/28/2011 0836   AST 21 06/23/2011 1522   ALT 17 12/28/2011 0836   ALT 16 06/23/2011 1522   BILITOT 0.80 12/28/2011 0836   BILITOT 1.2 06/23/2011 1522     1. A sample was sent to [facility] for oncotype testing. The patient's recurrence score is 9. Those patients who had a recurrence score of 9 had an average rate of distant recurrence of 7%. (JBK:gt, 02/02/12) Pecola Leisure MD Pathologist, Electronic Signature ( Signed 02/02/2012) 1. CHROMOGENIC IN-SITU HYBRIDIZATION Interpretation HER-2/NEU BY [facility] - NO AMPLIFICATION OF HER-2 DETECTED. THE RATIO OF HER-2: CEP 17 SIGNALS WAS 1.19. Reference range: Ratio: HER2:CEP17 < 1.8 - gene amplification not observed Ratio: HER2:CEP 17 1.8-2.2 - equivocal result Ratio: HER2:CEP17 > 2.2 - gene amplification observed Pecola Leisure MD Pathologist, Electronic Signature ( Signed 01/10/2012) FINAL DIAGNOSIS Diagnosis 1. Breast, lumpectomy, Right - INVASIVE DUCTAL CARCINOMA, GRADE I / III SPANNING 1.6 CM. - DUCTAL CARCINOMA IN SITU, LOW GRADE. 1 of 3 FINAL for Liska, Graceanna L ([id number]) Diagnosis(continued) - THE SURGICAL RESECTION MARGINS ARE NEGATIVE FOR CARCINOMA. - SEE ONCOLOGY TABLE BELOW. 2. Lymph node, sentinel, biopsy, Right - THERE IS NO EVIDENCE OF CARCINOMA IN 1 OF 1 LYMPH NODE (0/1). Microscopic  Comment 1. BREAST, INVASIVE TUMOR, WITH LYMPH NODE SAMPLING Specimen, including laterality: Right breast Procedure: Needle localized lumpectomy Grade: I Tubule formation: II Nuclear pleomorphism: II Mitotic: I Tumor size (glass slide measurement): 1.6 cm Margins: Greater than 0.3 cm to all margins (gross measurements) Lymphovascular invasion: Not identified Ductal carcinoma in situ: Present, low grade Extensive intraductal carcinoma: Not identified. Lobular neoplasia: Not identified Tumor focality: Unifocal Treatment effect: N/A Extent of tumor: Confined to breast parenchyma Lymph nodes: # examined: 1 Lymph nodes with metastasis: 0 Breast prognostic profile: Case [id number] Estrogen receptor: 100%, strong staining intensity Progesterone receptor: 0% Her 2 neu: No amplification was detected, the ratio was 1.14, Her 2 neu by [facility] will be repeated on the current case and the results reported separately. Ki-67: 27% Non-neoplastic breast: Healing biopsy site TNM: pT1c, pN0 Comments: The malignant cells are positive for e-cadherin. (JBK:caf 01/05/12) Pecola Leisure MD Pathologist, Electronic Signature (Case signed 01/06/2012) Specimen Gross and Clinical Information Specimen(s) Obtained: 1. Breast, lumpectomy, Right 2. Lymph node, sentinel, biopsy, Right Specimen Clinical Information 1. Right breast cancer  RADIOGRAPHIC STUDIES:  No results found.  ASSESSMENT: 72 year old female with  #1 stage I invasive ductal carcinoma measuring 1.6 cm ER positive PR negative HER-2/neu negative. She is status pos tright breast lumpectomy with sentinel lymph node biopsy. All sentinel nodes were negative for metastatic disease. Patient had Oncotype testing with a recurrence score of only 9 giving her a 7% distance recurrence with antiestrogen therapy only. Patient would not benefit from chemotherapy.  #2 recommendation is for patient to proceed with radiation therapy first then followed by  antiestrogen therapy consisting of an aromatase inhibitor for a total of 5 years. We discussed risks and benefits of of antiestrogen therapy as well as Oncotype testing.   PLAN:  #1 patient will continue with radiation therapy and I will plan on seeing her back after she has completed it.  #2 I have recommended that  she be seen by her primary care physician for her heart rate and blood pressure concerns.  All questions were answered. The patient knows to call the clinic with any problems, questions or concerns. We can certainly see the patient much sooner if necessary.  I spent 25 minutes counseling the patient face to face. The total time spent in the appointment was 30 minutes.    Drue Second, MD Medical/Oncology [facility] [phone] (beeper) [phone] (Office)  03/15/2012, 10:30 AM

## 2012-03-15 NOTE — Telephone Encounter (Signed)
Gave patient appointment for 05-04-2012 starting at 8:30am

## 2012-03-15 NOTE — Patient Instructions (Addendum)
Continue with radiation  Please see Dr. Tawanna Cooler for your heart rate and your blood pressure

## 2012-03-16 ENCOUNTER — Ambulatory Visit
Admission: RE | Admit: 2012-03-16 | Discharge: 2012-03-16 | Disposition: A | Discharge status: Home / Self Care | Payer: Medicare Other | Source: Ambulatory Visit | Attending: Radiation Oncology | Admitting: Radiation Oncology

## 2012-03-19 ENCOUNTER — Ambulatory Visit
Admission: RE | Admit: 2012-03-19 | Discharge: 2012-03-19 | Disposition: A | Discharge status: Home / Self Care | Payer: Medicare Other | Source: Ambulatory Visit | Attending: Radiation Oncology | Admitting: Radiation Oncology

## 2012-03-19 ENCOUNTER — Encounter: Payer: Self-pay | Admitting: Physical Therapy

## 2012-03-20 ENCOUNTER — Ambulatory Visit: Payer: PRIVATE HEALTH INSURANCE

## 2012-03-20 ENCOUNTER — Encounter: Payer: Self-pay | Admitting: Radiation Oncology

## 2012-03-20 ENCOUNTER — Ambulatory Visit
Admission: RE | Admit: 2012-03-20 | Discharge: 2012-03-20 | Disposition: A | Discharge status: Home / Self Care | Payer: Medicare Other | Source: Ambulatory Visit | Attending: Radiation Oncology | Admitting: Radiation Oncology

## 2012-03-20 VITALS — BP 135/72 | HR 53 | Resp 16 | Wt 147.1 lb

## 2012-03-20 DIAGNOSIS — C50219 Malignant neoplasm of upper-inner quadrant of unspecified female breast: Secondary | ICD-10-CM

## 2012-03-20 NOTE — Progress Notes (Signed)
Patient presents to the clinic today unaccompanied for PUT with Dr. Michell Heinrich. Patient is alert and oriented to person, place, and time. No distress noted. Steady gait noted. Pleasant affect noted. Patient denies pain at this time. No skin changes to right/treated breast noted. Patient reports using radiaplex bid as directed. Patient denies fatigue at this time. Reported all findings to Dr. Michell Heinrich.

## 2012-03-20 NOTE — Progress Notes (Signed)
Weekly Management Note Current Dose: 12.5  Gy  Projected Dose: 37.5 Gy   Narrative:  The patient presents for routine under treatment assessment.  CBCT/MVCT images/Port film x-rays were reviewed.  The chart was checked. Doing well. Pleased with her shoulder rehab and steroid patch.   Physical Findings: Weight: 147 lb 1.6 oz (66.724 kg). Unchanged  Impression:  The patient is tolerating radiation.  Plan:  Continue treatment as planned. Pt is concerned re: calcium supplements she took before for osteoporosis that caused kidney stones. Will discuss with Dr. Welton Flakes prior to starting AI. Continue radiaplex.

## 2012-03-21 ENCOUNTER — Encounter: Payer: Self-pay | Admitting: Family Medicine

## 2012-03-21 ENCOUNTER — Ambulatory Visit (INDEPENDENT_AMBULATORY_CARE_PROVIDER_SITE_OTHER): Payer: PRIVATE HEALTH INSURANCE | Admitting: Family Medicine

## 2012-03-21 ENCOUNTER — Ambulatory Visit
Admission: RE | Admit: 2012-03-21 | Discharge: 2012-03-21 | Disposition: A | Discharge status: Home / Self Care | Payer: Medicare Other | Source: Ambulatory Visit | Attending: Radiation Oncology | Admitting: Radiation Oncology

## 2012-03-21 VITALS — BP 150/80 | HR 58 | Temp 98.9°F | Wt 148.0 lb

## 2012-03-21 DIAGNOSIS — M549 Dorsalgia, unspecified: Secondary | ICD-10-CM

## 2012-03-21 DIAGNOSIS — IMO0002 Reserved for concepts with insufficient information to code with codable children: Secondary | ICD-10-CM

## 2012-03-21 DIAGNOSIS — R002 Palpitations: Secondary | ICD-10-CM

## 2012-03-21 MED ORDER — AMLODIPINE BESYLATE 5 MG PO TABS: 5.0000 mg | ORAL_TABLET | Freq: Every day | ORAL | Status: DC

## 2012-03-21 MED ORDER — ESCITALOPRAM OXALATE 10 MG PO TABS: 10.0000 mg | ORAL_TABLET | ORAL | Status: DC

## 2012-03-21 NOTE — Progress Notes (Signed)
  Subjective:    Patient ID: Alexandria [name], female    DOB: April 05, 1940, 72 y.o.   MRN: [id number]  HPI Treniyah is a 72 year old married female X. smoker who comes in today for evaluation of 2 problems  She was referred here by her oncologist because her blood pressure was elevated and her heart rate has been dropping. She says her pulse sometimes drops below 50. BP today 150/80. She takes Tenoretic 50-25 daily.  She's currently undergoing radiation treatment for a right breast cancer  She's also under therapy for a frozen right shoulder this is being done in physical therapy.  In the past 6 months she's had 2 episodes where she had severe pain in her neck radiating down into her arms. She noticed some numbness and tingling in the fourth and fifth fingers of her hand. She's had a history of lumbar disc disease. Both episodes lasted for a short period of time and were not related to exertion. No cardiac or pulmonary symptoms   Review of Systems General cardiovascular review of systems otherwise negative except for an occasional skipping of her heart rate    Objective:   Physical Exam  Well-developed well-nourished female no acute distress lungs are clear to auscultation cardiac exam normal BP 150/80 pulse was 60 and regular with no arrhythmia      Assessment & Plan:  Hypertension not at goal and bradycardia plan cut beta blocker in half at 5 mg of Norvasc BP check daily followup in 2 weeks  Cervical disc disease observe  Frozen shoulder right

## 2012-03-21 NOTE — Patient Instructions (Addendum)
Tenoretic 50/25,,,,,,,,,, decrease dose to one half tablets daily in the morning  Add Norvasc 5 mg daily in the morning  BP check every morning followup in 3 weeks

## 2012-03-22 ENCOUNTER — Encounter: Payer: Self-pay | Admitting: Physical Therapy

## 2012-03-22 ENCOUNTER — Ambulatory Visit: Payer: PRIVATE HEALTH INSURANCE | Admitting: Physical Therapy

## 2012-03-22 ENCOUNTER — Ambulatory Visit
Admission: RE | Admit: 2012-03-22 | Discharge: 2012-03-22 | Disposition: A | Discharge status: Home / Self Care | Payer: Medicare Other | Source: Ambulatory Visit | Attending: Radiation Oncology | Admitting: Radiation Oncology

## 2012-03-23 ENCOUNTER — Encounter: Payer: Self-pay | Admitting: Family Medicine

## 2012-03-23 ENCOUNTER — Encounter: Payer: Self-pay | Admitting: Radiation Oncology

## 2012-03-23 ENCOUNTER — Ambulatory Visit
Admission: RE | Admit: 2012-03-23 | Discharge: 2012-03-23 | Disposition: A | Discharge status: Home / Self Care | Payer: Medicare Other | Source: Ambulatory Visit | Attending: Radiation Oncology | Admitting: Radiation Oncology

## 2012-03-23 VITALS — BP 137/71 | HR 55 | Resp 18 | Wt 148.4 lb

## 2012-03-23 DIAGNOSIS — C50219 Malignant neoplasm of upper-inner quadrant of unspecified female breast: Secondary | ICD-10-CM

## 2012-03-23 NOTE — Progress Notes (Signed)
Patient presents to the clinic today unaccompanied for PUT with Dr. Michell Heinrich. Patient alert and oriented to person, place, and time. No distress noted. Steady gait noted. Pleasant affect noted. Patient denies pain at this time. Patient reports that her PCP adjusted her blood pressure medication. Medication has been appropriately documented. Patient to follow up with PCP again January 30th. No skin changes of right breast noted. Patient denies breast pain. Patient reports using radiaplex bid as directed. Patient has no complaints at this time. Patient reports mild decrease of energy level. Patient reports that Wednesday she slept 16 hours. Reported all findings to Dr. Michell Heinrich.

## 2012-03-23 NOTE — Progress Notes (Signed)
Weekly Management Note Current Dose: 20  Gy  Projected Dose: 37.5 Gy   Narrative:  The patient presents for routine under treatment assessment.  CBCT/MVCT images/Port film x-rays were reviewed.  The chart was checked. Doing well. No complaints. Feeling better after talking to her PCP. Skin is slightly irritated in her axilla.   Physical Findings: Weight: 148 lb 6.4 oz (67.314 kg). Slight pink skin laterally.   Impression:  The patient is tolerating radiation.  Plan:  Continue treatment as planned. Continue radiaplex.

## 2012-03-26 ENCOUNTER — Ambulatory Visit
Admission: RE | Admit: 2012-03-26 | Discharge: 2012-03-26 | Disposition: A | Discharge status: Home / Self Care | Payer: Medicare Other | Source: Ambulatory Visit | Attending: Radiation Oncology | Admitting: Radiation Oncology

## 2012-03-27 ENCOUNTER — Encounter: Payer: Self-pay | Admitting: Physical Therapy

## 2012-03-27 ENCOUNTER — Ambulatory Visit: Payer: Medicare Other

## 2012-03-27 ENCOUNTER — Ambulatory Visit: Payer: PRIVATE HEALTH INSURANCE

## 2012-03-28 ENCOUNTER — Ambulatory Visit
Admission: RE | Admit: 2012-03-28 | Discharge: 2012-03-28 | Disposition: A | Discharge status: Home / Self Care | Payer: Medicare Other | Source: Ambulatory Visit | Attending: Radiation Oncology | Admitting: Radiation Oncology

## 2012-03-29 ENCOUNTER — Ambulatory Visit
Admission: RE | Admit: 2012-03-29 | Discharge: 2012-03-29 | Disposition: A | Discharge status: Home / Self Care | Payer: Medicare Other | Source: Ambulatory Visit | Attending: Radiation Oncology | Admitting: Radiation Oncology

## 2012-03-29 ENCOUNTER — Encounter: Payer: Self-pay | Admitting: Physical Therapy

## 2012-03-29 ENCOUNTER — Encounter (INDEPENDENT_AMBULATORY_CARE_PROVIDER_SITE_OTHER): Payer: Self-pay

## 2012-03-29 DIAGNOSIS — C50219 Malignant neoplasm of upper-inner quadrant of unspecified female breast: Secondary | ICD-10-CM

## 2012-03-29 NOTE — Progress Notes (Signed)
Weekly Management Note Current Dose: 30  Gy  Projected Dose: 40 Gy   Narrative:  The patient presents for routine under treatment assessment.  CBCT/MVCT images/Port film x-rays were reviewed.  The chart was checked. Doing well. Energy improved after chanign bp meds. No skin related complaints   Physical Findings: Weight:  . Miled dermatitis medially.   Impression:  The patient is tolerating radiation.  Plan:  Continue treatment as planned.

## 2012-03-30 ENCOUNTER — Ambulatory Visit: Payer: PRIVATE HEALTH INSURANCE

## 2012-03-30 ENCOUNTER — Ambulatory Visit
Admission: RE | Admit: 2012-03-30 | Discharge: 2012-03-30 | Disposition: A | Discharge status: Home / Self Care | Payer: Medicare Other | Source: Ambulatory Visit | Attending: Radiation Oncology | Admitting: Radiation Oncology

## 2012-04-02 ENCOUNTER — Ambulatory Visit
Admission: RE | Admit: 2012-04-02 | Discharge: 2012-04-02 | Disposition: A | Discharge status: Home / Self Care | Payer: Medicare Other | Source: Ambulatory Visit | Attending: Radiation Oncology | Admitting: Radiation Oncology

## 2012-04-03 ENCOUNTER — Ambulatory Visit: Payer: PRIVATE HEALTH INSURANCE

## 2012-04-03 ENCOUNTER — Ambulatory Visit
Admission: RE | Admit: 2012-04-03 | Discharge: 2012-04-03 | Disposition: A | Discharge status: Home / Self Care | Payer: Medicare Other | Source: Ambulatory Visit | Attending: Radiation Oncology | Admitting: Radiation Oncology

## 2012-04-03 ENCOUNTER — Encounter: Payer: Self-pay | Admitting: Radiation Oncology

## 2012-04-03 VITALS — BP 136/63 | HR 51 | Temp 98.0°F | Resp 20 | Wt 149.0 lb

## 2012-04-03 DIAGNOSIS — C50219 Malignant neoplasm of upper-inner quadrant of unspecified female breast: Secondary | ICD-10-CM

## 2012-04-03 MED ORDER — BIAFINE EX EMUL
Freq: Two times a day (BID) | CUTANEOUS | Status: DC
  Administered 2012-04-03: 14:00:00 via TOPICAL

## 2012-04-03 NOTE — Progress Notes (Signed)
Pt denies pain, c/o itchiness in tx area. Gave her Biafine cream per Dr Michell Heinrich. Pt may also apply Cortisone cream 1%.

## 2012-04-03 NOTE — Progress Notes (Signed)
Weekly Management Note Current Dose: 40  Gy  Projected Dose: 52.5 Gy   Narrative:  The patient presents for routine under treatment assessment.  CBCT/MVCT images/Port film x-rays were reviewed.  The chart was checked. Doing well. Some skin itching medially. Verified electron boost on the treatment machine  Physical Findings: Weight: 149 lb (67.586 kg). Unchanged. Dermatitis medially.   Impression:  The patient is tolerating radiation.  Plan:  Continue treatment as planned. Switch to biafene and add hydrocortisone prn.

## 2012-04-04 ENCOUNTER — Ambulatory Visit: Payer: Medicare Other

## 2012-04-05 ENCOUNTER — Ambulatory Visit: Payer: PRIVATE HEALTH INSURANCE | Admitting: Physical Therapy

## 2012-04-05 ENCOUNTER — Ambulatory Visit
Admission: RE | Admit: 2012-04-05 | Discharge: 2012-04-05 | Disposition: A | Discharge status: Home / Self Care | Payer: Medicare Other | Source: Ambulatory Visit | Attending: Radiation Oncology | Admitting: Radiation Oncology

## 2012-04-06 ENCOUNTER — Ambulatory Visit
Admission: RE | Admit: 2012-04-06 | Discharge: 2012-04-06 | Disposition: A | Discharge status: Home / Self Care | Payer: Medicare Other | Source: Ambulatory Visit | Attending: Radiation Oncology | Admitting: Radiation Oncology

## 2012-04-09 ENCOUNTER — Ambulatory Visit
Admission: RE | Admit: 2012-04-09 | Discharge: 2012-04-09 | Disposition: A | Discharge status: Home / Self Care | Payer: Medicare Other | Source: Ambulatory Visit | Attending: Radiation Oncology | Admitting: Radiation Oncology

## 2012-04-10 ENCOUNTER — Ambulatory Visit: Payer: Medicare Other

## 2012-04-10 ENCOUNTER — Ambulatory Visit
Admission: RE | Admit: 2012-04-10 | Discharge: 2012-04-10 | Disposition: A | Discharge status: Home / Self Care | Payer: Medicare Other | Source: Ambulatory Visit | Attending: Radiation Oncology | Admitting: Radiation Oncology

## 2012-04-10 ENCOUNTER — Encounter: Payer: Self-pay | Admitting: Radiation Oncology

## 2012-04-10 ENCOUNTER — Ambulatory Visit: Payer: PRIVATE HEALTH INSURANCE

## 2012-04-10 VITALS — BP 125/62 | HR 62 | Temp 98.1°F | Resp 20 | Wt 148.3 lb

## 2012-04-10 DIAGNOSIS — C50219 Malignant neoplasm of upper-inner quadrant of unspecified female breast: Secondary | ICD-10-CM

## 2012-04-10 NOTE — Progress Notes (Signed)
Pt applying Biafine and Cortisone cream for itchiness w/fair relief. She denies pain, loss of appetite. She is sleeping longer hours at night. Pt completes this week, gave her FU card.

## 2012-04-10 NOTE — Progress Notes (Signed)
Weekly Management Note Current Dose: 47.5  Gy  Projected Dose: 50 Gy   Narrative:  The patient presents for routine under treatment assessment.  CBCT/MVCT images/Port film x-rays were reviewed.  The chart was checked. Doing well. Itching continues with some relief from Biafene and cortisone.   Physical Findings: Weight: 148 lb 4.8 oz (67.268 kg). Dermatitis medially. Inframammary folds are clear  Impression:  The patient is tolerating radiation.  Plan:  Continue treatment as planned. Continue biafene and hydrocortisone.

## 2012-04-11 ENCOUNTER — Ambulatory Visit: Payer: Medicare Other

## 2012-04-12 ENCOUNTER — Ambulatory Visit: Payer: PRIVATE HEALTH INSURANCE

## 2012-04-12 ENCOUNTER — Ambulatory Visit (INDEPENDENT_AMBULATORY_CARE_PROVIDER_SITE_OTHER): Payer: PRIVATE HEALTH INSURANCE | Admitting: Family Medicine

## 2012-04-12 ENCOUNTER — Ambulatory Visit
Admission: RE | Admit: 2012-04-12 | Discharge: 2012-04-12 | Disposition: A | Discharge status: Home / Self Care | Payer: Medicare Other | Source: Ambulatory Visit | Attending: Radiation Oncology | Admitting: Radiation Oncology

## 2012-04-12 ENCOUNTER — Encounter: Payer: Self-pay | Admitting: Family Medicine

## 2012-04-12 ENCOUNTER — Other Ambulatory Visit: Payer: Medicare Other | Admitting: Lab

## 2012-04-12 ENCOUNTER — Ambulatory Visit: Payer: Medicare Other | Admitting: Oncology

## 2012-04-12 VITALS — BP 140/78 | Temp 98.1°F | Wt 150.0 lb

## 2012-04-12 DIAGNOSIS — R002 Palpitations: Secondary | ICD-10-CM

## 2012-04-12 DIAGNOSIS — IMO0002 Reserved for concepts with insufficient information to code with codable children: Secondary | ICD-10-CM

## 2012-04-12 DIAGNOSIS — R279 Unspecified lack of coordination: Secondary | ICD-10-CM

## 2012-04-12 DIAGNOSIS — R270 Ataxia, unspecified: Secondary | ICD-10-CM

## 2012-04-12 MED ORDER — ATENOLOL 25 MG PO TABS: ORAL_TABLET | ORAL | Status: DC

## 2012-04-12 MED ORDER — HYDROCHLOROTHIAZIDE 12.5 MG PO TABS: ORAL_TABLET | ORAL | Status: DC

## 2012-04-12 NOTE — Patient Instructions (Signed)
Atenolol 25 mg,,,,,,,,,,,, one half tab every morning  Check blood pressure 3 times weekly  Blood pressure goal 135/85  We will set you up a consult in neurology  Hypo-thiazide 12.5 mg...........Marland Kitchen 1 tab every morning when necessary

## 2012-04-12 NOTE — Progress Notes (Signed)
  Subjective:    Patient ID: Alexandria [name], female    DOB: March 02, 1941, 72 y.o.   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 She had one episode were was 90/70. Pulse is 60 and regular.  She was in the process of having a neurologic evaluation by Dr. Mauri Reading  for ataxia when he moved to [facility]. She would like that pursued. She states that he had ordered a 2-D echocardiogram?????????????? 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

## 2012-04-13 ENCOUNTER — Ambulatory Visit
Admission: RE | Admit: 2012-04-13 | Discharge: 2012-04-13 | Disposition: A | Discharge status: Home / Self Care | Payer: Medicare Other | Source: Ambulatory Visit | Attending: Radiation Oncology | Admitting: Radiation Oncology

## 2012-04-13 ENCOUNTER — Encounter: Payer: Self-pay | Admitting: Radiation Oncology

## 2012-04-16 NOTE — Progress Notes (Signed)
  Radiation Oncology         [phone] ________________________________  Name: Alexandria [name] MRN: [id number]  Date: 03/09/2012  DOB: Jul 02, 1940  Simulation Verification Note  Status: outpatient  NARRATIVE: The patient was brought to the treatment unit and placed in the planned treatment position. The clinical setup was verified. Then port films were obtained and uploaded to the radiation oncology medical record software.  The treatment beams were carefully compared against the planned radiation fields. The position location and shape of the radiation fields was reviewed. The targeted volume of tissue appears appropriately covered by the radiation beams. Organs at risk appear to be excluded as planned.  Based on my personal review, I approved the simulation verification. The patient's treatment will proceed as planned.  ------------------------------------------------  Lurline Hare, MD

## 2012-04-16 NOTE — Progress Notes (Signed)
Name: Alexandria [name]   MRN: [id number]  Date:  03/23/2012    DOB: 05-Jan-1941  Status:outpatient    DIAGNOSIS: Breast cancer.  CONSENT VERIFIED: yes   SET UP: Patient is setup supine   IMMOBILIZATION:  The following immobilization was used:Custom Moldable Pillow, breast board.   NARRATIVE: [name] underwent complex simulation and treatment planning for her boost treatment today.  Her tumor volume was outlined on the planning CT scan.   9  MeV electrons will be prescribed to the 92% Isodose line.   A block will be used for beam modification purposes.  A special port plan is requested.

## 2012-04-16 NOTE — Progress Notes (Signed)
°  Radiation Oncology         [phone] ________________________________  Name: Alexandria [name] MRN: [id number]  Date: 04/13/2012  DOB: Nov 06, 1940  End of Treatment Note  Diagnosis:   T1 C. N0 grade 1 invasive ductal carcinoma, negative margins   Indication for treatment:  Curative       Radiation treatment dates:   03/12/2012-04/13/2012  Site/dose:  Right breast / 37.5 in 15 fractions at 2.5 Gy per fraction Right breast boost / 12.5 Gy in 5 fractions @2 .5 Gy per fraction  Beams/energy:   Opposed tangents / 6 MV En face electrons /  Narrative: The patient tolerated radiation treatment relatively well.   She had the expected skin toxicity which was minimal.   Plan: The patient has completed radiation treatment. The patient will return to radiation oncology clinic for routine followup in one month. I advised them to call or return sooner if they have any questions or concerns related to their recovery or treatment.  ------------------------------------------------  Lurline Hare, MD

## 2012-05-04 ENCOUNTER — Other Ambulatory Visit: Payer: Self-pay | Admitting: Lab

## 2012-05-04 ENCOUNTER — Ambulatory Visit: Payer: Self-pay | Admitting: Oncology

## 2012-05-08 ENCOUNTER — Other Ambulatory Visit (HOSPITAL_BASED_OUTPATIENT_CLINIC_OR_DEPARTMENT_OTHER): Payer: Medicare Other | Admitting: Lab

## 2012-05-08 ENCOUNTER — Encounter: Payer: Self-pay | Admitting: Oncology

## 2012-05-08 ENCOUNTER — Ambulatory Visit (HOSPITAL_BASED_OUTPATIENT_CLINIC_OR_DEPARTMENT_OTHER): Payer: Medicare Other | Admitting: Oncology

## 2012-05-08 VITALS — BP 118/74 | HR 60 | Temp 98.3°F | Resp 20 | Ht 64.0 in | Wt 150.4 lb

## 2012-05-08 DIAGNOSIS — C50219 Malignant neoplasm of upper-inner quadrant of unspecified female breast: Secondary | ICD-10-CM

## 2012-05-08 DIAGNOSIS — Z923 Personal history of irradiation: Secondary | ICD-10-CM | POA: Insufficient documentation

## 2012-05-08 DIAGNOSIS — C50211 Malignant neoplasm of upper-inner quadrant of right female breast: Secondary | ICD-10-CM

## 2012-05-08 DIAGNOSIS — Z170 Estrogen receptor positive status [ER+]: Secondary | ICD-10-CM

## 2012-05-08 DIAGNOSIS — Z79811 Long term (current) use of aromatase inhibitors: Secondary | ICD-10-CM | POA: Insufficient documentation

## 2012-05-08 HISTORY — DX: Long term (current) use of aromatase inhibitors: Z79.811

## 2012-05-08 LAB — COMPREHENSIVE METABOLIC PANEL (CC13)
ALT: 22 U/L (ref 0–55)
AST: 29 U/L (ref 5–34)
Alkaline Phosphatase: 63 U/L (ref 40–150)
Glucose: 90 mg/dl (ref 70–99)
Potassium: 4.1 mEq/L (ref 3.5–5.1)
Sodium: 143 mEq/L (ref 136–145)
Total Bilirubin: 1.4 mg/dL — ABNORMAL HIGH (ref 0.20–1.20)
Total Protein: 6.7 g/dL (ref 6.4–8.3)

## 2012-05-08 LAB — CBC WITH DIFFERENTIAL/PLATELET
BASO%: 1.2 % (ref 0.0–2.0)
EOS%: 2.2 % (ref 0.0–7.0)
LYMPH%: 30.3 % (ref 14.0–49.7)
MCHC: 34.2 g/dL (ref 31.5–36.0)
MCV: 88.8 fL (ref 79.5–101.0)
MONO%: 10.1 % (ref 0.0–14.0)
Platelets: 165 10*3/uL (ref 145–400)
RBC: 4.15 10*6/uL (ref 3.70–5.45)
RDW: 12.9 % (ref 11.2–14.5)

## 2012-05-08 MED ORDER — ANASTROZOLE 1 MG PO TABS: 1.0000 mg | ORAL_TABLET | Freq: Every day | ORAL | Status: AC

## 2012-05-08 NOTE — Progress Notes (Signed)
OFFICE PROGRESS NOTE  CC  [name],JEFFREY [name], MD [address] [place] [address] Dr. Lurline Hare Dr. 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  PRIOR THERAPY:  #1 patient is status post lumpectomy with sentinel lymph node biopsy. 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  #2 patient's tumor was sent to Oncotype testing with a recurrence score of 9 putting her at risk for distant recurrence of only 7%. This is very acceptable  #3 patient will first proceed with radiation therapy this will then be followed by antiestrogen therapy consisting of an aromatase inhibitor.patient has completed her radiation as of January 2014.  #4 proceed with Arimidex 1 mg daily starting 05/08/2012. Total of 5 years of therapy is planned.  CURRENT THERAPY: Arimidex 1 mg daily  INTERVAL HISTORY: Alexandria [name] 72 y.o. female returns for followup visit after completion of radiation therapy. She tolerated the radiation well without any problems. Her skin is healing very nicely. She denies any nausea vomiting fevers chills night sweats headaches shortness of breath chest pains or palpitations no myalgias and arthralgias no aches and pains.  MEDICAL HISTORY: Past Medical History  Diagnosis Date  . Allergy   . Hearing loss     bilateral hearing aids  . Hyperlipidemia   . GERD (gastroesophageal reflux disease)   . COPD (chronic obstructive pulmonary disease)   . 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 problems with balance  . IBS (irritable bowel syndrome)   . History of kidney stones   . 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 Traumatic hemopneumothorax 1982    left  . PVD (peripheral vascular disease)     varicose veins - left worse  than right  . Anxiety   . Depression   . Hypertension     under control, has been on med. since age 77  . Spinal stenosis   . Arthritis     left knee, hands, back  . Palpitations   . Abrasion of finger of left hand 12/29/2011    left middle   . Dental crowns present   . Breast cancer 12/2011    right  . Asthma     daily and prn inhalers    ALLERGIES:  is allergic to aspirin; chocolate; coffee bean extract; ibuprofen; oxycodone hcl; penicillins; shrimp; sulfonamide derivatives; augmentin; and eggs or egg-derived products.  MEDICATIONS:  Current Outpatient Prescriptions  Medication Sig Dispense Refill  . albuterol (PROVENTIL HFA;VENTOLIN HFA) 108 (90 BASE) MCG/ACT inhaler Inhale 2 puffs into the lungs every 6 (six) hours as needed. For shortness of breath and wheezing  18 g  3  . amLODipine (NORVASC) 5 MG tablet Take 1 tablet (5 mg total) by mouth daily.  90 tablet  3  . atenolol (TENORMIN) 25 MG tablet One half tab every morning  90 tablet  3  . atorvastatin (LIPITOR) 20 MG tablet Take 1 tablet (20 mg total) by mouth daily. Twice weekly  100 tablet  3  . b complex vitamins tablet Take 1 tablet by mouth daily. PLUS FOLIC ACID PLUS VIT. C      . Biotin 5000 MCG TABS Take by mouth.      . Cholecalciferol (VITAMIN D-3) 5000 UNITS TABS Take 1,000 Units by mouth daily.       Marland Kitchen  clopidogrel (PLAVIX) 75 MG tablet Take 1 tablet (75 mg total) by mouth daily.  30 tablet  6  . co-enzyme Q-10 50 MG capsule Take 200 mg by mouth daily.      Marland Kitchen escitalopram (LEXAPRO) 10 MG tablet as directed. 1/2 - 1 at bedtime      . fluticasone (FLONASE) 50 MCG/ACT nasal spray Place 2 sprays into the nose daily.  16 g  11  . Fluticasone-Salmeterol (ADVAIR DISKUS) 100-50 MCG/DOSE AEPB Inhale 1 puff into the lungs 2 (two) times daily at 10 AM and 5 PM.  60 each  11  . glucosamine-chondroitin 500-400 MG tablet Take 1 tablet by mouth 2 (two) times daily. 1500/1200 MG      . hydrochlorothiazide (HYDRODIURIL) 12.5 MG  tablet 1 by mouth every morning when necessary  90 tablet  3  . loratadine (CLARITIN) 10 MG tablet Take 10 mg by mouth daily.        . LUTEIN PO Take by mouth.      . meloxicam (MOBIC) 15 MG tablet Take 2 tablets (30 mg total) by mouth daily.  100 tablet  3  . pantoprazole (PROTONIX) 40 MG tablet TAKE 1 TABLET EACH DAY.  90 tablet  4  . potassium chloride SA (K-DUR,KLOR-CON) 20 MEQ tablet TAKE 3 TABLETS DAILY.  180 tablet  3  . Probiotic Product (ALIGN PO) Take 1 tablet by mouth daily.       Marland Kitchen selenium 50 MCG TABS Take by mouth daily. 200mg  qd      . cyclobenzaprine (FLEXERIL) 5 MG tablet Take 5 mg by mouth 3 (three) times daily as needed. For muscle spasms      . emollient (BIAFINE) cream Apply topically 2 (two) times daily.      Marland Kitchen HYDROcodone-acetaminophen (NORCO) 10-325 MG per tablet Take 1 tablet by mouth every 6 (six) hours as needed for pain.  20 tablet  0  . Wound Cleansers (RADIAPLEX EX) Apply topically.       No current facility-administered medications for this visit.    SURGICAL HISTORY:  Past Surgical History  Procedure Laterality Date  . Vein surgery      vein ablation left leg  . Knee surgery  1980s    left  . Orif ankle fracture  1980s    right   . Ankle hardware removal      right  . Tubal ligation  1976  . Cholecystectomy  1990s  . Transobturator sling  07/05/2005  . Cystoscopy  07/05/2005  . Breast biopsy  1976    right  . Breast lumpectomy  2013    right with snbx    REVIEW OF SYSTEMS:  Pertinent items are noted in HPI.   HEALTH MAINTENANCE:   PHYSICAL EXAMINATION: Blood pressure 118/74, pulse 60, temperature 98.3 F (36.8 C), temperature source Oral, resp. rate 20, height 5\' 4"  (1.626 m), weight 150 lb 6.4 oz (68.221 kg). Body mass index is 25.8 kg/(m^2). ECOG PERFORMANCE STATUS: 0 - Asymptomatic   Well-developed nourished female in no acute distress HEENT exam EOMI PERRLA sclerae anicteric no conjunctival pallor oral mucosa is moist neck is supple  lungs are clear bilaterally cardiovascular is regular rate rhythm abdomen is soft nontender nondistended bowel sounds are present no HSM extremities no edema neuro patient's alert oriented otherwise nonfocal. Breast exam right breast reveals well-healed incisional scar no masses no nipple discharge and retraction or inversion. Left breast no masses or nipple discharge   LABORATORY DATA: Lab Results  Component Value Date   WBC 4.3 05/08/2012   HGB 12.6 05/08/2012   HCT 36.8 05/08/2012   MCV 88.8 05/08/2012   PLT 165 05/08/2012      Chemistry      Component Value Date/Time   NA 143 05/08/2012 0824   NA 141 12/30/2011 1208   K 4.1 05/08/2012 0824   K 3.8 12/30/2011 1208   CL 105 05/08/2012 0824   CL 102 12/30/2011 1208   CO2 28 05/08/2012 0824   CO2 30 12/30/2011 1208   BUN 13.4 05/08/2012 0824   BUN 20 12/30/2011 1208   CREATININE 0.9 05/08/2012 0824   CREATININE 0.86 12/30/2011 1208      Component Value Date/Time   CALCIUM 9.4 05/08/2012 0824   CALCIUM 9.2 12/30/2011 1208   ALKPHOS 63 05/08/2012 0824   ALKPHOS 43 06/23/2011 1522   AST 29 05/08/2012 0824   AST 21 06/23/2011 1522   ALT 22 05/08/2012 0824   ALT 16 06/23/2011 1522   BILITOT 1.40* 05/08/2012 0824   BILITOT 1.2 06/23/2011 1522     1. A sample was sent to [facility] for oncotype testing. The patient's recurrence score is 9. Those patients who had a recurrence score of 9 had an average rate of distant recurrence of 7%. (JBK:gt, 02/02/12) Pecola Leisure MD Pathologist, Electronic Signature ( Signed 02/02/2012) 1. CHROMOGENIC IN-SITU HYBRIDIZATION Interpretation HER-2/NEU BY [facility] - NO AMPLIFICATION OF HER-2 DETECTED. THE RATIO OF HER-2: CEP 17 SIGNALS WAS 1.19. Reference range: Ratio: HER2:CEP17 < 1.8 - gene amplification not observed Ratio: HER2:CEP 17 1.8-2.2 - equivocal result Ratio: HER2:CEP17 > 2.2 - gene amplification observed Pecola Leisure MD Pathologist, Electronic Signature ( Signed 01/10/2012) FINAL  DIAGNOSIS Diagnosis 1. Breast, lumpectomy, Right - INVASIVE DUCTAL CARCINOMA, GRADE I / III SPANNING 1.6 CM. - DUCTAL CARCINOMA IN SITU, LOW GRADE. 1 of 3 FINAL for Elsass, Ragina L ([id number]) Diagnosis(continued) - THE SURGICAL RESECTION MARGINS ARE NEGATIVE FOR CARCINOMA. - SEE ONCOLOGY TABLE BELOW. 2. Lymph node, sentinel, biopsy, Right - THERE IS NO EVIDENCE OF CARCINOMA IN 1 OF 1 LYMPH NODE (0/1). Microscopic Comment 1. BREAST, INVASIVE TUMOR, WITH LYMPH NODE SAMPLING Specimen, including laterality: Right breast Procedure: Needle localized lumpectomy Grade: I Tubule formation: II Nuclear pleomorphism: II Mitotic: I Tumor size (glass slide measurement): 1.6 cm Margins: Greater than 0.3 cm to all margins (gross measurements) Lymphovascular invasion: Not identified Ductal carcinoma in situ: Present, low grade Extensive intraductal carcinoma: Not identified. Lobular neoplasia: Not identified Tumor focality: Unifocal Treatment effect: N/A Extent of tumor: Confined to breast parenchyma Lymph nodes: # examined: 1 Lymph nodes with metastasis: 0 Breast prognostic profile: Case [id number] Estrogen receptor: 100%, strong staining intensity Progesterone receptor: 0% Her 2 neu: No amplification was detected, the ratio was 1.14, Her 2 neu by [facility] will be repeated on the current case and the results reported separately. Ki-67: 27% Non-neoplastic breast: Healing biopsy site TNM: pT1c, pN0 Comments: The malignant cells are positive for e-cadherin. (JBK:caf 01/05/12) Pecola Leisure MD Pathologist, Electronic Signature (Case signed 01/06/2012) Specimen Gross and Clinical Information Specimen(s) Obtained: 1. Breast, lumpectomy, Right 2. Lymph node, sentinel, biopsy, Right Specimen Clinical Information 1. Right breast cancer  RADIOGRAPHIC STUDIES:  No results found.  ASSESSMENT: 72 year old female with  #1 stage I invasive ductal carcinoma measuring 1.6 cm ER positive PR  negative HER-2/neu negative. She is status pos tright breast lumpectomy with sentinel lymph node biopsy. All sentinel nodes were negative for metastatic disease. Patient had Oncotype testing with a recurrence  score of only 9 giving her a 7% distance recurrence with antiestrogen therapy only. Patient would not benefit from chemotherapy.  #2 recommendation is for patient to proceed with radiation therapy first then followed by antiestrogen therapy consisting of an aromatase inhibitor for a total of 5 years. We discussed risks and benefits of of antiestrogen therapy as well as Oncotype testing.  #3 patient has now completed radiation therapy to the right breast. Overall she tolerated it well. She completed this towards the end of January. She has no residual effects from it.  #4 patient will now begin antiestrogen therapy consisting of Arimidex 1 mg daily. Risks and benefits of therapy were discussed with the patient very clearly. She was also given literature. She understands the rationale for this. A prescription was sent to her pharmacy for Arimidex 1 mg daily.  #5 she will also need a bone density scans and I have gone ahead and scheduled this for her.   PLAN:  #1 proceed with antiestrogen therapy Arimidex 1 mg daily.  #2 bone density scan as soon as possible.  #3 patient will return in 3 months time for followup.  All questions were answered. The patient knows to call the clinic with any problems, questions or concerns. We can certainly see the patient much sooner if necessary.  I spent 25 minutes counseling the patient face to face. The total time spent in the appointment was 30 minutes.    Drue Second, MD Medical/Oncology [facility] [phone] (beeper) [phone] (Office)  05/08/2012, 9:35 AM

## 2012-05-08 NOTE — Patient Instructions (Addendum)
Proceed with arimidex 1 mg daily  Bone density scan   I will see you back in approx 3 months  Anastrozole tablets What is this medicine? ANASTROZOLE (an AS troe zole) is used to treat breast cancer in women who have gone through menopause. Some types of breast cancer depend on estrogen to grow, and this medicine can stop tumor growth by blocking estrogen production. This medicine may be used for other purposes; ask your health care provider or pharmacist if you have questions. What should I tell my health care provider before I take this medicine? They need to know if you have any of these conditions: -liver disease -an unusual or allergic reaction to anastrozole, other medicines, foods, dyes, or preservatives -pregnant or trying to get pregnant -breast-feeding How should I use this medicine? Take this medicine by mouth with a glass of water. Follow the directions on the prescription label. You can take this medicine with or without food. Take your doses at regular intervals. Do not take your medicine more often than directed. Do not stop taking except on the advice of your doctor or health care professional. Talk to your pediatrician regarding the use of this medicine in children. Special care may be needed. Overdosage: If you think you have taken too much of this medicine contact a poison control center or emergency room at once. NOTE: This medicine is only for you. Do not share this medicine with others. What if I miss a dose? If you miss a dose, take it as soon as you can. If it is almost time for your next dose, take only that dose. Do not take double or extra doses. What may interact with this medicine? Do not take this medicine with any of the following medications: -female hormones, like estrogens or progestins and birth control pills This medicine may also interact with the following medications: -tamoxifen This list may not describe all possible interactions. Give your health  care provider a list of all the medicines, herbs, non-prescription drugs, or dietary supplements you use. Also tell them if you smoke, drink alcohol, or use illegal drugs. Some items may interact with your medicine. What should I watch for while using this medicine? Visit your doctor or health care professional for regular checks on your progress. Let your doctor or health care professional know about any unusual vaginal bleeding. Do not treat yourself for diarrhea, nausea, vomiting or other side effects. Ask your doctor or health care professional for advice. What side effects may I notice from receiving this medicine? Side effects that you should report to your doctor or health care professional as soon as possible: -allergic reactions like skin rash, itching or hives, swelling of the face, lips, or tongue -any new or unusual symptoms -breathing problems -chest pain -leg pain or swelling -vomiting Side effects that usually do not require medical attention (report to your doctor or health care professional if they continue or are bothersome): -back or bone pain -cough, or throat infection -diarrhea or constipation -dizziness -headache -hot flashes -loss of appetite -nausea -sweating -weakness and tiredness -weight gain This list may not describe all possible side effects. Call your doctor for medical advice about side effects. You may report side effects to FDA at 1-800-FDA-1088. Where should I keep my medicine? Keep out of the reach of children. Store at room temperature between 20 and 25 degrees C (68 and 77 degrees F). Throw away any unused medicine after the expiration date. NOTE: This sheet is a summary. It  may not cover all possible information. If you have questions about this medicine, talk to your doctor, pharmacist, or health care provider.  2013, [employer]vier/Gold Standard. (05/11/2007 4:31:52 PM)

## 2012-05-09 ENCOUNTER — Encounter: Payer: Self-pay | Admitting: Radiation Oncology

## 2012-05-09 DIAGNOSIS — J449 Chronic obstructive pulmonary disease, unspecified: Secondary | ICD-10-CM | POA: Insufficient documentation

## 2012-05-09 DIAGNOSIS — M4800 Spinal stenosis, site unspecified: Secondary | ICD-10-CM | POA: Insufficient documentation

## 2012-05-11 ENCOUNTER — Ambulatory Visit
Admission: RE | Admit: 2012-05-11 | Discharge: 2012-05-11 | Disposition: A | Discharge status: Home / Self Care | Payer: PRIVATE HEALTH INSURANCE | Source: Ambulatory Visit | Attending: Radiation Oncology | Admitting: Radiation Oncology

## 2012-05-11 ENCOUNTER — Encounter: Payer: Self-pay | Admitting: Radiation Oncology

## 2012-05-11 VITALS — BP 139/76 | HR 61 | Temp 97.0°F | Resp 18 | Wt 153.4 lb

## 2012-05-11 DIAGNOSIS — C50211 Malignant neoplasm of upper-inner quadrant of right female breast: Secondary | ICD-10-CM

## 2012-05-11 NOTE — Progress Notes (Signed)
Department of Radiation Oncology  Phone:  [phone] Fax:        [phone]   Name: Alexandria [name] MRN: [id number]  DOB: January 26, 1941  Date: 05/11/2012  Follow Up Visit Note  Diagnosis: T1cN0 invasive breast cancer   Summary and Interval since last radiation: (40 gy to the right breast completed 04/13/12)   Interval History: Alexandria [name] presents today for routine followup.  She is feeling well and doing well. She is pleased with how well her skin is healed. She's been using vitamin E in the area around her lumpectomy cavity. She has taken Arimidex for 3 days and is tolerating that well. She has had some minimal side effects she believes from the pill including ankle edema headaches and a charley horse. Otherwise she really feels like she is doing well. She continues to careful time for her husband.  Allergies:  Allergies  Allergen Reactions  . Aspirin Hives  . Chocolate Hives and Other (See Comments)    RUNNY NOSE   . Coffee Bean Extract Hives and Other (See Comments)    RUNNY NOSE   . Ibuprofen Hives and Other (See Comments)    RUNNY NOSE  . Oxycodone Hcl Hives and Nausea And Vomiting  . Penicillins Hives  . Shrimp (Shellfish Allergy) Hives and Other (See Comments)    RUNNY NOSE   . Sulfonamide Derivatives Hives  . Augmentin (Amoxicillin-Pot Clavulanate) Itching and Rash  . Eggs Or Egg-Derived Products Nausea Only    Medications:  Current Outpatient Prescriptions  Medication Sig Dispense Refill  . albuterol (PROVENTIL HFA;VENTOLIN HFA) 108 (90 BASE) MCG/ACT inhaler Inhale 2 puffs into the lungs every 6 (six) hours as needed. For shortness of breath and wheezing  18 g  3  . amLODipine (NORVASC) 5 MG tablet Take 1 tablet (5 mg total) by mouth daily.  90 tablet  3  . anastrozole (ARIMIDEX) 1 MG tablet Take 1 tablet (1 mg total) by mouth daily.  90 tablet  12  . atenolol (TENORMIN) 25 MG tablet One half tab every morning  90 tablet  3  . atenolol-chlorthalidone (TENORETIC)  50-25 MG per tablet       . atorvastatin (LIPITOR) 20 MG tablet Take 1 tablet (20 mg total) by mouth daily. Twice weekly  100 tablet  3  . b complex vitamins tablet Take 1 tablet by mouth daily. PLUS FOLIC ACID PLUS VIT. C      . Biotin 5000 MCG TABS Take by mouth.      . Cholecalciferol (VITAMIN D-3) 5000 UNITS TABS Take 1,000 Units by mouth daily.       . clopidogrel (PLAVIX) 75 MG tablet Take 1 tablet (75 mg total) by mouth daily.  30 tablet  6  . co-enzyme Q-10 50 MG capsule Take 200 mg by mouth daily.      . cyclobenzaprine (FLEXERIL) 5 MG tablet Take 5 mg by mouth 3 (three) times daily as needed. For muscle spasms      . escitalopram (LEXAPRO) 10 MG tablet as directed. 1/2 - 1 at bedtime      . fluticasone (FLONASE) 50 MCG/ACT nasal spray Place 2 sprays into the nose daily.  16 g  11  . Fluticasone-Salmeterol (ADVAIR DISKUS) 100-50 MCG/DOSE AEPB Inhale 1 puff into the lungs 2 (two) times daily at 10 AM and 5 PM.  60 each  11  . glucosamine-chondroitin 500-400 MG tablet Take 1 tablet by mouth 2 (two) times daily. 1500/1200 MG      .  hydrochlorothiazide (HYDRODIURIL) 12.5 MG tablet 1 by mouth every morning when necessary  90 tablet  3  . loratadine (CLARITIN) 10 MG tablet Take 10 mg by mouth daily.        . LUTEIN PO Take by mouth.      . meloxicam (MOBIC) 15 MG tablet Take 2 tablets (30 mg total) by mouth daily.  100 tablet  3  . pantoprazole (PROTONIX) 40 MG tablet TAKE 1 TABLET EACH DAY.  90 tablet  4  . potassium chloride SA (K-DUR,KLOR-CON) 20 MEQ tablet TAKE 3 TABLETS DAILY.  180 tablet  3  . Probiotic Product (ALIGN PO) Take 1 tablet by mouth daily.       Marland Kitchen selenium 50 MCG TABS Take by mouth daily. 200mg  qd      . emollient (BIAFINE) cream Apply topically 2 (two) times daily.      Marland Kitchen HYDROcodone-acetaminophen (NORCO) 10-325 MG per tablet Take 1 tablet by mouth every 6 (six) hours as needed for pain.  20 tablet  0  . Wound Cleansers (RADIAPLEX EX) Apply topically.       No current  facility-administered medications for this encounter.    Physical Exam:  Filed Vitals:   05/11/12 1239  BP: 139/76  Pulse: 61  Temp: 97 F (36.1 C)  Resp: 18   show some skin darkening in the upper inner quadrant of the right breast around her scar. The rest of her skin is well-healed.  IMPRESSION: Alexandria [name] is a 72 y.o. female status post breast conservation and radiation with resolving acute effects of treatment  PLAN:  She looks great. Her skin is healed up nicely. She is regular scheduled followup with medical oncology. We discussed the importance of sun protection in the treated area. I've encouraged her to contact me if any questions. I be happy to see her back on an as-needed basis.    Lurline Hare, MD

## 2012-05-11 NOTE — Progress Notes (Signed)
Patient presents to the clinic today unaccompanied for follow up appointment with Dr. Michell Heinrich. Patient is alert and oriented to person, place, and time. No distress noted. Steady gait noted. Pleasant affect noted. Patient denies pain at this time. Patient reports the color and texture of right/treated breast. Patient reports that she used biafine for three weeks following treatment but, has switched to otc vitamin e. Patient is pleased with the appearance of the skin of her right breast. Patient denies swelling in right hand, right arm or right breast. Patient reports fatigue is slowly improving. Patient has been taking arimidex for three days now. Patient associates recent headache, ankle edema, and charlie horses to the arimidex. Reported all findings to Dr. Michell Heinrich.

## 2012-05-15 ENCOUNTER — Ambulatory Visit: Payer: Medicare Other | Admitting: Physical Therapy

## 2012-05-15 ENCOUNTER — Other Ambulatory Visit: Payer: Self-pay | Admitting: Family Medicine

## 2012-05-15 ENCOUNTER — Encounter: Payer: Self-pay | Admitting: Cardiology

## 2012-05-15 ENCOUNTER — Ambulatory Visit (INDEPENDENT_AMBULATORY_CARE_PROVIDER_SITE_OTHER): Payer: Medicare Other | Admitting: Cardiology

## 2012-05-15 VITALS — BP 130/80 | HR 64 | Ht 64.0 in | Wt 153.0 lb

## 2012-05-15 DIAGNOSIS — IMO0002 Reserved for concepts with insufficient information to code with codable children: Secondary | ICD-10-CM

## 2012-05-15 DIAGNOSIS — Z87891 Personal history of nicotine dependence: Secondary | ICD-10-CM

## 2012-05-15 DIAGNOSIS — I6350 Cerebral infarction due to unspecified occlusion or stenosis of unspecified cerebral artery: Secondary | ICD-10-CM

## 2012-05-15 DIAGNOSIS — E785 Hyperlipidemia, unspecified: Secondary | ICD-10-CM

## 2012-05-15 DIAGNOSIS — R002 Palpitations: Secondary | ICD-10-CM

## 2012-05-15 DIAGNOSIS — I639 Cerebral infarction, unspecified: Secondary | ICD-10-CM

## 2012-05-15 MED ORDER — METOPROLOL SUCCINATE ER 25 MG PO TB24: 25.0000 mg | ORAL_TABLET | Freq: Every day | ORAL | Status: DC

## 2012-05-15 NOTE — Patient Instructions (Addendum)
Please stop Atenolol and start Metoprolol succinate 25 mg a day Continue all other medications as listed  Your physician has requested that you have an echocardiogram. Echocardiography is a painless test that uses sound waves to create images of your heart. It provides your doctor with information about the size and shape of your heart and how well your heart's chambers and valves are working. This procedure takes approximately one hour. There are no restrictions for this procedure.  Your physician has requested that you have an exercise tolerance test. For further information please visit [email or web address] Please also follow instruction sheet, as given.  Please have fasting lipid profile done at one of these visits.  Follow up will be based on these results.

## 2012-05-15 NOTE — Progress Notes (Signed)
HPI The patient presents for evaluation of an apparent stroke. She recently was seen by neurology. She had progressive gait instability and ataxia. She's had multiple falls. She's had an extensive workup including MRI of the brain. She's been treated with Plavix and has been intolerant of acid.  She has been referred for echocardiography for possible stroke workup.  She complains of some expressive aphasia still and gait disturbance.  The patient did have a history of palpitations for many years ago. She wore a Holter. She was originally treated with Toprol. She was subsequently switched to atenolol. She was on HCTZ combination therapy. However, at one point HCTZ was stopped and atenolol was reduced. She subsequently has had some increased palpitations. She has had a stress test that I saw in 2010 with an EF of 82% on nuclear imaging without ischemia or infarct. She reports chest and arm discomfort. She says that this happens sporadically. She gets right greater than left arm discomfort sometimes with exertion. It might be 7/10 in intensity. It might last for 7-10 minutes. She has some numbness and tingling in her fingers with this. She does get some discomfort that radiates through to her back as well as chest cramping.  She does have some shortness of breath though she's not describing PND or orthopnea.   Allergies  Allergen Reactions  . Aspirin Hives  . Chocolate Hives and Other (See Comments)    RUNNY NOSE   . Coffee Bean Extract Hives and Other (See Comments)    RUNNY NOSE   . Ibuprofen Hives and Other (See Comments)    RUNNY NOSE  . Oxycodone Hcl Hives and Nausea And Vomiting  . Penicillins Hives  . Shrimp (Shellfish Allergy) Hives and Other (See Comments)    RUNNY NOSE   . Sulfonamide Derivatives Hives  . Augmentin (Amoxicillin-Pot Clavulanate) Itching and Rash  . Eggs Or Egg-Derived Products Nausea Only    Current Outpatient Prescriptions  Medication Sig Dispense Refill  .  albuterol (PROVENTIL HFA;VENTOLIN HFA) 108 (90 BASE) MCG/ACT inhaler Inhale 2 puffs into the lungs every 6 (six) hours as needed. For shortness of breath and wheezing  18 g  3  . amLODipine (NORVASC) 5 MG tablet Take 1 tablet (5 mg total) by mouth daily.  90 tablet  3  . anastrozole (ARIMIDEX) 1 MG tablet Take 1 tablet (1 mg total) by mouth daily.  90 tablet  12  . atenolol (TENORMIN) 25 MG tablet One half tab every morning  90 tablet  3  . atorvastatin (LIPITOR) 20 MG tablet Twice weekly      . b complex vitamins tablet Take 1 tablet by mouth daily. PLUS FOLIC ACID PLUS VIT. C      . Biotin 5000 MCG TABS Take 1 tablet by mouth daily.       . Cholecalciferol (VITAMIN D-3) 5000 UNITS TABS Take 1,000 Units by mouth daily.       . clopidogrel (PLAVIX) 75 MG tablet TAKE 1 TABLET DAILY.  90 tablet  3  . co-enzyme Q-10 50 MG capsule Take 200 mg by mouth daily.      . cyclobenzaprine (FLEXERIL) 5 MG tablet Take 5 mg by mouth 3 (three) times daily as needed. For muscle spasms      . escitalopram (LEXAPRO) 10 MG tablet Take 10 mg by mouth daily.       . fluticasone (FLONASE) 50 MCG/ACT nasal spray Place 2 sprays into the nose daily.  16 g  11  .  Fluticasone-Salmeterol (ADVAIR DISKUS) 100-50 MCG/DOSE AEPB Inhale 1 puff into the lungs 2 (two) times daily at 10 AM and 5 PM.  60 each  11  . glucosamine-chondroitin 500-400 MG tablet Take 1 tablet by mouth 2 (two) times daily. 1500/1200 MG      . hydrochlorothiazide (HYDRODIURIL) 12.5 MG tablet Take 12.5 mg by mouth daily.      Marland Kitchen HYDROcodone-acetaminophen (NORCO) 10-325 MG per tablet Take 1 tablet by mouth every 6 (six) hours as needed for pain.  20 tablet  0  . loratadine (CLARITIN) 10 MG tablet Take 10 mg by mouth daily.        . LUTEIN PO Take 1 tablet by mouth daily.       . meloxicam (MOBIC) 15 MG tablet Take 2 tablets (30 mg total) by mouth daily.  100 tablet  3  . pantoprazole (PROTONIX) 40 MG tablet TAKE 1 TABLET EACH DAY.  90 tablet  4  . potassium  chloride SA (K-DUR,KLOR-CON) 20 MEQ tablet 2 tabs po qd      . Probiotic Product (ALIGN PO) Take 1 tablet by mouth daily.       Marland Kitchen selenium 50 MCG TABS Take by mouth daily. 200mg  qd       No current facility-administered medications for this visit.    Past Medical History  Diagnosis Date  . Allergy   . Hearing loss     bilateral hearing aids  . Hyperlipidemia   . GERD (gastroesophageal reflux disease)   . COPD (chronic obstructive pulmonary disease)   . 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 problems with balance  . IBS (irritable bowel syndrome)   . History of kidney stones   . 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 Traumatic hemopneumothorax 1982    left  . PVD (peripheral vascular disease)     varicose veins - left worse than right  . Anxiety   . Depression   . Hypertension     under control, has been on med. since age 47  . Spinal stenosis   . Arthritis     left knee, hands, back  . Palpitations   . Abrasion of finger of left hand 12/29/2011    left middle   . Dental crowns present   . Breast cancer 12/2011    right, ER+, PR -, Her 2 -  . Asthma     daily and prn inhalers  . Long term current use of aromatase inhibitor 05/08/2012  . Hx of radiation therapy 03/12/12 -04/13/12    right breast    Past Surgical History  Procedure Laterality Date  . Vein surgery      vein ablation left leg  . Knee surgery  1980s    left  . Orif ankle fracture  1980s    right   . Ankle hardware removal      right  . Tubal ligation  1976  . Cholecystectomy  1990s  . Transobturator sling  07/05/2005  . Cystoscopy  07/05/2005  . Breast biopsy  1976    right  . Breast lumpectomy  2013    right with snbx    Family History  Problem Relation Age of Onset  . Cancer Maternal Aunt     breast  . Cancer Paternal Aunt     Multiple Myeloma  . Stomach cancer Paternal Uncle   . Breast cancer Paternal Uncle   .  Breast  cancer Cousin     early 84s  . Breast cancer Paternal Aunt     late 30s-60s  . Heart attack Father   . Heart attack Paternal Grandfather   . Breast cancer Paternal Aunt     bilateral breast cancer  . Dementia Paternal Aunt   . Congestive Heart Failure Paternal Uncle   . Congestive Heart Failure Paternal Uncle     History   Social History  . Marital Status: Married    Spouse Name: N/A    Number of Children: N/A  . Years of Education: N/A   Occupational History  . Not on file.   Social History Main Topics  . Smoking status: Former Smoker -- 0.50 packs/day for 15 years    Types: Cigarettes    Quit date: 08/28/1993  . Smokeless tobacco: Never Used  . Alcohol Use: Yes     Comment: occasionally  . Drug Use: No  . Sexually Active: Not Currently   Other Topics Concern  . Not on file   Social History Narrative  . No narrative on file    ROS:  Positive for headaches, dizziness, vertigo, tinnitus, call, wheezing, asthma, diarrhea, constipation, reflux, joint cramps, pain, swelling, edema, rhinitis. Otherwise negative for all other systems.  PHYSICAL EXAM BP 130/80  Pulse 64  Ht 5\' 4"  (1.626 m)  Wt 153 lb (69.4 kg)  BMI 26.25 kg/m2 GENERAL:  Well appearing HEENT:  Pupils equal round and reactive, fundi not visualized, oral mucosa unremarkable NECK:  No jugular venous distention, waveform within normal limits, carotid upstroke brisk and symmetric, no bruits, no thyromegaly LYMPHATICS:  No cervical, inguinal adenopathy LUNGS:  Clear to auscultation bilaterally BACK:  No CVA tenderness CHEST:  Unremarkable HEART:  PMI not displaced or sustained,S1 and S2 within normal limits, no S3, no S4, no clicks, no rubs, no murmurs ABD:  Flat, positive bowel sounds normal in frequency in pitch, no bruits, no rebound, no guarding, no midline pulsatile mass, no hepatomegaly, no splenomegaly EXT:  2 plus pulses throughout, no edema, no cyanosis no clubbing SKIN:  No rashes no  nodules NEURO:  Cranial nerves II through XII grossly intact, motor grossly intact throughout PSYCH:  Cognitively intact, oriented to person place and time  EKG:   Sinus rhythm, rate 60, axis within normal limits, intervals within normal limits, no acute ST-T wave changes. 05/15/2012  ASSESSMENT AND PLAN  CHEST PAIN:  The patient has her dominantly atypical chest pain but a significant family history of coronary disease. I think the pretest probability of obstructive coronary disease is somewhat low. I will bring the patient back for a POET (Plain Old Exercise Test). This will allow me to screen for obstructive coronary disease, risk stratify and very importantly provide a prescription for exercise.  PALPITATIONS:  She did better with Toprol so I will discontinue atenolol and start her back on Toprol. She will let me know how she does with palpitations.  CVA:  She is requested to have an echocardiogram by her neurologist and I will arrange this.  HYPERLIPIDEMIA:  I noted that a direct LDL in 2010 was 183.  I will arrange a repeat of this.

## 2012-05-17 ENCOUNTER — Ambulatory Visit: Payer: Medicare Other | Attending: Oncology | Admitting: Physical Therapy

## 2012-05-17 DIAGNOSIS — IMO0001 Reserved for inherently not codable concepts without codable children: Secondary | ICD-10-CM | POA: Insufficient documentation

## 2012-05-17 DIAGNOSIS — M25619 Stiffness of unspecified shoulder, not elsewhere classified: Secondary | ICD-10-CM | POA: Insufficient documentation

## 2012-05-17 DIAGNOSIS — C50919 Malignant neoplasm of unspecified site of unspecified female breast: Secondary | ICD-10-CM | POA: Insufficient documentation

## 2012-05-17 DIAGNOSIS — M25519 Pain in unspecified shoulder: Secondary | ICD-10-CM | POA: Insufficient documentation

## 2012-05-17 DIAGNOSIS — R293 Abnormal posture: Secondary | ICD-10-CM | POA: Insufficient documentation

## 2012-05-18 ENCOUNTER — Other Ambulatory Visit: Payer: Self-pay

## 2012-05-27 ENCOUNTER — Telehealth: Payer: Self-pay | Admitting: Oncology

## 2012-05-27 NOTE — Telephone Encounter (Signed)
lmonvm for pt confimiing 3/21 bone density test and 5/21 lb/KK.

## 2012-06-01 ENCOUNTER — Other Ambulatory Visit: Payer: Self-pay

## 2012-06-04 ENCOUNTER — Ambulatory Visit (INDEPENDENT_AMBULATORY_CARE_PROVIDER_SITE_OTHER): Payer: Medicare Other | Admitting: Nurse Practitioner

## 2012-06-04 ENCOUNTER — Encounter: Payer: Self-pay | Admitting: Nurse Practitioner

## 2012-06-04 ENCOUNTER — Ambulatory Visit (INDEPENDENT_AMBULATORY_CARE_PROVIDER_SITE_OTHER): Payer: Medicare Other | Admitting: *Deleted

## 2012-06-04 VITALS — BP 117/67 | HR 61

## 2012-06-04 DIAGNOSIS — R079 Chest pain, unspecified: Secondary | ICD-10-CM

## 2012-06-04 DIAGNOSIS — R002 Palpitations: Secondary | ICD-10-CM

## 2012-06-04 DIAGNOSIS — E785 Hyperlipidemia, unspecified: Secondary | ICD-10-CM

## 2012-06-04 DIAGNOSIS — IMO0002 Reserved for concepts with insufficient information to code with codable children: Secondary | ICD-10-CM

## 2012-06-04 LAB — LIPID PANEL
LDL Cholesterol: 105 mg/dL — ABNORMAL HIGH (ref 0–99)
Total CHOL/HDL Ratio: 3
Triglycerides: 118 mg/dL (ref 0.0–149.0)

## 2012-06-04 LAB — HEPATIC FUNCTION PANEL
Albumin: 4.2 g/dL (ref 3.5–5.2)
Alkaline Phosphatase: 58 U/L (ref 39–117)
Total Bilirubin: 1.7 mg/dL — ABNORMAL HIGH (ref 0.3–1.2)

## 2012-06-04 NOTE — Procedures (Signed)
Exercise Treadmill Test  Pre-Exercise Testing Evaluation Rhythm: normal sinus  Rate: 72     Test  Exercise Tolerance Test Ordering MD: Angelina Sheriff, MD  Interpreting MD: Norma Fredrickson NP  Unique Test No:1 Treadmill:  1  Indication for ETT: htn  Contraindication to ETT: No   Stress Modality: exercise - treadmill  Cardiac Imaging Performed: non   Protocol: standard Bruce - maximal  Max BP:  125/72  Max MPHR (bpm):  151 85% MPR (bpm):  128  MPHR obtained (bpm):  93 % MPHR obtained:  61%  Reached 85% MPHR (min:sec):  N/A Total Exercise Time (min-sec):  3:14  Workload in METS:  4.8 Borg Scale: 18  Reason ETT Terminated:  patient's desire to stop    ST Segment Analysis At Rest: normal ST segments - no evidence of significant ST depression With Exercise: no evidence of significant ST depression  Other Information Arrhythmia:  No Angina during ETT:  present (1) Quality of ETT:  non-diagnostic  ETT Interpretation:  Indeterminate  Comments: Patient presents today for routine GXT. Has had chest heaviness - usually exertional in nature. Has positive family history of early CAD. Has had prior stroke, still with gait disturbance, aphasia and falls.   Today, she exercised on the standard Bruce protocol for a total of 3:14. She has very poor exercise tolerance. Target heart rate not achieved. Not able to continue to walk due to her left sided weakness. She did endorse some mild chest heaviness that resolved with stopping. EKG was negative but overall the test is nondiagnostic.   Recommendations: Lexiscan ordered.   Patient is agreeable to this plan and will call if any problems develop in the interim.   Rosalio Macadamia, RN, ANP-C [facility] [address] [place], [place]  [address]

## 2012-06-05 ENCOUNTER — Ambulatory Visit: Payer: Medicare Other | Admitting: Physical Therapy

## 2012-06-06 ENCOUNTER — Ambulatory Visit (HOSPITAL_COMMUNITY): Payer: Medicare Other | Attending: Cardiology

## 2012-06-06 DIAGNOSIS — J4489 Other specified chronic obstructive pulmonary disease: Secondary | ICD-10-CM | POA: Insufficient documentation

## 2012-06-06 DIAGNOSIS — R002 Palpitations: Secondary | ICD-10-CM

## 2012-06-06 DIAGNOSIS — J449 Chronic obstructive pulmonary disease, unspecified: Secondary | ICD-10-CM | POA: Insufficient documentation

## 2012-06-06 DIAGNOSIS — Z8673 Personal history of transient ischemic attack (TIA), and cerebral infarction without residual deficits: Secondary | ICD-10-CM | POA: Insufficient documentation

## 2012-06-06 DIAGNOSIS — I639 Cerebral infarction, unspecified: Secondary | ICD-10-CM

## 2012-06-06 DIAGNOSIS — I6789 Other cerebrovascular disease: Secondary | ICD-10-CM

## 2012-06-06 DIAGNOSIS — E785 Hyperlipidemia, unspecified: Secondary | ICD-10-CM | POA: Insufficient documentation

## 2012-06-06 DIAGNOSIS — I10 Essential (primary) hypertension: Secondary | ICD-10-CM | POA: Insufficient documentation

## 2012-06-06 DIAGNOSIS — Z87891 Personal history of nicotine dependence: Secondary | ICD-10-CM | POA: Insufficient documentation

## 2012-06-06 NOTE — Progress Notes (Signed)
Echocardiogram performed.  

## 2012-06-07 ENCOUNTER — Ambulatory Visit: Payer: Self-pay | Admitting: Physical Therapy

## 2012-06-07 ENCOUNTER — Encounter (HOSPITAL_COMMUNITY): Payer: Self-pay

## 2012-06-07 ENCOUNTER — Encounter (INDEPENDENT_AMBULATORY_CARE_PROVIDER_SITE_OTHER): Payer: Self-pay | Admitting: General Surgery

## 2012-06-12 ENCOUNTER — Ambulatory Visit: Payer: Medicare Other | Attending: Oncology | Admitting: Physical Therapy

## 2012-06-12 DIAGNOSIS — R293 Abnormal posture: Secondary | ICD-10-CM | POA: Insufficient documentation

## 2012-06-12 DIAGNOSIS — M25519 Pain in unspecified shoulder: Secondary | ICD-10-CM | POA: Insufficient documentation

## 2012-06-12 DIAGNOSIS — M25619 Stiffness of unspecified shoulder, not elsewhere classified: Secondary | ICD-10-CM | POA: Insufficient documentation

## 2012-06-12 DIAGNOSIS — IMO0001 Reserved for inherently not codable concepts without codable children: Secondary | ICD-10-CM | POA: Insufficient documentation

## 2012-06-12 DIAGNOSIS — C50919 Malignant neoplasm of unspecified site of unspecified female breast: Secondary | ICD-10-CM | POA: Insufficient documentation

## 2012-06-13 ENCOUNTER — Telehealth: Payer: Self-pay | Admitting: Cardiology

## 2012-06-13 ENCOUNTER — Ambulatory Visit (HOSPITAL_COMMUNITY): Payer: Medicare Other | Attending: Cardiovascular Disease | Admitting: Radiology

## 2012-06-13 VITALS — BP 132/72 | HR 53 | Ht 64.0 in | Wt 148.0 lb

## 2012-06-13 DIAGNOSIS — R079 Chest pain, unspecified: Secondary | ICD-10-CM

## 2012-06-13 DIAGNOSIS — R0602 Shortness of breath: Secondary | ICD-10-CM

## 2012-06-13 DIAGNOSIS — IMO0002 Reserved for concepts with insufficient information to code with codable children: Secondary | ICD-10-CM

## 2012-06-13 DIAGNOSIS — I10 Essential (primary) hypertension: Secondary | ICD-10-CM | POA: Insufficient documentation

## 2012-06-13 DIAGNOSIS — R55 Syncope and collapse: Secondary | ICD-10-CM | POA: Insufficient documentation

## 2012-06-13 DIAGNOSIS — R0789 Other chest pain: Secondary | ICD-10-CM | POA: Insufficient documentation

## 2012-06-13 DIAGNOSIS — R5381 Other malaise: Secondary | ICD-10-CM | POA: Insufficient documentation

## 2012-06-13 DIAGNOSIS — R0609 Other forms of dyspnea: Secondary | ICD-10-CM | POA: Insufficient documentation

## 2012-06-13 DIAGNOSIS — R002 Palpitations: Secondary | ICD-10-CM | POA: Insufficient documentation

## 2012-06-13 DIAGNOSIS — R0989 Other specified symptoms and signs involving the circulatory and respiratory systems: Secondary | ICD-10-CM | POA: Insufficient documentation

## 2012-06-13 DIAGNOSIS — R110 Nausea: Secondary | ICD-10-CM | POA: Insufficient documentation

## 2012-06-13 DIAGNOSIS — R42 Dizziness and giddiness: Secondary | ICD-10-CM | POA: Insufficient documentation

## 2012-06-13 MED ORDER — REGADENOSON 0.4 MG/5ML IV SOLN
0.4000 mg | Freq: Once | INTRAVENOUS | Status: AC
  Administered 2012-06-13: 0.4 mg via INTRAVENOUS

## 2012-06-13 MED ORDER — TECHNETIUM TC 99M SESTAMIBI GENERIC - CARDIOLITE
10.0000 | Freq: Once | INTRAVENOUS | Status: AC | PRN
  Administered 2012-06-13: 10 via INTRAVENOUS

## 2012-06-13 MED ORDER — TECHNETIUM TC 99M SESTAMIBI GENERIC - CARDIOLITE
30.0000 | Freq: Once | INTRAVENOUS | Status: AC | PRN
  Administered 2012-06-13: 30 via INTRAVENOUS

## 2012-06-13 NOTE — Telephone Encounter (Signed)
rviewed results of echo and lipid/liver with pt who states understanding.

## 2012-06-13 NOTE — Telephone Encounter (Signed)
New problem:    Echo test results.   

## 2012-06-13 NOTE — Progress Notes (Signed)
  [facility] SITE 3 NUCLEAR MED [address] [place], [place] [address] [phone]    Cardiology Nuclear Med Study  Alexandria [name] is a 72 y.o. female     MRN : [id number]     DOB: [birth year]  Procedure Date: 06/13/2012  Nuclear Med Background Indication for Stress Test:  Evaluation for Ischemia History:  '10 OZH:YQMVHQ, EF=82%; 06/04/12 ION:[id number], THR not achieved; 06/06/12 Echo:EF=60% Cardiac Risk Factors: CVA, Family History - CAD, History of Smoking, Hypertension and Lipids  Symptoms:  Chest Pressure with and without Exertion (last episode of chest discomfort was at the end of December 2013), Diaphoresis, Dizziness, DOE, Fatigue, Nausea, Near Syncope, Palpitations and Rapid HR   Nuclear Pre-Procedure Caffeine/Decaff Intake:  7:00pm NPO After: 7:00pm   Lungs:  clear O2 Sat: 99% on room air. IV 0.9% NS with Angio Cath:  22g  IV Site: L Hand  IV Started by:  Cathlyn Parsons, RN  Chest Size (in):  34 Cup Size: B  Height: 5\' 4"  (1.626 m)  Weight:  148 lb (67.132 kg)  BMI:  Body mass index is 25.39 kg/(m^2). Tech Comments:  Toprol held x 24 hours    Nuclear Med Study 1 or 2 day study: 1 day  Stress Test Type:  Lexiscan  Reading MD: Charlton Haws, MD  Order Authorizing Provider:  Rollene Rotunda, MD and Norma Fredrickson, NP  Resting Radionuclide: Technetium 38m Sestamibi  Resting Radionuclide Dose: 11.0 mCi   Stress Radionuclide:  Technetium 61m Sestamibi  Stress Radionuclide Dose: 33.0 mCi           Stress Protocol Rest HR: 53 Stress HR: 79  Rest BP: 132/72 Stress BP: 142/62  Exercise Time (min): 2:00 METS: n/a   Predicted Max HR: 151 bpm % Max HR: 52.32 bpm Rate Pressure Product: 10272   Dose of Adenosine (mg):  n/a Dose of Lexiscan: 0.4 mg  Dose of Atropine (mg): n/a Dose of Dobutamine: n/a mcg/kg/min (at max HR)  Stress Test Technologist: Smiley Houseman, CMA-N  Nuclear Technologist:  Doyne Keel, CNMT     Rest Procedure:  Myocardial perfusion  imaging was performed at rest 45 minutes following the intravenous administration of Technetium 29m Sestamibi.  Rest ECG: NSR - Normal EKG  Stress Procedure:  The patient received IV Lexiscan 0.4 mg over 15-seconds.  She c/o brief chest pressure with Lexiscan.  Technetium 4m Sestamibi injected at 30-seconds.  Quantitative spect images were obtained after a 45 minute delay.  Stress ECG: No significant change from baseline ECG  QPS Raw Data Images:  Normal; no motion artifact; normal heart/lung ratio. Stress Images:  Normal homogeneous uptake in all areas of the myocardium. Rest Images:  Normal homogeneous uptake in all areas of the myocardium. Subtraction (SDS):  Normal Transient Ischemic Dilatation (Normal <1.22):  1.07 Lung/Heart Ratio (Normal <0.45):  0.27  Quantitative Gated Spect Images QGS EDV:  80 ml QGS ESV:  17 ml  Impression Exercise Capacity:  Lexiscan with low level exercise. BP Response:  Normal blood pressure response. Clinical Symptoms:  No significant symptoms noted. ECG Impression:  No significant ST segment change suggestive of ischemia. Comparison with Prior Nuclear Study: No images to compare  Overall Impression:  Normal stress nuclear study.  LV Ejection Fraction: 78%.  LV Wall Motion:  NL LV Function; NL Wall Motion  Charlton Haws

## 2012-06-14 ENCOUNTER — Ambulatory Visit: Payer: Medicare Other | Admitting: Physical Therapy

## 2012-06-15 ENCOUNTER — Ambulatory Visit
Admission: RE | Admit: 2012-06-15 | Discharge: 2012-06-15 | Disposition: A | Discharge status: Home / Self Care | Payer: Medicare Other | Source: Ambulatory Visit | Attending: Oncology | Admitting: Oncology

## 2012-06-15 DIAGNOSIS — C50211 Malignant neoplasm of upper-inner quadrant of right female breast: Secondary | ICD-10-CM

## 2012-06-19 ENCOUNTER — Ambulatory Visit: Payer: Medicare Other | Admitting: Physical Therapy

## 2012-06-21 ENCOUNTER — Ambulatory Visit: Payer: Medicare Other | Admitting: Physical Therapy

## 2012-06-26 ENCOUNTER — Ambulatory Visit: Payer: Medicare Other | Admitting: Physical Therapy

## 2012-06-27 ENCOUNTER — Ambulatory Visit: Payer: Medicare Other | Admitting: Physical Therapy

## 2012-06-28 ENCOUNTER — Ambulatory Visit: Payer: Self-pay | Admitting: Physical Therapy

## 2012-07-03 ENCOUNTER — Encounter: Payer: Self-pay | Admitting: Oncology

## 2012-07-11 ENCOUNTER — Ambulatory Visit: Payer: Medicare Other | Admitting: Physical Therapy

## 2012-07-13 ENCOUNTER — Ambulatory Visit: Payer: Medicare Other | Admitting: Physical Therapy

## 2012-07-16 ENCOUNTER — Ambulatory Visit (INDEPENDENT_AMBULATORY_CARE_PROVIDER_SITE_OTHER): Payer: Medicare Other | Admitting: General Surgery

## 2012-07-17 ENCOUNTER — Ambulatory Visit: Payer: Medicare Other | Attending: Oncology | Admitting: Physical Therapy

## 2012-07-17 DIAGNOSIS — M25519 Pain in unspecified shoulder: Secondary | ICD-10-CM | POA: Insufficient documentation

## 2012-07-17 DIAGNOSIS — C50919 Malignant neoplasm of unspecified site of unspecified female breast: Secondary | ICD-10-CM | POA: Insufficient documentation

## 2012-07-17 DIAGNOSIS — R293 Abnormal posture: Secondary | ICD-10-CM | POA: Insufficient documentation

## 2012-07-17 DIAGNOSIS — M25619 Stiffness of unspecified shoulder, not elsewhere classified: Secondary | ICD-10-CM | POA: Insufficient documentation

## 2012-07-17 DIAGNOSIS — IMO0001 Reserved for inherently not codable concepts without codable children: Secondary | ICD-10-CM | POA: Insufficient documentation

## 2012-07-23 ENCOUNTER — Encounter (INDEPENDENT_AMBULATORY_CARE_PROVIDER_SITE_OTHER): Payer: Self-pay | Admitting: General Surgery

## 2012-07-23 ENCOUNTER — Ambulatory Visit (INDEPENDENT_AMBULATORY_CARE_PROVIDER_SITE_OTHER): Payer: Medicare Other | Admitting: General Surgery

## 2012-07-23 VITALS — BP 140/84 | HR 63 | Temp 97.4°F | Ht 64.0 in | Wt 150.6 lb

## 2012-07-23 DIAGNOSIS — C50211 Malignant neoplasm of upper-inner quadrant of right female breast: Secondary | ICD-10-CM

## 2012-07-23 DIAGNOSIS — C50219 Malignant neoplasm of upper-inner quadrant of unspecified female breast: Secondary | ICD-10-CM

## 2012-07-23 NOTE — Progress Notes (Signed)
Subjective:     Patient ID: [name], female   DOB: 02/12/1941, 72 y.o.   MRN: [id number]  HPI This is a 72 year old female who underwent a right breast lumpectomy and sentinel lymph node biopsy in 2013 for a stage I breast cancer that is estrogen receptor positive, progesterone receptor negative, HER-2/neu is not amplified. She then underwent radiation therapy which he tolerated fairly well. She is now on antiestrogen therapy for which he has some gastrointestinal issues and some occasional hot flashes 3/7 days but is otherwise doing fairly well. She notes no real complaints about either breast. She has a pain at her surgical site occasionally. She does have shoulder pain and pain at the inner aspect of her right arm with some occasional swelling in that arm. She underwent physical therapy postoperatively and this made this a lot better.  Review of Systems     Objective:   Physical Exam  Vitals reviewed. Constitutional: She appears well-developed and well-nourished.  Pulmonary/Chest: Right breast exhibits no inverted nipple, no mass, no nipple discharge, no skin change and no tenderness. Left breast exhibits no inverted nipple, no mass, no nipple discharge, no skin change and no tenderness.    Lymphadenopathy:    She has no cervical adenopathy.    She has no axillary adenopathy.       Right: No supraclavicular adenopathy present.       Left: No supraclavicular adenopathy present.       Assessment:     Stage I right breast cancer     Plan:     She has no clinical evidence of recurrence. We discussed continuing her old self exams, obtained her yearly mammogram in October. I will also refer her to see physical therapy again as I think that this would be helpful moving forward for right arm. Also told her to consider doing yoga for that side as I think that that would be helpful also. I will plan on seeing her back in one year or sooner if needed.

## 2012-07-23 NOTE — Patient Instructions (Signed)

## 2012-07-27 ENCOUNTER — Ambulatory Visit: Payer: Medicare Other | Admitting: Physical Therapy

## 2012-08-01 ENCOUNTER — Telehealth: Payer: Self-pay | Admitting: Oncology

## 2012-08-01 ENCOUNTER — Other Ambulatory Visit (HOSPITAL_BASED_OUTPATIENT_CLINIC_OR_DEPARTMENT_OTHER): Payer: Medicare Other | Admitting: Lab

## 2012-08-01 ENCOUNTER — Ambulatory Visit (HOSPITAL_BASED_OUTPATIENT_CLINIC_OR_DEPARTMENT_OTHER): Payer: Medicare Other | Admitting: Oncology

## 2012-08-01 ENCOUNTER — Encounter: Payer: Self-pay | Admitting: Oncology

## 2012-08-01 VITALS — BP 125/75 | HR 54 | Temp 98.3°F | Resp 20 | Ht 64.0 in | Wt 149.4 lb

## 2012-08-01 DIAGNOSIS — C50219 Malignant neoplasm of upper-inner quadrant of unspecified female breast: Secondary | ICD-10-CM

## 2012-08-01 DIAGNOSIS — C50211 Malignant neoplasm of upper-inner quadrant of right female breast: Secondary | ICD-10-CM

## 2012-08-01 DIAGNOSIS — Z171 Estrogen receptor negative status [ER-]: Secondary | ICD-10-CM

## 2012-08-01 DIAGNOSIS — M949 Disorder of cartilage, unspecified: Secondary | ICD-10-CM

## 2012-08-01 DIAGNOSIS — M899 Disorder of bone, unspecified: Secondary | ICD-10-CM

## 2012-08-01 LAB — CBC WITH DIFFERENTIAL/PLATELET
BASO%: 1.7 % (ref 0.0–2.0)
Basophils Absolute: 0.1 10*3/uL (ref 0.0–0.1)
EOS%: 1.5 % (ref 0.0–7.0)
HGB: 12.9 g/dL (ref 11.6–15.9)
MCH: 30.2 pg (ref 25.1–34.0)
MCHC: 34.1 g/dL (ref 31.5–36.0)
MCV: 88.5 fL (ref 79.5–101.0)
MONO%: 8.1 % (ref 0.0–14.0)
NEUT%: 58.9 % (ref 38.4–76.8)
RDW: 13.2 % (ref 11.2–14.5)

## 2012-08-01 LAB — COMPREHENSIVE METABOLIC PANEL (CC13)
AST: 24 U/L (ref 5–34)
Alkaline Phosphatase: 65 U/L (ref 40–150)
BUN: 18.6 mg/dL (ref 7.0–26.0)
Creatinine: 1 mg/dL (ref 0.6–1.1)
Potassium: 4.3 mEq/L (ref 3.5–5.1)

## 2012-08-01 MED ORDER — TRAMADOL HCL 50 MG PO TABS: 50.0000 mg | ORAL_TABLET | Freq: Every day | ORAL | Status: DC

## 2012-08-01 MED ORDER — ALENDRONATE SODIUM 70 MG PO TABS: 70.0000 mg | ORAL_TABLET | ORAL | Status: DC

## 2012-08-01 NOTE — Telephone Encounter (Signed)
, °

## 2012-08-01 NOTE — Patient Instructions (Addendum)
Continue arimidex 1 mg daily  Begin fosamax 70 mg q weekly  I will see you back in 6 months  Alendronate tablets What is this medicine? ALENDRONATE (a LEN droe nate) slows calcium loss from bones. It helps to make normal healthy bone and to slow bone loss in people with Paget's disease and osteoporosis. It may be used in others at risk for bone loss. This medicine may be used for other purposes; ask your health care provider or pharmacist if you have questions. What should I tell my health care provider before I take this medicine? They need to know if you have any of these conditions: -dental disease -esophagus, stomach, or intestine problems, like acid reflux or GERD -kidney disease -low blood calcium -low vitamin D -problems sitting or standing 30 minutes -trouble swallowing -an unusual or allergic reaction to alendronate, other medicines, foods, dyes, or preservatives -pregnant or trying to get pregnant -breast-feeding How should I use this medicine? You must take this medicine exactly as directed or you will lower the amount of the medicine you absorb into your body or you may cause yourself harm. Take this medicine by mouth first thing in the morning, after you are up for the day. Do not eat or drink anything before you take your medicine. Swallow the tablet with a full glass (6 to 8 fluid ounces) of plain water. Do not take this medicine with any other drink. Do not chew or crush the tablet. After taking this medicine, do not eat breakfast, drink, or take any medicines or vitamins for at least 30 minutes. Sit or stand up for at least 30 minutes after you take this medicine; do not lie down. Do not take your medicine more often than directed. Talk to your pediatrician regarding the use of this medicine in children. Special care may be needed. Overdosage: If you think you have taken too much of this medicine contact a poison control center or emergency room at once. NOTE: This medicine  is only for you. Do not share this medicine with others. What if I miss a dose? If you miss a dose, do not take it later in the day. Continue your normal schedule starting the next morning. Do not take double or extra doses. What may interact with this medicine? -aluminum hydroxide -antacids -aspirin -calcium supplements -drugs for inflammation like ibuprofen, naproxen, and others -iron supplements -magnesium supplements -vitamins with minerals This list may not describe all possible interactions. Give your health care provider a list of all the medicines, herbs, non-prescription drugs, or dietary supplements you use. Also tell them if you smoke, drink alcohol, or use illegal drugs. Some items may interact with your medicine. What should I watch for while using this medicine? Visit your doctor or health care professional for regular checks ups. It may be some time before you see benefit from this medicine. Do not stop taking your medicine except on your doctor's advice. Your doctor or health care professional may order blood tests and other tests to see how you are doing. You should make sure you get enough calcium and vitamin D while you are taking this medicine, unless your doctor tells you not to. Discuss the foods you eat and the vitamins you take with your health care professional. Some people who take this medicine have severe bone, joint, and/or muscle pain. This medicine may also increase your risk for a broken thigh bone. Tell your doctor right away if you have pain in your upper leg or  groin. Tell your doctor if you have any pain that does not go away or that gets worse. This medicine can make you more sensitive to the sun. If you get a rash while taking this medicine, sunlight may cause the rash to get worse. Keep out of the sun. If you cannot avoid being in the sun, wear protective clothing and use sunscreen. Do not use sun lamps or tanning beds/booths. What side effects may I notice  from receiving this medicine? Side effects that you should report to your doctor or health care professional as soon as possible: -allergic reactions like skin rash, itching or hives, swelling of the face, lips, or tongue -black or tarry stools -bone, muscle or joint pain -changes in vision -chest pain -heartburn or stomach pain -jaw pain, especially after dental work -pain or trouble when swallowing -redness, blistering, peeling or loosening of the skin, including inside the mouth Side effects that usually do not require medical attention (report to your doctor or health care professional if they continue or are bothersome): -changes in taste -diarrhea or constipation -eye pain or itching -headache -nausea or vomiting -stomach gas or fullness This list may not describe all possible side effects. Call your doctor for medical advice about side effects. You may report side effects to FDA at 1-800-FDA-1088. Where should I keep my medicine? Keep out of the reach of children. Store at room temperature of 15 and 30 degrees C (59 and 86 degrees F). Throw away any unused medicine after the expiration date. NOTE: This sheet is a summary. It may not cover all possible information. If you have questions about this medicine, talk to your doctor, pharmacist, or health care provider.  2013, [employer]. (08/27/2010 8:56:09 AM)

## 2012-08-03 ENCOUNTER — Ambulatory Visit: Payer: Medicare Other

## 2012-08-03 ENCOUNTER — Encounter: Payer: Self-pay | Admitting: Neurology

## 2012-08-03 ENCOUNTER — Ambulatory Visit (INDEPENDENT_AMBULATORY_CARE_PROVIDER_SITE_OTHER): Payer: Medicare Other | Admitting: Neurology

## 2012-08-03 VITALS — BP 124/72 | HR 61 | Temp 97.0°F | Ht 64.0 in | Wt 148.0 lb

## 2012-08-03 DIAGNOSIS — R269 Unspecified abnormalities of gait and mobility: Secondary | ICD-10-CM

## 2012-08-03 NOTE — Patient Instructions (Signed)
I think overall you are doing fairly well and are stable at this point.   I do have some generic suggestions for you today:   Please make sure that you drink plenty of fluids. I would like for you to exercise daily for example in the form of walking 20-30 minutes every day, if you can. Please keep a regular sleep-wake schedule, keep regular meal times, do not skip any meals, eat  healthy snacks in between meals, such as fruit or nuts. Try to eat protein with every meal.   As far as your medications are concerned, I would like to suggest: No changes.  I do not think we need to make any changes in your medications at this point. I think you're stable enough that you can see your PCP. I will see you back if needed.

## 2012-08-03 NOTE — Progress Notes (Signed)
Subjective:    Patient ID: Alexandria [name] is a 72 y.o. female.  HPI  Interim history:   Alexandria [name] is a very pleasant 72 y.o.-year-old Right-handed female, with an underlying history of breast cancer, HTN, HLP, chronic lung disease, Depression, anxiety, allergies, irritable bowel syndrome, who presents for followup consultation of gait disorder and Hx of fall. The patient is unaccompanied today. I first met her on 05/07/2012, at which time I suggested restarting physical therapy and completion of her TIA-stroke workup with cardiology evaluation. She had an exercise stress test on 06/04/2012 but did not have enough exercise tolerance and therefore was scheduled for a nuclear stress test which was negative on in April 2014. She also had an echocardiogram on 06/06/12, which was normal.  At the time of her first visit she reported a progressive problem with her gait stability since January 2013. She fell and hit her head in January of last year. She has had multiple falls. She was evaluated by Dr. Denton Meek at [facility] in 6/13 and 8/13 and has undergone brain MRI and MRA brain and neck. She has had PT evaluation and has gone through PT which helped. Dr. Modesto Charon started her on Plavix as she cannot take ASA and wanted her to get a echocardiogram as well. 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 She was diagnosed with R breast cancer last year and had a lumpectomy in 10/13 and has had radiation Rx. She had an invasive ductal stage I carcinoma. She had vestibular PT in the summer which helped. They said to continue the exercises, but because of the cancer Dx and treatment she had a set back.  Her atenolol was changed to metoprolol as she was having bradycardia on the atenolol and now feels better with the metoprolol. She has also been started on Fosamax and she was found to have low bone density. She has joined Geophysicist/field seismologist  and is also going to start breast cancer rehabilitation. She overall reports improvement in her balance and gait after a recent round of physical therapy.     Her Past Medical History Is Significant For: Past Medical History  Diagnosis Date  . Allergy   . Hearing loss     bilateral hearing aids  . Hyperlipidemia   . GERD (gastroesophageal reflux disease)   . COPD (chronic obstructive pulmonary disease)   . 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 problems with balance  . IBS (irritable bowel syndrome)   . History of kidney stones   . 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 Traumatic hemopneumothorax 1982    left  . PVD (peripheral vascular disease)     varicose veins - left worse than right  . Anxiety   . Depression   . Hypertension     under control, has been on med. since age 65  . Spinal stenosis   . Arthritis     left knee, hands, back  . Palpitations   . Abrasion of finger of left hand 12/29/2011    left middle   . Dental crowns present   . Breast cancer 12/2011    right, ER+, PR -, Her 2 -  . Asthma     daily and prn inhalers  . Long term current use of aromatase inhibitor 05/08/2012  . Hx of radiation  therapy 03/12/12 -04/13/12    right breast    Her Past Surgical History Is Significant For: Past Surgical History  Procedure Laterality Date  . Vein surgery      vein ablation left leg  . Knee surgery  1980s    left  . Orif ankle fracture  1980s    right   . Ankle hardware removal      right  . Tubal ligation  1976  . Cholecystectomy  1990s  . Transobturator sling  07/05/2005  . Cystoscopy  07/05/2005  . Breast biopsy  1976    right  . Breast lumpectomy  2013    right with snbx    Her Family History Is Significant For: Family History  Problem Relation Age of Onset  . Cancer Maternal Aunt     breast  . Cancer Paternal Aunt     Multiple Myeloma  . Stomach cancer Paternal Uncle   .  Breast cancer Paternal Uncle   . Breast cancer Cousin     early 68s  . Breast cancer Paternal Aunt     late 59s-60s  . Heart attack Father     Died age 43  . Heart attack Paternal Grandfather   . Breast cancer Paternal Aunt     bilateral breast cancer  . Dementia Paternal Aunt   . Congestive Heart Failure Paternal Uncle     Ischemic  . Congestive Heart Failure Paternal Uncle     Ischemic    Her Social History Is Significant For: History   Social History  . Marital Status: Married    Spouse Name: N/A    Number of Children: N/A  . Years of Education: N/A   Social History Main Topics  . Smoking status: Former Smoker -- 0.50 packs/day for 15 years    Types: Cigarettes    Quit date: 08/28/1993  . Smokeless tobacco: Never Used  . Alcohol Use: Yes     Comment: occasionally  . Drug Use: No  . Sexually Active: Not Currently   Other Topics Concern  . None   Social History Narrative   Cares for her husband who is not in good health.      Her Allergies Are:  Allergies  Allergen Reactions  . Aspirin Hives  . Chocolate Hives and Other (See Comments)    RUNNY NOSE   . Coffee Bean Extract Hives and Other (See Comments)    RUNNY NOSE   . Ibuprofen Hives and Other (See Comments)    RUNNY NOSE  . Oxycodone Hcl Hives and Nausea And Vomiting  . Penicillins Hives  . Shrimp (Shellfish Allergy) Hives and Other (See Comments)    RUNNY NOSE   . Sulfonamide Derivatives Hives  . Augmentin (Amoxicillin-Pot Clavulanate) Itching and Rash  . Eggs Or Egg-Derived Products Nausea Only  :   Her Current Medications Are:  Outpatient Encounter Prescriptions as of 08/03/2012  Medication Sig Dispense Refill  . albuterol (PROVENTIL HFA;VENTOLIN HFA) 108 (90 BASE) MCG/ACT inhaler Inhale 2 puffs into the lungs every 6 (six) hours as needed. For shortness of breath and wheezing  18 g  3  . alendronate (FOSAMAX) 70 MG tablet Take 1 tablet (70 mg total) by mouth every 7 (seven) days. Take with a  full glass of water on an empty stomach.  12 tablet  6  . amLODipine (NORVASC) 5 MG tablet Take 1 tablet (5 mg total) by mouth daily.  90 tablet  3  . anastrozole (ARIMIDEX) 1 MG  tablet Take 1 mg by mouth daily.      Marland Kitchen atorvastatin (LIPITOR) 20 MG tablet Twice weekly      . b complex vitamins tablet Take 1 tablet by mouth daily. PLUS FOLIC ACID PLUS VIT. C      . Biotin 5000 MCG TABS Take 1 tablet by mouth daily.       . Cholecalciferol (VITAMIN D-3) 5000 UNITS TABS Take 1,000 Units by mouth daily.       . clopidogrel (PLAVIX) 75 MG tablet TAKE 1 TABLET DAILY.  90 tablet  3  . co-enzyme Q-10 50 MG capsule Take 200 mg by mouth daily.      Marland Kitchen escitalopram (LEXAPRO) 10 MG tablet Take 10 mg by mouth daily.       . Fluticasone-Salmeterol (ADVAIR DISKUS) 100-50 MCG/DOSE AEPB Inhale 1 puff into the lungs 2 (two) times daily at 10 AM and 5 PM.  60 each  11  . glucosamine-chondroitin 500-400 MG tablet Take 1 tablet by mouth 2 (two) times daily. 1500/1200 MG      . hydrochlorothiazide (HYDRODIURIL) 12.5 MG tablet Take 12.5 mg by mouth daily.      Marland Kitchen loratadine (CLARITIN) 10 MG tablet Take 10 mg by mouth daily.        . LUTEIN PO Take 1 tablet by mouth daily.       . metoprolol succinate (TOPROL-XL) 25 MG 24 hr tablet Take 1 tablet (25 mg total) by mouth daily. Take with or immediately following a meal.  30 tablet  11  . pantoprazole (PROTONIX) 40 MG tablet TAKE 1 TABLET EACH DAY.  90 tablet  4  . potassium chloride SA (K-DUR,KLOR-CON) 20 MEQ tablet 2 tabs po qd      . Probiotic Product (ALIGN PO) Take 1 tablet by mouth daily.       Marland Kitchen selenium 50 MCG TABS Take by mouth daily. 200mg  qd      . traMADol (ULTRAM) 50 MG tablet Take 1 tablet (50 mg total) by mouth at bedtime.  30 tablet  6  . cyclobenzaprine (FLEXERIL) 5 MG tablet Take 5 mg by mouth 3 (three) times daily as needed. For muscle spasms      . fluticasone (FLONASE) 50 MCG/ACT nasal spray Place 2 sprays into the nose daily.  16 g  11  .  HYDROcodone-acetaminophen (NORCO) 10-325 MG per tablet Take 1 tablet by mouth every 6 (six) hours as needed for pain.  20 tablet  0  . meloxicam (MOBIC) 15 MG tablet Take 2 tablets (30 mg total) by mouth daily.  100 tablet  3   No facility-administered encounter medications on file as of 08/03/2012.   Review of Systems  Constitutional: Positive for fatigue.  Respiratory:       Snoring  Cardiovascular: Positive for leg swelling.  Musculoskeletal: Positive for myalgias and arthralgias.  Psychiatric/Behavioral:       Insomnia, sleepiness    Objective:  Neurologic Exam  Physical Exam Physical Examination:   Filed Vitals:   08/03/12 1202  BP: 124/72  Pulse: 61  Temp: 97 F (36.1 C)    General Examination: The patient is a very pleasant 72 y.o. female in no acute distress. She appears well-developed and well-nourished and well groomed.   HEENT: Normocephalic, atraumatic, pupils are equal, round and reactive to light and accommodation. Extraocular tracking is good without nystagmus noted. Hearing is grossly intact. Neck is supple with full range of motion and she does not have any symptoms of  vertigo upon sudden changes in neck position. Face is symmetric with normal facial animation and speech is clear. Oropharynx exam reveals a fairly patent airway and mild mouth dryness. Tongue protrudes centrally and palate elevates symmetrically. Chest is clear to auscultation without wheezing or rhonchi noted. Heart sounds are normal without murmurs, rubs or gallops noted. Abdomen is soft, nontender with normal bowel sounds appreciated. She has varicose and spider veins throughout her legs.  Neurologically: Mental status: The patient is awake, alert and oriented in all 4 spheres. Her memory, attention, language and knowledge are appropriate. Cranial nerves are as described under HEENT exam: In addition she has normal shoulder shrug. Motor exam: Normal bulk, strength and tone is noted. She has no  drift and no tremor or rebound. On Romberg testing she has less swaying but no corrective steps. Cerebellar testing shows no dysmetria or intention tremor or truncal ataxia. Sensory exam is intact to light touch. Her posture is age-appropriate. She walks with more confidence than last time. She turns en bloc. Tandem walk is unremarkable. Intact toe and heel stance is noted.               Assessment and Plan:    In summary, Alexandria [name] is a very pleasant 72 y.o.-year old female with a history of Gait disturbance and recurrent falls, improved. She has benefited from physical therapy and is advised to continue with regular walking exercises and use caution, not to overdo things. She is advised to change positions slowly and to keep very well hydrated. Her workup has not shown any sinister reason for her gait disturbance. She had a prior lacunar stroke and therefore has been advised to continue with her Plavix for secondary stroke prevention. She is advised that since she is stable I will see her back on an as-needed basis and she can followup with her primary care physician as previously scheduled. She was in agreement.

## 2012-08-07 ENCOUNTER — Ambulatory Visit: Payer: Medicare Other | Admitting: Physical Therapy

## 2012-08-09 ENCOUNTER — Ambulatory Visit: Payer: Medicare Other | Admitting: Physical Therapy

## 2012-08-14 ENCOUNTER — Ambulatory Visit: Payer: Medicare Other | Attending: Oncology

## 2012-08-14 DIAGNOSIS — IMO0001 Reserved for inherently not codable concepts without codable children: Secondary | ICD-10-CM | POA: Insufficient documentation

## 2012-08-14 DIAGNOSIS — M25519 Pain in unspecified shoulder: Secondary | ICD-10-CM | POA: Insufficient documentation

## 2012-08-14 DIAGNOSIS — C50919 Malignant neoplasm of unspecified site of unspecified female breast: Secondary | ICD-10-CM | POA: Insufficient documentation

## 2012-08-14 DIAGNOSIS — M25619 Stiffness of unspecified shoulder, not elsewhere classified: Secondary | ICD-10-CM | POA: Insufficient documentation

## 2012-08-14 DIAGNOSIS — R293 Abnormal posture: Secondary | ICD-10-CM | POA: Insufficient documentation

## 2012-08-17 ENCOUNTER — Ambulatory Visit: Payer: Medicare Other

## 2012-08-19 NOTE — Progress Notes (Signed)
OFFICE PROGRESS NOTE  CC  [name],JEFFREY [name], MD [address] [place] [address] Dr. Lurline Hare Dr. 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  PRIOR THERAPY:  #1 patient is status post lumpectomy with sentinel lymph node biopsy. 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  #2 patient's tumor was sent to Oncotype testing with a recurrence score of 9 putting her at risk for distant recurrence of only 7%. This is very acceptable  #3 patient will first proceed with radiation therapy this will then be followed by antiestrogen therapy consisting of an aromatase inhibitor.patient has completed her radiation as of January 2014.  #4 proceed with Arimidex 1 mg daily starting 05/08/2012. Total of 5 years of therapy is planned.  CURRENT THERAPY: Arimidex 1 mg daily  INTERVAL HISTORY: Alexandria [name] 72 y.o. female returns for followup visit. She began arimidex 1 milligram daily back in February of this year. She is tolerating it well. Bone density scan does reveal low bone mass. I have recommended that she begin Fosamax to prevent further deterioration of her bones.she has no nausea vomiting fevers chills night sweats no reflux symptoms. No peripheral paresthesias no vaginal bleeding or discharge no aches or pains. Remainder of the 10 point review of systems is negative.  MEDICAL HISTORY: Past Medical History  Diagnosis Date  . Allergy   . Hearing loss     bilateral hearing aids  . Hyperlipidemia   . GERD (gastroesophageal reflux disease)   . COPD (chronic obstructive pulmonary disease)   . 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 problems with balance  . IBS (irritable bowel syndrome)   . History of kidney stones   . 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 Traumatic hemopneumothorax 1982    left  . PVD (peripheral vascular disease)     varicose veins - left worse than right  . Anxiety   . Depression   . Hypertension     under control, has been on med. since age 28  . Spinal stenosis   . Arthritis     left knee, hands, back  . Palpitations   . Abrasion of finger of left hand 12/29/2011    left middle   . Dental crowns present   . Breast cancer 12/2011    right, ER+, PR -, Her 2 -  . Asthma     daily and prn inhalers  . Long term current use of aromatase inhibitor 05/08/2012  . Hx of radiation therapy 03/12/12 -04/13/12    right breast    ALLERGIES:  is allergic to aspirin; chocolate; coffee bean extract; ibuprofen; oxycodone hcl; penicillins; shrimp; sulfonamide derivatives; augmentin; and eggs or egg-derived products.  MEDICATIONS:  Current Outpatient Prescriptions  Medication Sig Dispense Refill  . amLODipine (NORVASC) 5 MG tablet Take 1 tablet (5 mg total) by mouth daily.  90 tablet  3  . anastrozole (ARIMIDEX) 1 MG tablet Take 1 mg by mouth daily.      Marland Kitchen escitalopram (LEXAPRO) 10 MG tablet Take 10 mg by mouth daily.       . Fluticasone-Salmeterol (ADVAIR DISKUS) 100-50 MCG/DOSE AEPB Inhale 1 puff into the lungs 2 (two) times daily at 10 AM and 5 PM.  60 each  11  . glucosamine-chondroitin 500-400 MG tablet  Take 1 tablet by mouth 2 (two) times daily. 1500/1200 MG      . hydrochlorothiazide (HYDRODIURIL) 12.5 MG tablet Take 12.5 mg by mouth daily.      Marland Kitchen HYDROcodone-acetaminophen (NORCO) 10-325 MG per tablet Take 1 tablet by mouth every 6 (six) hours as needed for pain.  20 tablet  0  . loratadine (CLARITIN) 10 MG tablet Take 10 mg by mouth daily.        . LUTEIN PO Take 1 tablet by mouth daily.       . metoprolol succinate (TOPROL-XL) 25 MG 24 hr tablet Take 1 tablet (25 mg total) by mouth daily. Take with or immediately following a meal.  30 tablet  11  . pantoprazole (PROTONIX) 40 MG tablet  TAKE 1 TABLET EACH DAY.  90 tablet  4  . potassium chloride SA (K-DUR,KLOR-CON) 20 MEQ tablet 2 tabs po qd      . Probiotic Product (ALIGN PO) Take 1 tablet by mouth daily.       Marland Kitchen selenium 50 MCG TABS Take by mouth daily. 200mg  qd      . albuterol (PROVENTIL HFA;VENTOLIN HFA) 108 (90 BASE) MCG/ACT inhaler Inhale 2 puffs into the lungs every 6 (six) hours as needed. For shortness of breath and wheezing  18 g  3  . alendronate (FOSAMAX) 70 MG tablet Take 1 tablet (70 mg total) by mouth every 7 (seven) days. Take with a full glass of water on an empty stomach.  12 tablet  6  . atorvastatin (LIPITOR) 20 MG tablet Twice weekly      . b complex vitamins tablet Take 1 tablet by mouth daily. PLUS FOLIC ACID PLUS VIT. C      . Biotin 5000 MCG TABS Take 1 tablet by mouth daily.       . Cholecalciferol (VITAMIN D-3) 5000 UNITS TABS Take 1,000 Units by mouth daily.       . clopidogrel (PLAVIX) 75 MG tablet TAKE 1 TABLET DAILY.  90 tablet  3  . co-enzyme Q-10 50 MG capsule Take 200 mg by mouth daily.      . cyclobenzaprine (FLEXERIL) 5 MG tablet Take 5 mg by mouth 3 (three) times daily as needed. For muscle spasms      . fluticasone (FLONASE) 50 MCG/ACT nasal spray Place 2 sprays into the nose daily.  16 g  11  . meloxicam (MOBIC) 15 MG tablet Take 2 tablets (30 mg total) by mouth daily.  100 tablet  3  . traMADol (ULTRAM) 50 MG tablet Take 1 tablet (50 mg total) by mouth at bedtime.  30 tablet  6   No current facility-administered medications for this visit.    SURGICAL HISTORY:  Past Surgical History  Procedure Laterality Date  . Vein surgery      vein ablation left leg  . Knee surgery  1980s    left  . Orif ankle fracture  1980s    right   . Ankle hardware removal      right  . Tubal ligation  1976  . Cholecystectomy  1990s  . Transobturator sling  07/05/2005  . Cystoscopy  07/05/2005  . Breast biopsy  1976    right  . Breast lumpectomy  2013    right with snbx    REVIEW OF SYSTEMS:   Pertinent items are noted in HPI.   HEALTH MAINTENANCE:   PHYSICAL EXAMINATION: Blood pressure 125/75, pulse 54, temperature 98.3 F (36.8 C), temperature source Oral, resp.  rate 20, height 5\' 4"  (1.626 m), weight 149 lb 6.4 oz (67.767 kg). Body mass index is 25.63 kg/(m^2). ECOG PERFORMANCE STATUS: 0 - Asymptomatic   Well-developed nourished female in no acute distress HEENT exam EOMI PERRLA sclerae anicteric no conjunctival pallor oral mucosa is moist neck is supple lungs are clear bilaterally cardiovascular is regular rate rhythm abdomen is soft nontender nondistended bowel sounds are present no HSM extremities no edema neuro patient's alert oriented otherwise nonfocal. Breast exam right breast reveals well-healed incisional scar no masses no nipple discharge and retraction or inversion. Left breast no masses or nipple discharge   LABORATORY DATA: Lab Results  Component Value Date   WBC 5.3 08/01/2012   HGB 12.9 08/01/2012   HCT 37.7 08/01/2012   MCV 88.5 08/01/2012   PLT 195 08/01/2012      Chemistry      Component Value Date/Time   NA 141 08/01/2012 1021   NA 141 12/30/2011 1208   K 4.3 08/01/2012 1021   K 3.8 12/30/2011 1208   CL 106 08/01/2012 1021   CL 102 12/30/2011 1208   CO2 27 08/01/2012 1021   CO2 30 12/30/2011 1208   BUN 18.6 08/01/2012 1021   BUN 20 12/30/2011 1208   CREATININE 1.0 08/01/2012 1021   CREATININE 0.86 12/30/2011 1208      Component Value Date/Time   CALCIUM 9.2 08/01/2012 1021   CALCIUM 9.2 12/30/2011 1208   ALKPHOS 65 08/01/2012 1021   ALKPHOS 58 06/04/2012 1039   AST 24 08/01/2012 1021   AST 27 06/04/2012 1039   ALT 16 08/01/2012 1021   ALT 20 06/04/2012 1039   BILITOT 1.28* 08/01/2012 1021   BILITOT 1.7* 06/04/2012 1039     1. A sample was sent to [facility] for oncotype testing. The patient's recurrence score is 9. Those patients who had a recurrence score of 9 had an average rate of distant recurrence of 7%. (JBK:gt, 02/02/12) Pecola Leisure  MD Pathologist, Electronic Signature ( Signed 02/02/2012) 1. CHROMOGENIC IN-SITU HYBRIDIZATION Interpretation HER-2/NEU BY [facility] - NO AMPLIFICATION OF HER-2 DETECTED. THE RATIO OF HER-2: CEP 17 SIGNALS WAS 1.19. Reference range: Ratio: HER2:CEP17 < 1.8 - gene amplification not observed Ratio: HER2:CEP 17 1.8-2.2 - equivocal result Ratio: HER2:CEP17 > 2.2 - gene amplification observed Pecola Leisure MD Pathologist, Electronic Signature ( Signed 01/10/2012) FINAL DIAGNOSIS Diagnosis 1. Breast, lumpectomy, Right - INVASIVE DUCTAL CARCINOMA, GRADE I / III SPANNING 1.6 CM. - DUCTAL CARCINOMA IN SITU, LOW GRADE. 1 of 3 FINAL for Feeny, Rande L ([id number]) Diagnosis(continued) - THE SURGICAL RESECTION MARGINS ARE NEGATIVE FOR CARCINOMA. - SEE ONCOLOGY TABLE BELOW. 2. Lymph node, sentinel, biopsy, Right - THERE IS NO EVIDENCE OF CARCINOMA IN 1 OF 1 LYMPH NODE (0/1). Microscopic Comment 1. BREAST, INVASIVE TUMOR, WITH LYMPH NODE SAMPLING Specimen, including laterality: Right breast Procedure: Needle localized lumpectomy Grade: I Tubule formation: II Nuclear pleomorphism: II Mitotic: I Tumor size (glass slide measurement): 1.6 cm Margins: Greater than 0.3 cm to all margins (gross measurements) Lymphovascular invasion: Not identified Ductal carcinoma in situ: Present, low grade Extensive intraductal carcinoma: Not identified. Lobular neoplasia: Not identified Tumor focality: Unifocal Treatment effect: N/A Extent of tumor: Confined to breast parenchyma Lymph nodes: # examined: 1 Lymph nodes with metastasis: 0 Breast prognostic profile: Case [id number] Estrogen receptor: 100%, strong staining intensity Progesterone receptor: 0% Her 2 neu: No amplification was detected, the ratio was 1.14, Her 2 neu by [facility] will be repeated on the current case and the results  reported separately. Ki-67: 27% Non-neoplastic breast: Healing biopsy site TNM: pT1c, pN0 Comments: The malignant  cells are positive for e-cadherin. (JBK:caf 01/05/12) Pecola Leisure MD Pathologist, Electronic Signature (Case signed 01/06/2012) Specimen Gross and Clinical Information Specimen(s) Obtained: 1. Breast, lumpectomy, Right 2. Lymph node, sentinel, biopsy, Right Specimen Clinical Information 1. Right breast cancer  RADIOGRAPHIC STUDIES:  No results found.  ASSESSMENT: 72 year old female with  #1 stage I invasive ductal carcinoma measuring 1.6 cm ER positive PR negative HER-2/neu negative. She is status pos tright breast lumpectomy with sentinel lymph node biopsy. All sentinel nodes were negative for metastatic disease. Patient had Oncotype testing with a recurrence score of only 9 giving her a 7% distance recurrence with antiestrogen therapy only. Patient would not benefit from chemotherapy.  #2 recommendation is for patient to proceed with radiation therapy first then followed by antiestrogen therapy consisting of an aromatase inhibitor for a total of 5 years. We discussed risks and benefits of of antiestrogen therapy as well as Oncotype testing.  #3 patient has now completed radiation therapy to the right breast. Overall she tolerated it well. She completed this towards the end of January. She has no residual effects from it.  #4 patient will now begin antiestrogen therapy consisting of Arimidex 1 mg daily. Risks and benefits of therapy were discussed with the patient very clearly. She was also given literature. She understands the rationale for this. A prescription was sent to her pharmacy for Arimidex 1 mg daily.  #5 patient had a bone density scan performed and it showed low bone mass. I have recommended that she take Fosamax weekly. Risks and benefits of Fosamax were discussed with her. Prescription was sent to her pharmacy and literature on it was given to the patient.   PLAN:  #1 begin Fosamax 70 mg every week Aleve.  #2 continue Arimidex 1 mg daily.  #3 patient will be seen back  in 6 months time for followup.  All questions were answered. The patient knows to call the clinic with any problems, questions or concerns. We can certainly see the patient much sooner if necessary.  I spent 25 minutes counseling the patient face to face. The total time spent in the appointment was 30 minutes.    Drue Second, MD Medical/Oncology [facility] [phone] (beeper) [phone] (Office)

## 2012-08-21 ENCOUNTER — Ambulatory Visit: Payer: Medicare Other

## 2012-08-24 ENCOUNTER — Ambulatory Visit: Payer: Medicare Other

## 2012-08-24 ENCOUNTER — Ambulatory Visit (INDEPENDENT_AMBULATORY_CARE_PROVIDER_SITE_OTHER): Payer: Medicare Other | Admitting: General Surgery

## 2012-10-17 ENCOUNTER — Other Ambulatory Visit: Payer: Self-pay

## 2012-10-22 ENCOUNTER — Other Ambulatory Visit: Payer: Self-pay | Admitting: Family Medicine

## 2012-12-11 ENCOUNTER — Telehealth (INDEPENDENT_AMBULATORY_CARE_PROVIDER_SITE_OTHER): Payer: Self-pay

## 2012-12-11 ENCOUNTER — Other Ambulatory Visit (INDEPENDENT_AMBULATORY_CARE_PROVIDER_SITE_OTHER): Payer: Self-pay

## 2012-12-11 ENCOUNTER — Telehealth (INDEPENDENT_AMBULATORY_CARE_PROVIDER_SITE_OTHER): Payer: Self-pay | Admitting: *Deleted

## 2012-12-11 DIAGNOSIS — Z853 Personal history of malignant neoplasm of breast: Secondary | ICD-10-CM

## 2012-12-11 NOTE — Telephone Encounter (Signed)
Pt called stating she is due for yearly follow up for breast cancer. She called [facility] to make yearly appt for mgm and was advised out office will have to place an order before they could set appt. I have placed order in epic and sent order to referral coordinator to set up mgm.

## 2012-12-11 NOTE — Telephone Encounter (Signed)
I called pt and informed her of her appt at [facility] for her yearly mammogram on 12/20/12 with an arrival time of 3:15.

## 2012-12-13 ENCOUNTER — Telehealth: Payer: Self-pay | Admitting: Family Medicine

## 2012-12-13 DIAGNOSIS — H9193 Unspecified hearing loss, bilateral: Secondary | ICD-10-CM

## 2012-12-13 DIAGNOSIS — Z461 Encounter for fitting and adjustment of hearing aid: Secondary | ICD-10-CM

## 2012-12-13 NOTE — Telephone Encounter (Signed)
Pt needs a referral to [facility] for hearing loss evaluation and hearing aids adjustment. Please fax order to [phone]

## 2012-12-14 ENCOUNTER — Ambulatory Visit (INDEPENDENT_AMBULATORY_CARE_PROVIDER_SITE_OTHER): Payer: Medicare Other

## 2012-12-14 DIAGNOSIS — Z23 Encounter for immunization: Secondary | ICD-10-CM

## 2012-12-20 ENCOUNTER — Ambulatory Visit
Admission: RE | Admit: 2012-12-20 | Discharge: 2012-12-20 | Disposition: A | Discharge status: Home / Self Care | Payer: Medicare Other | Source: Ambulatory Visit | Attending: General Surgery | Admitting: General Surgery

## 2012-12-20 DIAGNOSIS — Z853 Personal history of malignant neoplasm of breast: Secondary | ICD-10-CM

## 2013-01-18 ENCOUNTER — Telehealth: Payer: Self-pay | Admitting: Family Medicine

## 2013-01-18 ENCOUNTER — Other Ambulatory Visit: Payer: Self-pay | Admitting: *Deleted

## 2013-01-18 MED ORDER — HYDROCODONE-HOMATROPINE 5-1.5 MG/5ML PO SYRP
5.0000 mL | ORAL_SOLUTION | Freq: Three times a day (TID) | ORAL | Status: DC | PRN
Start: 2013-01-18 — End: 2013-01-21

## 2013-01-18 NOTE — Telephone Encounter (Signed)
The rx HYDROcodone-homatropine (HYCODAN) 5-1.5 MG/5ML syrup Is now a controlled substance and pt needs to pu rx from office.

## 2013-01-21 ENCOUNTER — Encounter: Payer: Self-pay | Admitting: Family Medicine

## 2013-01-21 ENCOUNTER — Ambulatory Visit (INDEPENDENT_AMBULATORY_CARE_PROVIDER_SITE_OTHER): Payer: Medicare Other | Admitting: Family Medicine

## 2013-01-21 VITALS — BP 130/80 | Temp 98.5°F | Wt 156.0 lb

## 2013-01-21 DIAGNOSIS — J45909 Unspecified asthma, uncomplicated: Secondary | ICD-10-CM

## 2013-01-21 DIAGNOSIS — J449 Chronic obstructive pulmonary disease, unspecified: Secondary | ICD-10-CM

## 2013-01-21 MED ORDER — HYDROCODONE-HOMATROPINE 5-1.5 MG/5ML PO SYRP: ORAL_SOLUTION | ORAL | Status: DC

## 2013-01-21 MED ORDER — HYDROCODONE-HOMATROPINE 5-1.5 MG/5ML PO SYRP: 5.0000 mL | ORAL_SOLUTION | Freq: Three times a day (TID) | ORAL | Status: DC | PRN

## 2013-01-21 MED ORDER — GENTAMICIN FORTIFIED OPHTHALMIC SOLUTION: OPHTHALMIC | Status: DC

## 2013-01-21 MED ORDER — DOXYCYCLINE HYCLATE 100 MG PO TABS: 100.0000 mg | ORAL_TABLET | Freq: Two times a day (BID) | ORAL | Status: DC

## 2013-01-21 MED ORDER — PREDNISONE 20 MG PO TABS: ORAL_TABLET | ORAL | Status: DC

## 2013-01-21 NOTE — Telephone Encounter (Signed)
Rx was printed but shred due to Dr Tawanna Cooler calling and speaking with the pharmacy

## 2013-01-21 NOTE — Progress Notes (Signed)
Pre visit review using our clinic review tool, if applicable. No additional management support is needed unless otherwise documented below in the visit note. 

## 2013-01-21 NOTE — Patient Instructions (Signed)
Rest at home  Drink lots of water  Run a vaporizer in your bedroom Prednisone 20 mg,,,,,,,,,,,,,,,, 2 tabs x3 days or until you feel a lot better and then begin to taper as outlined  Hydromet..........Marland Kitchen 1/2-1 teaspoon 3 times daily. For cough  Doxycycline 100 mg.......... one twice daily till bilateral empty  Eye drops........... one drop each eye 3 times daily till clear

## 2013-01-21 NOTE — Progress Notes (Signed)
  Subjective:    Patient ID: Alexandria [name], female    DOB: Nov 06, 1940, 72 y.o.   MRN: [id number]  HPI Alexandria [name] is a 72 year old just widowed female her husband Alexandria [name] died 10 days ago who comes in with a five-day history of sore throat head congestion cough and conjunctivitis  Over the last couple days, seems to gotten worse she feels like she is wheezing. 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

## 2013-02-13 ENCOUNTER — Encounter: Payer: Self-pay | Admitting: Oncology

## 2013-02-13 ENCOUNTER — Other Ambulatory Visit (HOSPITAL_BASED_OUTPATIENT_CLINIC_OR_DEPARTMENT_OTHER): Payer: Medicare Other | Admitting: Lab

## 2013-02-13 ENCOUNTER — Ambulatory Visit (HOSPITAL_BASED_OUTPATIENT_CLINIC_OR_DEPARTMENT_OTHER): Payer: Medicare Other | Admitting: Oncology

## 2013-02-13 ENCOUNTER — Telehealth: Payer: Self-pay | Admitting: Oncology

## 2013-02-13 VITALS — BP 134/80 | HR 62 | Temp 98.1°F | Resp 18 | Ht 64.0 in | Wt 148.6 lb

## 2013-02-13 DIAGNOSIS — M899 Disorder of bone, unspecified: Secondary | ICD-10-CM

## 2013-02-13 DIAGNOSIS — C50219 Malignant neoplasm of upper-inner quadrant of unspecified female breast: Secondary | ICD-10-CM

## 2013-02-13 DIAGNOSIS — C50211 Malignant neoplasm of upper-inner quadrant of right female breast: Secondary | ICD-10-CM

## 2013-02-13 DIAGNOSIS — Z170 Estrogen receptor positive status [ER+]: Secondary | ICD-10-CM

## 2013-02-13 LAB — COMPREHENSIVE METABOLIC PANEL (CC13)
AST: 19 U/L (ref 5–34)
Alkaline Phosphatase: 50 U/L (ref 40–150)
BUN: 22.3 mg/dL (ref 7.0–26.0)
Calcium: 9.1 mg/dL (ref 8.4–10.4)
Chloride: 107 mEq/L (ref 98–109)
Creatinine: 0.9 mg/dL (ref 0.6–1.1)

## 2013-02-13 LAB — CBC WITH DIFFERENTIAL/PLATELET
Basophils Absolute: 0.1 10*3/uL (ref 0.0–0.1)
EOS%: 2.7 % (ref 0.0–7.0)
HCT: 37 % (ref 34.8–46.6)
HGB: 12.2 g/dL (ref 11.6–15.9)
LYMPH%: 25.3 % (ref 14.0–49.7)
MCH: 30.1 pg (ref 25.1–34.0)
MCV: 91.2 fL (ref 79.5–101.0)
MONO%: 8.6 % (ref 0.0–14.0)
NEUT%: 62.2 % (ref 38.4–76.8)
Platelets: 163 10*3/uL (ref 145–400)

## 2013-02-13 NOTE — Progress Notes (Signed)
OFFICE PROGRESS NOTE  CC  [name],JEFFREY [name], MD [address] [place] [address] Dr. Lurline Hare Dr. 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  PRIOR THERAPY:  #1 patient is status post lumpectomy with sentinel lymph node biopsy. 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  #2 patient's tumor was sent to Oncotype testing with a recurrence score of 9 putting her at risk for distant recurrence of only 7%. This is very acceptable  #3 patient will first proceed with radiation therapy this will then be followed by antiestrogen therapy consisting of an aromatase inhibitor.patient has completed her radiation as of January 2014.  #4 proceed with Arimidex 1 mg daily starting 05/08/2012. Total of 5 years of therapy is planned.  CURRENT THERAPY: curative intent Arimidex 1 mg daily  INTERVAL HISTORY: [name] 72 y.o. female returns for followup visit. Overall patient is doing well. She is denying any fevers chills night sweats headaches shortness of breath chest pains palpitations no myalgias and arthralgias. Patient has had since her husband passed away at the end of 12/31/2012. She is coping as well as you possibly can. Remainder of the 10 point review of systems is negative.  MEDICAL HISTORY: Past Medical History  Diagnosis Date  . Allergy   . Hearing loss     bilateral hearing aids  . Hyperlipidemia   . GERD (gastroesophageal reflux disease)   . COPD (chronic obstructive pulmonary disease)   . 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 problems with balance  . IBS (irritable bowel syndrome)   . History of kidney stones   . 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 Traumatic hemopneumothorax 1982    left  . PVD  (peripheral vascular disease)     varicose veins - left worse than right  . Anxiety   . Depression   . Hypertension     under control, has been on med. since age 23  . Spinal stenosis   . Arthritis     left knee, hands, back  . Palpitations   . Abrasion of finger of left hand 12/29/2011    left middle   . Dental crowns present   . Breast cancer 01/01/2012    right, ER+, PR -, Her 2 -  . Asthma     daily and prn inhalers  . Long term current use of aromatase inhibitor 05/08/2012  . Hx of radiation therapy 03/12/12 -04/13/12    right breast    ALLERGIES:  is allergic to aspirin; chocolate; coffee bean extract; ibuprofen; oxycodone hcl; penicillins; shrimp; sulfonamide derivatives; augmentin; and eggs or egg-derived products.  MEDICATIONS:  Current Outpatient Prescriptions  Medication Sig Dispense Refill  . alendronate (FOSAMAX) 70 MG tablet Take 1 tablet (70 mg total) by mouth every 7 (seven) days. Take with a full glass of water on an empty stomach.  12 tablet  6  . amLODipine (NORVASC) 5 MG tablet Take 1 tablet (5 mg total) by mouth daily.  90 tablet  3  . anastrozole (ARIMIDEX) 1 MG tablet Take 1 mg by mouth daily.      Marland Kitchen atorvastatin (LIPITOR) 20 MG tablet Twice weekly      . b complex vitamins tablet Take 1 tablet by mouth daily. PLUS FOLIC ACID PLUS VIT. C      .  Biotin 5000 MCG TABS Take 1 tablet by mouth daily.       . Cholecalciferol (VITAMIN D-3) 5000 UNITS TABS Take 1,000 Units by mouth daily.       . clopidogrel (PLAVIX) 75 MG tablet TAKE 1 TABLET DAILY.  90 tablet  3  . co-enzyme Q-10 50 MG capsule Take 200 mg by mouth daily.      . cyclobenzaprine (FLEXERIL) 5 MG tablet Take 5 mg by mouth 3 (three) times daily as needed. For muscle spasms      . escitalopram (LEXAPRO) 10 MG tablet Take 10 mg by mouth daily.       . fluticasone (FLONASE) 50 MCG/ACT nasal spray Place 2 sprays into the nose daily.  16 g  11  . Fluticasone-Salmeterol (ADVAIR DISKUS) 100-50 MCG/DOSE AEPB  Inhale 1 puff into the lungs 2 (two) times daily at 10 AM and 5 PM.  60 each  11  . glucosamine-chondroitin 500-400 MG tablet Take 1 tablet by mouth 2 (two) times daily. 1500/1200 MG      . hydrochlorothiazide (HYDRODIURIL) 12.5 MG tablet Take 12.5 mg by mouth daily.      Marland Kitchen loratadine (CLARITIN) 10 MG tablet Take 10 mg by mouth daily.        . LUTEIN PO Take 1 tablet by mouth daily.       . meloxicam (MOBIC) 15 MG tablet TAKE (2) TABLETS DAILY.  62 tablet  3  . metoprolol succinate (TOPROL-XL) 25 MG 24 hr tablet Take 1 tablet (25 mg total) by mouth daily. Take with or immediately following a meal.  30 tablet  11  . pantoprazole (PROTONIX) 40 MG tablet TAKE 1 TABLET EACH DAY.  90 tablet  4  . potassium chloride SA (K-DUR,KLOR-CON) 20 MEQ tablet 2 tabs po qd      . Probiotic Product (ALIGN PO) Take 1 tablet by mouth daily.       Marland Kitchen selenium 50 MCG TABS Take by mouth daily. 200mg  qd      . traMADol (ULTRAM) 50 MG tablet Take 1 tablet (50 mg total) by mouth at bedtime.  30 tablet  6  . albuterol (PROVENTIL HFA;VENTOLIN HFA) 108 (90 BASE) MCG/ACT inhaler Inhale 2 puffs into the lungs every 6 (six) hours as needed. For shortness of breath and wheezing  18 g  3   No current facility-administered medications for this visit.    SURGICAL HISTORY:  Past Surgical History  Procedure Laterality Date  . Vein surgery      vein ablation left leg  . Knee surgery  1980s    left  . Orif ankle fracture  1980s    right   . Ankle hardware removal      right  . Tubal ligation  1976  . Cholecystectomy  1990s  . Transobturator sling  07/05/2005  . Cystoscopy  07/05/2005  . Breast biopsy  1976    right  . Breast lumpectomy  2013    right with snbx    REVIEW OF SYSTEMS:  Pertinent items are noted in HPI.   HEALTH MAINTENANCE:   PHYSICAL EXAMINATION: Blood pressure 134/80, pulse 62, temperature 98.1 F (36.7 C), temperature source Oral, resp. rate 18, height 5\' 4"  (1.626 m), weight 148 lb 9 oz (67.388  kg). Body mass index is 25.49 kg/(m^2). ECOG PERFORMANCE STATUS: 0 - Asymptomatic   Well-developed nourished female in no acute distress HEENT exam EOMI PERRLA sclerae anicteric no conjunctival pallor oral mucosa is moist neck is  supple lungs are clear bilaterally cardiovascular is regular rate rhythm abdomen is soft nontender nondistended bowel sounds are present no HSM extremities no edema neuro patient's alert oriented otherwise nonfocal. Breast exam right breast reveals well-healed incisional scar no masses no nipple discharge and retraction or inversion. Left breast no masses or nipple discharge   LABORATORY DATA: Lab Results  Component Value Date   WBC 6.3 02/13/2013   HGB 12.2 02/13/2013   HCT 37.0 02/13/2013   MCV 91.2 02/13/2013   PLT 163 02/13/2013      Chemistry      Component Value Date/Time   NA 141 08/01/2012 1021   NA 141 12/30/2011 1208   K 4.3 08/01/2012 1021   K 3.8 12/30/2011 1208   CL 106 08/01/2012 1021   CL 102 12/30/2011 1208   CO2 27 08/01/2012 1021   CO2 30 12/30/2011 1208   BUN 18.6 08/01/2012 1021   BUN 20 12/30/2011 1208   CREATININE 1.0 08/01/2012 1021   CREATININE 0.86 12/30/2011 1208      Component Value Date/Time   CALCIUM 9.2 08/01/2012 1021   CALCIUM 9.2 12/30/2011 1208   ALKPHOS 65 08/01/2012 1021   ALKPHOS 58 06/04/2012 1039   AST 24 08/01/2012 1021   AST 27 06/04/2012 1039   ALT 16 08/01/2012 1021   ALT 20 06/04/2012 1039   BILITOT 1.28* 08/01/2012 1021   BILITOT 1.7* 06/04/2012 1039     1. A sample was sent to [facility] for oncotype testing. The patient's recurrence score is 9. Those patients who had a recurrence score of 9 had an average rate of distant recurrence of 7%. (JBK:gt, 02/02/12) Pecola Leisure MD Pathologist, Electronic Signature ( Signed 02/02/2012) 1. CHROMOGENIC IN-SITU HYBRIDIZATION Interpretation HER-2/NEU BY [facility] - NO AMPLIFICATION OF HER-2 DETECTED. THE RATIO OF HER-2: CEP 17 SIGNALS WAS 1.19. Reference range: Ratio:  HER2:CEP17 < 1.8 - gene amplification not observed Ratio: HER2:CEP 17 1.8-2.2 - equivocal result Ratio: HER2:CEP17 > 2.2 - gene amplification observed Pecola Leisure MD Pathologist, Electronic Signature ( Signed 01/10/2012) FINAL DIAGNOSIS Diagnosis 1. Breast, lumpectomy, Right - INVASIVE DUCTAL CARCINOMA, GRADE I / III SPANNING 1.6 CM. - DUCTAL CARCINOMA IN SITU, LOW GRADE. 1 of 3 FINAL for Loredo, Lyndzie L ([id number]) Diagnosis(continued) - THE SURGICAL RESECTION MARGINS ARE NEGATIVE FOR CARCINOMA. - SEE ONCOLOGY TABLE BELOW. 2. Lymph node, sentinel, biopsy, Right - THERE IS NO EVIDENCE OF CARCINOMA IN 1 OF 1 LYMPH NODE (0/1). Microscopic Comment 1. BREAST, INVASIVE TUMOR, WITH LYMPH NODE SAMPLING Specimen, including laterality: Right breast Procedure: Needle localized lumpectomy Grade: I Tubule formation: II Nuclear pleomorphism: II Mitotic: I Tumor size (glass slide measurement): 1.6 cm Margins: Greater than 0.3 cm to all margins (gross measurements) Lymphovascular invasion: Not identified Ductal carcinoma in situ: Present, low grade Extensive intraductal carcinoma: Not identified. Lobular neoplasia: Not identified Tumor focality: Unifocal Treatment effect: N/A Extent of tumor: Confined to breast parenchyma Lymph nodes: # examined: 1 Lymph nodes with metastasis: 0 Breast prognostic profile: Case [id number] Estrogen receptor: 100%, strong staining intensity Progesterone receptor: 0% Her 2 neu: No amplification was detected, the ratio was 1.14, Her 2 neu by [facility] will be repeated on the current case and the results reported separately. Ki-67: 27% Non-neoplastic breast: Healing biopsy site TNM: pT1c, pN0 Comments: The malignant cells are positive for e-cadherin. (JBK:caf 01/05/12) Pecola Leisure MD Pathologist, Electronic Signature (Case signed 01/06/2012) Specimen Gross and Clinical Information Specimen(s) Obtained: 1. Breast, lumpectomy, Right 2. Lymph node,  sentinel, biopsy, Right Specimen  Clinical Information 1. Right breast cancer  RADIOGRAPHIC STUDIES:  No results found.  ASSESSMENT: 72 year old female with  #1 stage I invasive ductal carcinoma measuring 1.6 cm ER positive PR negative HER-2/neu negative. She is status pos tright breast lumpectomy with sentinel lymph node biopsy. All sentinel nodes were negative for metastatic disease. Patient had Oncotype testing with a recurrence score of only 9 giving her a 7% distance recurrence with antiestrogen therapy only. Patient would not benefit from chemotherapy.  #2 recommendation is for patient to proceed with radiation therapy first then followed by antiestrogen therapy consisting of an aromatase inhibitor for a total of 5 years. We discussed risks and benefits of of antiestrogen therapy as well as Oncotype testing.  #3 patient has now completed radiation therapy to the right breast. Overall she tolerated it well. She completed this towards the end of January. She has no residual effects from it.  #4 patient is on antiestrogen therapy consisting of curative intent Arimidex 1 mg daily. Risks and benefits of therapy were discussed with the patient very clearly. She was also given literature. She understands the rationale for this. A prescription was sent to her pharmacy for Arimidex 1 mg daily.  #5 low bone mass: on fosamax  PLAN:  #1 continue Arimidex 1 mg daily.  #2 low bone mass/osteopenia: Continue Fosamax q. weekly.  #3 patient will be seen back in 6 months time for followup  All questions were answered. The patient knows to call the clinic with any problems, questions or concerns. We can certainly see the patient much sooner if necessary.  I spent 25 minutes counseling the patient face to face. The total time spent in the appointment was 30 minutes.    Drue Second, MD Medical/Oncology [facility] [phone] (beeper) [phone] (Office)

## 2013-02-13 NOTE — Telephone Encounter (Signed)
, °

## 2013-02-18 ENCOUNTER — Other Ambulatory Visit: Payer: Self-pay | Admitting: Family Medicine

## 2013-02-20 ENCOUNTER — Other Ambulatory Visit: Payer: Self-pay | Admitting: *Deleted

## 2013-02-20 MED ORDER — FLUTICASONE-SALMETEROL 100-50 MCG/DOSE IN AEPB: 1.0000 | INHALATION_SPRAY | Freq: Two times a day (BID) | RESPIRATORY_TRACT | Status: DC

## 2013-03-20 ENCOUNTER — Other Ambulatory Visit: Payer: Self-pay | Admitting: Family Medicine

## 2013-03-27 ENCOUNTER — Telehealth: Payer: Self-pay | Admitting: Family Medicine

## 2013-03-27 NOTE — Telephone Encounter (Signed)
[place] is going out of the country [place] and want to no if her immunization records is up to date.

## 2013-03-28 ENCOUNTER — Other Ambulatory Visit: Payer: Self-pay | Admitting: Family Medicine

## 2013-03-28 NOTE — Telephone Encounter (Signed)
Spoke to pt told her immunizations are up to date except no record or Hep A or B. Pt stated she had them where she worked. Told her okay then you are fine. Pt said she has to take Malaria medication as precaution and wanted to know if okay with other medications. Told her I would have to check with Dr. Raliegh Ip and get back to her. Pt verbalized understanding.

## 2013-03-28 NOTE — Telephone Encounter (Signed)
Dr. Raliegh Ip, [place] and was given Malaria medication and wanted to make sure okay to take with other medications she is on before she takes it?

## 2013-03-28 NOTE — Telephone Encounter (Signed)
Pt notified okay to take Malaria medication per Dr. Raliegh Ip.

## 2013-03-28 NOTE — Telephone Encounter (Signed)
ok 

## 2013-04-18 ENCOUNTER — Other Ambulatory Visit: Payer: Self-pay | Admitting: Family Medicine

## 2013-06-13 ENCOUNTER — Other Ambulatory Visit: Payer: Self-pay | Admitting: Oncology

## 2013-06-13 ENCOUNTER — Other Ambulatory Visit: Payer: Self-pay | Admitting: Cardiology

## 2013-06-13 DIAGNOSIS — C50219 Malignant neoplasm of upper-inner quadrant of unspecified female breast: Secondary | ICD-10-CM

## 2013-06-17 ENCOUNTER — Other Ambulatory Visit: Payer: Self-pay | Admitting: Oncology

## 2013-06-17 DIAGNOSIS — C50219 Malignant neoplasm of upper-inner quadrant of unspecified female breast: Secondary | ICD-10-CM

## 2013-07-10 ENCOUNTER — Other Ambulatory Visit: Payer: Self-pay | Admitting: Cardiology

## 2013-07-10 ENCOUNTER — Other Ambulatory Visit: Payer: Self-pay | Admitting: Family Medicine

## 2013-07-30 ENCOUNTER — Telehealth: Payer: Self-pay | Admitting: Physician Assistant

## 2013-07-30 NOTE — Telephone Encounter (Signed)
, °

## 2013-08-03 ENCOUNTER — Other Ambulatory Visit: Payer: Self-pay | Admitting: Family Medicine

## 2013-08-14 ENCOUNTER — Other Ambulatory Visit (HOSPITAL_BASED_OUTPATIENT_CLINIC_OR_DEPARTMENT_OTHER): Payer: Medicare Other

## 2013-08-14 ENCOUNTER — Encounter: Payer: Self-pay | Admitting: Physician Assistant

## 2013-08-14 ENCOUNTER — Other Ambulatory Visit: Payer: Self-pay | Admitting: Oncology

## 2013-08-14 ENCOUNTER — Other Ambulatory Visit: Payer: Self-pay | Admitting: Physician Assistant

## 2013-08-14 ENCOUNTER — Ambulatory Visit (HOSPITAL_BASED_OUTPATIENT_CLINIC_OR_DEPARTMENT_OTHER): Payer: Medicare Other | Admitting: Physician Assistant

## 2013-08-14 VITALS — BP 146/75 | HR 60 | Temp 98.3°F | Resp 18 | Ht 64.0 in | Wt 148.7 lb

## 2013-08-14 DIAGNOSIS — C50219 Malignant neoplasm of upper-inner quadrant of unspecified female breast: Secondary | ICD-10-CM

## 2013-08-14 DIAGNOSIS — N63 Unspecified lump in unspecified breast: Secondary | ICD-10-CM

## 2013-08-14 DIAGNOSIS — M949 Disorder of cartilage, unspecified: Secondary | ICD-10-CM

## 2013-08-14 DIAGNOSIS — N6459 Other signs and symptoms in breast: Secondary | ICD-10-CM

## 2013-08-14 DIAGNOSIS — Z79811 Long term (current) use of aromatase inhibitors: Secondary | ICD-10-CM

## 2013-08-14 DIAGNOSIS — Z170 Estrogen receptor positive status [ER+]: Secondary | ICD-10-CM

## 2013-08-14 DIAGNOSIS — M899 Disorder of bone, unspecified: Secondary | ICD-10-CM

## 2013-08-14 DIAGNOSIS — C50211 Malignant neoplasm of upper-inner quadrant of right female breast: Secondary | ICD-10-CM

## 2013-08-14 LAB — COMPREHENSIVE METABOLIC PANEL (CC13)
ALT: 16 U/L (ref 0–55)
AST: 24 U/L (ref 5–34)
Albumin: 4.1 g/dL (ref 3.5–5.0)
Alkaline Phosphatase: 61 U/L (ref 40–150)
Anion Gap: 15 mEq/L — ABNORMAL HIGH (ref 3–11)
BILIRUBIN TOTAL: 1.53 mg/dL — AB (ref 0.20–1.20)
BUN: 17 mg/dL (ref 7.0–26.0)
CO2: 23 mEq/L (ref 22–29)
CREATININE: 1 mg/dL (ref 0.6–1.1)
Calcium: 9.6 mg/dL (ref 8.4–10.4)
Chloride: 103 mEq/L (ref 98–109)
Glucose: 88 mg/dl (ref 70–140)
Potassium: 4.2 mEq/L (ref 3.5–5.1)
SODIUM: 142 meq/L (ref 136–145)
TOTAL PROTEIN: 7 g/dL (ref 6.4–8.3)

## 2013-08-14 LAB — CBC WITH DIFFERENTIAL/PLATELET
BASO%: 1 % (ref 0.0–2.0)
Basophils Absolute: 0.1 10*3/uL (ref 0.0–0.1)
EOS%: 1.6 % (ref 0.0–7.0)
Eosinophils Absolute: 0.1 10*3/uL (ref 0.0–0.5)
HEMATOCRIT: 38 % (ref 34.8–46.6)
HGB: 12.6 g/dL (ref 11.6–15.9)
LYMPH#: 1.9 10*3/uL (ref 0.9–3.3)
LYMPH%: 36.6 % (ref 14.0–49.7)
MCH: 30.1 pg (ref 25.1–34.0)
MCHC: 33.2 g/dL (ref 31.5–36.0)
MCV: 90.8 fL (ref 79.5–101.0)
MONO#: 0.4 10*3/uL (ref 0.1–0.9)
MONO%: 7.5 % (ref 0.0–14.0)
NEUT#: 2.8 10*3/uL (ref 1.5–6.5)
NEUT%: 53.3 % (ref 38.4–76.8)
PLATELETS: 203 10*3/uL (ref 145–400)
RBC: 4.19 10*6/uL (ref 3.70–5.45)
RDW: 13.3 % (ref 11.2–14.5)
WBC: 5.3 10*3/uL (ref 3.9–10.3)

## 2013-08-14 NOTE — Progress Notes (Signed)
OFFICE PROGRESS NOTE  CC  [name],JEFFREY Alexandria Resides, MD [address] [place] [address] Dr. Thea Silversmith Dr. 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  PRIOR THERAPY:  #1 patient is status post lumpectomy with sentinel lymph node biopsy. 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  #2 patient's tumor was sent to Oncotype testing with a recurrence score of 9 putting her at risk for distant recurrence of only 7%. This is very acceptable  #3 patient will first proceed with radiation therapy this will then be followed by antiestrogen therapy consisting of an aromatase inhibitor.patient has completed her radiation as of January 2014.  #4 proceed with Arimidex 1 mg daily starting 05/08/2012. Total of 5 years of therapy is planned.  CURRENT THERAPY: curative intent Arimidex 1 mg daily  INTERVAL HISTORY: Alexandria [name] 73 y.o. female returns for followup visit. Overall patient is doing well. Patient reports that she discontinued the Fosamax and February of this year secondary to leg pain. The pain tended to be worse at night. She also has osteoarthritis, small vessel disease and is on Lipitor however one stopping the Fosamax this particularly pain resolved completely. Reports that she doesn't take calcium secondary to having 3 kidney stones since menopause. She had a bone density performed 09/17/2012 which revealed a T score of -1.0. She was having some significant knee pain to the point where she was having to use either a walker or a cane to ambulate. She is under the care of Dr.Olin and has just finished 3 injections of Euflexxa.  Since completing the injections her knee pain is significantly better she no longer needs the walker or cane to walk. She is tolerating the Arimidex without difficulty. She  has continued to do her self breast exams as well as get her yearly mammograms. Her bilateral diagnostic mammogram and 01/01/2013 revealed no mammographic evidence of malignancy in either breast. The patient is concerned about some nodularity and fullness at the right breast. She found her initial cancer and is concerned as this is a change in her breast exam. She states that the nodularity and fullness has been present for about the past 2 and half months. She is denying any fevers chills night sweats headaches shortness of breath chest pains palpitations no myalgias and arthralgias. She denies any night sweats. She reports that she plans to take in a a [place] brother and sister next semester for an [place] emergent. She is excited about this opportunity. She continues to cope with the death of her husband at the end of October 2014.  Remainder of the 10 point review of systems is negative.  MEDICAL HISTORY: Past Medical History  Diagnosis Date  . Allergy   . Hearing loss     bilateral hearing aids  . Hyperlipidemia   . GERD (gastroesophageal reflux disease)   . COPD (chronic obstructive pulmonary disease)   . 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 problems with balance  . IBS (irritable bowel syndrome)   . History of kidney stones   . 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 Traumatic hemopneumothorax 1982    left  . PVD (peripheral vascular disease)     varicose veins - left worse than right  . Anxiety   . Depression   . Hypertension  under control, has been on med. since age 34  . Spinal stenosis   . Arthritis     left knee, hands, back  . Palpitations   . Abrasion of finger of left hand 12/29/2011    left middle   . Dental crowns present   . Breast cancer 12/2011    right, ER+, PR -, Her 2 -  . Asthma     daily and prn inhalers  . Long term current use of aromatase inhibitor 05/08/2012  . Hx of radiation  therapy 03/12/12 -04/13/12    right breast    ALLERGIES:  is allergic to aspirin; chocolate; coffee bean extract; ibuprofen; oxycodone hcl; penicillins; shrimp; sulfonamide derivatives; augmentin; and eggs or egg-derived products.  MEDICATIONS:  Current Outpatient Prescriptions  Medication Sig Dispense Refill  . amLODipine (NORVASC) 5 MG tablet TAKE 1 TABLET ONCE DAILY.  90 tablet  3  . anastrozole (ARIMIDEX) 1 MG tablet TAKE 1 TABLET ONCE DAILY.  90 tablet  0  . atorvastatin (LIPITOR) 20 MG tablet Twice weekly      . b complex vitamins tablet Take 1 tablet by mouth daily. PLUS FOLIC ACID PLUS VIT. C      . Biotin 5000 MCG TABS Take 1 tablet by mouth daily.       . Cholecalciferol (VITAMIN D-3) 5000 UNITS TABS Take 1,000 Units by mouth daily.       . clopidogrel (PLAVIX) 75 MG tablet TAKE 1 TABLET DAILY.  90 tablet  3  . co-enzyme Q-10 50 MG capsule Take 200 mg by mouth daily.      . cyclobenzaprine (FLEXERIL) 5 MG tablet Take 5 mg by mouth 3 (three) times daily as needed. For muscle spasms      . escitalopram (LEXAPRO) 10 MG tablet TAKE 1/2 TO 1 TABLET AT BEDTIME.  90 tablet  1  . fluticasone (FLONASE) 50 MCG/ACT nasal spray Place 2 sprays into the nose daily.  16 g  11  . Fluticasone-Salmeterol (ADVAIR DISKUS) 100-50 MCG/DOSE AEPB Inhale 1 puff into the lungs 2 (two) times daily at 10 AM and 5 PM.  60 each  11  . glucosamine-chondroitin 500-400 MG tablet Take 1 tablet by mouth 2 (two) times daily. 1500/1200 MG      . hydrochlorothiazide (MICROZIDE) 12.5 MG capsule TAKE 1 CAPSULE IN THE MORNING AS NEEDED.  90 capsule  3  . HYDROcodone-acetaminophen (NORCO/VICODIN) 5-325 MG per tablet       . loratadine (CLARITIN) 10 MG tablet Take 10 mg by mouth daily.        . LUTEIN PO Take 1 tablet by mouth daily.       . meloxicam (MOBIC) 15 MG tablet TAKE (2) TABLETS DAILY.  180 tablet  3  . metoprolol succinate (TOPROL-XL) 25 MG 24 hr tablet TAKE 1 TABLET ONCE DAILY WITH A MEAL.  30 tablet  0  .  pantoprazole (PROTONIX) 40 MG tablet TAKE 1 TABLET EACH DAY.  90 tablet  3  . potassium chloride SA (K-DUR,KLOR-CON) 20 MEQ tablet TAKE 3 TABLETS DAILY.  270 tablet  1  . Probiotic Product (ALIGN PO) Take 1 tablet by mouth daily.       Marland Kitchen selenium 50 MCG TABS Take by mouth daily. $RemoveBefo'200mg'qEYbacWXDaB$  qd      . VENTOLIN HFA 108 (90 BASE) MCG/ACT inhaler INHALE 2 PUFFS INTO THE LUNGS EVERY 6 HOURS AS NEEDED FOR SHORTNESS OF BREATH AND WHEEZING.  18 g  3  . alendronate (FOSAMAX) 70  MG tablet Take 1 tablet (70 mg total) by mouth every 7 (seven) days. Take with a full glass of water on an empty stomach.  12 tablet  6  . hydrochlorothiazide (HYDRODIURIL) 12.5 MG tablet Take 12.5 mg by mouth daily.      . traMADol (ULTRAM) 50 MG tablet Take 1 tablet (50 mg total) by mouth at bedtime.  30 tablet  6   No current facility-administered medications for this visit.    SURGICAL HISTORY:  Past Surgical History  Procedure Laterality Date  . Vein surgery      vein ablation left leg  . Knee surgery  1980s    left  . Orif ankle fracture  1980s    right   . Ankle hardware removal      right  . Tubal ligation  1976  . Cholecystectomy  1990s  . Transobturator sling  07/05/2005  . Cystoscopy  07/05/2005  . Breast biopsy  1976    right  . Breast lumpectomy  2013    right with snbx    REVIEW OF SYSTEMS:  Pertinent items are noted in HPI.   HEALTH MAINTENANCE: Bone Density 09/17/2012:T score -1.0 Bilateral digital diagnostic mammogram performed 01/01/2013 : No mammographic evidence of malignancy in either breast  PHYSICAL EXAMINATION: Blood pressure 146/75, pulse 60, temperature 98.3 F (36.8 C), temperature source Oral, resp. rate 18, height $RemoveBe'5\' 4"'uOAYdrPti$  (1.626 m), weight 148 lb 11.2 oz (67.45 kg), SpO2 98.00%. Body mass index is 25.51 kg/(m^2).  ECOG PERFORMANCE STATUS: 0 - Asymptomatic  PHYSICAL EXAMINATION: General appearance: alert, cooperative, appears stated age and no distress Head: Normocephalic, without obvious  abnormality, atraumatic Neck: no adenopathy, no carotid bruit, no JVD, supple, symmetrical, trachea midline and thyroid not enlarged, symmetric, no tenderness/mass/nodules Lymph nodes: Cervical, supraclavicular, and axillary nodes normal. Resp: clear to auscultation bilaterally Back: symmetric, no curvature. ROM normal. No CVA tenderness. Cardio: regular rate and rhythm, S1, S2 normal, no murmur, click, rub or gallop GI: soft, non-tender; bowel sounds normal; no masses,  no organomegaly Extremities: extremities normal, atraumatic, no cyanosis or edema Neurologic: Alert and oriented X 3, normal strength and tone. Normal symmetric reflexes. Normal coordination and gait Breasts: Right breast reveals well-healed surgical incisions from previous cancer however, between approximately 8:00 and 11:00 the lateral aspect of the breast there is nodularity as well as some fullness going towards the area less than nipple. There is no discrete skin change, nipple inversion or nipple discharge. No palpable on right axillary adenopathy no right upper extremity edema. Left breast is without masses, skin changes, nipple inversion or nipple discharge no palpable left axillary lymphadenopathy no left upper extremity edema.     LABORATORY DATA: Lab Results  Component Value Date   WBC 5.3 08/14/2013   HGB 12.6 08/14/2013   HCT 38.0 08/14/2013   MCV 90.8 08/14/2013   PLT 203 08/14/2013      Chemistry      Component Value Date/Time   NA 142 08/14/2013 1320   NA 141 12/30/2011 1208   K 4.2 08/14/2013 1320   K 3.8 12/30/2011 1208   CL 106 08/01/2012 1021   CL 102 12/30/2011 1208   CO2 23 08/14/2013 1320   CO2 30 12/30/2011 1208   BUN 17.0 08/14/2013 1320   BUN 20 12/30/2011 1208   CREATININE 1.0 08/14/2013 1320   CREATININE 0.86 12/30/2011 1208      Component Value Date/Time   CALCIUM 9.6 08/14/2013 1320   CALCIUM 9.2 12/30/2011 1208  ALKPHOS 61 08/14/2013 1320   ALKPHOS 58 06/04/2012 1039   AST 24 08/14/2013 1320   AST 27  06/04/2012 1039   ALT 16 08/14/2013 1320   ALT 20 06/04/2012 1039   BILITOT 1.53* 08/14/2013 1320   BILITOT 1.7* 06/04/2012 1039     1. A sample was sent to [facility] for oncotype testing. The patient's recurrence score is 9. Those patients who had a recurrence score of 9 had an average rate of distant recurrence of 7%. (JBK:gt, 02/02/12) Enid Cutter MD Pathologist, Electronic Signature ( Signed 02/02/2012) 1. CHROMOGENIC IN-SITU HYBRIDIZATION Interpretation HER-2/NEU BY [facility] - NO AMPLIFICATION OF HER-2 DETECTED. THE RATIO OF HER-2: CEP 17 SIGNALS WAS 1.19. Reference range: Ratio: HER2:CEP17 < 1.8 - gene amplification not observed Ratio: HER2:CEP 17 1.8-2.2 - equivocal result Ratio: HER2:CEP17 > 2.2 - gene amplification observed Enid Cutter MD Pathologist, Electronic Signature ( Signed 01/10/2012) FINAL DIAGNOSIS Diagnosis 1. Breast, lumpectomy, Right - INVASIVE DUCTAL CARCINOMA, GRADE I / III SPANNING 1.6 CM. - DUCTAL CARCINOMA IN SITU, LOW GRADE. 1 of 3 FINAL for Soucy, Shantina L ([id number]) Diagnosis(continued) - THE SURGICAL RESECTION MARGINS ARE NEGATIVE FOR CARCINOMA. - SEE ONCOLOGY TABLE BELOW. 2. Lymph node, sentinel, biopsy, Right - THERE IS NO EVIDENCE OF CARCINOMA IN 1 OF 1 LYMPH NODE (0/1). Microscopic Comment 1. BREAST, INVASIVE TUMOR, WITH LYMPH NODE SAMPLING Specimen, including laterality: Right breast Procedure: Needle localized lumpectomy Grade: I Tubule formation: II Nuclear pleomorphism: II Mitotic: I Tumor size (glass slide measurement): 1.6 cm Margins: Greater than 0.3 cm to all margins (gross measurements) Lymphovascular invasion: Not identified Ductal carcinoma in situ: Present, low grade Extensive intraductal carcinoma: Not identified. Lobular neoplasia: Not identified Tumor focality: Unifocal Treatment effect: N/A Extent of tumor: Confined to breast parenchyma Lymph nodes: # examined: 1 Lymph nodes with metastasis: 0 Breast prognostic  profile: Case [id number] Estrogen receptor: 100%, strong staining intensity Progesterone receptor: 0% Her 2 neu: No amplification was detected, the ratio was 1.14, Her 2 neu by [facility] will be repeated on the current case and the results reported separately. Ki-67: 27% Non-neoplastic breast: Healing biopsy site TNM: pT1c, pN0 Comments: The malignant cells are positive for e-cadherin. (JBK:caf 01/05/12) Enid Cutter MD Pathologist, Electronic Signature (Case signed 01/06/2012) Specimen Gross and Clinical Information Specimen(s) Obtained: 1. Breast, lumpectomy, Right 2. Lymph node, sentinel, biopsy, Right Specimen Clinical Information 1. Right breast cancer  RADIOGRAPHIC STUDIES:  No results found.  ASSESSMENT: 73 year old female with  #1 stage I invasive ductal carcinoma measuring 1.6 cm ER positive PR negative HER-2/neu negative. She is status pos tright breast lumpectomy with sentinel lymph node biopsy. All sentinel nodes were negative for metastatic disease. Patient had Oncotype testing with a recurrence score of only 9 giving her a 7% distance recurrence with antiestrogen therapy only. Patient would not benefit from chemotherapy.  #2 Patient is status post  radiation therapy ( completed January 2014) with the plan of following with an antiestrogen therapy consisting of an aromatase inhibitor for a total of 5 years.   #3 patient is on antiestrogen therapy consisting of curative intent Arimidex 1 mg daily. She fully understands the risks and benefits of therapy. Refill prescriptions will be sent to her pharmacy of record as needed.  #4 New right lateral breast nodularity and fullness. This is concerning in this patient with previous breast cancer. New right lateral breast nodularity and fullness. This is concerning in this patient with previous breast cancer.   #5 bone health on aromatase inhibitor-bone density on  09/17/2012 revealed a T score of -1.0  PLAN:  #1 continue Arimidex  1 mg daily. Refills will be sent to her pharmacy of record as needed  #2 low bone mass/osteopenia: T score was -1.0, patient is to continue weight-bearing exercise and to take vitamin D 1000 units daily.   #3 regarding the new nodularity and fullness of her right breast, the patient will be scheduled for a diagnostic unilateral right mammogram and ultrasound at the soonest available appointment at the [facility]. Patient is in agreement with this plan.   #4 patient will followup with Dr. Lindi Adie in 6 months  Patient was reviewed with Dr.Magrinat who also was in agreement with plan.  All questions were answered. The patient knows to call the clinic with any problems, questions or concerns. We can certainly see the patient much sooner if necessary.  I spent 35 minutes counseling the patient face to face. The total time spent in the appointment was 40 minutes.    Carlton Adam, PA-C  Medical/Oncology [facility] [phone] (Office)

## 2013-08-15 ENCOUNTER — Telehealth: Payer: Self-pay | Admitting: Oncology

## 2013-08-15 ENCOUNTER — Other Ambulatory Visit: Payer: Self-pay

## 2013-08-15 ENCOUNTER — Ambulatory Visit: Payer: Self-pay | Admitting: Oncology

## 2013-08-15 NOTE — Telephone Encounter (Signed)
s.w. pt and advised on Dec appt...pt already awareof Mammo

## 2013-08-16 NOTE — Patient Instructions (Signed)
Continue the Arimidex 1 mg by mouth daily Be sure to take vitamin D 1000 units by mouth daily and continue weight-bearing exercise Keep the appointment for the diagnostic mammogram on your right breast you also have an ultrasound done the same day to further evaluate your area of concern Followup with Dr.Gudena in 6 months or sooner as needed

## 2013-08-17 ENCOUNTER — Other Ambulatory Visit: Payer: Self-pay | Admitting: Cardiology

## 2013-08-23 ENCOUNTER — Ambulatory Visit
Admission: RE | Admit: 2013-08-23 | Discharge: 2013-08-23 | Disposition: A | Discharge status: Home / Self Care | Payer: Medicare Other | Source: Ambulatory Visit | Attending: Physician Assistant | Admitting: Physician Assistant

## 2013-08-23 ENCOUNTER — Encounter (INDEPENDENT_AMBULATORY_CARE_PROVIDER_SITE_OTHER): Payer: Self-pay

## 2013-08-23 DIAGNOSIS — C50219 Malignant neoplasm of upper-inner quadrant of unspecified female breast: Secondary | ICD-10-CM

## 2013-09-20 ENCOUNTER — Ambulatory Visit (INDEPENDENT_AMBULATORY_CARE_PROVIDER_SITE_OTHER): Payer: Medicare Other | Admitting: General Surgery

## 2013-09-20 ENCOUNTER — Encounter (INDEPENDENT_AMBULATORY_CARE_PROVIDER_SITE_OTHER): Payer: Self-pay | Admitting: General Surgery

## 2013-09-20 VITALS — BP 126/78 | HR 75 | Temp 98.4°F | Ht 64.0 in | Wt 150.0 lb

## 2013-09-20 DIAGNOSIS — C50219 Malignant neoplasm of upper-inner quadrant of unspecified female breast: Secondary | ICD-10-CM

## 2013-09-20 DIAGNOSIS — C50211 Malignant neoplasm of upper-inner quadrant of right female breast: Secondary | ICD-10-CM

## 2013-09-20 NOTE — Progress Notes (Signed)
Subjective:     Patient ID: Alexandria [name], female   DOB: [birth year], 73 y.o.   MRN: [id number]  HPI This is a 73 year old female underwent a right breast lumpectomy and sentinel node biopsy in 2013 for a stage I breast cancer that is estrogen receptor positive, progesterone receptor negative, HER-2/neu is not amplified. She underwent a radiation therapy. She is now on arimidex and is tolerating that well. She still has some pain at her surgical site occasionally. She thought she had an area with some asymmetry and underwent evaluation in June with the mammogram and ultrasound were both normal. She is due to get her next routine exam in October. She has no discharge. She is otherwise doing well very well and has remained active and she has a couple [place] exchange students staying with her now also. She is due to followup with the new oncologist Dr Lindi Adie at the [facility] sometime in the fall. She has no issues with her right arm.  Review of Systems  Constitutional: Negative for fever, chills and unexpected weight change.  HENT: Positive for hearing loss. Negative for congestion, sore throat, trouble swallowing and voice change.   Eyes: Negative for visual disturbance.  Respiratory: Positive for wheezing. Negative for cough.   Cardiovascular: Positive for leg swelling. Negative for chest pain and palpitations.  Gastrointestinal: Positive for constipation. Negative for nausea, vomiting, abdominal pain, diarrhea, blood in stool, abdominal distention and anal bleeding.  Genitourinary: Negative for hematuria, vaginal bleeding and difficulty urinating.  Musculoskeletal: Positive for arthralgias.  Skin: Negative for rash and wound.  Neurological: Negative for seizures, syncope and headaches.  Hematological: Negative for adenopathy. Bruises/bleeds easily.  Psychiatric/Behavioral: Negative for confusion.   EXAM:  DIGITAL DIAGNOSTIC RIGHT MAMMOGRAM WITH CAD  ULTRASOUND RIGHT BREAST  COMPARISON:  With priors  ACR Breast Density Category b: There are scattered areas of  fibroglandular density.  FINDINGS:  Surgical clips are present in the deep upper right breast.  Parenchymal pattern appears stable compared to prior post treatment  mammogram dated 12/20/2012. No mass, nonsurgical distortion, or  suspicious microcalcification is identified in the right breast. No  skin thickening is appreciated.  Mammographic images were processed with CAD.  On physical exam, the outer right breast does feel slightly fuller  than the outer left breast, but is soft to palpation. I do not  palpate any thickening or discrete mass. The lumpectomy scar in the  upper central right breast is unremarkable on physical examination.  I do not detect a discrete lump on palpation in the region of the  lumpectomy scar (the patient reports a lump just superior to the  lumpectomy scar).  Ultrasound is performed, showing a normal appearance of lumpectomy  scar in the upper central right breast. No solid or cystic mass or  abnormal shadowing is identified in the upper outer or lower outer  right breast or in the region of the lumpectomy scar to suggest  malignancy.  IMPRESSION:  No evidence of malignancy in the right breast. Stable lumpectomy  changes.  RECOMMENDATION:  Bilateral diagnostic mammogram in October 2015 is recommended.  I have discussed the findings and recommendations with the patient.  Results were also provided in writing at the conclusion of the  visit. If applicable, a reminder letter will be sent to the patient  regarding the next appointment.  BI-RADS CATEGORY 2: Benign.     Objective:   Physical Exam  Vitals reviewed. Constitutional: She appears well-developed and well-nourished.  Pulmonary/Chest: Right breast  exhibits no inverted nipple, no mass, no nipple discharge, no skin change and no tenderness. Left breast exhibits no inverted nipple, no mass, no nipple discharge, no skin change  and no tenderness.    Lymphadenopathy:    She has no cervical adenopathy.    She has no axillary adenopathy.       Right: No supraclavicular adenopathy present.       Left: No supraclavicular adenopathy present.       Assessment:     Stage I breast cancer     Plan:     She has no clinical evidence of recurrence.  She can continue self exams monthly, mm in October and then I can see annually. She will continue aromasin and will f/u with med onc this fall

## 2013-09-20 NOTE — Patient Instructions (Signed)

## 2013-10-09 ENCOUNTER — Other Ambulatory Visit: Payer: Self-pay | Admitting: Oncology

## 2013-10-09 DIAGNOSIS — C50219 Malignant neoplasm of upper-inner quadrant of unspecified female breast: Secondary | ICD-10-CM

## 2013-11-20 ENCOUNTER — Ambulatory Visit (INDEPENDENT_AMBULATORY_CARE_PROVIDER_SITE_OTHER): Payer: Medicare Other | Admitting: Family Medicine

## 2013-11-20 ENCOUNTER — Encounter: Payer: Self-pay | Admitting: Family Medicine

## 2013-11-20 VITALS — BP 120/80 | Temp 98.3°F | Ht 64.25 in | Wt 151.0 lb

## 2013-11-20 DIAGNOSIS — C50219 Malignant neoplasm of upper-inner quadrant of unspecified female breast: Secondary | ICD-10-CM

## 2013-11-20 DIAGNOSIS — E785 Hyperlipidemia, unspecified: Secondary | ICD-10-CM

## 2013-11-20 DIAGNOSIS — I6350 Cerebral infarction due to unspecified occlusion or stenosis of unspecified cerebral artery: Secondary | ICD-10-CM

## 2013-11-20 DIAGNOSIS — K219 Gastro-esophageal reflux disease without esophagitis: Secondary | ICD-10-CM

## 2013-11-20 DIAGNOSIS — I639 Cerebral infarction, unspecified: Secondary | ICD-10-CM

## 2013-11-20 DIAGNOSIS — Z23 Encounter for immunization: Secondary | ICD-10-CM

## 2013-11-20 DIAGNOSIS — J45909 Unspecified asthma, uncomplicated: Secondary | ICD-10-CM

## 2013-11-20 DIAGNOSIS — Z79811 Long term (current) use of aromatase inhibitors: Secondary | ICD-10-CM

## 2013-11-20 DIAGNOSIS — Z923 Personal history of irradiation: Secondary | ICD-10-CM

## 2013-11-20 DIAGNOSIS — J4489 Other specified chronic obstructive pulmonary disease: Secondary | ICD-10-CM

## 2013-11-20 DIAGNOSIS — R279 Unspecified lack of coordination: Secondary | ICD-10-CM

## 2013-11-20 DIAGNOSIS — M4804 Spinal stenosis, thoracic region: Secondary | ICD-10-CM

## 2013-11-20 DIAGNOSIS — R270 Ataxia, unspecified: Secondary | ICD-10-CM

## 2013-11-20 DIAGNOSIS — M549 Dorsalgia, unspecified: Secondary | ICD-10-CM

## 2013-11-20 DIAGNOSIS — J449 Chronic obstructive pulmonary disease, unspecified: Secondary | ICD-10-CM

## 2013-11-20 DIAGNOSIS — IMO0002 Reserved for concepts with insufficient information to code with codable children: Secondary | ICD-10-CM

## 2013-11-20 DIAGNOSIS — C50211 Malignant neoplasm of upper-inner quadrant of right female breast: Secondary | ICD-10-CM

## 2013-11-20 LAB — LIPID PANEL
Cholesterol: 218 mg/dL — ABNORMAL HIGH (ref 0–200)
HDL: 52.9 mg/dL (ref >39.00)
LDL Cholesterol: 138 mg/dL — ABNORMAL HIGH (ref 0–99)
NonHDL: 165.1
Total CHOL/HDL Ratio: 4
Triglycerides: 136 mg/dL (ref 0.0–149.0)
VLDL: 27.2 mg/dL (ref 0.0–40.0)

## 2013-11-20 LAB — POCT URINALYSIS DIPSTICK
BILIRUBIN UA: NEGATIVE
GLUCOSE UA: NEGATIVE
NITRITE UA: NEGATIVE
Protein, UA: NEGATIVE
RBC UA: NEGATIVE
Spec Grav, UA: 1.02
Urobilinogen, UA: 1
pH, UA: 5.5

## 2013-11-20 LAB — TSH: TSH: 1.17 u[IU]/mL (ref 0.35–4.50)

## 2013-11-20 MED ORDER — ATORVASTATIN CALCIUM 20 MG PO TABS: 20.0000 mg | ORAL_TABLET | Freq: Once | ORAL | Status: DC

## 2013-11-20 MED ORDER — FLUTICASONE-SALMETEROL 100-50 MCG/DOSE IN AEPB: 1.0000 | INHALATION_SPRAY | Freq: Two times a day (BID) | RESPIRATORY_TRACT | Status: DC

## 2013-11-20 MED ORDER — PANTOPRAZOLE SODIUM 40 MG PO TBEC: DELAYED_RELEASE_TABLET | ORAL | Status: DC

## 2013-11-20 MED ORDER — FLUTICASONE PROPIONATE 50 MCG/ACT NA SUSP: 2.0000 | Freq: Every day | NASAL | Status: DC

## 2013-11-20 MED ORDER — POTASSIUM CHLORIDE CRYS ER 20 MEQ PO TBCR: EXTENDED_RELEASE_TABLET | ORAL | Status: DC

## 2013-11-20 MED ORDER — CLOPIDOGREL BISULFATE 75 MG PO TABS: ORAL_TABLET | ORAL | Status: DC

## 2013-11-20 MED ORDER — ALBUTEROL SULFATE HFA 108 (90 BASE) MCG/ACT IN AERS: INHALATION_SPRAY | RESPIRATORY_TRACT | Status: DC

## 2013-11-20 MED ORDER — ESCITALOPRAM OXALATE 10 MG PO TABS: ORAL_TABLET | ORAL | Status: DC

## 2013-11-20 MED ORDER — HYDROCHLOROTHIAZIDE 12.5 MG PO CAPS: ORAL_CAPSULE | ORAL | Status: DC

## 2013-11-20 MED ORDER — METOPROLOL SUCCINATE ER 25 MG PO TB24: ORAL_TABLET | ORAL | Status: DC

## 2013-11-20 MED ORDER — AMLODIPINE BESYLATE 5 MG PO TABS: ORAL_TABLET | ORAL | Status: DC

## 2013-11-20 MED ORDER — MELOXICAM 15 MG PO TABS: ORAL_TABLET | ORAL | Status: DC

## 2013-11-20 NOTE — Progress Notes (Signed)
Subjective:    Patient ID: Alexandria [name], female    DOB: 01-06-1941, 73 y.o.   MRN: [id number]  HPI Alexandria [name], is a 73 year old female,,,,,,, her husband Francee Piccolo died last frank from complications of lung cancer,,,,,,,, retired Marine scientist,,,,,,,,, X. Smoker,,,,,, who comes in today for evaluation of multiple issues  She takes Norvasc 10 mg daily for hypertension BP 120/80  She's had breast cancer hand surgery and postop radiation 2 years ago followed by Dr. Durward Fortes and at the cancer center. She's on Lexapro 10 mg daily because of a history of depression  She takes Advair 100/50 one puff twice a day for chronic asthma and COPD doing well we'll needs albuterol when necessary  Takes Protonix for reflux  She also has allergic rhinitis osteoarthritis hyperlipidemia and is on a chronic blood thinner Plavix because of a history of a stroke a couple years ago.  She states overall she seems to be doing well. She did 1 mission trip to [place] and is involved with other curable projects.  She states she's had low back pain for 6 months. She fell 6 months ago. Did not see any medical help at that time however now her back pain seems to be constant and getting worse.  Vaccinations updated by Apolonio Schneiders   Review of Systems  Constitutional: Negative.   HENT: Negative.   Eyes: Negative.   Respiratory: Negative.   Cardiovascular: Negative.   Gastrointestinal: Negative.   Genitourinary: Negative.   Musculoskeletal: Negative.   Neurological: Negative.   Psychiatric/Behavioral: Negative.        Objective:   Physical Exam  Nursing note and vitals reviewed. Constitutional: She appears well-developed and well-nourished.  HENT:  Head: Normocephalic and atraumatic.  Right Ear: External ear normal.  Left Ear: External ear normal.  Nose: Nose normal.  Mouth/Throat: Oropharynx is clear and moist.  Eyes: EOM are normal. Pupils are equal, round, and reactive to light.  Neck: Normal range of  motion. Neck supple. No JVD present. No tracheal deviation present. No thyromegaly present.  Cardiovascular: Normal rate, regular rhythm, normal heart sounds and intact distal pulses.  Exam reveals no gallop and no friction rub.   No murmur heard. No carotid no earache bruits peripheral pulses 1+ and symmetrical  Pulmonary/Chest: Effort normal and breath sounds normal. No stridor.  Abdominal: Soft. Bowel sounds are normal. She exhibits no distension and no mass. There is no tenderness. There is no rebound.  Genitourinary:  Bilateral breast exam shows the left breast to be normal except for a cystic lesion at 3:00. It's walnut size soft rubbery movable and has been present previously.  Right breast shows scar at the 12:00 position from previous lumpectomy. Also has a small rubbery movable lesion at the 12:00 position but she is also aware of. She had a mammogram and diagnostic ultrasound in June  Musculoskeletal: Normal range of motion.  Lymphadenopathy:    She has no cervical adenopathy.  Neurological: She is alert. She has normal reflexes. No cranial nerve deficit. She exhibits normal muscle tone. Coordination normal.  Skin: Skin is warm and dry.  Total body skin exam normal  Psychiatric: She has a normal mood and affect. Her behavior is normal. Judgment and thought content normal.          Assessment & Plan:  Hypertension at goal continue current therapy  Hyperlipidemia continue Lipitor check labs  History of stroke asymptomatic continue Plavix  History of depression continue Lexapro 10 mg daily  History of COPD  and asthma continue Advair albuterol when necessary  Reflux esophagitis continue Protonix  Back pain x6 months orthopedic evaluation by Dr. Durward Fortes

## 2013-11-20 NOTE — Progress Notes (Signed)
Pre visit review using our clinic review tool, if applicable. No additional management support is needed unless otherwise documented below in the visit note. 

## 2013-11-20 NOTE — Patient Instructions (Signed)
Continue your current medications  Remember to walk 30 minutes daily  Consult with your orthopedist concerning her chronic back pain..........Marland Kitchen since you have early seen the folks at [facility] orthopedics I would recommend you see one of them.  Labs today  We will call you the report ASAP

## 2013-11-28 ENCOUNTER — Other Ambulatory Visit: Payer: Self-pay | Admitting: Family Medicine

## 2013-11-28 ENCOUNTER — Other Ambulatory Visit: Payer: Self-pay | Admitting: Hematology and Oncology

## 2013-11-28 DIAGNOSIS — Z853 Personal history of malignant neoplasm of breast: Secondary | ICD-10-CM

## 2013-11-28 DIAGNOSIS — N6001 Solitary cyst of right breast: Secondary | ICD-10-CM

## 2013-11-28 DIAGNOSIS — N6002 Solitary cyst of left breast: Secondary | ICD-10-CM

## 2013-12-23 ENCOUNTER — Ambulatory Visit
Admission: RE | Admit: 2013-12-23 | Discharge: 2013-12-23 | Disposition: A | Discharge status: Home / Self Care | Payer: Medicare Other | Source: Ambulatory Visit | Attending: Family Medicine | Admitting: Family Medicine

## 2013-12-23 DIAGNOSIS — N6002 Solitary cyst of left breast: Secondary | ICD-10-CM

## 2013-12-23 DIAGNOSIS — N6001 Solitary cyst of right breast: Secondary | ICD-10-CM

## 2013-12-23 DIAGNOSIS — Z853 Personal history of malignant neoplasm of breast: Secondary | ICD-10-CM

## 2013-12-27 ENCOUNTER — Telehealth: Payer: Self-pay

## 2013-12-27 NOTE — Telephone Encounter (Signed)
Per Dr Lindi Adie - mammo and ultrasound normal.  Pt would like MRI every other year.  Last MRI 2013 - pt would like MRI before appt in Dec.

## 2013-12-30 DIAGNOSIS — C50219 Malignant neoplasm of upper-inner quadrant of unspecified female breast: Secondary | ICD-10-CM

## 2013-12-31 ENCOUNTER — Other Ambulatory Visit: Payer: Self-pay | Admitting: Hematology and Oncology

## 2013-12-31 ENCOUNTER — Telehealth: Payer: Self-pay | Admitting: *Deleted

## 2013-12-31 NOTE — Telephone Encounter (Signed)
Order for breast MRI signed by Dr. Lindi Adie and faxed. Original sent to HIM to scanned.

## 2014-01-23 ENCOUNTER — Encounter: Payer: Self-pay | Admitting: Neurology

## 2014-01-23 ENCOUNTER — Ambulatory Visit (INDEPENDENT_AMBULATORY_CARE_PROVIDER_SITE_OTHER): Payer: Medicare Other | Admitting: Neurology

## 2014-01-23 VITALS — BP 135/75 | HR 64 | Temp 99.2°F | Ht 64.0 in | Wt 155.0 lb

## 2014-01-23 DIAGNOSIS — M545 Low back pain, unspecified: Secondary | ICD-10-CM

## 2014-01-23 DIAGNOSIS — R269 Unspecified abnormalities of gait and mobility: Secondary | ICD-10-CM

## 2014-01-23 DIAGNOSIS — M25562 Pain in left knee: Secondary | ICD-10-CM

## 2014-01-23 NOTE — Patient Instructions (Signed)
Continue physical therapy.  Try to stay well hydrated and use your quad cane or your walker.  Have your children observe your driving.  Follow up with orthopedics for your L knee and Dr. Nelva Bush for your lower back.

## 2014-01-23 NOTE — Progress Notes (Signed)
Subjective:    Patient ID: Alexandria [name] is a 73 y.o. female.  HPI     Interim history:   Alexandria [name] is a very pleasant 73 year old Right-handed female, with an underlying history of breast cancer, HTN, HLP, chronic lung disease, Depression, anxiety, allergies, irritable bowel syndrome, who presents for followup consultation of gait disorder. The patient is unaccompanied today. I last saw her on 08/03/2012, at which time I felt her gait disturbance and recurrent falls had improved. She had noted benefit from physical therapy. She was advised to continue with exercises. She was advised to keep well hydrated and change positions slowly. I suggest as needed follow up. She requested a follow-up for worsening gait. She has fallen 3 times this year. She hurt her L knee and went back to orthopedics and had injections into the L knee 3 times. She then had exacerbation of her LBP and saw Dr. Nelva Bush who suggested and MRI and PT and suggested potential injections into the back, but she opted to pursue only PT. She has never had a L spine MRI.  She has a b/l breast MRI pending for 02/10/14. She walks with a quad cane. She has a rolling walker as well.  She takes hydrodone 1/2 pill at night as needed and Mobic once daily.  She has had some slowness in thinking and some issues driving, some problems with concentration.   I first met her on 05/07/2012, at which time I suggested restarting physical therapy and completion of her TIA-stroke workup with cardiology evaluation. She had an exercise stress test on 06/04/2012 but did not have enough exercise tolerance and therefore was scheduled for a nuclear stress test which was negative on in April 2014. She also had an echocardiogram on 06/06/12, which was normal.  At the time of her first visit she reported a progressive problem with her gait stability since January 2013. She fell and hit her head in January of last year. She has had multiple falls. She was evaluated by  Dr. Neena Rhymes at [facility] in 6/13 and 8/13 and has undergone brain MRI and MRA brain and neck. She has had PT evaluation and has gone through PT which helped. Dr. Jacelyn Grip started her on Plavix as she cannot take ASA and wanted her to get a echocardiogram as well. 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 She was diagnosed with R breast cancer last year and had a lumpectomy in 10/13 and has had radiation Rx. She had an invasive ductal stage I carcinoma. She had vestibular PT in the summer which helped. They said to continue the exercises, but because of the cancer Dx and treatment she had a set back.   Her atenolol was changed to metoprolol as she was having bradycardia on the atenolol and now feels better with the metoprolol. She has also been started on Fosamax and she was found to have low bone density. She has joined [employer] and is also going to start breast cancer rehabilitation. She overall reports improvement in her balance and gait after a recent round of physical therapy.    Her Past Medical History Is Significant For: Past Medical History  Diagnosis Date  . Allergy   . Hearing loss     bilateral hearing aids  . Hyperlipidemia   . GERD (gastroesophageal reflux disease)   . COPD (chronic obstructive pulmonary disease)   . 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 problems with balance  . IBS (irritable bowel syndrome)   . History of kidney stones   . 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 Traumatic hemopneumothorax 1982    left  . PVD (peripheral vascular disease)     varicose veins - left worse than right  . Anxiety   . Depression   . Hypertension     under control, has been on med. since age 45  . Spinal stenosis   . Arthritis     left knee, hands, back  . Palpitations   . Abrasion of finger of left hand 12/29/2011    left  middle   . Dental crowns present   . Breast cancer 12/2011    right, ER+, PR -, Her 2 -  . Asthma     daily and prn inhalers  . Long term current use of aromatase inhibitor 05/08/2012  . Hx of radiation therapy 03/12/12 -04/13/12    right breast    Her Past Surgical History Is Significant For: Past Surgical History  Procedure Laterality Date  . Vein surgery      vein ablation left leg  . Knee surgery  1980s    left  . Orif ankle fracture  1980s    right   . Ankle hardware removal      right  . Tubal ligation  1976  . Cholecystectomy  1990s  . Transobturator sling  07/05/2005  . Cystoscopy  07/05/2005  . Breast biopsy  1976    right  . Breast lumpectomy  2013    right with snbx    Her Family History Is Significant For: Family History  Problem Relation Age of Onset  . Cancer Maternal Aunt     breast  . Cancer Paternal Aunt     Multiple Myeloma  . Stomach cancer Paternal Uncle   . Breast cancer Paternal Uncle   . Breast cancer Cousin     early 92s  . Breast cancer Paternal Aunt     late 40s-60s  . Heart attack Father     Died age 61  . Heart attack Paternal Grandfather   . Breast cancer Paternal Aunt     bilateral breast cancer  . Dementia Paternal Aunt   . Congestive Heart Failure Paternal Uncle     Ischemic  . Congestive Heart Failure Paternal Uncle     Ischemic    Her Social History Is Significant For: History   Social History  . Marital Status: Widowed    Spouse Name: N/A    Number of Children: N/A  . Years of Education: N/A   Social History Main Topics  . Smoking status: Former Smoker -- 0.50 packs/day for 15 years    Types: Cigarettes    Quit date: 08/28/1993  . Smokeless tobacco: Never Used  . Alcohol Use: No  . Drug Use: No  . Sexual Activity: Not Currently   Other Topics Concern  . None   Social History Narrative   Cares for her husband who is not in good health.      Her Allergies Are:  Allergies  Allergen Reactions  . Aspirin  Hives  . Chocolate Hives and Other (See Comments)    RUNNY NOSE   . Coffee Bean Extract Hives and Other (See Comments)    RUNNY NOSE   . Ibuprofen Hives and Other (See Comments)  RUNNY NOSE  . Oxycodone Hcl Hives and Nausea And Vomiting  . Penicillins Hives  . Shrimp [Shellfish Allergy] Hives and Other (See Comments)    RUNNY NOSE   . Sulfonamide Derivatives Hives  . Augmentin [Amoxicillin-Pot Clavulanate] Itching and Rash  . Eggs Or Egg-Derived Products Nausea Only  :   Her Current Medications Are:  Outpatient Encounter Prescriptions as of 01/23/2014  Medication Sig  . albuterol (VENTOLIN HFA) 108 (90 BASE) MCG/ACT inhaler INHALE 2 PUFFS INTO THE LUNGS EVERY 6 HOURS AS NEEDED FOR SHORTNESS OF BREATH AND WHEEZING.  Marland Kitchen amLODipine (NORVASC) 5 MG tablet TAKE 1 TABLET ONCE DAILY.  Marland Kitchen anastrozole (ARIMIDEX) 1 MG tablet TAKE 1 TABLET ONCE DAILY.  Marland Kitchen atorvastatin (LIPITOR) 20 MG tablet Take 1 tablet (20 mg total) by mouth once. Twice weekly  . b complex vitamins tablet Take 1 tablet by mouth daily. PLUS FOLIC ACID PLUS VIT. C  . Biotin 5000 MCG TABS Take 1 tablet by mouth daily.   . Cholecalciferol (VITAMIN D-3) 5000 UNITS TABS Take 1,000 Units by mouth daily.   . clopidogrel (PLAVIX) 75 MG tablet TAKE 1 TABLET DAILY.  Marland Kitchen co-enzyme Q-10 50 MG capsule Take 200 mg by mouth daily.  . cyclobenzaprine (FLEXERIL) 5 MG tablet Take 5 mg by mouth 3 (three) times daily as needed. For muscle spasms  . escitalopram (LEXAPRO) 10 MG tablet TAKE 1/2 TO 1 TABLET AT BEDTIME.  . fluticasone (FLONASE) 50 MCG/ACT nasal spray Place 2 sprays into both nostrils daily.  . Fluticasone-Salmeterol (ADVAIR DISKUS) 100-50 MCG/DOSE AEPB Inhale 1 puff into the lungs 2 (two) times daily at 10 AM and 5 PM.  . glucosamine-chondroitin 500-400 MG tablet Take 1 tablet by mouth 2 (two) times daily. 1500/1200 MG  . hydrochlorothiazide (MICROZIDE) 12.5 MG capsule TAKE 1 CAPSULE IN THE MORNING AS NEEDED.  Marland Kitchen  HYDROcodone-acetaminophen (NORCO/VICODIN) 5-325 MG per tablet   . loratadine (CLARITIN) 10 MG tablet Take 10 mg by mouth daily.    . LUTEIN PO Take 1 tablet by mouth daily.   . meloxicam (MOBIC) 15 MG tablet TAKE (2) TABLETS DAILY.  . metoprolol succinate (TOPROL-XL) 25 MG 24 hr tablet TAKE 1 TABLET ONCE DAILY WITH A MEAL.  . pantoprazole (PROTONIX) 40 MG tablet TAKE 1 TABLET EACH DAY.  Marland Kitchen potassium chloride SA (K-DUR,KLOR-CON) 20 MEQ tablet TAKE 3 TABLETS DAILY.  . Probiotic Product (ALIGN PO) Take 1 tablet by mouth daily.   Marland Kitchen selenium 50 MCG TABS Take by mouth daily. 220m qd  . traMADol (ULTRAM) 50 MG tablet Take 1 tablet (50 mg total) by mouth at bedtime.  . [DISCONTINUED] predniSONE (DELTASONE) 10 MG tablet 10 mg daily.  :  Review of Systems:  Out of a complete 14 point review of systems, all are reviewed and negative with the exception of these symptoms as listed below:   Review of Systems  Constitutional: Positive for activity change and fatigue.  HENT: Positive for hearing loss.        Ringing in ears,  Runny nose  Eyes: Positive for discharge, redness and itching.       Light sensitivity  Respiratory: Positive for cough and shortness of breath.   Cardiovascular: Positive for leg swelling.  Skin:       Itching, moles  Allergic/Immunologic: Positive for environmental allergies.  Hematological: Bruises/bleeds easily.    Objective:  Neurologic Exam  Physical Exam Physical Examination:   Filed Vitals:   01/23/14 1255  BP: 135/75  Pulse: 64  Temp:  99.2 F (37.3 C)    General Examination: The patient is a very pleasant 73 y.o. female in no acute distress. She appears well-developed and well-nourished and well groomed. She is in good spirits today.   HEENT: Normocephalic, atraumatic, pupils are equal, round and reactive to light and accommodation. Extraocular tracking is good without nystagmus noted. Hearing is grossly intact. Neck is supple with full range of motion and  she does not have any symptoms of vertigo upon sudden changes in neck position. Face is symmetric with normal facial animation and speech is clear. Oropharynx exam reveals a fairly patent airway and mild to moderate mouth dryness. Tongue protrudes centrally and palate elevates symmetrically. Chest is clear to auscultation without wheezing or rhonchi noted. Heart sounds are normal without murmurs, rubs or gallops noted. Abdomen is soft, nontender with normal bowel sounds appreciated. She has varicose and spider veins throughout her legs, there is nonpitting swelling around the ankle on the left side. Her left leg is larger in caliber and this is chronic per patient. Neurologically: Mental status: The patient is awake, alert and oriented in all 4 spheres. Her memory, attention, language and knowledge are appropriate. She is able to give a concise history. No word finding difficulties or slowness in thinking is noted. Cranial nerves are as described under HEENT exam: In addition she has normal shoulder shrug. Motor exam: Normal bulk, strength and tone is noted. She has no drift and no tremor or rebound. On Romberg testing she has no significant swaying and no corrective steps. Cerebellar testing shows no dysmetria or intention tremor or truncal ataxia. Sensory exam is intact to light touch, pinprick, temperature and vibration sense in the upper and lower extremities. Her posture is age-appropriate. She walks with slightly insecurely without the cane and better with a cane. She has a limp on the left side secondary to left knee pain reported. She turns in 3 steps. Tandem walk is not tested today, due to left knee pain.               Assessment and Plan:   In summary, Alexandria [name] is a very pleasant 73 year old female with a history of Gait disturbance and falls, who presents for follow-up consultation of worsening balance. She has had a fall with injury to the left knee. She had injection treatment to the  left knee under orthopedics. She may qualify for additional injections next month. She is in physical therapy and has benefited from it. She also saw Dr. Nelva Bush for low back pain and he suggested doing an MRI which she opted out of. I suggested that she consider seeing Dr. Nelva Bush for low back pain again. As far as her driving, I suggested that she have one of her children sit with her to observe her driving. It may be prudent for her not to drive long distances, and I suggested she consider cutting back on driving and staying on local roads and daytime driving only. She is advised that her gait disorder is fairly stable from my end of things but I do see a limp on the left. I think she is going to improve with improved left knee pain. I suggested she use her cane or walker at all times for safety. She is advised to change positions slowly and keep well hydrated. I will see her back as needed.

## 2014-02-10 ENCOUNTER — Ambulatory Visit
Admission: RE | Admit: 2014-02-10 | Discharge: 2014-02-10 | Disposition: A | Discharge status: Home / Self Care | Payer: Medicare Other | Source: Ambulatory Visit | Attending: Hematology and Oncology | Admitting: Hematology and Oncology

## 2014-02-10 DIAGNOSIS — C50219 Malignant neoplasm of upper-inner quadrant of unspecified female breast: Secondary | ICD-10-CM

## 2014-02-10 MED ORDER — GADOBENATE DIMEGLUMINE 529 MG/ML IV SOLN
14.0000 mL | Freq: Once | INTRAVENOUS | Status: AC | PRN
  Administered 2014-02-10: 14 mL via INTRAVENOUS

## 2014-02-14 ENCOUNTER — Telehealth: Payer: Self-pay | Admitting: Hematology and Oncology

## 2014-02-14 ENCOUNTER — Ambulatory Visit (HOSPITAL_BASED_OUTPATIENT_CLINIC_OR_DEPARTMENT_OTHER): Payer: Medicare Other | Admitting: Hematology and Oncology

## 2014-02-14 ENCOUNTER — Other Ambulatory Visit: Payer: Self-pay | Admitting: Hematology and Oncology

## 2014-02-14 ENCOUNTER — Other Ambulatory Visit (HOSPITAL_BASED_OUTPATIENT_CLINIC_OR_DEPARTMENT_OTHER): Payer: Medicare Other

## 2014-02-14 VITALS — BP 136/81 | HR 64 | Temp 97.8°F | Resp 18 | Ht 64.0 in | Wt 154.0 lb

## 2014-02-14 DIAGNOSIS — M8580 Other specified disorders of bone density and structure, unspecified site: Secondary | ICD-10-CM

## 2014-02-14 DIAGNOSIS — C50219 Malignant neoplasm of upper-inner quadrant of unspecified female breast: Secondary | ICD-10-CM

## 2014-02-14 DIAGNOSIS — C50211 Malignant neoplasm of upper-inner quadrant of right female breast: Secondary | ICD-10-CM

## 2014-02-14 DIAGNOSIS — Z170 Estrogen receptor positive status [ER+]: Secondary | ICD-10-CM

## 2014-02-14 LAB — CBC WITH DIFFERENTIAL/PLATELET
BASO%: 1.1 % (ref 0.0–2.0)
Basophils Absolute: 0.1 10*3/uL (ref 0.0–0.1)
EOS ABS: 0.1 10*3/uL (ref 0.0–0.5)
EOS%: 1.6 % (ref 0.0–7.0)
HCT: 38 % (ref 34.8–46.6)
HGB: 12.5 g/dL (ref 11.6–15.9)
LYMPH#: 1.5 10*3/uL (ref 0.9–3.3)
LYMPH%: 27.8 % (ref 14.0–49.7)
MCH: 29.6 pg (ref 25.1–34.0)
MCHC: 32.9 g/dL (ref 31.5–36.0)
MCV: 89.8 fL (ref 79.5–101.0)
MONO#: 0.6 10*3/uL (ref 0.1–0.9)
MONO%: 11 % (ref 0.0–14.0)
NEUT%: 58.5 % (ref 38.4–76.8)
NEUTROS ABS: 3.2 10*3/uL (ref 1.5–6.5)
NRBC: 0 % (ref 0–0)
Platelets: 195 10*3/uL (ref 145–400)
RBC: 4.23 10*6/uL (ref 3.70–5.45)
RDW: 13.5 % (ref 11.2–14.5)
WBC: 5.5 10*3/uL (ref 3.9–10.3)

## 2014-02-14 LAB — COMPREHENSIVE METABOLIC PANEL (CC13)
ALBUMIN: 4 g/dL (ref 3.5–5.0)
ALT: 17 U/L (ref 0–55)
ANION GAP: 10 meq/L (ref 3–11)
AST: 24 U/L (ref 5–34)
Alkaline Phosphatase: 79 U/L (ref 40–150)
BUN: 16.1 mg/dL (ref 7.0–26.0)
CALCIUM: 10 mg/dL (ref 8.4–10.4)
CHLORIDE: 106 meq/L (ref 98–109)
CO2: 27 meq/L (ref 22–29)
Creatinine: 0.9 mg/dL (ref 0.6–1.1)
EGFR: 61 mL/min/{1.73_m2} — ABNORMAL LOW (ref >90)
Glucose: 93 mg/dl (ref 70–140)
POTASSIUM: 4.6 meq/L (ref 3.5–5.1)
SODIUM: 143 meq/L (ref 136–145)
Total Bilirubin: 0.94 mg/dL (ref 0.20–1.20)
Total Protein: 7 g/dL (ref 6.4–8.3)

## 2014-02-14 NOTE — Progress Notes (Signed)
Patient Care Team: Dorena Cookey, MD as PCP - General  DIAGNOSIS: Breast cancer of upper-inner quadrant of right female breast   Staging form: Breast, AJCC 7th Edition     Clinical stage from 12/28/2011: Stage IIA (T2, N0, cM0) - Unsigned     Pathologic: No stage assigned - Unsigned   SUMMARY OF ONCOLOGIC HISTORY:   Breast cancer of upper-inner quadrant of right female breast   01/04/2012 Surgery Right breast lumpectomy and a 1.6 cm IDC with negative margins, 1 SLN negative grade 1 with low-grade DCIS, ER 100%, PR 0%, HER-2 negative ratio 1.14, Ki-67 27% T1 C. N0 M0 stage IA Oncotype DX score 9 7%ROR   02/28/2012 - 04/04/2012 Radiation Therapy Adjuvant radiation therapy   05/08/2012 -  Anti-estrogen oral therapy Arimidex 1 mg daily  5 years is the treatment plan    CHIEF COMPLIANT: followup of breast cancer  INTERVAL HISTORY: Alexandria [name] is a 73 year old Caucasian lady who is a retired Marine scientist with above-mentioned history of stage I breast cancer treated with lumpectomy and radiation and is currently on Arimidex since February 2014. She is been tolerating Arimidex extremely well without any major problems or concerns. She was put on Fosamax for osteopenia but after taking for 8 months she quit taking it because of pains in the legs. 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  REVIEW OF SYSTEMS:   Constitutional: Denies fevers, chills or abnormal weight loss Eyes: Denies blurriness of vision Ears, nose, mouth, throat, and face: Denies mucositis or sore throat Respiratory: Denies cough, dyspnea or wheezes Cardiovascular: Denies palpitation, chest discomfort or lower extremity swelling Gastrointestinal:  Denies nausea, heartburn or change in bowel habits Skin: Denies abnormal skin rashes Lymphatics: Denies new lymphadenopathy or easy bruising Neurological:Denies numbness, tingling or new  weaknesses Behavioral/Psych: Mood is stable, no new changes  Breast:  denies any pain or lumps or nodules in either breasts All other systems were reviewed with the patient and are negative.  I have reviewed the past medical history, past surgical history, social history and family history with the patient and they are unchanged from previous note.  ALLERGIES:  is allergic to aspirin; chocolate; coffee bean extract; ibuprofen; oxycodone hcl; penicillins; shrimp; sulfonamide derivatives; augmentin; and eggs or egg-derived products.  MEDICATIONS:  Current Outpatient Prescriptions  Medication Sig Dispense Refill  . Acetaminophen (TYLENOL ARTHRITIS EXT RELIEF PO) Take 650 mg by mouth daily.    Marland Kitchen albuterol (VENTOLIN HFA) 108 (90 BASE) MCG/ACT inhaler INHALE 2 PUFFS INTO THE LUNGS EVERY 6 HOURS AS NEEDED FOR SHORTNESS OF BREATH AND WHEEZING. 18 g 3  . amLODipine (NORVASC) 5 MG tablet TAKE 1 TABLET ONCE DAILY. 90 tablet 3  . anastrozole (ARIMIDEX) 1 MG tablet TAKE 1 TABLET ONCE DAILY. 90 tablet 1  . atenolol-chlorthalidone (TENORETIC) 50-25 MG per tablet Take 1 tablet by mouth daily.    Marland Kitchen atorvastatin (LIPITOR) 20 MG tablet Take 1 tablet (20 mg total) by mouth once. Twice weekly 50 tablet 3  . b complex vitamins tablet Take 1 tablet by mouth daily. PLUS FOLIC ACID PLUS VIT. C    . Cholecalciferol (VITAMIN D-3) 5000 UNITS TABS Take 50,000 Units by mouth daily.     . clopidogrel (PLAVIX) 75 MG tablet TAKE 1 TABLET DAILY. 90 tablet 3  . co-enzyme Q-10 50 MG capsule Take 200 mg by mouth daily.    Marland Kitchen  cyclobenzaprine (FLEXERIL) 5 MG tablet Take 5 mg by mouth 3 (three) times daily as needed. For muscle spasms    . escitalopram (LEXAPRO) 10 MG tablet TAKE 1/2 TO 1 TABLET AT BEDTIME. 90 tablet 3  . fluticasone (FLONASE) 50 MCG/ACT nasal spray Place 2 sprays into both nostrils daily. 16 g 11  . Fluticasone-Salmeterol (ADVAIR DISKUS) 100-50 MCG/DOSE AEPB Inhale 1 puff into the lungs 2 (two) times daily at 10  AM and 5 PM. 60 each 11  . glucosamine-chondroitin 500-400 MG tablet Take 1 tablet by mouth 2 (two) times daily. 1500/1200 MG    . hydrochlorothiazide (MICROZIDE) 12.5 MG capsule TAKE 1 CAPSULE IN THE MORNING AS NEEDED. 90 capsule 3  . HYDROcodone-acetaminophen (NORCO/VICODIN) 5-325 MG per tablet     . loratadine (CLARITIN) 10 MG tablet Take 10 mg by mouth daily.      . LUTEIN PO Take 1 tablet by mouth daily.     . meloxicam (MOBIC) 15 MG tablet TAKE (2) TABLETS DAILY. 180 tablet 3  . metoprolol succinate (TOPROL-XL) 25 MG 24 hr tablet TAKE 1 TABLET ONCE DAILY WITH A MEAL. 100 tablet 3  . ondansetron (ZOFRAN) 4 MG tablet Take 4 mg by mouth every 8 (eight) hours as needed for nausea or vomiting.    . pantoprazole (PROTONIX) 40 MG tablet TAKE 1 TABLET EACH DAY. 90 tablet 3  . potassium chloride SA (K-DUR,KLOR-CON) 20 MEQ tablet TAKE 3 TABLETS DAILY. 270 tablet 3  . Probiotic Product (ALIGN PO) Take 1 tablet by mouth daily.     Marland Kitchen selenium 50 MCG TABS Take by mouth daily. $RemoveBefo'200mg'arjDUFsVYMl$  qd    . traMADol (ULTRAM) 50 MG tablet Take 1 tablet (50 mg total) by mouth at bedtime. 30 tablet 6   No current facility-administered medications for this visit.    PHYSICAL EXAMINATION: ECOG PERFORMANCE STATUS: 0 - Asymptomatic  Filed Vitals:   02/14/14 0926  BP: 136/81  Pulse: 64  Temp: 97.8 F (36.6 C)  Resp: 18   Filed Weights   02/14/14 0926  Weight: 154 lb (69.854 kg)    GENERAL:alert, no distress and comfortable SKIN: skin color, texture, turgor are normal, no rashes or significant lesions EYES: normal, Conjunctiva are pink and non-injected, sclera clear OROPHARYNX:no exudate, no erythema and lips, buccal mucosa, and tongue normal  NECK: supple, thyroid normal size, non-tender, without nodularity LYMPH:  no palpable lymphadenopathy in the cervical, axillary or inguinal LUNGS: clear to auscultation and percussion with normal breathing effort HEART: regular rate & rhythm and no murmurs and no lower  extremity edema ABDOMEN:abdomen soft, non-tender and normal bowel sounds Musculoskeletal:no cyanosis of digits and no clubbing  NEURO: alert & oriented x 3 with fluent speech, no focal motor/sensory deficits BREAST: No palpable masses or nodules in either right or left breasts. No palpable axillary supraclavicular or infraclavicular adenopathy no breast tenderness or nipple discharge.   LABORATORY DATA:  I have reviewed the data as listed   Chemistry      Component Value Date/Time   NA 142 08/14/2013 1320   NA 141 12/30/2011 1208   K 4.2 08/14/2013 1320   K 3.8 12/30/2011 1208   CL 106 08/01/2012 1021   CL 102 12/30/2011 1208   CO2 23 08/14/2013 1320   CO2 30 12/30/2011 1208   BUN 17.0 08/14/2013 1320   BUN 20 12/30/2011 1208   CREATININE 1.0 08/14/2013 1320   CREATININE 0.86 12/30/2011 1208      Component Value Date/Time   CALCIUM  9.6 08/14/2013 1320   CALCIUM 9.2 12/30/2011 1208   ALKPHOS 61 08/14/2013 1320   ALKPHOS 58 06/04/2012 1039   AST 24 08/14/2013 1320   AST 27 06/04/2012 1039   ALT 16 08/14/2013 1320   ALT 20 06/04/2012 1039   BILITOT 1.53* 08/14/2013 1320   BILITOT 1.7* 06/04/2012 1039       Lab Results  Component Value Date   WBC 5.3 08/14/2013   HGB 12.6 08/14/2013   HCT 38.0 08/14/2013   MCV 90.8 08/14/2013   PLT 203 08/14/2013   NEUTROABS 2.8 08/14/2013     RADIOGRAPHIC STUDIES: I have personally reviewed the radiology reports and agreed with their findings. Mammogram ultrasound and MRI of the breasts normal  ASSESSMENT & PLAN:  Breast cancer of upper-inner quadrant of right female breast Right breast invasive ductal carcinoma that is postlumpectomy 1 SLN -1.6 cm ER positive, PR negative, HER-2 negative, Oncotype DX recurrence score 9, 7% risk of recurrence, status post radiation and currently on Arimidex and started 05/08/2012  Arimidex related toxicities: Osteopenia Occasional hot flashes  Surveillance: mammogram done October 2015 along  with ultrasound and bilateral MRIs were all normal. These tests were done because she felt nodules in both breasts. At the site of the palpable abnormality there were no abnormal mammographic or MRI findings of concern. We will continue her surveillance once a year with mammograms and follow -up  Survivorship: Discussed the importance of physical exercise in decreasing the likelihood of breast cancer recurrence. Recommended 30 mins daily 6 days a week of either brisk walking or cycling or swimming. Encouraged patient to eat more fruits and vegetables and decrease red meat.      Orders Placed This Encounter  Procedures  . DG Bone Density    Standing Status: Future     Number of Occurrences:      Standing Expiration Date: 02/14/2015    Order Specific Question:  Reason for Exam (SYMPTOM  OR DIAGNOSIS REQUIRED)    Answer:  osteopenia on aromatase inhibitor therapy    Order Specific Question:  Preferred imaging location?    Answer:  [facility]  . CBC with Differential    Standing Status: Future     Number of Occurrences:      Standing Expiration Date: 02/14/2015  . Comprehensive metabolic panel (Cmet) - [facility]    Standing Status: Future     Number of Occurrences:      Standing Expiration Date: 02/14/2015   The patient has a good understanding of the overall plan. she agrees with it. She will call with any problems that may develop before her next visit here.   Rulon Eisenmenger, MD 02/14/2014 9:54 AM

## 2014-02-14 NOTE — Telephone Encounter (Signed)
, °

## 2014-02-14 NOTE — Assessment & Plan Note (Addendum)
Right breast invasive ductal carcinoma that is postlumpectomy 1 SLN -1.6 cm ER positive, PR negative, HER-2 negative, Oncotype DX recurrence score 9, 7% risk of recurrence, status post radiation and currently on Arimidex and started 05/08/2012  Arimidex related toxicities: Osteopenia Occasional hot flashes  Surveillance: mammogram done October 2015 along with ultrasound and bilateral MRIs were all normal. These tests were done because she felt nodules in both breasts. At the site of the palpable abnormality there were no abnormal mammographic or MRI findings of concern. We will continue her surveillance once a year with mammograms and follow -up  Survivorship: Discussed the importance of physical exercise in decreasing the likelihood of breast cancer recurrence. Recommended 30 mins daily 6 days a week of either brisk walking or cycling or swimming. Encouraged patient to eat more fruits and vegetables and decrease red meat.

## 2014-02-18 ENCOUNTER — Telehealth: Payer: Self-pay | Admitting: Family Medicine

## 2014-02-18 ENCOUNTER — Ambulatory Visit (INDEPENDENT_AMBULATORY_CARE_PROVIDER_SITE_OTHER): Payer: Medicare Other | Admitting: Family Medicine

## 2014-02-18 VITALS — BP 130/80 | Temp 98.5°F | Wt 153.0 lb

## 2014-02-18 DIAGNOSIS — R300 Dysuria: Secondary | ICD-10-CM

## 2014-02-18 DIAGNOSIS — N3001 Acute cystitis with hematuria: Secondary | ICD-10-CM | POA: Insufficient documentation

## 2014-02-18 LAB — POCT URINALYSIS DIPSTICK
Bilirubin, UA: NEGATIVE
Glucose, UA: NEGATIVE
Ketones, UA: NEGATIVE
Nitrite, UA: NEGATIVE
PROTEIN UA: 1
RBC UA: 1
SPEC GRAV UA: 1.02
UROBILINOGEN UA: 1
pH, UA: 5

## 2014-02-18 MED ORDER — CIPROFLOXACIN HCL 250 MG PO TABS: 250.0000 mg | ORAL_TABLET | Freq: Two times a day (BID) | ORAL | Status: DC

## 2014-02-18 NOTE — Telephone Encounter (Signed)
Patient will need an office visit. Okay to work in at the end of the day.

## 2014-02-18 NOTE — Telephone Encounter (Signed)
Pt would like have urine test done for ?UTI.  Pt does not want to see dr todd. Can I sch just UA

## 2014-02-18 NOTE — Progress Notes (Signed)
   Subjective:    Patient ID: Alexandria [name], female    DOB: 08-28-1940, 73 y.o.   MRN: [id number]  HPI Alexandria [name] is a 73 year old recently widowed female....... her husband Francee Piccolo died last spring........ who comes in with a four-day history of frequency dysuria and today hematuria. No fever chills or back pain. Last urine tract infection couple years ago   Review of Systems    review of systems negative Objective:   Physical Exam  Well-developed well female no acute distress vital signs stable she's afebrile abdominal exam normal except for tenderness over the bladder.  Urinalysis shows 1+ blood 1+ white cells small amount of protein      Assessment & Plan:  Acute hemorrhagic cystitis....... Cipro 250 twice a day for 1 week

## 2014-02-18 NOTE — Telephone Encounter (Signed)
Pt has been sch

## 2014-02-18 NOTE — Patient Instructions (Signed)
Cipro 250 mg........ one twice daily for 3 days then 1 at bedtime until the bottle is empty  Drink lots water  OTC Pyridium........ 3 times daily for dysuria

## 2014-02-18 NOTE — Progress Notes (Signed)
Pre visit review using our clinic review tool, if applicable. No additional management support is needed unless otherwise documented below in the visit note. 

## 2014-07-01 ENCOUNTER — Ambulatory Visit
Admission: RE | Admit: 2014-07-01 | Discharge: 2014-07-01 | Disposition: A | Discharge status: Home / Self Care | Payer: Medicare Other | Source: Ambulatory Visit | Attending: Hematology and Oncology | Admitting: Hematology and Oncology

## 2014-07-01 DIAGNOSIS — M8580 Other specified disorders of bone density and structure, unspecified site: Secondary | ICD-10-CM

## 2014-07-15 ENCOUNTER — Encounter: Payer: Self-pay | Admitting: *Deleted

## 2014-07-15 NOTE — Progress Notes (Signed)
Received bone density report from [facility], sent to scan.

## 2014-07-31 ENCOUNTER — Other Ambulatory Visit: Payer: Self-pay | Admitting: Family Medicine

## 2014-09-05 ENCOUNTER — Ambulatory Visit: Payer: Self-pay | Admitting: Hematology and Oncology

## 2014-09-05 ENCOUNTER — Telehealth: Payer: Self-pay | Admitting: Hematology and Oncology

## 2014-09-05 ENCOUNTER — Other Ambulatory Visit: Payer: Self-pay

## 2014-09-05 NOTE — Assessment & Plan Note (Signed)
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 Breast exam 09/05/2014: Normal 2. Mammogram 12/24/2014 No abnormalities. Postsurgical changes. Breast Density Category C. I recommended that Alexandria [name] get 3-D mammograms for surveillance. Discussed the differences between different breast density categories. 3. MRI breast November 2016 also normal 4. Bone density 07/01/2014: T score -1.2 mild osteopenia   Return to clinic 6 months for follow-up

## 2014-09-05 NOTE — Telephone Encounter (Signed)
pt came by wanting to check sch-wanted to know the time-gave pt time of appt, pt stated it was 9:00/lab 9:40/[place]-adv pt that appt was sch on 12/4 @ last appt and Dr Lindi Adie sch would not allow [place] to sch a 9:40 appt-adv he is every 15 min-adv on 10/15 there was an [place] sch for 9:40 and look at appt and it had not been chgd-pt stated just cancel -asked if wanted to r/s-pt stated no and walked out

## 2014-09-22 ENCOUNTER — Other Ambulatory Visit: Payer: Self-pay | Admitting: Oncology

## 2014-09-22 NOTE — Telephone Encounter (Signed)
Last OV 09/05/14.  Chart reviewed.  Pt needs fu appt.

## 2014-09-23 ENCOUNTER — Telehealth: Payer: Self-pay | Admitting: Hematology and Oncology

## 2014-09-23 NOTE — Telephone Encounter (Signed)
Returned Advertising account executive. Patient confirmed appointment 07/25

## 2014-10-06 ENCOUNTER — Ambulatory Visit (HOSPITAL_BASED_OUTPATIENT_CLINIC_OR_DEPARTMENT_OTHER): Payer: Medicare Other | Admitting: Hematology and Oncology

## 2014-10-06 ENCOUNTER — Ambulatory Visit (HOSPITAL_COMMUNITY)
Admission: RE | Admit: 2014-10-06 | Discharge: 2014-10-06 | Disposition: A | Discharge status: Home / Self Care | Payer: Medicare Other | Source: Ambulatory Visit | Attending: Hematology and Oncology | Admitting: Hematology and Oncology

## 2014-10-06 ENCOUNTER — Encounter: Payer: Self-pay | Admitting: Hematology and Oncology

## 2014-10-06 ENCOUNTER — Other Ambulatory Visit (HOSPITAL_BASED_OUTPATIENT_CLINIC_OR_DEPARTMENT_OTHER): Payer: Medicare Other

## 2014-10-06 ENCOUNTER — Telehealth: Payer: Self-pay | Admitting: Hematology and Oncology

## 2014-10-06 VITALS — BP 148/73 | HR 58 | Temp 98.5°F | Resp 18 | Ht 64.0 in | Wt 149.2 lb

## 2014-10-06 DIAGNOSIS — Z79811 Long term (current) use of aromatase inhibitors: Secondary | ICD-10-CM

## 2014-10-06 DIAGNOSIS — M8580 Other specified disorders of bone density and structure, unspecified site: Secondary | ICD-10-CM | POA: Diagnosis not present

## 2014-10-06 DIAGNOSIS — Z170 Estrogen receptor positive status [ER+]: Secondary | ICD-10-CM | POA: Diagnosis not present

## 2014-10-06 DIAGNOSIS — C50211 Malignant neoplasm of upper-inner quadrant of right female breast: Secondary | ICD-10-CM

## 2014-10-06 DIAGNOSIS — C50919 Malignant neoplasm of unspecified site of unspecified female breast: Secondary | ICD-10-CM | POA: Insufficient documentation

## 2014-10-06 DIAGNOSIS — R079 Chest pain, unspecified: Secondary | ICD-10-CM

## 2014-10-06 LAB — CBC WITH DIFFERENTIAL/PLATELET
BASO%: 1.1 % (ref 0.0–2.0)
Basophils Absolute: 0.1 10*3/uL (ref 0.0–0.1)
EOS%: 1.4 % (ref 0.0–7.0)
Eosinophils Absolute: 0.1 10*3/uL (ref 0.0–0.5)
HCT: 38.7 % (ref 34.8–46.6)
HGB: 13.1 g/dL (ref 11.6–15.9)
LYMPH#: 1.8 10*3/uL (ref 0.9–3.3)
LYMPH%: 28.3 % (ref 14.0–49.7)
MCH: 30.2 pg (ref 25.1–34.0)
MCHC: 33.8 g/dL (ref 31.5–36.0)
MCV: 89.5 fL (ref 79.5–101.0)
MONO#: 0.5 10*3/uL (ref 0.1–0.9)
MONO%: 7.2 % (ref 0.0–14.0)
NEUT#: 4 10*3/uL (ref 1.5–6.5)
NEUT%: 62 % (ref 38.4–76.8)
Platelets: 216 10*3/uL (ref 145–400)
RBC: 4.33 10*6/uL (ref 3.70–5.45)
RDW: 14.1 % (ref 11.2–14.5)
WBC: 6.4 10*3/uL (ref 3.9–10.3)

## 2014-10-06 LAB — COMPREHENSIVE METABOLIC PANEL (CC13)
ALK PHOS: 64 U/L (ref 40–150)
ALT: 13 U/L (ref 0–55)
AST: 19 U/L (ref 5–34)
Albumin: 4 g/dL (ref 3.5–5.0)
Anion Gap: 9 mEq/L (ref 3–11)
BUN: 16.9 mg/dL (ref 7.0–26.0)
CO2: 30 mEq/L — ABNORMAL HIGH (ref 22–29)
Calcium: 9.6 mg/dL (ref 8.4–10.4)
Chloride: 107 mEq/L (ref 98–109)
Creatinine: 0.9 mg/dL (ref 0.6–1.1)
EGFR: 63 mL/min/{1.73_m2} — AB (ref >90)
GLUCOSE: 96 mg/dL (ref 70–140)
POTASSIUM: 4.8 meq/L (ref 3.5–5.1)
SODIUM: 145 meq/L (ref 136–145)
Total Bilirubin: 1.55 mg/dL — ABNORMAL HIGH (ref 0.20–1.20)
Total Protein: 6.9 g/dL (ref 6.4–8.3)

## 2014-10-06 MED ORDER — TRAMADOL HCL 50 MG PO TABS: 50.0000 mg | ORAL_TABLET | Freq: Every day | ORAL | Status: DC

## 2014-10-06 MED ORDER — ANASTROZOLE 1 MG PO TABS: 1.0000 mg | ORAL_TABLET | Freq: Every day | ORAL | Status: DC

## 2014-10-06 NOTE — Assessment & Plan Note (Signed)
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 Breast exam 10/06/2014: Normal 2. Mammogram 12/23/2013, MRI 02/10/2014. Postsurgical changes. Breast Density Category C. I recommended that she get 3-D mammograms for surveillance. Discussed the differences between different breast density categories.  Return to clinic once a year for follow-up

## 2014-10-06 NOTE — Telephone Encounter (Signed)
appoinments made and avs pritned for patient

## 2014-10-06 NOTE — Progress Notes (Signed)
Patient Care Team: Dorena Cookey, MD as PCP - General  DIAGNOSIS: Breast cancer of upper-inner quadrant of right female breast   Staging form: Breast, AJCC 7th Edition     Clinical stage from 12/28/2011: Stage IIA (T2, N0, cM0) - Unsigned     Pathologic: No stage assigned - Unsigned   SUMMARY OF ONCOLOGIC HISTORY:   Breast cancer of upper-inner quadrant of right female breast   01/04/2012 Surgery Right breast lumpectomy and a 1.6 cm IDC with negative margins, 1 SLN negative grade 1 with low-grade DCIS, ER 100%, PR 0%, HER-2 negative ratio 1.14, Ki-67 27% T1 C. N0 M0 stage IA Oncotype DX score 9 7%ROR   02/28/2012 - 04/04/2012 Radiation Therapy Adjuvant radiation therapy   05/08/2012 -  Anti-estrogen oral therapy Arimidex 1 mg daily  5 years is the treatment plan    CHIEF COMPLIANT: Follow-up on Arimidex, right chest wall pain  INTERVAL HISTORY: Alexandria [name] is a 74 year old with above-mentioned history of right breast cancer treated with lumpectomy and radiation is currently on Arimidex. She is tolerating it fairly well. Has a very occasional hot flashes. She is under a lot of stress recently because of her mother's passing as well as trying to sell a several of her houses and refinance and existing house. She plans to move into her mother's house later this year. She complains of a pain in the right rib cage especially when she bends forward.  REVIEW OF SYSTEMS:   Constitutional: Denies fevers, chills or abnormal weight loss Eyes: Denies blurriness of vision Ears, nose, mouth, throat, and face: Denies mucositis or sore throat Respiratory: Denies cough, dyspnea or wheezes Cardiovascular: Denies palpitation, chest discomfort or lower extremity swelling Gastrointestinal:  Denies nausea, heartburn or change in bowel habits Skin: Denies abnormal skin rashes Lymphatics: Denies new lymphadenopathy or easy bruising Neurological:Denies numbness, tingling or new weaknesses Behavioral/Psych:  Mood is stable, no new changes  Breast:  Right chest wall and axillary pain All other systems were reviewed with the patient and are negative.  I have reviewed the past medical history, past surgical history, social history and family history with the patient and they are unchanged from previous note.  ALLERGIES:  is allergic to aspirin; chocolate; coffee bean extract; ibuprofen; oxycodone hcl; penicillins; shrimp; sulfonamide derivatives; augmentin; and eggs or egg-derived products.  MEDICATIONS:  Current Outpatient Prescriptions  Medication Sig Dispense Refill  . Acetaminophen (TYLENOL ARTHRITIS EXT RELIEF PO) Take 650 mg by mouth daily.    Marland Kitchen albuterol (VENTOLIN HFA) 108 (90 BASE) MCG/ACT inhaler INHALE 2 PUFFS INTO THE LUNGS EVERY 6 HOURS AS NEEDED FOR SHORTNESS OF BREATH AND WHEEZING. 18 g 3  . amLODipine (NORVASC) 5 MG tablet TAKE 1 TABLET ONCE DAILY. 90 tablet 1  . anastrozole (ARIMIDEX) 1 MG tablet Take 1 tablet (1 mg total) by mouth daily. 90 tablet 3  . atenolol-chlorthalidone (TENORETIC) 50-25 MG per tablet Take 1 tablet by mouth daily.    Marland Kitchen atorvastatin (LIPITOR) 20 MG tablet Take 1 tablet (20 mg total) by mouth once. Twice weekly 50 tablet 3  . b complex vitamins tablet Take 1 tablet by mouth daily. PLUS FOLIC ACID PLUS VIT. C    . Cholecalciferol (VITAMIN D-3) 5000 UNITS TABS Take 50,000 Units by mouth daily.     . ciprofloxacin (CIPRO) 250 MG tablet Take 1 tablet (250 mg total) by mouth 2 (two) times daily. 10 tablet 1  . clopidogrel (PLAVIX) 75 MG tablet TAKE 1 TABLET DAILY. 90 tablet  3  . co-enzyme Q-10 50 MG capsule Take 200 mg by mouth daily.    . cyclobenzaprine (FLEXERIL) 5 MG tablet Take 5 mg by mouth 3 (three) times daily as needed. For muscle spasms    . escitalopram (LEXAPRO) 10 MG tablet TAKE 1/2 TO 1 TABLET AT BEDTIME. 90 tablet 3  . fluticasone (FLONASE) 50 MCG/ACT nasal spray Place 2 sprays into both nostrils daily. 16 g 11  . Fluticasone-Salmeterol (ADVAIR  DISKUS) 100-50 MCG/DOSE AEPB Inhale 1 puff into the lungs 2 (two) times daily at 10 AM and 5 PM. 60 each 11  . glucosamine-chondroitin 500-400 MG tablet Take 1 tablet by mouth 2 (two) times daily. 1500/1200 MG    . hydrochlorothiazide (MICROZIDE) 12.5 MG capsule TAKE 1 CAPSULE IN THE MORNING AS NEEDED. 90 capsule 3  . HYDROcodone-acetaminophen (NORCO/VICODIN) 5-325 MG per tablet     . loratadine (CLARITIN) 10 MG tablet Take 10 mg by mouth daily.      . LUTEIN PO Take 1 tablet by mouth daily.     . meloxicam (MOBIC) 15 MG tablet TAKE (2) TABLETS DAILY. 180 tablet 3  . metoprolol succinate (TOPROL-XL) 25 MG 24 hr tablet TAKE 1 TABLET ONCE DAILY WITH A MEAL. 100 tablet 3  . ondansetron (ZOFRAN) 4 MG tablet Take 4 mg by mouth every 8 (eight) hours as needed for nausea or vomiting.    . pantoprazole (PROTONIX) 40 MG tablet TAKE 1 TABLET EACH DAY. 90 tablet 3  . potassium chloride SA (K-DUR,KLOR-CON) 20 MEQ tablet TAKE 3 TABLETS DAILY. 270 tablet 3  . Probiotic Product (ALIGN PO) Take 1 tablet by mouth daily.     Marland Kitchen selenium 50 MCG TABS Take by mouth daily. $RemoveBefo'200mg'RunYDEChgFN$  qd    . traMADol (ULTRAM) 50 MG tablet Take 1 tablet (50 mg total) by mouth at bedtime. 30 tablet 5   No current facility-administered medications for this visit.    PHYSICAL EXAMINATION: ECOG PERFORMANCE STATUS: 1 - Symptomatic but completely ambulatory  Filed Vitals:   10/06/14 1050  BP: 148/73  Pulse: 58  Temp: 98.5 F (36.9 C)  Resp: 18   Filed Weights   10/06/14 1050  Weight: 149 lb 3 oz (67.671 kg)    GENERAL:alert, no distress and comfortable SKIN: skin color, texture, turgor are normal, no rashes or significant lesions EYES: normal, Conjunctiva are pink and non-injected, sclera clear OROPHARYNX:no exudate, no erythema and lips, buccal mucosa, and tongue normal  NECK: supple, thyroid normal size, non-tender, without nodularity LYMPH:  no palpable lymphadenopathy in the cervical, axillary or inguinal LUNGS: clear to  auscultation and percussion with normal breathing effort HEART: regular rate & rhythm and no murmurs and no lower extremity edema ABDOMEN:abdomen soft, non-tender and normal bowel sounds Musculoskeletal:no cyanosis of digits and no clubbing  NEURO: alert & oriented x 3 with fluent speech, no focal motor/sensory deficits BREAST: tenderness in the right chest wall otherwise both breasts do not have any nodules. (exam performed in the presence of a chaperone)  LABORATORY DATA:  I have reviewed the data as listed   Chemistry      Component Value Date/Time   NA 143 02/14/2014 1455   NA 141 12/30/2011 1208   K 4.6 02/14/2014 1455   K 3.8 12/30/2011 1208   CL 106 08/01/2012 1021   CL 102 12/30/2011 1208   CO2 27 02/14/2014 1455   CO2 30 12/30/2011 1208   BUN 16.1 02/14/2014 1455   BUN 20 12/30/2011 1208   CREATININE  0.9 02/14/2014 1455   CREATININE 0.86 12/30/2011 1208      Component Value Date/Time   CALCIUM 10.0 02/14/2014 1455   CALCIUM 9.2 12/30/2011 1208   ALKPHOS 79 02/14/2014 1455   ALKPHOS 58 06/04/2012 1039   AST 24 02/14/2014 1455   AST 27 06/04/2012 1039   ALT 17 02/14/2014 1455   ALT 20 06/04/2012 1039   BILITOT 0.94 02/14/2014 1455   BILITOT 1.7* 06/04/2012 1039       Lab Results  Component Value Date   WBC 6.4 10/06/2014   HGB 13.1 10/06/2014   HCT 38.7 10/06/2014   MCV 89.5 10/06/2014   PLT 216 10/06/2014   NEUTROABS 4.0 10/06/2014    ASSESSMENT & PLAN:  Breast cancer of upper-inner quadrant of right female breast 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 I will get a chest x-ray to evaluate if there is any underlying rib fracture. Severe stress: Patient's mother passed away recently and she is in the process of selling off several of her houses and is somewhat stressed out about all of this. She plans to move into her mother's house which is about 45 minutes away.  Breast Cancer Surveillance: 1. Breast exam 10/06/2014: Normal 2. Mammogram 12/23/2013, MRI 02/10/2014. Postsurgical changes. Breast Density Category C. I recommended that she get 3-D mammograms for surveillance. Discussed the differences between different breast density categories.  Return to clinic in 6 months and then subsequently once a year for follow-up      Orders Placed This Encounter  Procedures  . DG Chest 2 View    Standing Status: Future     Number of Occurrences:      Standing Expiration Date: 10/06/2015    Order Specific Question:  Reason for Exam (SYMPTOM  OR DIAGNOSIS REQUIRED)    Answer:  right chest pain, evaluate for rib fracture    Order Specific Question:  Preferred imaging location?    Answer:  [facility]    Order Specific Question:  Call Report- Best Contact Number?    Answer:  [phone] Dr. Lindi Adie   The patient has a good understanding of the overall plan. she agrees with it. she will call with any problems that may develop before the next visit here.   Rulon Eisenmenger, MD

## 2014-11-26 ENCOUNTER — Ambulatory Visit: Payer: Medicare Other | Admitting: Family Medicine

## 2014-12-08 ENCOUNTER — Ambulatory Visit (INDEPENDENT_AMBULATORY_CARE_PROVIDER_SITE_OTHER): Payer: Medicare Other | Admitting: Family Medicine

## 2014-12-08 ENCOUNTER — Encounter: Payer: Self-pay | Admitting: Family Medicine

## 2014-12-08 VITALS — BP 135/70 | HR 58 | Temp 98.0°F | Wt 145.0 lb

## 2014-12-08 DIAGNOSIS — I639 Cerebral infarction, unspecified: Secondary | ICD-10-CM | POA: Diagnosis not present

## 2014-12-08 DIAGNOSIS — K219 Gastro-esophageal reflux disease without esophagitis: Secondary | ICD-10-CM | POA: Diagnosis not present

## 2014-12-08 DIAGNOSIS — Z23 Encounter for immunization: Secondary | ICD-10-CM | POA: Diagnosis not present

## 2014-12-08 DIAGNOSIS — I10 Essential (primary) hypertension: Secondary | ICD-10-CM | POA: Diagnosis not present

## 2014-12-08 DIAGNOSIS — E785 Hyperlipidemia, unspecified: Secondary | ICD-10-CM

## 2014-12-08 LAB — BASIC METABOLIC PANEL
BUN: 13 mg/dL (ref 6–23)
CHLORIDE: 103 meq/L (ref 96–112)
CO2: 30 meq/L (ref 19–32)
Calcium: 9.7 mg/dL (ref 8.4–10.5)
Creatinine, Ser: 0.76 mg/dL (ref 0.40–1.20)
GFR: 79.5 mL/min (ref >60.00)
Glucose, Bld: 84 mg/dL (ref 70–99)
Potassium: 3.8 mEq/L (ref 3.5–5.1)
Sodium: 144 mEq/L (ref 135–145)

## 2014-12-08 LAB — CBC WITH DIFFERENTIAL/PLATELET
BASOS PCT: 0.8 % (ref 0.0–3.0)
Basophils Absolute: 0.1 10*3/uL (ref 0.0–0.1)
EOS PCT: 0.8 % (ref 0.0–5.0)
Eosinophils Absolute: 0.1 10*3/uL (ref 0.0–0.7)
HEMATOCRIT: 40.6 % (ref 36.0–46.0)
Hemoglobin: 13.3 g/dL (ref 12.0–15.0)
LYMPHS PCT: 21.1 % (ref 12.0–46.0)
Lymphs Abs: 1.6 10*3/uL (ref 0.7–4.0)
MCHC: 32.7 g/dL (ref 30.0–36.0)
MCV: 90.7 fl (ref 78.0–100.0)
MONOS PCT: 6.4 % (ref 3.0–12.0)
Monocytes Absolute: 0.5 10*3/uL (ref 0.1–1.0)
Neutro Abs: 5.3 10*3/uL (ref 1.4–7.7)
Neutrophils Relative %: 70.9 % (ref 43.0–77.0)
Platelets: 255 10*3/uL (ref 150.0–400.0)
RBC: 4.48 Mil/uL (ref 3.87–5.11)
RDW: 14 % (ref 11.5–15.5)
WBC: 7.5 10*3/uL (ref 4.0–10.5)

## 2014-12-08 LAB — LIPID PANEL
Cholesterol: 185 mg/dL (ref 0–200)
HDL: 56.7 mg/dL (ref >39.00)
LDL CALC: 105 mg/dL — AB (ref 0–99)
NonHDL: 128.76
Total CHOL/HDL Ratio: 3
Triglycerides: 120 mg/dL (ref 0.0–149.0)
VLDL: 24 mg/dL (ref 0.0–40.0)

## 2014-12-08 LAB — POCT URINALYSIS DIPSTICK
Blood, UA: NEGATIVE
GLUCOSE UA: NEGATIVE
Ketones, UA: NEGATIVE
Leukocytes, UA: NEGATIVE
Nitrite, UA: NEGATIVE
Protein, UA: NEGATIVE
Urobilinogen, UA: 0.2
pH, UA: 6

## 2014-12-08 LAB — HEPATIC FUNCTION PANEL
ALT: 12 U/L (ref 0–35)
AST: 18 U/L (ref 0–37)
Albumin: 4.2 g/dL (ref 3.5–5.2)
Alkaline Phosphatase: 63 U/L (ref 39–117)
BILIRUBIN TOTAL: 1 mg/dL (ref 0.2–1.2)
Bilirubin, Direct: 0.2 mg/dL (ref 0.0–0.3)
Total Protein: 6.8 g/dL (ref 6.0–8.3)

## 2014-12-08 LAB — TSH: TSH: 1.63 u[IU]/mL (ref 0.35–4.50)

## 2014-12-08 MED ORDER — POTASSIUM CHLORIDE CRYS ER 20 MEQ PO TBCR: EXTENDED_RELEASE_TABLET | ORAL | Status: DC

## 2014-12-08 MED ORDER — HYDROCHLOROTHIAZIDE 12.5 MG PO CAPS: ORAL_CAPSULE | ORAL | Status: DC

## 2014-12-08 MED ORDER — METOPROLOL SUCCINATE ER 25 MG PO TB24: ORAL_TABLET | ORAL | Status: DC

## 2014-12-08 MED ORDER — PANTOPRAZOLE SODIUM 40 MG PO TBEC: DELAYED_RELEASE_TABLET | ORAL | Status: DC

## 2014-12-08 MED ORDER — AMLODIPINE BESYLATE 5 MG PO TABS: 5.0000 mg | ORAL_TABLET | Freq: Every day | ORAL | Status: DC

## 2014-12-08 MED ORDER — CLOPIDOGREL BISULFATE 75 MG PO TABS: ORAL_TABLET | ORAL | Status: DC

## 2014-12-08 MED ORDER — ATORVASTATIN CALCIUM 20 MG PO TABS: 20.0000 mg | ORAL_TABLET | Freq: Once | ORAL | Status: DC

## 2014-12-08 NOTE — Patient Instructions (Addendum)
Continue current medication  Check your blood pressure once a day in the morning weekly  Return in January February for a annual physical examination  U new practitioner we'll be [facility]........... Cory just finished the adult Designer, jewellery course at [employer] today........ I will call you if there is anything abnormal

## 2014-12-08 NOTE — Progress Notes (Signed)
   Subjective:    Patient ID: Alexandria [name], female    DOB: [birth year], 74 y.o.   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 Her blood pressures at home are normal the case she she gets some slightly elevated pressures and is concerned about those numbers. However her blood pressures are all normal to 140/90 or less.  She thought 120/80 was high normal   Review of Systems Review of systems otherwise negative do her physical examination    Objective:   Physical Exam  Well-developed well-nourished female no acute distress vital signs stable she's afebrile BP this morning at home 120/80. BP here 160/80 right arm sitting position with her cuff and ours      Assessment & Plan:  Hypertension at goal with normal blood pressures at home......... continue current therapy  Hyperlipidemia continue current therapy  CPX when available

## 2014-12-08 NOTE — Progress Notes (Signed)
Pre visit review using our clinic review tool, if applicable. No additional management support is needed unless otherwise documented below in the visit note. 

## 2014-12-19 ENCOUNTER — Other Ambulatory Visit: Payer: Self-pay | Admitting: Hematology and Oncology

## 2014-12-19 DIAGNOSIS — Z853 Personal history of malignant neoplasm of breast: Secondary | ICD-10-CM

## 2014-12-19 DIAGNOSIS — Z9889 Other specified postprocedural states: Secondary | ICD-10-CM

## 2015-01-02 ENCOUNTER — Other Ambulatory Visit: Payer: Self-pay | Admitting: Hematology and Oncology

## 2015-01-02 ENCOUNTER — Ambulatory Visit
Admission: RE | Admit: 2015-01-02 | Discharge: 2015-01-02 | Disposition: A | Discharge status: Home / Self Care | Payer: Medicare Other | Source: Ambulatory Visit | Attending: Hematology and Oncology | Admitting: Hematology and Oncology

## 2015-01-02 DIAGNOSIS — Z9889 Other specified postprocedural states: Secondary | ICD-10-CM

## 2015-01-02 DIAGNOSIS — Z853 Personal history of malignant neoplasm of breast: Secondary | ICD-10-CM

## 2015-02-20 ENCOUNTER — Other Ambulatory Visit: Payer: Self-pay | Admitting: Family Medicine

## 2015-03-20 ENCOUNTER — Ambulatory Visit: Payer: Self-pay | Admitting: Family Medicine

## 2015-04-12 NOTE — Assessment & Plan Note (Signed)
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 She plans to move into her mother's house which is about 45 minutes away.  Breast Cancer Surveillance: 1. Breast exam 04/13/15: Normal 2. Mammogram 01/02/2015, MRI 02/10/2014. Postsurgical changes. Breast Density Category C. I recommended that she get 3-D mammograms for surveillance. Discussed the differences between different breast density categories.  Return to clinic in 1 year for follow-up

## 2015-04-13 ENCOUNTER — Encounter: Payer: Self-pay | Admitting: Hematology and Oncology

## 2015-04-13 ENCOUNTER — Ambulatory Visit (HOSPITAL_BASED_OUTPATIENT_CLINIC_OR_DEPARTMENT_OTHER): Payer: Medicare Other | Admitting: Hematology and Oncology

## 2015-04-13 ENCOUNTER — Telehealth: Payer: Self-pay | Admitting: Hematology and Oncology

## 2015-04-13 VITALS — BP 134/66 | HR 66 | Temp 97.8°F | Resp 18 | Ht 64.0 in | Wt 152.7 lb

## 2015-04-13 DIAGNOSIS — M8580 Other specified disorders of bone density and structure, unspecified site: Secondary | ICD-10-CM

## 2015-04-13 DIAGNOSIS — Z79811 Long term (current) use of aromatase inhibitors: Secondary | ICD-10-CM

## 2015-04-13 DIAGNOSIS — Z170 Estrogen receptor positive status [ER+]: Secondary | ICD-10-CM | POA: Diagnosis not present

## 2015-04-13 DIAGNOSIS — C50211 Malignant neoplasm of upper-inner quadrant of right female breast: Secondary | ICD-10-CM | POA: Diagnosis not present

## 2015-04-13 NOTE — Progress Notes (Signed)
Unable to get in to exam room prior to MD.  No assessment performed.  

## 2015-04-13 NOTE — Progress Notes (Signed)
Patient Care Team: Dorena Cookey, MD as PCP - General  DIAGNOSIS: Breast cancer of upper-inner quadrant of right female breast [facility]   Staging form: Breast, AJCC 7th Edition     Clinical stage from 12/28/2011: Stage IIA (T2, N0, cM0) - Unsigned     Pathologic: No stage assigned - Unsigned   SUMMARY OF ONCOLOGIC HISTORY:   Breast cancer of upper-inner quadrant of right female breast ([place])   01/04/2012 Surgery Right breast lumpectomy and a 1.6 cm IDC with negative margins, 1 SLN negative grade 1 with low-grade DCIS, ER 100%, PR 0%, HER-2 negative ratio 1.14, Ki-67 27% T1 C. N0 M0 stage IA Oncotype DX score 9 7%ROR   02/28/2012 - 04/04/2012 Radiation Therapy Adjuvant radiation therapy   05/08/2012 -  Anti-estrogen oral therapy Arimidex 1 mg daily  5 years is the treatment plan    CHIEF COMPLIANT: follow-up on anastrozole  INTERVAL HISTORY: Alexandria [name] is a 31 with above-mentioned history of right lumpectomy and radiation currently on Arimidex therapy. She is tolerating it fairly well. 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 She is undergoing physical therapy with chiropractor treatments. It appears to be helping her. She is not in much pain. She denies any lumps or nodules in the breast. She has occasional breast tenderness in the right-sided axillary surgical scar.  REVIEW OF SYSTEMS:   Constitutional: Denies fevers, chills or abnormal weight loss Eyes: Denies blurriness of vision Ears, nose, mouth, throat, and face: Denies mucositis or sore throat Respiratory: Denies cough, dyspnea or wheezes Cardiovascular: Denies palpitation, chest discomfort Gastrointestinal:  Denies nausea, heartburn or change in bowel habits Skin: Denies abnormal skin rashes Lymphatics: Denies new lymphadenopathy or easy bruising Neurological:Denies numbness, tingling or new weaknesses Behavioral/Psych: Mood is stable, no new changes  Extremities: No lower  extremity edema All other systems were reviewed with the patient and are negative.  I have reviewed the past medical history, past surgical history, social history and family history with the patient and they are unchanged from previous note.  ALLERGIES:  is allergic to aspirin; chocolate; coffee bean extract; ibuprofen; oxycodone hcl; penicillins; shrimp; sulfonamide derivatives; augmentin; and eggs or egg-derived products.  MEDICATIONS:  Current Outpatient Prescriptions  Medication Sig Dispense Refill  . Acetaminophen (TYLENOL ARTHRITIS EXT RELIEF PO) Take 650 mg by mouth daily.    Marland Kitchen albuterol (VENTOLIN HFA) 108 (90 BASE) MCG/ACT inhaler INHALE 2 PUFFS INTO THE LUNGS EVERY 6 HOURS AS NEEDED FOR SHORTNESS OF BREATH AND WHEEZING. 18 g 3  . amLODipine (NORVASC) 5 MG tablet Take 1 tablet (5 mg total) by mouth daily. 90 tablet 3  . anastrozole (ARIMIDEX) 1 MG tablet Take 1 tablet (1 mg total) by mouth daily. 90 tablet 3  . atenolol-chlorthalidone (TENORETIC) 50-25 MG per tablet Take 1 tablet by mouth daily.    Marland Kitchen atorvastatin (LIPITOR) 20 MG tablet Take 1 tablet (20 mg total) by mouth once. Twice weekly 50 tablet 3  . b complex vitamins tablet Take 1 tablet by mouth daily. PLUS FOLIC ACID PLUS VIT. C    . Cholecalciferol (VITAMIN D-3) 5000 UNITS TABS Take 50,000 Units by mouth daily.     . ciprofloxacin (CIPRO) 250 MG tablet Take 1 tablet (250 mg total) by mouth 2 (two) times daily. (Patient not taking: Reported on 12/08/2014) 10 tablet 1  . clopidogrel (PLAVIX) 75 MG tablet TAKE 1 TABLET DAILY. 90 tablet 3  . co-enzyme Q-10 50  MG capsule Take 200 mg by mouth daily.    . cyclobenzaprine (FLEXERIL) 5 MG tablet Take 5 mg by mouth 3 (three) times daily as needed. For muscle spasms    . escitalopram (LEXAPRO) 10 MG tablet TAKE 1/2 TO 1 TABLET AT BEDTIME. 90 tablet 0  . fluticasone (FLONASE) 50 MCG/ACT nasal spray Place 2 sprays into both nostrils daily. 16 g 11  . Fluticasone-Salmeterol (ADVAIR  DISKUS) 100-50 MCG/DOSE AEPB Inhale 1 puff into the lungs 2 (two) times daily at 10 AM and 5 PM. 60 each 11  . glucosamine-chondroitin 500-400 MG tablet Take 1 tablet by mouth 2 (two) times daily. 1500/1200 MG    . hydrochlorothiazide (MICROZIDE) 12.5 MG capsule TAKE 1 CAPSULE IN THE MORNING AS NEEDED. 90 capsule 3  . HYDROcodone-acetaminophen (NORCO/VICODIN) 5-325 MG per tablet     . loratadine (CLARITIN) 10 MG tablet Take 10 mg by mouth daily.      . LUTEIN PO Take 1 tablet by mouth daily.     . meloxicam (MOBIC) 15 MG tablet TAKE (2) TABLETS DAILY. (Patient not taking: Reported on 12/08/2014) 180 tablet 3  . metoprolol succinate (TOPROL-XL) 25 MG 24 hr tablet TAKE 1 TABLET ONCE DAILY WITH A MEAL. 100 tablet 3  . ondansetron (ZOFRAN) 4 MG tablet Take 4 mg by mouth every 8 (eight) hours as needed for nausea or vomiting.    . pantoprazole (PROTONIX) 40 MG tablet TAKE 1 TABLET EACH DAY. 90 tablet 3  . potassium chloride SA (K-DUR,KLOR-CON) 20 MEQ tablet TAKE 3 TABLETS DAILY. 270 tablet 3  . Probiotic Product (ALIGN PO) Take 1 tablet by mouth daily.     Marland Kitchen selenium 50 MCG TABS Take by mouth daily. 254m qd    . traMADol (ULTRAM) 50 MG tablet Take 1 tablet (50 mg total) by mouth at bedtime. 30 tablet 5   No current facility-administered medications for this visit.    PHYSICAL EXAMINATION: ECOG PERFORMANCE STATUS: 1 - Symptomatic but completely ambulatory  Filed Vitals:   04/13/15 1106  BP: 134/66  Pulse: 66  Temp: 97.8 F (36.6 C)  Resp: 18   Filed Weights   04/13/15 1106  Weight: 152 lb 11.2 oz (69.264 kg)    GENERAL:alert, no distress and comfortable SKIN: skin color, texture, turgor are normal, no rashes or significant lesions EYES: normal, Conjunctiva are pink and non-injected, sclera clear OROPHARYNX:no exudate, no erythema and lips, buccal mucosa, and tongue normal  NECK: supple, thyroid normal size, non-tender, without nodularity LYMPH:  no palpable lymphadenopathy in the  cervical, axillary or inguinal LUNGS: clear to auscultation and percussion with normal breathing effort HEART: regular rate & rhythm and no murmurs and no lower extremity edema ABDOMEN:abdomen soft, non-tender and normal bowel sounds MUSCULOSKELETAL:no cyanosis of digits and no clubbing  NEURO: alert & oriented x 3 with fluent speech, no focal motor/sensory deficits EXTREMITIES: No lower extremity edema BREAST: No palpable masses or nodules in either right or left breasts. No palpable axillary supraclavicular or infraclavicular adenopathy no breast tenderness or nipple discharge. (exam performed in the presence of a chaperone)  LABORATORY DATA:  I have reviewed the data as listed   Chemistry      Component Value Date/Time   NA 144 12/08/2014 1125   NA 145 10/06/2014 1036   K 3.8 12/08/2014 1125   K 4.8 10/06/2014 1036   CL 103 12/08/2014 1125   CL 106 08/01/2012 1021   CO2 30 12/08/2014 1125   CO2 30* 10/06/2014 1036  BUN 13 12/08/2014 1125   BUN 16.9 10/06/2014 1036   CREATININE 0.76 12/08/2014 1125   CREATININE 0.9 10/06/2014 1036      Component Value Date/Time   CALCIUM 9.7 12/08/2014 1125   CALCIUM 9.6 10/06/2014 1036   ALKPHOS 63 12/08/2014 1125   ALKPHOS 64 10/06/2014 1036   AST 18 12/08/2014 1125   AST 19 10/06/2014 1036   ALT 12 12/08/2014 1125   ALT 13 10/06/2014 1036   BILITOT 1.0 12/08/2014 1125   BILITOT 1.55* 10/06/2014 1036       Lab Results  Component Value Date   WBC 7.5 12/08/2014   HGB 13.3 12/08/2014   HCT 40.6 12/08/2014   MCV 90.7 12/08/2014   PLT 255.0 12/08/2014   NEUTROABS 5.3 12/08/2014   ASSESSMENT & PLAN:  Breast cancer of upper-inner quadrant of right female breast 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 She is in significantly less pain than before.  Severe stress: Patient's mother passed away Last year and she is in the process of selling off several of her houses and is somewhat stressed out about all of this. She did not go into her mother's house because of issues with her sister. She tells me that she is working on relieving her stress.  Breast Cancer Surveillance: 1. Breast exam 04/13/15: Normal 2. Mammogram 01/02/2015, MRI 02/10/2014. Postsurgical changes. Breast Density Category C. I recommended that she get 3-D mammograms for surveillance. Discussed the differences between different breast density categories.  Return to clinic in 1 year for follow-up   No orders of the defined types were placed in this encounter.   The patient has a good understanding of the overall plan. she agrees with it. she will call with any problems that may develop before the next visit here.   Rulon Eisenmenger, MD 04/13/2015

## 2015-04-13 NOTE — Telephone Encounter (Signed)
Appointments made and avs given to patient °

## 2015-04-16 ENCOUNTER — Encounter: Payer: Self-pay | Admitting: Cardiology

## 2015-04-22 ENCOUNTER — Other Ambulatory Visit: Payer: Self-pay | Admitting: Family Medicine

## 2015-04-23 ENCOUNTER — Ambulatory Visit: Payer: Self-pay | Admitting: Adult Health

## 2015-05-19 ENCOUNTER — Other Ambulatory Visit: Payer: Self-pay

## 2015-05-19 DIAGNOSIS — I83892 Varicose veins of left lower extremities with other complications: Secondary | ICD-10-CM

## 2015-06-05 ENCOUNTER — Encounter: Payer: Self-pay | Admitting: Vascular Surgery

## 2015-06-08 ENCOUNTER — Other Ambulatory Visit: Payer: Self-pay | Admitting: Family Medicine

## 2015-06-16 ENCOUNTER — Encounter: Payer: Self-pay | Admitting: Vascular Surgery

## 2015-06-16 ENCOUNTER — Ambulatory Visit (HOSPITAL_COMMUNITY)
Admission: RE | Admit: 2015-06-16 | Discharge: 2015-06-16 | Disposition: A | Discharge status: Home / Self Care | Payer: Medicare Other | Source: Ambulatory Visit | Attending: Vascular Surgery | Admitting: Vascular Surgery

## 2015-06-16 ENCOUNTER — Ambulatory Visit (INDEPENDENT_AMBULATORY_CARE_PROVIDER_SITE_OTHER): Payer: Medicare Other | Admitting: Vascular Surgery

## 2015-06-16 VITALS — BP 138/78 | HR 71 | Temp 98.0°F | Resp 16 | Ht 63.75 in | Wt 152.0 lb

## 2015-06-16 DIAGNOSIS — I83893 Varicose veins of bilateral lower extremities with other complications: Secondary | ICD-10-CM | POA: Diagnosis not present

## 2015-06-16 DIAGNOSIS — I83892 Varicose veins of left lower extremities with other complications: Secondary | ICD-10-CM

## 2015-06-16 NOTE — Progress Notes (Signed)
Subjective:    Patient ID: Alexandria [name], female DOB: 15-Sep-1940, 75 y.o.   MRN: [id number]   HPI:  This 75 year old female was referred by Dr. Alain Marion 4 pain and swelling in the lower extremities. Patient is  Known to  Me having undergone right great and small laser ablation and left great saphenous laser ablation in 2009. She had a good result with stab phlebectomy's performed bilaterally. Several years later she began developing some recurrent varicosities. She has no history of DVT thrombophlebitis stasis ulcers or bleeding. She does not last compression stockings. She is having significant pain in her around her left knee with swelling. She is seeing Dr. Adriana Mccallum and she may need a left knee replacement. She has had multiple injections recently.  Past Medical History  Diagnosis Date  . Allergy   . Hearing loss     bilateral hearing aids  . Hyperlipidemia   . GERD (gastroesophageal reflux disease)   . COPD (chronic obstructive pulmonary disease) ([place])   . 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 problems with balance  . IBS (irritable bowel syndrome)   . History of kidney stones   . 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 Traumatic hemopneumothorax 1982    left  . PVD (peripheral vascular disease) ([facility])     varicose veins - left worse than right  . Anxiety   . Depression   . Hypertension     under control, has been on med. since age 78  . Spinal stenosis   . Arthritis     left knee, hands, back  . Palpitations   . Abrasion of finger of left hand 12/29/2011    left middle   . Dental crowns present   . Breast cancer ([place]) 12/2011    right, ER+, PR -, Her 2 -  . Asthma     daily and prn inhalers  . Long term current use of aromatase inhibitor 05/08/2012  . Hx of radiation therapy 03/12/12 -04/13/12    right breast    Social History  Substance Use Topics  . Smoking status: Former Smoker -- 0.50  packs/day for 15 years    Types: Cigarettes    Quit date: 08/28/1993  . Smokeless tobacco: Never Used  . Alcohol Use: No    Family History  Problem Relation Age of Onset  . Cancer Maternal Aunt     breast  . Cancer Paternal Aunt     Multiple Myeloma  . Stomach cancer Paternal Uncle   . Breast cancer Paternal Uncle   . Breast cancer Cousin     early 45s  . Breast cancer Paternal Aunt     late 70s-60s  . Heart attack Father     Died age 50  . Heart attack Paternal Grandfather   . Breast cancer Paternal Aunt     bilateral breast cancer  . Dementia Paternal Aunt   . Congestive Heart Failure Paternal Uncle     Ischemic  . Congestive Heart Failure Paternal Uncle     Ischemic    Allergies  Allergen Reactions  . Aspirin Hives  . Chocolate Hives and Other (See Comments)    RUNNY NOSE   . Coffee Bean Extract Hives and Other (See Comments)    RUNNY NOSE   . Ibuprofen Hives and Other (See Comments)    RUNNY NOSE  . Oxycodone Hcl Hives and Nausea And Vomiting  .  Penicillins Hives  . Shrimp [Shellfish Allergy] Hives and Other (See Comments)    RUNNY NOSE   . Sulfonamide Derivatives Hives  . Augmentin [Amoxicillin-Pot Clavulanate] Itching and Rash  . Eggs Or Egg-Derived Products Nausea Only     Current outpatient prescriptions:  .  Acetaminophen (TYLENOL ARTHRITIS EXT RELIEF PO), Take 650 mg by mouth daily., Disp: , Rfl:  .  ADVAIR DISKUS 100-50 MCG/DOSE AEPB, INHALE 1 PUFF TWICE DAILY AT 10 AM AND 5 PM., Disp: 60 each, Rfl: 0 .  albuterol (VENTOLIN HFA) 108 (90 BASE) MCG/ACT inhaler, INHALE 2 PUFFS INTO THE LUNGS EVERY 6 HOURS AS NEEDED FOR SHORTNESS OF BREATH AND WHEEZING., Disp: 18 g, Rfl: 3 .  amLODipine (NORVASC) 5 MG tablet, Take 1 tablet (5 mg total) by mouth daily., Disp: 90 tablet, Rfl: 3 .  anastrozole (ARIMIDEX) 1 MG tablet, Take 1 tablet (1 mg total) by mouth daily., Disp: 90 tablet, Rfl: 3 .  atorvastatin (LIPITOR) 20 MG tablet, Take 1 tablet (20 mg total) by  mouth once. Twice weekly, Disp: 50 tablet, Rfl: 3 .  b complex vitamins tablet, Take 1 tablet by mouth daily. PLUS FOLIC ACID PLUS VIT. C, Disp: , Rfl:  .  Cholecalciferol (VITAMIN D-3) 5000 UNITS TABS, Take 50,000 Units by mouth daily. , Disp: , Rfl:  .  clopidogrel (PLAVIX) 75 MG tablet, TAKE 1 TABLET DAILY., Disp: 90 tablet, Rfl: 3 .  co-enzyme Q-10 50 MG capsule, Take 200 mg by mouth daily., Disp: , Rfl:  .  escitalopram (LEXAPRO) 10 MG tablet, TAKE 1/2 TO 1 TABLET AT BEDTIME., Disp: 90 tablet, Rfl: 0 .  fluticasone (FLONASE) 50 MCG/ACT nasal spray, Place 2 sprays into both nostrils daily., Disp: 16 g, Rfl: 11 .  glucosamine-chondroitin 500-400 MG tablet, Take 1 tablet by mouth 2 (two) times daily. 1500/1200 MG, Disp: , Rfl:  .  hydrochlorothiazide (MICROZIDE) 12.5 MG capsule, TAKE 1 CAPSULE IN THE MORNING AS NEEDED., Disp: 90 capsule, Rfl: 3 .  loratadine (CLARITIN) 10 MG tablet, Take 10 mg by mouth daily.  , Disp: , Rfl:  .  LUTEIN PO, Take 1 tablet by mouth daily. , Disp: , Rfl:  .  metoprolol succinate (TOPROL-XL) 25 MG 24 hr tablet, TAKE 1 TABLET ONCE DAILY WITH A MEAL., Disp: 100 tablet, Rfl: 3 .  ondansetron (ZOFRAN) 4 MG tablet, Take 4 mg by mouth every 8 (eight) hours as needed for nausea or vomiting., Disp: , Rfl:  .  Probiotic Product (ALIGN PO), Take 1 tablet by mouth daily. , Disp: , Rfl:  .  selenium 50 MCG TABS, Take by mouth daily. '200mg'$  qd, Disp: , Rfl:  .  atenolol-chlorthalidone (TENORETIC) 50-25 MG per tablet, Take 1 tablet by mouth daily. Reported on 06/16/2015, Disp: , Rfl:  .  ciprofloxacin (CIPRO) 250 MG tablet, Take 1 tablet (250 mg total) by mouth 2 (two) times daily. (Patient not taking: Reported on 12/08/2014), Disp: 10 tablet, Rfl: 1 .  cyclobenzaprine (FLEXERIL) 5 MG tablet, Take 5 mg by mouth 3 (three) times daily as needed. Reported on 06/16/2015, Disp: , Rfl:  .  HYDROcodone-acetaminophen (NORCO/VICODIN) 5-325 MG per tablet, Reported on 06/16/2015, Disp: , Rfl:  .   meloxicam (MOBIC) 15 MG tablet, TAKE (2) TABLETS DAILY. (Patient not taking: Reported on 12/08/2014), Disp: 180 tablet, Rfl: 3 .  pantoprazole (PROTONIX) 40 MG tablet, TAKE 1 TABLET EACH DAY. (Patient not taking: Reported on 06/16/2015), Disp: 90 tablet, Rfl: 3 .  potassium chloride SA (K-DUR,KLOR-CON) 20  MEQ tablet, TAKE 3 TABLETS DAILY. (Patient not taking: Reported on 06/16/2015), Disp: 270 tablet, Rfl: 3 .  traMADol (ULTRAM) 50 MG tablet, Take 1 tablet (50 mg total) by mouth at bedtime. (Patient not taking: Reported on 06/16/2015), Disp: 30 tablet, Rfl: 5   ROS:   denies chest pain, but does have dyspnea on exertion, orthopnea, leg pain at night, bilateral edema, COPD, history of atrial fibrillation, history of stroke. Otherwise negative review of systems  Objective:  Physical Exam: BP 138/78 mmHg  Pulse 71  Temp(Src) 98 F (36.7 C)  Resp 16  Ht 5' 3.75" (1.619 m)  Wt 152 lb (68.947 kg)  BMI 26.30 kg/m2  SpO2 97%  Gen.-alert and oriented x3 in no apparent distress HEENT normal for age Lungs no rhonchi or wheezing Cardiovascular regular rhythm no murmurs carotid pulses 3+ palpable no bruits audible Abdomen soft nontender no palpable masses Musculoskeletal free of  major deformities Skin clear -no rashes Neurologic normal Lower extremities 3+ femoral and dorsalis pedis pulses palpable bilaterally with  1+ edema bilaterally. Multiple varicosities in the left medial and anterior thigh and in the right medial thigh extending into the left posterior calf. Extensive areas of reticular veins lower third both legs.    today I performed a bedside SonoSite ultrasound exam. Both great saphenous veins are totally closed from previous laser ablation procedures performed in our office.      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 status post laser ablation right great and small saphenous vein and left great saphenous vein in 2009   left  leg pain with swelling  osteoarthritis left knee      Plan:      there is no indication for any further ablation procedures of her saphenous veins Only consideration would be multiple stab phlebectomy of painful varicosities an sclerotherapy   not think that this is causing the pain she is complaining of in her left leg which sounds more like it is related to her left knee osteoarthritis  she is not interested in the other procedures at this time and will return to see [place] on a when necessary basis

## 2015-07-06 ENCOUNTER — Ambulatory Visit: Payer: Self-pay | Admitting: Adult Health

## 2015-07-09 ENCOUNTER — Ambulatory Visit (INDEPENDENT_AMBULATORY_CARE_PROVIDER_SITE_OTHER): Payer: Medicare Other | Admitting: Adult Health

## 2015-07-09 ENCOUNTER — Encounter: Payer: Self-pay | Admitting: Adult Health

## 2015-07-09 VITALS — BP 136/72 | Temp 98.1°F | Wt 148.4 lb

## 2015-07-09 DIAGNOSIS — E785 Hyperlipidemia, unspecified: Secondary | ICD-10-CM | POA: Diagnosis not present

## 2015-07-09 DIAGNOSIS — I10 Essential (primary) hypertension: Secondary | ICD-10-CM

## 2015-07-09 DIAGNOSIS — Z7189 Other specified counseling: Secondary | ICD-10-CM | POA: Diagnosis not present

## 2015-07-09 DIAGNOSIS — F418 Other specified anxiety disorders: Secondary | ICD-10-CM

## 2015-07-09 DIAGNOSIS — Z7689 Persons encountering health services in other specified circumstances: Secondary | ICD-10-CM

## 2015-07-09 DIAGNOSIS — F32A Depression, unspecified: Secondary | ICD-10-CM | POA: Insufficient documentation

## 2015-07-09 DIAGNOSIS — F419 Anxiety disorder, unspecified: Secondary | ICD-10-CM

## 2015-07-09 DIAGNOSIS — F329 Major depressive disorder, single episode, unspecified: Secondary | ICD-10-CM

## 2015-07-09 NOTE — Patient Instructions (Addendum)
It was great meeting you today!  Please follow up with me in September for your complete physical.   If you need anything in the meantime, please let me know.   Try cutting the Lipitor in half and take only 10 mg   Menopause is a normal process in which your reproductive ability comes to an end. This process happens gradually over a span of months to years, usually between the ages of 55 and 74. Menopause is complete when you have missed 12 consecutive menstrual periods. It is important to talk with your health care provider about some of the most common conditions that affect postmenopausal women, such as heart disease, cancer, and bone loss (osteoporosis). Adopting a healthy lifestyle and getting preventive care can help to promote your health and wellness. Those actions can also lower your chances of developing some of these common conditions. WHAT SHOULD I KNOW ABOUT MENOPAUSE? During menopause, you may experience a number of symptoms, such as:  Moderate-to-severe hot flashes.  Night sweats.  Decrease in sex drive.  Mood swings.  Headaches.  Tiredness.  Irritability.  Memory problems.  Insomnia. Choosing to treat or not to treat menopausal changes is an individual decision that you make with your health care provider. WHAT SHOULD I KNOW ABOUT HORMONE REPLACEMENT THERAPY AND SUPPLEMENTS? Hormone therapy products are effective for treating symptoms that are associated with menopause, such as hot flashes and night sweats. Hormone replacement carries certain risks, especially as you become older. If you are thinking about using estrogen or estrogen with progestin treatments, discuss the benefits and risks with your health care provider. WHAT SHOULD I KNOW ABOUT HEART DISEASE AND STROKE? Heart disease, heart attack, and stroke become more likely as you age. This may be due, in part, to the hormonal changes that your body experiences during menopause. These can affect how your body  processes dietary fats, triglycerides, and cholesterol. Heart attack and stroke are both medical emergencies. There are many things that you can do to help prevent heart disease and stroke:  Have your blood pressure checked at least every 1-2 years. High blood pressure causes heart disease and increases the risk of stroke.  If you are 70-72 years old, ask your health care provider if you should take aspirin to prevent a heart attack or a stroke.  Do not use any tobacco products, including cigarettes, chewing tobacco, or electronic cigarettes. If you need help quitting, ask your health care provider.  It is important to eat a healthy diet and maintain a healthy weight.  Be sure to include plenty of vegetables, fruits, low-fat dairy products, and lean protein.  Avoid eating foods that are high in solid fats, added sugars, or salt (sodium).  Get regular exercise. This is one of the most important things that you can do for your health.  Try to exercise for at least 150 minutes each week. The type of exercise that you do should increase your heart rate and make you sweat. This is known as moderate-intensity exercise.  Try to do strengthening exercises at least twice each week. Do these in addition to the moderate-intensity exercise.  Know your numbers.Ask your health care provider to check your cholesterol and your blood glucose. Continue to have your blood tested as directed by your health care provider. WHAT SHOULD I KNOW ABOUT CANCER SCREENING? There are several types of cancer. Take the following steps to reduce your risk and to catch any cancer development as early as possible. Breast Cancer  Practice  breast self-awareness.  This means understanding how your breasts normally appear and feel.  It also means doing regular breast self-exams. Let your health care provider know about any changes, no matter how small.  If you are 58 or older, have a clinician do a breast exam (clinical  breast exam or CBE) every year. Depending on your age, family history, and medical history, it may be recommended that you also have a yearly breast X-ray (mammogram).  If you have a family history of breast cancer, talk with your health care provider about genetic screening.  If you are at high risk for breast cancer, talk with your health care provider about having an MRI and a mammogram every year.  Breast cancer (BRCA) gene test is recommended for women who have family members with BRCA-related cancers. Results of the assessment will determine the need for genetic counseling and BRCA1 and for BRCA2 testing. BRCA-related cancers include these types:  Breast. This occurs in males or females.  Ovarian.  Tubal. This may also be called fallopian tube cancer.  Cancer of the abdominal or pelvic lining (peritoneal cancer).  Prostate.  Pancreatic. Cervical, Uterine, and Ovarian Cancer Your health care provider may recommend that you be screened regularly for cancer of the pelvic organs. These include your ovaries, uterus, and vagina. This screening involves a pelvic exam, which includes checking for microscopic changes to the surface of your cervix (Pap test).  For women ages 21-65, health care providers may recommend a pelvic exam and a Pap test every three years. For women ages 69-65, they may recommend the Pap test and pelvic exam, combined with testing for human papilloma virus (HPV), every five years. Some types of HPV increase your risk of cervical cancer. Testing for HPV may also be done on women of any age who have unclear Pap test results.  Other health care providers may not recommend any screening for nonpregnant women who are considered low risk for pelvic cancer and have no symptoms. Ask your health care provider if a screening pelvic exam is right for you.  If you have had past treatment for cervical cancer or a condition that could lead to cancer, you need Pap tests and screening  for cancer for at least 20 years after your treatment. If Pap tests have been discontinued for you, your risk factors (such as having a new sexual partner) need to be reassessed to determine if you should start having screenings again. Some women have medical problems that increase the chance of getting cervical cancer. In these cases, your health care provider may recommend that you have screening and Pap tests more often.  If you have a family history of uterine cancer or ovarian cancer, talk with your health care provider about genetic screening.  If you have vaginal bleeding after reaching menopause, tell your health care provider.  There are currently no reliable tests available to screen for ovarian cancer. Lung Cancer Lung cancer screening is recommended for adults 65-36 years old who are at high risk for lung cancer because of a history of smoking. A yearly low-dose CT scan of the lungs is recommended if you:  Currently smoke.  Have a history of at least 30 pack-years of smoking and you currently smoke or have quit within the past 15 years. A pack-year is smoking an average of one pack of cigarettes per day for one year. Yearly screening should:  Continue until it has been 15 years since you quit.  Stop if you develop  a health problem that would prevent you from having lung cancer treatment. Colorectal Cancer  This type of cancer can be detected and can often be prevented.  Routine colorectal cancer screening usually begins at age 44 and continues through age 63.  If you have risk factors for colon cancer, your health care provider may recommend that you be screened at an earlier age.  If you have a family history of colorectal cancer, talk with your health care provider about genetic screening.  Your health care provider may also recommend using home test kits to check for hidden blood in your stool.  A small camera at the end of a tube can be used to examine your colon  directly (sigmoidoscopy or colonoscopy). This is done to check for the earliest forms of colorectal cancer.  Direct examination of the colon should be repeated every 5-10 years until age 37. However, if early forms of precancerous polyps or small growths are found or if you have a family history or genetic risk for colorectal cancer, you may need to be screened more often. Skin Cancer  Check your skin from head to toe regularly.  Monitor any moles. Be sure to tell your health care provider:  About any new moles or changes in moles, especially if there is a change in a mole's shape or color.  If you have a mole that is larger than the size of a pencil eraser.  If any of your family members has a history of skin cancer, especially at a young age, talk with your health care provider about genetic screening.  Always use sunscreen. Apply sunscreen liberally and repeatedly throughout the day.  Whenever you are outside, protect yourself by wearing long sleeves, pants, a wide-brimmed hat, and sunglasses. WHAT SHOULD I KNOW ABOUT OSTEOPOROSIS? Osteoporosis is a condition in which bone destruction happens more quickly than new bone creation. After menopause, you may be at an increased risk for osteoporosis. To help prevent osteoporosis or the bone fractures that can happen because of osteoporosis, the following is recommended:  If you are 29-68 years old, get at least 1,000 mg of calcium and at least 600 mg of vitamin D per day.  If you are older than age 28 but younger than age 73, get at least 1,200 mg of calcium and at least 600 mg of vitamin D per day.  If you are older than age 29, get at least 1,200 mg of calcium and at least 800 mg of vitamin D per day. Smoking and excessive alcohol intake increase the risk of osteoporosis. Eat foods that are rich in calcium and vitamin D, and do weight-bearing exercises several times each week as directed by your health care provider. WHAT SHOULD I KNOW  ABOUT HOW MENOPAUSE AFFECTS [place]? Depression may occur at any age, but it is more common as you become older. Common symptoms of depression include:  Low or sad mood.  Changes in sleep patterns.  Changes in appetite or eating patterns.  Feeling an overall lack of motivation or enjoyment of activities that you previously enjoyed.  Frequent crying spells. Talk with your health care provider if you think that you are experiencing depression. WHAT SHOULD I KNOW ABOUT IMMUNIZATIONS? It is important that you get and maintain your immunizations. These include:  Tetanus, diphtheria, and pertussis (Tdap) booster vaccine.  Influenza every year before the flu season begins.  Pneumonia vaccine.  Shingles vaccine. Your health care provider may also recommend other immunizations.  This information is not intended to replace advice given to you by your health care provider. Make sure you discuss any questions you have with your health care provider.   Document Released: 04/22/2005 Document Revised: 03/21/2014 Document Reviewed: 10/31/2013 Elsevier Interactive Patient Education [employer].

## 2015-07-09 NOTE — Progress Notes (Addendum)
Patient presents to clinic today to establish care. 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  Her last physical was in September 2016 with MD Sherren Mocha  Acute Concerns: Establish Care   Chronic Issues: Hypertension  - She feels as though this is well controlled.   Depression/Anxiety  - Feels as though this is well controlled on current medication  Hyperlipidemia - Has been taking 20 mg Lipitor , she takes it twice a week and continues to have leg cramps at night.   Health Maintenance: Dental --Twice a year Vision -- Last year Immunizations -- UTD Colonoscopy -- Cologuard in 2015  Mammogram -- 12/2014  PAP -- No longer needed Bone Density -- 06/2014   Is followed by :  GI - Dr. Collene Mares GYN- Dr. Garwin Brothers On[facility] - [facility]  Vein - Dr. Kellie Simmering  Dermatology - Dr. Ubaldo Glassing  Past Medical History  Diagnosis Date  . Allergy   . Hearing loss     bilateral hearing aids  . Hyperlipidemia   . GERD (gastroesophageal reflux disease)   . COPD (chronic obstructive pulmonary disease) ([place])   . Brainstem infarct, acute ([place]) 03/2011    slight expressive aphasia, occ. problems with balance  . IBS (irritable bowel syndrome)   . History of kidney stones   . History of glomerulonephritis as a child    and had abscess left  kidney  . Traumatic hemopneumothorax 1982    left  . PVD (peripheral vascular disease) ([facility])     varicose veins - left worse than right  . Anxiety   . Depression   . Hypertension     under control, has been on med. since age 70  . Spinal stenosis   . Arthritis     left knee, hands, back  . Palpitations   . Abrasion of finger of left hand 12/29/2011    left middle   . Dental crowns present   . Breast cancer ([place]) 12/2011    right, ER+, PR -, Her 2 -  . Asthma     daily and prn inhalers  . Long term current use of aromatase inhibitor 05/08/2012  . Hx of radiation therapy 03/12/12 -04/13/12    right breast    Past Surgical History  Procedure Laterality Date  . Vein surgery      vein ablation left leg  . Knee surgery  1980s    left  . Orif ankle fracture  1980s    right   . Ankle hardware removal      right  . Tubal ligation  1976  . Cholecystectomy  1990s  . Transobturator sling  07/05/2005  . Cystoscopy  07/05/2005  . Breast biopsy  1976    right  . Breast lumpectomy  2013    right with snbx    Current Outpatient Prescriptions on File Prior to Visit  Medication Sig Dispense Refill  . Acetaminophen (TYLENOL ARTHRITIS EXT RELIEF PO) Take 650 mg by mouth daily.    Marland Kitchen  ADVAIR DISKUS 100-50 MCG/DOSE AEPB INHALE 1 PUFF TWICE DAILY AT 10 AM AND 5 PM. 60 each 0  . albuterol (VENTOLIN HFA) 108 (90 BASE) MCG/ACT inhaler INHALE 2 PUFFS INTO THE LUNGS EVERY 6 HOURS AS NEEDED FOR SHORTNESS OF BREATH AND WHEEZING. 18 g 3  . amLODipine (NORVASC) 5 MG tablet Take 1 tablet (5 mg total) by mouth daily. 90 tablet 3  . anastrozole (ARIMIDEX) 1 MG tablet Take 1 tablet (1 mg total) by mouth daily. 90 tablet 3  . atorvastatin (LIPITOR) 20 MG tablet Take 1 tablet (20 mg total) by mouth once. Twice weekly 50 tablet 3  . b complex vitamins tablet Take 1 tablet by mouth daily. PLUS FOLIC ACID PLUS VIT. C    . Cholecalciferol (VITAMIN D-3) 5000 UNITS TABS Take 50,000 Units by mouth daily.     .  clopidogrel (PLAVIX) 75 MG tablet TAKE 1 TABLET DAILY. 90 tablet 3  . co-enzyme Q-10 50 MG capsule Take 200 mg by mouth daily.    . cyclobenzaprine (FLEXERIL) 5 MG tablet Take 5 mg by mouth 3 (three) times daily as needed. Reported on 06/16/2015    . escitalopram (LEXAPRO) 10 MG tablet TAKE 1/2 TO 1 TABLET AT BEDTIME. 90 tablet 0  . fluticasone (FLONASE) 50 MCG/ACT nasal spray Place 2 sprays into both nostrils daily. 16 g 11  . glucosamine-chondroitin 500-400 MG tablet Take 1 tablet by mouth 2 (two) times daily. 1500/1200 MG    . hydrochlorothiazide (MICROZIDE) 12.5 MG capsule TAKE 1 CAPSULE IN THE MORNING AS NEEDED. 90 capsule 3  . loratadine (CLARITIN) 10 MG tablet Take 10 mg by mouth daily.      . LUTEIN PO Take 1 tablet by mouth daily.     . meloxicam (MOBIC) 15 MG tablet TAKE (2) TABLETS DAILY. 180 tablet 3  . metoprolol succinate (TOPROL-XL) 25 MG 24 hr tablet TAKE 1 TABLET ONCE DAILY WITH A MEAL. 100 tablet 3  . ondansetron (ZOFRAN) 4 MG tablet Take 4 mg by mouth every 8 (eight) hours as needed for nausea or vomiting.    . pantoprazole (PROTONIX) 40 MG tablet TAKE 1 TABLET EACH DAY. 90 tablet 3  . potassium chloride SA (K-DUR,KLOR-CON) 20 MEQ tablet TAKE 3 TABLETS DAILY. 270 tablet 3  . Probiotic Product (ALIGN PO) Take 1 tablet by mouth daily.     Marland Kitchen selenium 50 MCG TABS Take by mouth daily. '200mg'$  qd    . traMADol (ULTRAM) 50 MG tablet Take 1 tablet (50 mg total) by mouth at bedtime. 30 tablet 5  . atenolol-chlorthalidone (TENORETIC) 50-25 MG per tablet Take 1 tablet by mouth daily. Reported on 07/09/2015    . HYDROcodone-acetaminophen (NORCO/VICODIN) 5-325 MG per tablet Reported on 07/09/2015     No current facility-administered medications on file prior to visit.    Allergies  Allergen Reactions  . Aspirin Hives  . Chocolate Hives and Other (See Comments)    RUNNY NOSE   . Coffee Bean Extract Hives and Other (See Comments)    RUNNY NOSE   . Ibuprofen Hives and Other (See  Comments)    RUNNY NOSE  . Oxycodone Hcl Hives and Nausea And Vomiting  . Penicillins Hives  . Shrimp [Shellfish Allergy] Hives and Other (See Comments)    RUNNY NOSE   . Sulfonamide Derivatives Hives  . Augmentin [Amoxicillin-Pot Clavulanate] Itching and Rash  . Eggs Or Egg-Derived Products Nausea Only    Family History  Problem Relation Age of Onset  .  Cancer Maternal Aunt     breast  . Cancer Paternal Aunt     Multiple Myeloma  . Stomach cancer Paternal Uncle   . Breast cancer Paternal Uncle   . Breast cancer Cousin     early 57s  . Breast cancer Paternal Aunt     late 58s-60s  . Heart attack Father     Died age 44  . Heart attack Paternal Grandfather   . Breast cancer Paternal Aunt     bilateral breast cancer  . Dementia Paternal Aunt   . Congestive Heart Failure Paternal Uncle     Ischemic  . Congestive Heart Failure Paternal Uncle     Ischemic    Social History   Social History  . Marital Status: Widowed    Spouse Name: N/A  . Number of Children: N/A  . Years of Education: N/A   Occupational History  . Not on file.   Social History Main Topics  . Smoking status: Former Smoker -- 0.50 packs/day for 15 years    Types: Cigarettes    Quit date: 08/28/1993  . Smokeless tobacco: Never Used  . Alcohol Use: No  . Drug Use: No  . Sexual Activity: Not Currently   Other Topics Concern  . Not on file   Social History Narrative   Cares for her husband who is not in good health.      Review of Systems  Constitutional: Negative.   HENT: Negative.   Eyes: Negative.   Respiratory: Negative.   Cardiovascular: Negative.   Gastrointestinal: Positive for heartburn.  Genitourinary: Negative.   Musculoskeletal: Positive for myalgias, back pain and joint pain.  Skin: Negative.   Neurological: Negative.   Endo/Heme/Allergies: Negative.   Psychiatric/Behavioral: Negative.   All other systems reviewed and are negative.   BP 136/72 mmHg  Temp(Src) 98.1 F  (36.7 C) (Oral)  Wt 148 lb 6.4 oz (67.314 kg)  Physical Exam  Constitutional: She is oriented to person, place, and time and well-developed, well-nourished, and in no distress. No distress.  HENT:  Head: Normocephalic and atraumatic.  Right Ear: External ear normal.  Left Ear: External ear normal.  Nose: Nose normal.  Mouth/Throat: Oropharynx is clear and moist. No oropharyngeal exudate.  Eyes: Conjunctivae and EOM are normal. Pupils are equal, round, and reactive to light. Right eye exhibits no discharge. Left eye exhibits no discharge. No scleral icterus.  Neck: Normal range of motion. Neck supple.  Cardiovascular: Normal rate, regular rhythm, normal heart sounds and intact distal pulses.  Exam reveals no gallop and no friction rub.   No murmur heard. Pulmonary/Chest: Effort normal and breath sounds normal. No respiratory distress. She has no wheezes. She has no rales. She exhibits no tenderness.  Lymphadenopathy:    She has no cervical adenopathy.  Neurological: She is alert and oriented to person, place, and time. She has normal reflexes. Gait normal. GCS score is 15.  Skin: Skin is warm and dry. No rash noted. She is not diaphoretic. No erythema. No pallor.  Varicose veins  Psychiatric: Mood, memory, affect and judgment normal.  Vitals reviewed.    Assessment/Plan: 1. Encounter to establish care - Follow up in September for CPE - Follow up sooner if needed - Work on diet and exericse  2. Essential hypertension, benign - Sub-optimal goal at this visit.  - Will continue to monitor - No change in medication  3. Anxiety and depression - She is alternating Lexapro every other day - 10 mg &  5 mg - No change in medication  4. Hyperlipidemia - Will have her take 10 mg of Lipitor every other day to see if she continues to have leg cramps    [name], NP

## 2015-09-05 ENCOUNTER — Other Ambulatory Visit: Payer: Self-pay | Admitting: Adult Health

## 2015-09-07 NOTE — Telephone Encounter (Signed)
Refill sent to pharmacy.   

## 2015-11-03 ENCOUNTER — Other Ambulatory Visit: Payer: Self-pay | Admitting: Hematology and Oncology

## 2015-11-03 DIAGNOSIS — C50211 Malignant neoplasm of upper-inner quadrant of right female breast: Secondary | ICD-10-CM

## 2015-11-10 NOTE — H&P (Signed)
TOTAL KNEE ADMISSION H&P  Patient is being admitted for left total knee arthroplasty.  Subjective:  Chief Complaint:  Left knee primary OA / pain  HPI: Alexandria [name], 75 y.o. female, has a history of pain and functional disability in the left knee due to trauma and arthritis and has failed non-surgical conservative treatments for greater than 12 weeks to include NSAID's and/or analgesics, corticosteriod injections, viscosupplementation injections, use of assistive devices and activity modification.  Onset of symptoms was gradual, starting >10 years ago with gradually worsening course since that time. The patient noted prior procedures on the knee to include  removal of bone fragments on the lateral aspect of the knee on the left knee(s).  Patient currently rates pain in the left knee(s) at 9 out of 10 with activity. Patient has night pain, worsening of pain with activity and weight bearing, pain that interferes with activities of daily living, pain with passive range of motion, crepitus and joint swelling.  Patient has evidence of periarticular osteophytes and joint space narrowing by imaging studies.  There is no active infection.   Risks, benefits and expectations were discussed with the patient.  Risks including but not limited to the risk of anesthesia, blood clots, nerve damage, blood vessel damage, failure of the prosthesis, infection and up to and including death.  Patient understand the risks, benefits and expectations and wishes to proceed with surgery.   PCP: [name], NP  D/C Plans:      Home  Post-op Meds:       No Rx given  Tranexamic Acid:      To be given - IV   Decadron:      Is to be given  FYI:     Plavix  (On pre-op / No ASA = allergic)  Norco  (states that she can only take for a few days,  states that previously she has done well with demerol)    Patient Active Problem List   Diagnosis Date Noted  . Anxiety and depression 07/09/2015  . Varicose veins of bilateral  lower extremities with other complications 00/92/3300  . Essential hypertension, benign 12/08/2014  . Acute hemorrhagic cystitis 02/18/2014  . Stroke ([place]) 05/15/2012  . Spinal stenosis   . COPD (chronic obstructive pulmonary disease) ([place])   . Long term current use of aromatase inhibitor 05/08/2012  . Hx of radiation therapy   . Breast cancer of upper-inner quadrant of right female breast ([place]) 12/23/2011  . Ataxia 06/23/2011  . Contact dermatitis and eczema due to plant 01/04/2011  . BACK PAIN 08/11/2009  . PALPITATIONS, RECURRENT 02/27/2009  . PERS HX TOBACCO USE PRESENTING HAZARDS HEALTH 12/17/2008  . Hyperlipidemia 02/26/2008  . ASTHMA 02/26/2008  . GERD 02/12/2007   Past Medical History:  Diagnosis Date  . Allergy   . Anxiety   . Arthritis    left knee, hands, back  . Asthma    daily and prn inhalers  . Brainstem infarct, acute ([place]) 03/2011   slight expressive aphasia, occ. problems with balance  . Breast cancer ([place]) 12/2011   right, ER+, PR -, Her 2 -  . COPD (chronic obstructive pulmonary disease) ([place])   . Depression   . GERD (gastroesophageal reflux disease)   . Hearing loss    bilateral hearing aids  . 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 History of kidney stones   . Hx of radiation therapy 03/12/12 -04/13/12   right breast  .  Hyperlipidemia   . Hypertension    under control, has been on med. since age 82  . IBS (irritable bowel syndrome)   . Long term current use of aromatase inhibitor 05/08/2012  . Palpitations   . PVD (peripheral vascular disease) ([facility])    varicose veins - left worse than right  . Spinal stenosis   . Traumatic hemopneumothorax 1982   left    Past Surgical History:  Procedure Laterality Date  . ANKLE HARDWARE REMOVAL     right  . BREAST BIOPSY  1976   right  . BREAST LUMPECTOMY  2013   right with snbx  . CHOLECYSTECTOMY  1990s  . CYSTOSCOPY  07/05/2005  . KNEE SURGERY  1980s   left  . ORIF  ANKLE FRACTURE  1980s   right   . TRANSOBTURATOR SLING  07/05/2005  . TUBAL LIGATION  1976  . VEIN SURGERY     vein ablation left leg    No prescriptions prior to admission.   Allergies  Allergen Reactions  . Aspirin Hives  . Chocolate Hives and Other (See Comments)    RUNNY NOSE   . Coffee Bean Extract Hives and Other (See Comments)    RUNNY NOSE   . Ibuprofen Hives and Other (See Comments)    RUNNY NOSE  . Oxycodone Hcl Hives and Nausea And Vomiting  . Penicillins Hives  . Shrimp [Shellfish Allergy] Hives and Other (See Comments)    RUNNY NOSE   . Sulfonamide Derivatives Hives  . Augmentin [Amoxicillin-Pot Clavulanate] Itching and Rash  . Eggs Or Egg-Derived Products Nausea Only    Social History  Substance Use Topics  . Smoking status: Former Smoker    Packs/day: 0.50    Years: 15.00    Types: Cigarettes    Quit date: 08/28/1993  . Smokeless tobacco: Never Used  . Alcohol use No    Family History  Problem Relation Age of Onset  . Cancer Maternal Aunt     breast  . Cancer Paternal Aunt     Multiple Myeloma  . Stomach cancer Paternal Uncle   . Breast cancer Paternal Uncle   . Breast cancer Cousin     early 44s  . Breast cancer Paternal Aunt     late 18s-60s  . Heart attack Father     Died age 89  . Heart attack Paternal Grandfather   . Breast cancer Paternal Aunt     bilateral breast cancer  . Dementia Paternal Aunt   . Congestive Heart Failure Paternal Uncle     Ischemic  . Congestive Heart Failure Paternal Uncle     Ischemic  . Stroke Mother      Review of Systems  Constitutional: Positive for malaise/fatigue.  HENT: Positive for tinnitus.   Eyes: Negative.   Respiratory: Positive for shortness of breath (with exertion).   Cardiovascular: Negative.   Gastrointestinal: Positive for heartburn.  Genitourinary: Positive for frequency and urgency.  Musculoskeletal: Positive for back pain and joint pain.  Skin: Positive for itching.  Neurological:  Positive for headaches.  Endo/Heme/Allergies: Positive for environmental allergies.  Psychiatric/Behavioral: Positive for depression and memory loss. The patient is nervous/anxious and has insomnia.     Objective:  Physical Exam  Constitutional: She is oriented to person, place, and time. She appears well-developed.  HENT:  Head: Normocephalic.  Eyes: Pupils are equal, round, and reactive to light.  Neck: Neck supple. No JVD present. No tracheal deviation present. No thyromegaly present.  Cardiovascular: Normal rate,  regular rhythm, normal heart sounds and intact distal pulses.   Respiratory: Effort normal and breath sounds normal. No respiratory distress. She has no wheezes.  GI: Soft. There is no tenderness. There is no guarding.  Musculoskeletal:       Left knee: She exhibits decreased range of motion, swelling and bony tenderness. She exhibits no ecchymosis, no deformity, no laceration, no erythema and normal alignment. Tenderness found.  Lymphadenopathy:    She has no cervical adenopathy.  Neurological: She is alert and oriented to person, place, and time.  Skin: Skin is warm and dry.  Psychiatric: She has a normal mood and affect.      Labs:  Estimated body mass index is 25.67 kg/m as calculated from the following:   Height as of 06/16/15: 5' 3.75" (1.619 m).   Weight as of 07/09/15: 67.3 kg (148 lb 6.4 oz).   Imaging Review Plain radiographs demonstrate severe degenerative joint disease of the left knee(s). The overall alignment is neutral. The bone quality appears to be good for age and reported activity level.  Assessment/Plan:  End stage arthritis, left knee   The patient history, physical examination, clinical judgment of the provider and imaging studies are consistent with end stage degenerative joint disease of the left knee(s) and total knee arthroplasty is deemed medically necessary. The treatment options including medical management, injection therapy  arthroscopy and arthroplasty were discussed at length. The risks and benefits of total knee arthroplasty were presented and reviewed. The risks due to aseptic loosening, infection, stiffness, patella tracking problems, thromboembolic complications and other imponderables were discussed. The patient acknowledged the explanation, agreed to proceed with the plan and consent was signed. Patient is being admitted for inpatient treatment for surgery, pain control, PT, OT, prophylactic antibiotics, VTE prophylaxis, progressive ambulation and ADL's and discharge planning. The patient is planning to be discharged home>     [place] Pugh. Korin Hartwell   PA-C  11/10/2015, 10:04 AM

## 2015-11-19 ENCOUNTER — Other Ambulatory Visit: Payer: Self-pay | Admitting: Adult Health

## 2015-11-19 ENCOUNTER — Encounter: Payer: Self-pay | Admitting: Adult Health

## 2015-11-19 ENCOUNTER — Telehealth: Payer: Self-pay | Admitting: Adult Health

## 2015-11-19 ENCOUNTER — Ambulatory Visit (INDEPENDENT_AMBULATORY_CARE_PROVIDER_SITE_OTHER): Payer: Medicare Other | Admitting: Adult Health

## 2015-11-19 VITALS — BP 124/58 | Temp 98.2°F | Ht 63.75 in | Wt 136.2 lb

## 2015-11-19 DIAGNOSIS — I10 Essential (primary) hypertension: Secondary | ICD-10-CM

## 2015-11-19 DIAGNOSIS — E785 Hyperlipidemia, unspecified: Secondary | ICD-10-CM | POA: Diagnosis not present

## 2015-11-19 DIAGNOSIS — Z0001 Encounter for general adult medical examination with abnormal findings: Secondary | ICD-10-CM | POA: Diagnosis not present

## 2015-11-19 DIAGNOSIS — Z0000 Encounter for general adult medical examination without abnormal findings: Secondary | ICD-10-CM

## 2015-11-19 DIAGNOSIS — I4891 Unspecified atrial fibrillation: Secondary | ICD-10-CM | POA: Insufficient documentation

## 2015-11-19 DIAGNOSIS — R001 Bradycardia, unspecified: Secondary | ICD-10-CM

## 2015-11-19 LAB — CBC WITH DIFFERENTIAL/PLATELET
BASOS ABS: 0 10*3/uL (ref 0.0–0.1)
Basophils Relative: 0.8 % (ref 0.0–3.0)
EOS ABS: 0.1 10*3/uL (ref 0.0–0.7)
Eosinophils Relative: 1.5 % (ref 0.0–5.0)
HEMATOCRIT: 39.7 % (ref 36.0–46.0)
HEMOGLOBIN: 13.4 g/dL (ref 12.0–15.0)
LYMPHS PCT: 28.6 % (ref 12.0–46.0)
Lymphs Abs: 1.7 10*3/uL (ref 0.7–4.0)
MCHC: 33.8 g/dL (ref 30.0–36.0)
MCV: 88.5 fl (ref 78.0–100.0)
Monocytes Absolute: 0.5 10*3/uL (ref 0.1–1.0)
Monocytes Relative: 8 % (ref 3.0–12.0)
NEUTROS ABS: 3.7 10*3/uL (ref 1.4–7.7)
Neutrophils Relative %: 61.1 % (ref 43.0–77.0)
PLATELETS: 221 10*3/uL (ref 150.0–400.0)
RBC: 4.48 Mil/uL (ref 3.87–5.11)
RDW: 13.5 % (ref 11.5–15.5)
WBC: 6.1 10*3/uL (ref 4.0–10.5)

## 2015-11-19 LAB — POC URINALSYSI DIPSTICK (AUTOMATED)
GLUCOSE UA: NEGATIVE
Ketones, UA: NEGATIVE
NITRITE UA: NEGATIVE
PH UA: 6
RBC UA: NEGATIVE
Spec Grav, UA: 1.02
UROBILINOGEN UA: 1

## 2015-11-19 LAB — HEPATIC FUNCTION PANEL
ALT: 10 U/L (ref 0–35)
AST: 17 U/L (ref 0–37)
Albumin: 4.3 g/dL (ref 3.5–5.2)
Alkaline Phosphatase: 63 U/L (ref 39–117)
BILIRUBIN DIRECT: 0.2 mg/dL (ref 0.0–0.3)
TOTAL PROTEIN: 6.9 g/dL (ref 6.0–8.3)
Total Bilirubin: 1.5 mg/dL — ABNORMAL HIGH (ref 0.2–1.2)

## 2015-11-19 LAB — BASIC METABOLIC PANEL
BUN: 16 mg/dL (ref 6–23)
CALCIUM: 9.5 mg/dL (ref 8.4–10.5)
CO2: 34 mEq/L — ABNORMAL HIGH (ref 19–32)
CREATININE: 0.84 mg/dL (ref 0.40–1.20)
Chloride: 103 mEq/L (ref 96–112)
GFR: 70.64 mL/min (ref >60.00)
Glucose, Bld: 84 mg/dL (ref 70–99)
Potassium: 4.1 mEq/L (ref 3.5–5.1)
Sodium: 143 mEq/L (ref 135–145)

## 2015-11-19 LAB — TSH: TSH: 3.82 u[IU]/mL (ref 0.35–4.50)

## 2015-11-19 LAB — LIPID PANEL
CHOL/HDL RATIO: 4
Cholesterol: 218 mg/dL — ABNORMAL HIGH (ref 0–200)
HDL: 50.6 mg/dL (ref >39.00)
LDL Cholesterol: 134 mg/dL — ABNORMAL HIGH (ref 0–99)
NonHDL: 167.59
TRIGLYCERIDES: 169 mg/dL — AB (ref 0.0–149.0)
VLDL: 33.8 mg/dL (ref 0.0–40.0)

## 2015-11-19 NOTE — Telephone Encounter (Signed)
Updated patient on her labs  

## 2015-11-19 NOTE — Patient Instructions (Signed)
It was great seeing you today!  I will follow up with you regarding your labs.   Please stop Metoprolol this week. Continue to monitor blood pressure at home   Follow up with me in a week to check your heart rate

## 2015-11-19 NOTE — Progress Notes (Signed)
Subjective:    Patient ID: Alexandria [name], female    DOB: [birth year], 75 y.o.   MRN: [id number]  HPI Patient presents for yearly preventative medicine examination. 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  All immunizations and health maintenance protocols were reviewed with the patient and needed orders were placed.  Medication reconciliation,  past medical history, social history, problem list and allergies were reviewed in detail with the patient  Goals were established with regard to weight loss, exercise, and  diet in compliance with medications. She has been dieting and has been able to lose weight.  Wt Readings from Last 3 Encounters:  11/19/15 136 lb 3.2 oz (61.8 kg)  07/09/15 148 lb 6.4 oz (67.3 kg)  06/16/15 152 lb (68.9 kg)   She plans on having surgery for total left knee on  December 08 2015. She will need to be cleared for surgery.    Review of Systems  Constitutional: Negative.   HENT: Negative.   Eyes: Negative.   Respiratory: Negative.   Cardiovascular: Negative.   Gastrointestinal: Negative.   Endocrine: Negative.   Genitourinary: Negative.   Musculoskeletal: Negative.   Allergic/Immunologic: Negative.   Neurological: Negative.   Hematological: Negative.   Psychiatric/Behavioral: Negative.   All other systems reviewed and are negative.  Past Medical History:  Diagnosis  Date  . Allergy   . Anxiety   . Arthritis    left knee, hands, back  . Asthma    daily and prn inhalers  . Brainstem infarct, acute ([place]) 03/2011   slight expressive aphasia, occ. problems with balance  . Breast cancer ([facility]) 12/2011   right, ER+, PR -, Her 2 -  . COPD (chronic obstructive pulmonary disease) ([place])   . Depression   . GERD (gastroesophageal reflux disease)   . Hearing loss    bilateral hearing aids  . 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 History of kidney stones   . Hx of radiation therapy 03/12/12 -04/13/12   right breast  . Hyperlipidemia   . Hypertension    under control, has been on med. since age 81  . IBS (irritable bowel syndrome)   . Long term current use of aromatase inhibitor 05/08/2012  . Palpitations   . PVD (peripheral vascular disease) ([facility])    varicose veins - left worse than right  . Spinal stenosis   . Traumatic hemopneumothorax 1982   left    Social History   Social History  . Marital status: Widowed    Spouse name: N/A  . Number of children: N/A  . Years of education: N/A   Occupational History  . Not on file.   Social History Main Topics  . Smoking status: Former Smoker    Packs/day: 0.50    Years: 15.00    Types: Cigarettes    Quit date: 08/28/1993  . Smokeless tobacco: Never Used  . Alcohol use No  . Drug use: No  . Sexual activity: Not Currently   Other Topics Concern  .  Not on file   Social History Narrative   Widowed since 2014   Retired Scientist, product/process development    Son and daughter    Past Surgical History:  Procedure Laterality Date  . ANKLE HARDWARE REMOVAL     right  . BREAST BIOPSY  1976   right  . BREAST LUMPECTOMY  2013   right with snbx  . CHOLECYSTECTOMY  1990s  . CYSTOSCOPY  07/05/2005  . KNEE SURGERY  1980s   left  . ORIF ANKLE FRACTURE  1980s   right   . TRANSOBTURATOR SLING  07/05/2005  . TUBAL LIGATION  1976  . VEIN SURGERY     vein ablation left leg    Family  History  Problem Relation Age of Onset  . Cancer Maternal Aunt     breast  . Cancer Paternal Aunt     Multiple Myeloma  . Stomach cancer Paternal Uncle   . Breast cancer Paternal Uncle   . Breast cancer Cousin     early 50s  . Breast cancer Paternal Aunt     late 40s-60s  . Heart attack Father     Died age 16  . Heart attack Paternal Grandfather   . Breast cancer Paternal Aunt     bilateral breast cancer  . Dementia Paternal Aunt   . Congestive Heart Failure Paternal Uncle     Ischemic  . Congestive Heart Failure Paternal Uncle     Ischemic  . Stroke Mother     Allergies  Allergen Reactions  . Aspirin Hives  . Chocolate Hives and Other (See Comments)    RUNNY NOSE   . Coffee Bean Extract Hives and Other (See Comments)    RUNNY NOSE   . Ibuprofen Hives and Other (See Comments)    RUNNY NOSE  . Oxycodone Hcl Hives and Nausea And Vomiting  . Penicillins Hives  . Shrimp [Shellfish Allergy] Hives and Other (See Comments)    RUNNY NOSE   . Sulfonamide Derivatives Hives  . Augmentin [Amoxicillin-Pot Clavulanate] Itching and Rash  . Eggs Or Egg-Derived Products Nausea Only    Current Outpatient Prescriptions on File Prior to Visit  Medication Sig Dispense Refill  . Acetaminophen (TYLENOL ARTHRITIS EXT RELIEF PO) Take 650 mg by mouth daily.    Marland Kitchen ADVAIR DISKUS 100-50 MCG/DOSE AEPB INHALE 1 PUFF TWICE DAILY AT 10 AM AND 5 PM. 60 each 0  . albuterol (VENTOLIN HFA) 108 (90 BASE) MCG/ACT inhaler INHALE 2 PUFFS INTO THE LUNGS EVERY 6 HOURS AS NEEDED FOR SHORTNESS OF BREATH AND WHEEZING. 18 g 3  . amLODipine (NORVASC) 5 MG tablet Take 1 tablet (5 mg total) by mouth daily. 90 tablet 3  . anastrozole (ARIMIDEX) 1 MG tablet TAKE 1 TABLET ONCE DAILY. 90 tablet 3  . atenolol-chlorthalidone (TENORETIC) 50-25 MG per tablet Take 1 tablet by mouth daily. Reported on 07/09/2015    . atorvastatin (LIPITOR) 20 MG tablet Take 1 tablet (20 mg total) by mouth once. Twice weekly 50 tablet 3  .  b complex vitamins tablet Take 1 tablet by mouth daily. PLUS FOLIC ACID PLUS VIT. C    . Cholecalciferol (VITAMIN D-3) 5000 UNITS TABS Take 50,000 Units by mouth daily.     . clopidogrel (PLAVIX) 75 MG tablet TAKE 1 TABLET DAILY. 90 tablet 3  . co-enzyme Q-10 50 MG capsule Take 200 mg by mouth daily.    . cyclobenzaprine (FLEXERIL) 5 MG tablet Take 5 mg by mouth 3 (three) times daily as  needed. Reported on 06/16/2015    . escitalopram (LEXAPRO) 10 MG tablet TAKE 1/2 TO 1 TABLET AT BEDTIME. 90 tablet 0  . fluticasone (FLONASE) 50 MCG/ACT nasal spray Place 2 sprays into both nostrils daily. 16 g 11  . glucosamine-chondroitin 500-400 MG tablet Take 1 tablet by mouth 2 (two) times daily. 1500/1200 MG    . hydrochlorothiazide (MICROZIDE) 12.5 MG capsule TAKE 1 CAPSULE IN THE MORNING AS NEEDED. 90 capsule 3  . HYDROcodone-acetaminophen (NORCO/VICODIN) 5-325 MG per tablet Reported on 07/09/2015    . loratadine (CLARITIN) 10 MG tablet Take 10 mg by mouth daily.      . LUTEIN PO Take 1 tablet by mouth daily.     . meloxicam (MOBIC) 15 MG tablet TAKE (2) TABLETS DAILY. 180 tablet 3  . metoprolol succinate (TOPROL-XL) 25 MG 24 hr tablet TAKE 1 TABLET ONCE DAILY WITH A MEAL. 100 tablet 3  . ondansetron (ZOFRAN) 4 MG tablet Take 4 mg by mouth every 8 (eight) hours as needed for nausea or vomiting.    . pantoprazole (PROTONIX) 40 MG tablet TAKE 1 TABLET EACH DAY. 90 tablet 3  . potassium chloride SA (K-DUR,KLOR-CON) 20 MEQ tablet TAKE 3 TABLETS DAILY. 270 tablet 3  . Probiotic Product (ALIGN PO) Take 1 tablet by mouth daily.     Marland Kitchen selenium 50 MCG TABS Take by mouth daily. 259m qd    . traMADol (ULTRAM) 50 MG tablet Take 1 tablet (50 mg total) by mouth at bedtime. 30 tablet 5   No current facility-administered medications on file prior to visit.     BP (!) 124/58   Temp 98.2 F (36.8 C) (Oral)   Ht 5' 3.75" (1.619 m)   Wt 136 lb 3.2 oz (61.8 kg)   BMI 23.56 kg/m       Objective:   Physical Exam    Constitutional: She is oriented to person, place, and time. She appears well-developed and well-nourished. No distress.  HENT:  Head: Normocephalic and atraumatic.  Right Ear: External ear normal.  Left Ear: External ear normal.  Nose: Nose normal.  Mouth/Throat: Oropharynx is clear and moist. No oropharyngeal exudate.  Eyes: Conjunctivae and EOM are normal. Pupils are equal, round, and reactive to light. Right eye exhibits no discharge. Left eye exhibits no discharge. No scleral icterus.  Neck: Normal range of motion. Neck supple. No JVD present. No tracheal deviation present. No thyromegaly present.  Cardiovascular: Normal rate, regular rhythm, normal heart sounds and intact distal pulses.  Exam reveals no gallop and no friction rub.   No murmur heard. Pulmonary/Chest: Effort normal and breath sounds normal. No stridor. No respiratory distress. She has no wheezes. She has no rales. She exhibits no tenderness.  Abdominal: Soft. Bowel sounds are normal. She exhibits no distension and no mass. There is no tenderness. There is no rebound and no guarding.  Genitourinary:  Genitourinary Comments: Refused breast exam: She had it done at her oncology visit   Musculoskeletal: Normal range of motion. She exhibits edema (left knee). She exhibits no tenderness or deformity.  Lymphadenopathy:    She has no cervical adenopathy.  Neurological: She is alert and oriented to person, place, and time. She has normal reflexes. She displays normal reflexes. No cranial nerve deficit. She exhibits normal muscle tone. Coordination normal.  Skin: Skin is warm and dry. No rash noted. She is not diaphoretic. No erythema. No pallor.  Psychiatric: She has a normal mood and affect. Her behavior is normal. Judgment and  thought content normal.  Nursing note and vitals reviewed.     Assessment & Plan:  1. Routine general medical examination at a health care facility  - EKG 12-Lead- Sinus Loletha Grayer with Negative pre cordial  T waves, Rate 45.  - POCT Urinalysis Dipstick (Automated) - Basic metabolic panel - CBC with Differential/Platelet - Lipid panel - TSH - Hepatic function panel  2. Essential hypertension, benign - Has lost close to 20 pounds - She has stopped Tenoretic 50-48m  - Blood pressure at goal today - EKG 12-Lead - POCT Urinalysis Dipstick (Automated) - Basic metabolic panel - CBC with Differential/Platelet - Lipid panel - TSH - Hepatic function panel  3. Bradycardia - She was found to have a heart rate of 45 during her EKG. I am going to have her stop Metoprolol for the next week to see how her blood pressure and pulse respond.  - Follow up in one week or sooner if needed - Advised to monitor BP carefully at home and return precautions given   4. Hyperlipidemia  - EKG 12-Lead - POCT Urinalysis Dipstick (Automated) - Basic metabolic panel - CBC with Differential/Platelet - Lipid panel - TSH - Hepatic function panel  CDorothyann Peng NP

## 2015-11-25 ENCOUNTER — Encounter (HOSPITAL_COMMUNITY): Payer: Self-pay

## 2015-11-26 ENCOUNTER — Encounter: Payer: Self-pay | Admitting: Adult Health

## 2015-11-26 ENCOUNTER — Ambulatory Visit (INDEPENDENT_AMBULATORY_CARE_PROVIDER_SITE_OTHER): Payer: Medicare Other | Admitting: Adult Health

## 2015-11-26 VITALS — BP 142/52 | Temp 98.6°F | Ht 63.75 in | Wt 141.8 lb

## 2015-11-26 DIAGNOSIS — Z01818 Encounter for other preprocedural examination: Secondary | ICD-10-CM | POA: Diagnosis not present

## 2015-11-26 DIAGNOSIS — R001 Bradycardia, unspecified: Secondary | ICD-10-CM

## 2015-11-26 DIAGNOSIS — I10 Essential (primary) hypertension: Secondary | ICD-10-CM | POA: Diagnosis not present

## 2015-11-26 MED ORDER — AMLODIPINE BESYLATE 10 MG PO TABS: 5.0000 mg | ORAL_TABLET | Freq: Every day | ORAL | 1 refills | Status: DC

## 2015-11-26 NOTE — Progress Notes (Signed)
 Subjective:    Patient ID: Alexandria [name], female    DOB: 03/17/1940, 75 y.o.   MRN: [id number]  HPI  75 year old female who presents to the office today for one week follow up regarding asymptomatic bradycardia. I saw her last week for her physical and surgical clearance exam . At that time a EKG was done and she was found to have a heart rate of 45. I had her stop metoprolol and monitor blood pressure and rate at home.   Per her log, pulses have been in the 60-70's and blood pressures between 124-144 systolic/70's.   Today in the office she reports that she went through 2-3 days of palpitations and feeling " foggy" but that this has since resolved.    Review of Systems  Constitutional: Negative.   HENT: Negative.   Eyes: Negative.   Respiratory: Negative.   Cardiovascular: Positive for palpitations. Negative for chest pain and leg swelling.  Gastrointestinal: Negative.   Neurological: Negative.   All other systems reviewed and are negative.  Past Medical History:  Diagnosis Date  . Allergy   . Anxiety   . Arthritis    left knee, hands, back  . Asthma    daily and prn inhalers  . 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 problems with balance  . Breast cancer (HCC) 12/2011   right, ER+, PR -, Her 2 -  . COPD (chronic obstructive pulmonary disease) (HCC)   . Depression   . GERD (gastroesophageal reflux disease)   . Hearing loss    bilateral hearing aids  . 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 History of kidney stones   . Hx of radiation therapy 03/12/12 -04/13/12   right breast  . Hyperlipidemia   . Hypertension    under control, has been on med. since age 44  . IBS (irritable bowel syndrome)   . Long term current use of aromatase inhibitor 05/08/2012  . Palpitations   . PVD (peripheral vascular disease) (HCC)    varicose veins - left worse than right  . Spinal stenosis   . Traumatic  hemopneumothorax 1982   left    Social History   Social History  . Marital status: Widowed    Spouse name: N/A  . Number of children: N/A  . Years of education: N/A   Occupational History  . Not on file.   Social History Main Topics  . Smoking status: Former Smoker    Packs/day: 0.50    Years: 15.00    Types: Cigarettes    Quit date: 08/28/1993  . Smokeless tobacco: Never Used  . Alcohol use No  . Drug use: No  . Sexual activity: Not Currently   Other Topics Concern  . Not on file   Social History Narrative   Widowed since 2014   Retired psych nurse    Son and daughter    Past Surgical History:  Procedure Laterality Date  . ANKLE HARDWARE REMOVAL     right  . BREAST BIOPSY  1976   right  . BREAST LUMPECTOMY  2013   right with snbx  . CHOLECYSTECTOMY  1990s  . CYSTOSCOPY  07/05/2005  . KNEE SURGERY  1980s   left  . ORIF ANKLE FRACTURE  1980s   right   . TRANSOBTURATOR SLING  07/05/2005  . TUBAL LIGATION  1976  . VEIN SURGERY     vein ablation   left leg    Family History  Problem Relation Age of Onset  . Cancer Maternal Aunt     breast  . Cancer Paternal Aunt     Multiple Myeloma  . Stomach cancer Paternal Uncle   . Breast cancer Paternal Uncle   . Breast cancer Cousin     early 40s  . Breast cancer Paternal Aunt     late 50s-60s  . Stroke Mother   . Heart attack Father     Died age 59  . Heart attack Paternal Grandfather   . Breast cancer Paternal Aunt     bilateral breast cancer  . Dementia Paternal Aunt   . Congestive Heart Failure Paternal Uncle     Ischemic  . Congestive Heart Failure Paternal Uncle     Ischemic    Allergies  Allergen Reactions  . Aspirin Hives  . Chocolate Hives and Other (See Comments)    RUNNY NOSE   . Coffee Bean Extract Hives and Other (See Comments)    RUNNY NOSE   . Ibuprofen Hives and Other (See Comments)    RUNNY NOSE  . Oxycodone Hcl Hives and Nausea And Vomiting  . Penicillins Hives  . Shrimp  [Shellfish Allergy] Hives and Other (See Comments)    RUNNY NOSE   . Sulfonamide Derivatives Hives  . Augmentin [Amoxicillin-Pot Clavulanate] Itching and Rash  . Eggs Or Egg-Derived Products Nausea Only    Current Outpatient Prescriptions on File Prior to Visit  Medication Sig Dispense Refill  . Acetaminophen (TYLENOL ARTHRITIS EXT RELIEF PO) Take 650 mg by mouth daily.    . ADVAIR DISKUS 100-50 MCG/DOSE AEPB INHALE 1 PUFF TWICE DAILY AT 10 AM AND 5 PM. 60 each 0  . albuterol (VENTOLIN HFA) 108 (90 BASE) MCG/ACT inhaler INHALE 2 PUFFS INTO THE LUNGS EVERY 6 HOURS AS NEEDED FOR SHORTNESS OF BREATH AND WHEEZING. 18 g 3  . amLODipine (NORVASC) 5 MG tablet Take 1 tablet (5 mg total) by mouth daily. 90 tablet 3  . anastrozole (ARIMIDEX) 1 MG tablet TAKE 1 TABLET ONCE DAILY. 90 tablet 3  . atorvastatin (LIPITOR) 20 MG tablet Take 1 tablet (20 mg total) by mouth once. Twice weekly (Patient taking differently: Take 10 mg by mouth once. Twice weekly) 50 tablet 3  . b complex vitamins tablet Take 1 tablet by mouth daily. PLUS FOLIC ACID PLUS VIT. C    . Cholecalciferol (VITAMIN D-3) 5000 UNITS TABS Take 50,000 Units by mouth daily.     . clopidogrel (PLAVIX) 75 MG tablet TAKE 1 TABLET DAILY. 90 tablet 3  . co-enzyme Q-10 50 MG capsule Take 200 mg by mouth daily.    . escitalopram (LEXAPRO) 10 MG tablet TAKE 1/2 TO 1 TABLET AT BEDTIME. 90 tablet 0  . fluticasone (FLONASE) 50 MCG/ACT nasal spray Place 2 sprays into both nostrils daily. 16 g 11  . glucosamine-chondroitin 500-400 MG tablet Take 1 tablet by mouth 2 (two) times daily. 1500/1200 MG    . hydrochlorothiazide (MICROZIDE) 12.5 MG capsule TAKE 1 CAPSULE IN THE MORNING AS NEEDED. 90 capsule 3  . HYDROcodone-acetaminophen (NORCO/VICODIN) 5-325 MG per tablet Reported on 07/09/2015    . loratadine (CLARITIN) 10 MG tablet Take 10 mg by mouth daily.      . ondansetron (ZOFRAN) 4 MG tablet Take 4 mg by mouth every 8 (eight) hours as needed for nausea or  vomiting.    . pantoprazole (PROTONIX) 40 MG tablet TAKE 1 TABLET EACH DAY. 90 tablet 3  .   potassium chloride SA (K-DUR,KLOR-CON) 20 MEQ tablet TAKE 3 TABLETS DAILY. 270 tablet 3  . Probiotic Product (ALIGN PO) Take 1 tablet by mouth daily.     . selenium 50 MCG TABS Take by mouth daily. 200mg qd     No current facility-administered medications on file prior to visit.     BP (!) 142/52   Temp 98.6 F (37 C) (Oral)   Ht 5' 3.75" (1.619 m)   Wt 141 lb 12.8 oz (64.3 kg)   BMI 24.53 kg/m      Objective:   Physical Exam  Constitutional: She appears well-developed and well-nourished. No distress.  Cardiovascular: Normal rate, regular rhythm and intact distal pulses.  Exam reveals no gallop and no friction rub.   No murmur heard. Pulmonary/Chest: Effort normal and breath sounds normal.  Abdominal: Soft. Bowel sounds are normal. She exhibits no distension. There is no tenderness. There is no rebound and no guarding.  Skin: Skin is warm and dry. No rash noted. She is not diaphoretic. No erythema. No pallor.  Psychiatric: She has a normal mood and affect. Her behavior is normal. Judgment and thought content normal.  Nursing note and vitals reviewed.     Assessment & Plan:  1. Bradycardia - EKG 12-Lead - Sinus Brady- Rate 59.  - stay off metoprolol  - inform provider if palpations or CP returns  2. Pre-operative clearance - EKG 12-Lead - Stop Plavix 5 days prior to surgery  3. Essential hypertension, benign - Increase Norvasc from 5 mg to 10 mg  - amLODipine (NORVASC) 10 MG tablet; Take 0.5 tablets (5 mg total) by mouth daily.  Dispense: 90 tablet; Refill: 1 - Continue to monitor at home and inform me if BP going up   Cory Nafziger, NP  

## 2015-11-26 NOTE — Patient Instructions (Signed)
JULEIGH KAPLA  11/26/2015   Your procedure is scheduled on: 12/08/2015    Report to [facility] Main  Entrance take [place]  elevators to 3rd floor to  [place] at    0700 AM.  Call this number if you have problems the morning of surgery [phone]   Remember: ONLY 1 PERSON MAY GO WITH YOU TO SHORT STAY TO GET  READY MORNING OF [place].  Do not eat food or drink liquids :After Midnight.     Take these medicines the morning of surgery with A SIP OF WATER: Advair Inhaler and bring, Albuterol Inhaler if needed and bring, Amlodipine ( NOrvasc), Flonase, Claritin, Protonix                                  You may not have any metal on your body including hair pins and              piercings  Do not wear jewelry, make-up, lotions, powders or perfumes, deodorant             Do not wear nail polish.  Do not shave  48 hours prior to surgery.                Do not bring valuables to the hospital. [place].  Contacts, dentures or bridgework may not be worn into surgery.  Leave suitcase in the car. After surgery it may be brought to your room.     Special Instructions:               Please read over the following fact sheets you were given: _____________________________________________________________________             [facility] - Preparing for Surgery Before surgery, you can play an important role.  Because skin is not sterile, your skin needs to be as free of germs as possible.  You can reduce the number of germs on your skin by washing with CHG (chlorahexidine gluconate) soap before surgery.  CHG is an antiseptic cleaner which kills germs and bonds with the skin to continue killing germs even after washing. Please DO NOT use if you have an allergy to CHG or antibacterial soaps.  If your skin becomes reddened/irritated stop using the CHG and inform your nurse when you arrive at Short Stay. Do not  shave (including legs and underarms) for at least 48 hours prior to the first CHG shower.  You may shave your face/neck. Please follow these instructions carefully:  1.  Shower with CHG Soap the night before surgery and the  morning of Surgery.  2.  If you choose to wash your hair, wash your hair first as usual with your  normal  shampoo.  3.  After you shampoo, rinse your hair and body thoroughly to remove the  shampoo.                           4.  Use CHG as you would any other liquid soap.  You can apply chg directly  to the skin and wash  Gently with a scrungie or clean washcloth.  5.  Apply the CHG Soap to your body ONLY FROM THE NECK DOWN.   Do not use on face/ open                           Wound or open sores. Avoid contact with eyes, ears mouth and genitals (private parts).                       Wash face,  Genitals (private parts) with your normal soap.             6.  Wash thoroughly, paying special attention to the area where your surgery  will be performed.  7.  Thoroughly rinse your body with warm water from the neck down.  8.  DO NOT shower/wash with your normal soap after using and rinsing off  the CHG Soap.                9.  Pat yourself dry with a clean towel.            10.  Wear clean pajamas.            11.  Place clean sheets on your bed the night of your first shower and do not  sleep with pets. Day of Surgery : Do not apply any lotions/deodorants the morning of surgery.  Please wear clean clothes to the hospital/surgery center.  FAILURE TO FOLLOW THESE INSTRUCTIONS MAY RESULT IN THE CANCELLATION OF YOUR SURGERY PATIENT SIGNATURE_________________________________  NURSE SIGNATURE__________________________________  ________________________________________________________________________  WHAT IS A BLOOD TRANSFUSION? Blood Transfusion Information  A transfusion is the replacement of blood or some of its parts. Blood is made up of multiple cells  which provide different functions.  Red blood cells carry oxygen and are used for blood loss replacement.  White blood cells fight against infection.  Platelets control bleeding.  Plasma helps clot blood.  Other blood products are available for specialized needs, such as hemophilia or other clotting disorders. BEFORE THE TRANSFUSION  Who gives blood for transfusions?   Healthy volunteers who are fully evaluated to make sure their blood is safe. This is blood bank blood. Transfusion therapy is the safest it has ever been in the practice of medicine. Before blood is taken from a donor, a complete history is taken to make sure that person has no history of diseases nor engages in risky social behavior (examples are intravenous drug use or sexual activity with multiple partners). The donor's travel history is screened to minimize risk of transmitting infections, such as malaria. The donated blood is tested for signs of infectious diseases, such as HIV and hepatitis. The blood is then tested to be sure it is compatible with you in order to minimize the chance of a transfusion reaction. If you or a relative donates blood, this is often done in anticipation of surgery and is not appropriate for emergency situations. It takes many days to process the donated blood. RISKS AND COMPLICATIONS Although transfusion therapy is very safe and saves many lives, the main dangers of transfusion include:   Getting an infectious disease.  Developing a transfusion reaction. This is an allergic reaction to something in the blood you were given. Every precaution is taken to prevent this. The decision to have a blood transfusion has been considered carefully by your caregiver before blood is given. Blood is not given unless the benefits outweigh  the risks. AFTER THE TRANSFUSION  Right after receiving a blood transfusion, you will usually feel much better and more energetic. This is especially true if your red blood  cells have gotten low (anemic). The transfusion raises the level of the red blood cells which carry oxygen, and this usually causes an energy increase.  The nurse administering the transfusion will monitor you carefully for complications. HOME CARE INSTRUCTIONS  No special instructions are needed after a transfusion. You may find your energy is better. Speak with your caregiver about any limitations on activity for underlying diseases you may have. SEEK MEDICAL CARE IF:   Your condition is not improving after your transfusion.  You develop redness or irritation at the intravenous (IV) site. SEEK IMMEDIATE MEDICAL CARE IF:  Any of the following symptoms occur over the next 12 hours:  Shaking chills.  You have a temperature by mouth above 102 F (38.9 C), not controlled by medicine.  Chest, back, or muscle pain.  People around you feel you are not acting correctly or are confused.  Shortness of breath or difficulty breathing.  Dizziness and fainting.  You get a rash or develop hives.  You have a decrease in urine output.  Your urine turns a dark color or changes to pink, red, or brown. Any of the following symptoms occur over the next 10 days:  You have a temperature by mouth above 102 F (38.9 C), not controlled by medicine.  Shortness of breath.  Weakness after normal activity.  The white part of the eye turns yellow (jaundice).  You have a decrease in the amount of urine or are urinating less often.  Your urine turns a dark color or changes to pink, red, or brown. Document Released: 02/26/2000 Document Revised: 05/23/2011 Document Reviewed: 10/15/2007 Exit[employer] Patient Information 2014 [place].  _______________________________________________________________________  Incentive Spirometer  An incentive spirometer is a tool that can help keep your lungs clear and active. This tool measures how well you are filling your lungs with each breath. Taking long deep  breaths may help reverse or decrease the chance of developing breathing (pulmonary) problems (especially infection) following:  A long period of time when you are unable to move or be active. BEFORE THE PROCEDURE   If the spirometer includes an indicator to show your best effort, your nurse or respiratory therapist will set it to a desired goal.  If possible, sit up straight or lean slightly forward. Try not to slouch.  Hold the incentive spirometer in an upright position. INSTRUCTIONS FOR USE  1. Sit on the edge of your bed if possible, or sit up as far as you can in bed or on a chair. 2. Hold the incentive spirometer in an upright position. 3. Breathe out normally. 4. Place the mouthpiece in your mouth and seal your lips tightly around it. 5. Breathe in slowly and as deeply as possible, raising the piston or the ball toward the top of the column. 6. Hold your breath for 3-5 seconds or for as long as possible. Allow the piston or ball to fall to the bottom of the column. 7. Remove the mouthpiece from your mouth and breathe out normally. 8. Rest for a few seconds and repeat Steps 1 through 7 at least 10 times every 1-2 hours when you are awake. Take your time and take a few normal breaths between deep breaths. 9. The spirometer may include an indicator to show your best effort. Use the indicator as a goal to work  toward during each repetition. 10. After each set of 10 deep breaths, practice coughing to be sure your lungs are clear. If you have an incision (the cut made at the time of surgery), support your incision when coughing by placing a pillow or rolled up towels firmly against it. Once you are able to get out of bed, walk around indoors and cough well. You may stop using the incentive spirometer when instructed by your caregiver.  RISKS AND COMPLICATIONS  Take your time so you do not get dizzy or light-headed.  If you are in pain, you may need to take or ask for pain medication before  doing incentive spirometry. It is harder to take a deep breath if you are having pain. AFTER USE  Rest and breathe slowly and easily.  It can be helpful to keep track of a log of your progress. Your caregiver can provide you with a simple table to help with this. If you are using the spirometer at home, follow these instructions: [place] IF:   You are having difficultly using the spirometer.  You have trouble using the spirometer as often as instructed.  Your pain medication is not giving enough relief while using the spirometer.  You develop fever of 100.5 F (38.1 C) or higher. SEEK IMMEDIATE MEDICAL CARE IF:   You cough up bloody sputum that had not been present before.  You develop fever of 102 F (38.9 C) or greater.  You develop worsening pain at or near the incision site. MAKE SURE YOU:   Understand these instructions.  Will watch your condition.  Will get help right away if you are not doing well or get worse. Document Released: 07/11/2006 Document Revised: 05/23/2011 Document Reviewed: 09/11/2006 [facility] Patient Information 2014 [place], [place].   ________________________________________________________________________

## 2015-11-27 ENCOUNTER — Encounter: Payer: Self-pay | Admitting: Adult Health

## 2015-11-30 ENCOUNTER — Encounter (HOSPITAL_COMMUNITY): Payer: Self-pay

## 2015-11-30 ENCOUNTER — Encounter (HOSPITAL_COMMUNITY)
Admission: RE | Admit: 2015-11-30 | Discharge: 2015-11-30 | Disposition: A | Discharge status: Home / Self Care | Payer: Medicare Other | Source: Ambulatory Visit | Attending: Orthopedic Surgery | Admitting: Orthopedic Surgery

## 2015-11-30 DIAGNOSIS — M25562 Pain in left knee: Secondary | ICD-10-CM | POA: Diagnosis not present

## 2015-11-30 DIAGNOSIS — Z01812 Encounter for preprocedural laboratory examination: Secondary | ICD-10-CM | POA: Diagnosis present

## 2015-11-30 DIAGNOSIS — M1712 Unilateral primary osteoarthritis, left knee: Secondary | ICD-10-CM | POA: Diagnosis not present

## 2015-11-30 HISTORY — DX: Cardiac arrhythmia, unspecified: I49.9

## 2015-11-30 HISTORY — DX: Pneumonia, unspecified organism: J18.9

## 2015-11-30 HISTORY — DX: Anemia, unspecified: D64.9

## 2015-11-30 HISTORY — DX: Overactive bladder: N32.81

## 2015-11-30 HISTORY — DX: Stress incontinence (female) (male): N39.3

## 2015-11-30 HISTORY — DX: Spondylosis, unspecified: M47.9

## 2015-11-30 HISTORY — DX: Cerebral infarction, unspecified: I63.9

## 2015-11-30 LAB — SURGICAL PCR SCREEN
MRSA, PCR: NEGATIVE
Staphylococcus aureus: NEGATIVE

## 2015-11-30 LAB — ABO/RH: ABO/RH(D): A POS

## 2015-11-30 NOTE — Progress Notes (Addendum)
EKG- 11/26/15- EPIC 11/19/2015- labs of CBC/DIFF/BMP and HEP FCT PANEL- in EPIC  Clearance- [facility], NP-11/26/2015 on chart

## 2015-11-30 NOTE — Progress Notes (Signed)
Temp 99.1 at preop appointment.  Patient asymptomatic.

## 2015-12-08 ENCOUNTER — Inpatient Hospital Stay (HOSPITAL_COMMUNITY): Payer: Medicare Other | Admitting: Anesthesiology

## 2015-12-08 ENCOUNTER — Inpatient Hospital Stay (HOSPITAL_COMMUNITY)
Admission: RE | Admit: 2015-12-08 | Discharge: 2015-12-10 | DRG: 470 | Disposition: A | Discharge status: Home Health | Payer: Medicare Other | Source: Ambulatory Visit | Attending: Orthopedic Surgery | Admitting: Orthopedic Surgery

## 2015-12-08 ENCOUNTER — Encounter (HOSPITAL_COMMUNITY)
Admission: RE | Disposition: A | Discharge status: Home Health | Payer: Self-pay | Source: Ambulatory Visit | Attending: Orthopedic Surgery

## 2015-12-08 ENCOUNTER — Encounter (HOSPITAL_COMMUNITY): Payer: Self-pay

## 2015-12-08 DIAGNOSIS — Z91012 Allergy to eggs: Secondary | ICD-10-CM

## 2015-12-08 DIAGNOSIS — Z886 Allergy status to analgesic agent status: Secondary | ICD-10-CM | POA: Diagnosis not present

## 2015-12-08 DIAGNOSIS — Z8249 Family history of ischemic heart disease and other diseases of the circulatory system: Secondary | ICD-10-CM

## 2015-12-08 DIAGNOSIS — Z91013 Allergy to seafood: Secondary | ICD-10-CM | POA: Diagnosis not present

## 2015-12-08 DIAGNOSIS — J449 Chronic obstructive pulmonary disease, unspecified: Secondary | ICD-10-CM | POA: Diagnosis present

## 2015-12-08 DIAGNOSIS — Z885 Allergy status to narcotic agent status: Secondary | ICD-10-CM

## 2015-12-08 DIAGNOSIS — F329 Major depressive disorder, single episode, unspecified: Secondary | ICD-10-CM | POA: Diagnosis present

## 2015-12-08 DIAGNOSIS — Z880 Allergy status to penicillin: Secondary | ICD-10-CM

## 2015-12-08 DIAGNOSIS — I69320 Aphasia following cerebral infarction: Secondary | ICD-10-CM | POA: Diagnosis not present

## 2015-12-08 DIAGNOSIS — Z881 Allergy status to other antibiotic agents status: Secondary | ICD-10-CM | POA: Diagnosis not present

## 2015-12-08 DIAGNOSIS — Z974 Presence of external hearing-aid: Secondary | ICD-10-CM | POA: Diagnosis not present

## 2015-12-08 DIAGNOSIS — Z853 Personal history of malignant neoplasm of breast: Secondary | ICD-10-CM | POA: Diagnosis not present

## 2015-12-08 DIAGNOSIS — Z87891 Personal history of nicotine dependence: Secondary | ICD-10-CM | POA: Diagnosis not present

## 2015-12-08 DIAGNOSIS — H918X3 Other specified hearing loss, bilateral: Secondary | ICD-10-CM | POA: Diagnosis present

## 2015-12-08 DIAGNOSIS — E785 Hyperlipidemia, unspecified: Secondary | ICD-10-CM | POA: Diagnosis present

## 2015-12-08 DIAGNOSIS — Z923 Personal history of irradiation: Secondary | ICD-10-CM | POA: Diagnosis not present

## 2015-12-08 DIAGNOSIS — Z91018 Allergy to other foods: Secondary | ICD-10-CM

## 2015-12-08 DIAGNOSIS — K219 Gastro-esophageal reflux disease without esophagitis: Secondary | ICD-10-CM | POA: Diagnosis not present

## 2015-12-08 DIAGNOSIS — M1712 Unilateral primary osteoarthritis, left knee: Principal | ICD-10-CM | POA: Diagnosis present

## 2015-12-08 DIAGNOSIS — I10 Essential (primary) hypertension: Secondary | ICD-10-CM | POA: Diagnosis not present

## 2015-12-08 DIAGNOSIS — K589 Irritable bowel syndrome without diarrhea: Secondary | ICD-10-CM | POA: Diagnosis present

## 2015-12-08 DIAGNOSIS — I739 Peripheral vascular disease, unspecified: Secondary | ICD-10-CM | POA: Diagnosis not present

## 2015-12-08 DIAGNOSIS — F419 Anxiety disorder, unspecified: Secondary | ICD-10-CM | POA: Diagnosis present

## 2015-12-08 DIAGNOSIS — Z803 Family history of malignant neoplasm of breast: Secondary | ICD-10-CM | POA: Diagnosis not present

## 2015-12-08 DIAGNOSIS — Z823 Family history of stroke: Secondary | ICD-10-CM

## 2015-12-08 DIAGNOSIS — Z96659 Presence of unspecified artificial knee joint: Secondary | ICD-10-CM

## 2015-12-08 HISTORY — PX: TOTAL KNEE ARTHROPLASTY: SHX125

## 2015-12-08 LAB — TYPE AND SCREEN
ABO/RH(D): A POS
Antibody Screen: NEGATIVE

## 2015-12-08 SURGERY — ARTHROPLASTY, KNEE, TOTAL
Anesthesia: Regional | Site: Knee | Laterality: Left

## 2015-12-08 MED ORDER — MENTHOL 3 MG MT LOZG: 1.0000 | LOZENGE | OROMUCOSAL | Status: DC | PRN

## 2015-12-08 MED ORDER — AMLODIPINE BESYLATE 10 MG PO TABS
10.0000 mg | ORAL_TABLET | Freq: Every day | ORAL | Status: DC
  Filled 2015-12-08: qty 1

## 2015-12-08 MED ORDER — ACETAMINOPHEN 325 MG PO TABS: 650.0000 mg | ORAL_TABLET | Freq: Four times a day (QID) | ORAL | Status: DC | PRN

## 2015-12-08 MED ORDER — METHOCARBAMOL 1000 MG/10ML IJ SOLN
500.0000 mg | Freq: Four times a day (QID) | INTRAVENOUS | Status: DC | PRN
  Administered 2015-12-08: 500 mg via INTRAVENOUS
  Filled 2015-12-08: qty 5
  Filled 2015-12-08: qty 550

## 2015-12-08 MED ORDER — LIDOCAINE 2% (20 MG/ML) 5 ML SYRINGE
INTRAMUSCULAR | Status: DC | PRN
  Administered 2015-12-08: 100 mg via INTRAVENOUS

## 2015-12-08 MED ORDER — BUPIVACAINE HCL (PF) 0.5 % IJ SOLN
INTRAMUSCULAR | Status: AC
  Filled 2015-12-08: qty 30

## 2015-12-08 MED ORDER — SODIUM CHLORIDE 0.9 % IV SOLN
1000.0000 mg | Freq: Once | INTRAVENOUS | Status: AC
  Administered 2015-12-08: 1000 mg via INTRAVENOUS
  Filled 2015-12-08: qty 10

## 2015-12-08 MED ORDER — POLYETHYLENE GLYCOL 3350 17 G PO PACK: 17.0000 g | PACK | Freq: Every day | ORAL | Status: DC | PRN

## 2015-12-08 MED ORDER — VITAMIN D3 25 MCG (1000 UNIT) PO TABS
5000.0000 [IU] | ORAL_TABLET | Freq: Every day | ORAL | Status: DC
  Administered 2015-12-10: 5000 [IU] via ORAL
  Filled 2015-12-08: qty 5

## 2015-12-08 MED ORDER — FLUTICASONE PROPIONATE 50 MCG/ACT NA SUSP
2.0000 | Freq: Every day | NASAL | Status: DC
  Administered 2015-12-10: 2 via NASAL
  Filled 2015-12-08: qty 16

## 2015-12-08 MED ORDER — ONDANSETRON HCL 4 MG/2ML IJ SOLN: 4.0000 mg | Freq: Four times a day (QID) | INTRAMUSCULAR | Status: DC | PRN

## 2015-12-08 MED ORDER — FENTANYL CITRATE (PF) 100 MCG/2ML IJ SOLN
INTRAMUSCULAR | Status: AC
  Filled 2015-12-08: qty 2

## 2015-12-08 MED ORDER — ESCITALOPRAM OXALATE 10 MG PO TABS
10.0000 mg | ORAL_TABLET | Freq: Every day | ORAL | Status: DC
  Administered 2015-12-09 – 2015-12-10 (×2): 10 mg via ORAL
  Filled 2015-12-08 (×2): qty 1

## 2015-12-08 MED ORDER — MOMETASONE FURO-FORMOTEROL FUM 100-5 MCG/ACT IN AERO
2.0000 | INHALATION_SPRAY | Freq: Two times a day (BID) | RESPIRATORY_TRACT | Status: DC
  Administered 2015-12-08 – 2015-12-10 (×4): 2 via RESPIRATORY_TRACT
  Filled 2015-12-08: qty 8.8

## 2015-12-08 MED ORDER — MIDAZOLAM HCL 2 MG/2ML IJ SOLN
INTRAMUSCULAR | Status: AC
  Filled 2015-12-08: qty 2

## 2015-12-08 MED ORDER — HYDROCODONE-ACETAMINOPHEN 7.5-325 MG PO TABS: 1.0000 | ORAL_TABLET | ORAL | 0 refills | Status: DC | PRN

## 2015-12-08 MED ORDER — FERROUS SULFATE 325 (65 FE) MG PO TABS
325.0000 mg | ORAL_TABLET | Freq: Two times a day (BID) | ORAL | Status: DC
Start: 2015-12-08 — End: 2015-12-10
  Administered 2015-12-09 – 2015-12-10 (×2): 325 mg via ORAL
  Filled 2015-12-08 (×2): qty 1

## 2015-12-08 MED ORDER — ONDANSETRON HCL 4 MG PO TABS
4.0000 mg | ORAL_TABLET | Freq: Four times a day (QID) | ORAL | Status: DC | PRN
  Administered 2015-12-10: 4 mg via ORAL
  Filled 2015-12-08: qty 1

## 2015-12-08 MED ORDER — STERILE WATER FOR IRRIGATION IR SOLN
Status: DC | PRN
  Administered 2015-12-08: 1000 mL

## 2015-12-08 MED ORDER — HYDROCHLOROTHIAZIDE 12.5 MG PO CAPS
12.5000 mg | ORAL_CAPSULE | Freq: Every day | ORAL | Status: DC
  Filled 2015-12-08: qty 1

## 2015-12-08 MED ORDER — HYDROMORPHONE HCL 1 MG/ML IJ SOLN
INTRAMUSCULAR | Status: AC
  Administered 2015-12-08: 0.5 mg via INTRAVENOUS
  Filled 2015-12-08: qty 1

## 2015-12-08 MED ORDER — PROPOFOL 10 MG/ML IV BOLUS
INTRAVENOUS | Status: AC
  Filled 2015-12-08: qty 40

## 2015-12-08 MED ORDER — ATORVASTATIN CALCIUM 20 MG PO TABS
20.0000 mg | ORAL_TABLET | ORAL | Status: DC
  Administered 2015-12-10: 20 mg via ORAL
  Filled 2015-12-08: qty 1

## 2015-12-08 MED ORDER — DEXAMETHASONE SODIUM PHOSPHATE 10 MG/ML IJ SOLN
INTRAMUSCULAR | Status: AC
  Filled 2015-12-08: qty 1

## 2015-12-08 MED ORDER — SELENIUM 50 MCG PO TABS
50.0000 ug | ORAL_TABLET | Freq: Every day | ORAL | Status: DC
  Administered 2015-12-09 – 2015-12-10 (×2): 50 ug via ORAL
  Filled 2015-12-08 (×3): qty 1

## 2015-12-08 MED ORDER — FENTANYL CITRATE (PF) 100 MCG/2ML IJ SOLN
INTRAMUSCULAR | Status: AC
  Administered 2015-12-08: 25 ug via INTRAVENOUS
  Filled 2015-12-08: qty 2

## 2015-12-08 MED ORDER — KETOROLAC TROMETHAMINE 30 MG/ML IJ SOLN
INTRAMUSCULAR | Status: AC
  Filled 2015-12-08: qty 1

## 2015-12-08 MED ORDER — TRANEXAMIC ACID 1000 MG/10ML IV SOLN
1000.0000 mg | INTRAVENOUS | Status: AC
  Administered 2015-12-08: 1000 mg via INTRAVENOUS
  Filled 2015-12-08: qty 10

## 2015-12-08 MED ORDER — PHENOL 1.4 % MT LIQD
1.0000 | OROMUCOSAL | Status: DC | PRN
  Filled 2015-12-08: qty 177

## 2015-12-08 MED ORDER — SUCCINYLCHOLINE CHLORIDE 200 MG/10ML IV SOSY
PREFILLED_SYRINGE | INTRAVENOUS | Status: DC | PRN
  Administered 2015-12-08: 100 mg via INTRAVENOUS

## 2015-12-08 MED ORDER — SODIUM CHLORIDE 0.9 % IV SOLN
INTRAVENOUS | Status: DC
  Administered 2015-12-08: 15:00:00 via INTRAVENOUS

## 2015-12-08 MED ORDER — ANASTROZOLE 1 MG PO TABS
1.0000 mg | ORAL_TABLET | Freq: Every day | ORAL | Status: DC
  Administered 2015-12-08 – 2015-12-10 (×3): 1 mg via ORAL
  Filled 2015-12-08 (×3): qty 1

## 2015-12-08 MED ORDER — ALBUTEROL SULFATE (2.5 MG/3ML) 0.083% IN NEBU: 3.0000 mL | INHALATION_SOLUTION | Freq: Four times a day (QID) | RESPIRATORY_TRACT | Status: DC | PRN

## 2015-12-08 MED ORDER — DEXAMETHASONE SODIUM PHOSPHATE 10 MG/ML IJ SOLN
INTRAMUSCULAR | Status: DC | PRN
  Administered 2015-12-08: 10 mg via INTRAVENOUS

## 2015-12-08 MED ORDER — SODIUM CHLORIDE 0.9 % IJ SOLN
INTRAMUSCULAR | Status: AC
  Filled 2015-12-08: qty 50

## 2015-12-08 MED ORDER — BUPIVACAINE HCL (PF) 0.25 % IJ SOLN
INTRAMUSCULAR | Status: DC | PRN
  Administered 2015-12-08: 30 mL

## 2015-12-08 MED ORDER — FENTANYL CITRATE (PF) 100 MCG/2ML IJ SOLN
INTRAMUSCULAR | Status: DC | PRN
  Administered 2015-12-08 (×2): 50 ug via INTRAVENOUS

## 2015-12-08 MED ORDER — PANTOPRAZOLE SODIUM 40 MG PO TBEC
40.0000 mg | DELAYED_RELEASE_TABLET | Freq: Every day | ORAL | Status: DC
  Administered 2015-12-09 – 2015-12-10 (×2): 40 mg via ORAL
  Filled 2015-12-08 (×2): qty 1

## 2015-12-08 MED ORDER — KETOROLAC TROMETHAMINE 30 MG/ML IJ SOLN
INTRAMUSCULAR | Status: DC | PRN
  Administered 2015-12-08: 30 mg

## 2015-12-08 MED ORDER — BUPIVACAINE-EPINEPHRINE (PF) 0.25% -1:200000 IJ SOLN: INTRAMUSCULAR | Status: DC | PRN

## 2015-12-08 MED ORDER — LACTATED RINGERS IV SOLN
INTRAVENOUS | Status: DC
Start: 2015-12-08 — End: 2015-12-08
  Administered 2015-12-08 (×2): via INTRAVENOUS

## 2015-12-08 MED ORDER — HYDROMORPHONE HCL 1 MG/ML IJ SOLN
0.2500 mg | INTRAMUSCULAR | Status: DC | PRN
  Administered 2015-12-08: 0.5 mg via INTRAVENOUS

## 2015-12-08 MED ORDER — ACETAMINOPHEN 650 MG RE SUPP: 650.0000 mg | Freq: Four times a day (QID) | RECTAL | Status: DC | PRN

## 2015-12-08 MED ORDER — METHOCARBAMOL 500 MG PO TABS
500.0000 mg | ORAL_TABLET | Freq: Four times a day (QID) | ORAL | Status: DC | PRN
  Administered 2015-12-09 – 2015-12-10 (×4): 500 mg via ORAL
  Filled 2015-12-08 (×4): qty 1

## 2015-12-08 MED ORDER — SODIUM CHLORIDE 0.9 % IJ SOLN
INTRAMUSCULAR | Status: DC | PRN
  Administered 2015-12-08: 30 mL

## 2015-12-08 MED ORDER — PROPOFOL 10 MG/ML IV BOLUS
INTRAVENOUS | Status: DC | PRN
  Administered 2015-12-08: 150 mg via INTRAVENOUS

## 2015-12-08 MED ORDER — VANCOMYCIN HCL IN DEXTROSE 1-5 GM/200ML-% IV SOLN
1000.0000 mg | INTRAVENOUS | Status: AC
  Administered 2015-12-08: 1000 mg via INTRAVENOUS
  Filled 2015-12-08: qty 200

## 2015-12-08 MED ORDER — CHLORHEXIDINE GLUCONATE 4 % EX LIQD: 60.0000 mL | Freq: Once | CUTANEOUS | Status: DC

## 2015-12-08 MED ORDER — SUGAMMADEX SODIUM 200 MG/2ML IV SOLN
INTRAVENOUS | Status: AC
  Filled 2015-12-08: qty 2

## 2015-12-08 MED ORDER — B COMPLEX-C PO TABS
1.0000 | ORAL_TABLET | Freq: Every day | ORAL | Status: DC
  Administered 2015-12-09 – 2015-12-10 (×2): 1 via ORAL
  Filled 2015-12-08 (×3): qty 1

## 2015-12-08 MED ORDER — SODIUM CHLORIDE 0.9 % IR SOLN
Status: DC | PRN
  Administered 2015-12-08: 1000 mL

## 2015-12-08 MED ORDER — SUGAMMADEX SODIUM 200 MG/2ML IV SOLN
INTRAVENOUS | Status: DC | PRN
  Administered 2015-12-08: 130 mg via INTRAVENOUS

## 2015-12-08 MED ORDER — ROCURONIUM BROMIDE 10 MG/ML (PF) SYRINGE
PREFILLED_SYRINGE | INTRAVENOUS | Status: DC | PRN
  Administered 2015-12-08: 30 mg via INTRAVENOUS

## 2015-12-08 MED ORDER — ONDANSETRON HCL 4 MG/2ML IJ SOLN
INTRAMUSCULAR | Status: DC | PRN
  Administered 2015-12-08: 4 mg via INTRAVENOUS

## 2015-12-08 MED ORDER — METHOCARBAMOL 500 MG PO TABS: 500.0000 mg | ORAL_TABLET | Freq: Four times a day (QID) | ORAL | 0 refills | Status: DC | PRN

## 2015-12-08 MED ORDER — METOCLOPRAMIDE HCL 5 MG PO TABS: 5.0000 mg | ORAL_TABLET | Freq: Three times a day (TID) | ORAL | Status: DC | PRN

## 2015-12-08 MED ORDER — ONDANSETRON HCL 4 MG/2ML IJ SOLN: 4.0000 mg | Freq: Once | INTRAMUSCULAR | Status: DC | PRN

## 2015-12-08 MED ORDER — ONDANSETRON HCL 4 MG/2ML IJ SOLN
INTRAMUSCULAR | Status: AC
  Filled 2015-12-08: qty 2

## 2015-12-08 MED ORDER — HYDROCODONE-ACETAMINOPHEN 7.5-325 MG PO TABS
1.0000 | ORAL_TABLET | ORAL | Status: DC | PRN
  Administered 2015-12-08 (×2): 1 via ORAL
  Administered 2015-12-08: 2 via ORAL
  Administered 2015-12-09: 1 via ORAL
  Administered 2015-12-09: 2 via ORAL
  Administered 2015-12-09 (×2): 1 via ORAL
  Administered 2015-12-10 (×3): 2 via ORAL
  Filled 2015-12-08: qty 2
  Filled 2015-12-08: qty 1
  Filled 2015-12-08: qty 2
  Filled 2015-12-08 (×3): qty 1
  Filled 2015-12-08: qty 2
  Filled 2015-12-08: qty 1
  Filled 2015-12-08 (×2): qty 2

## 2015-12-08 MED ORDER — ALUM & MAG HYDROXIDE-SIMETH 200-200-20 MG/5ML PO SUSP: 30.0000 mL | ORAL | Status: DC | PRN

## 2015-12-08 MED ORDER — CEFAZOLIN IN D5W 1 GM/50ML IV SOLN
1.0000 g | Freq: Four times a day (QID) | INTRAVENOUS | Status: AC
  Administered 2015-12-08 (×2): 1 g via INTRAVENOUS
  Filled 2015-12-08 (×2): qty 50

## 2015-12-08 MED ORDER — DEXAMETHASONE SODIUM PHOSPHATE 10 MG/ML IJ SOLN
10.0000 mg | Freq: Once | INTRAMUSCULAR | Status: AC
  Administered 2015-12-09: 10 mg via INTRAVENOUS
  Filled 2015-12-08: qty 1

## 2015-12-08 MED ORDER — DOCUSATE SODIUM 100 MG PO CAPS
100.0000 mg | ORAL_CAPSULE | Freq: Two times a day (BID) | ORAL | Status: DC
  Administered 2015-12-08 – 2015-12-10 (×4): 100 mg via ORAL
  Filled 2015-12-08 (×4): qty 1

## 2015-12-08 MED ORDER — HYDROMORPHONE HCL 1 MG/ML IJ SOLN: 0.5000 mg | INTRAMUSCULAR | Status: DC | PRN

## 2015-12-08 MED ORDER — METOCLOPRAMIDE HCL 5 MG/ML IJ SOLN: 5.0000 mg | Freq: Three times a day (TID) | INTRAMUSCULAR | Status: DC | PRN

## 2015-12-08 MED ORDER — CLOPIDOGREL BISULFATE 75 MG PO TABS
75.0000 mg | ORAL_TABLET | Freq: Every day | ORAL | Status: DC
  Administered 2015-12-09 – 2015-12-10 (×2): 75 mg via ORAL
  Filled 2015-12-08 (×2): qty 1

## 2015-12-08 MED ORDER — POTASSIUM CHLORIDE CRYS ER 20 MEQ PO TBCR
40.0000 meq | EXTENDED_RELEASE_TABLET | Freq: Every day | ORAL | Status: DC
  Administered 2015-12-09 – 2015-12-10 (×2): 40 meq via ORAL
  Filled 2015-12-08 (×2): qty 2

## 2015-12-08 MED ORDER — BUPIVACAINE HCL (PF) 0.25 % IJ SOLN
INTRAMUSCULAR | Status: AC
  Filled 2015-12-08: qty 30

## 2015-12-08 MED ORDER — MIDAZOLAM HCL 5 MG/5ML IJ SOLN
INTRAMUSCULAR | Status: DC | PRN
  Administered 2015-12-08: 2 mg via INTRAVENOUS

## 2015-12-08 MED ORDER — BUPIVACAINE HCL (PF) 0.5 % IJ SOLN
INTRAMUSCULAR | Status: DC | PRN
  Administered 2015-12-08: 30 mL via PERINEURAL

## 2015-12-08 MED ORDER — FENTANYL CITRATE (PF) 100 MCG/2ML IJ SOLN
25.0000 ug | INTRAMUSCULAR | Status: DC | PRN
  Administered 2015-12-08 (×2): 25 ug via INTRAVENOUS
  Administered 2015-12-08 (×2): 50 ug via INTRAVENOUS

## 2015-12-08 MED ORDER — B COMPLEX PO TABS: 1.0000 | ORAL_TABLET | Freq: Every day | ORAL | Status: DC

## 2015-12-08 MED ORDER — 0.9 % SODIUM CHLORIDE (POUR BTL) OPTIME
TOPICAL | Status: DC | PRN
  Administered 2015-12-08: 1000 mL

## 2015-12-08 SURGICAL SUPPLY — 42 items
BAG ZIPLOCK 12X15 (MISCELLANEOUS) ×2 IMPLANT
BANDAGE ACE 6X5 VEL STRL LF (GAUZE/BANDAGES/DRESSINGS) ×2 IMPLANT
BLADE SAW SGTL 13.0X1.19X90.0M (BLADE) ×2 IMPLANT
BOWL SMART MIX CTS (DISPOSABLE) ×2 IMPLANT
CAPT KNEE TOTAL 3 ATTUNE ×2 IMPLANT
CEMENT HV SMART SET (Cement) ×4 IMPLANT
CLOTH BEACON ORANGE TIMEOUT ST (SAFETY) ×2 IMPLANT
CUFF TOURN SGL QUICK 34 (TOURNIQUET CUFF) ×1
CUFF TRNQT CYL 34X4X40X1 (TOURNIQUET CUFF) ×1 IMPLANT
DRAPE U-SHAPE 47X51 STRL (DRAPES) ×2 IMPLANT
DRESSING AQUACEL AG SP 3.5X10 (GAUZE/BANDAGES/DRESSINGS) ×1 IMPLANT
DRSG AQUACEL AG SP 3.5X10 (GAUZE/BANDAGES/DRESSINGS) ×2
DURAPREP 26ML APPLICATOR (WOUND CARE) ×4 IMPLANT
ELECT REM PT RETURN 9FT ADLT (ELECTROSURGICAL) ×2
ELECTRODE REM PT RTRN 9FT ADLT (ELECTROSURGICAL) ×1 IMPLANT
GLOVE BIOGEL M STRL SZ7.5 (GLOVE) ×4 IMPLANT
GLOVE BIOGEL PI IND STRL 7.0 (GLOVE) ×1 IMPLANT
GLOVE BIOGEL PI IND STRL 7.5 (GLOVE) ×5 IMPLANT
GLOVE BIOGEL PI INDICATOR 7.0 (GLOVE) ×1
GLOVE BIOGEL PI INDICATOR 7.5 (GLOVE) ×5
GLOVE ORTHO TXT STRL SZ7.5 (GLOVE) ×4 IMPLANT
GLOVE SURG SS PI 7.5 STRL IVOR (GLOVE) ×4 IMPLANT
GOWN SPEC L3 XXLG W/TWL (GOWN DISPOSABLE) ×2 IMPLANT
GOWN STRL REUS W/ TWL XL LVL3 (GOWN DISPOSABLE) ×1 IMPLANT
GOWN STRL REUS W/TWL LRG LVL3 (GOWN DISPOSABLE) ×2 IMPLANT
GOWN STRL REUS W/TWL XL LVL3 (GOWN DISPOSABLE) ×3 IMPLANT
HANDPIECE INTERPULSE COAX TIP (DISPOSABLE) ×1
LIQUID BAND (GAUZE/BANDAGES/DRESSINGS) ×2 IMPLANT
MANIFOLD NEPTUNE II (INSTRUMENTS) ×2 IMPLANT
PACK TOTAL KNEE CUSTOM (KITS) ×2 IMPLANT
POSITIONER SURGICAL ARM (MISCELLANEOUS) ×2 IMPLANT
SET HNDPC FAN SPRY TIP SCT (DISPOSABLE) ×1 IMPLANT
SET PAD KNEE POSITIONER (MISCELLANEOUS) ×2 IMPLANT
SUT MNCRL AB 4-0 PS2 18 (SUTURE) ×2 IMPLANT
SUT VIC AB 1 CT1 36 (SUTURE) ×2 IMPLANT
SUT VIC AB 2-0 CT1 27 (SUTURE) ×3
SUT VIC AB 2-0 CT1 TAPERPNT 27 (SUTURE) ×3 IMPLANT
SUT VLOC 180 0 24IN GS25 (SUTURE) ×2 IMPLANT
SYR 50ML LL SCALE MARK (SYRINGE) ×2 IMPLANT
TRAY FOLEY CATH SILVER 14FR (SET/KITS/TRAYS/PACK) ×2 IMPLANT
WRAP KNEE MAXI GEL POST OP (GAUZE/BANDAGES/DRESSINGS) ×2 IMPLANT
YANKAUER SUCT BULB TIP 10FT TU (MISCELLANEOUS) ×2 IMPLANT

## 2015-12-08 NOTE — Transfer of Care (Signed)
Immediate Anesthesia Transfer of Care Note  Patient: Alexandria [name]  Procedure(s) Performed: Procedure(s): LEFT TOTAL KNEE ARTHROPLASTY (Left)  Patient Location: PACU  Anesthesia Type:General  Level of Consciousness: sedated  Airway & Oxygen Therapy: Patient Spontanous Breathing and Patient connected to face mask oxygen  Post-op Assessment: Report given to RN and Post -op Vital signs reviewed and stable  Post vital signs: Reviewed and stable  Last Vitals:  Vitals:   12/08/15 0941 12/08/15 0942  BP:    Pulse: 73 72  Resp: 11 13  Temp:      Last Pain:  Vitals:   12/08/15 0836  TempSrc:   PainSc: 4          Complications: No apparent anesthesia complications

## 2015-12-08 NOTE — Progress Notes (Signed)
AssistedDr. Cheral Bay with left, ultrasound guided, adductor canal block. Side rails up, monitors on throughout procedure. See vital signs in flow sheet. Tolerated Procedure well.

## 2015-12-08 NOTE — Anesthesia Procedure Notes (Signed)
Procedure Name: Intubation Date/Time: 12/08/2015 9:52 AM Performed by: Lind Covert Pre-anesthesia Checklist: Patient identified, Timeout performed, Emergency Drugs available, Suction available and Patient being monitored Patient Re-evaluated:Patient Re-evaluated prior to inductionOxygen Delivery Method: Circle system utilized Preoxygenation: Pre-oxygenation with 100% oxygen Laryngoscope Size: Mac and 3 Grade View: Grade I Tube type: Oral Tube size: 7.0 mm Number of attempts: 1 Airway Equipment and Method: Stylet Placement Confirmation: ETT inserted through vocal cords under direct vision,  positive ETCO2 and breath sounds checked- equal and bilateral Secured at: 22 cm Tube secured with: Tape Dental Injury: Teeth and Oropharynx as per pre-operative assessment

## 2015-12-08 NOTE — Evaluation (Signed)
Physical Therapy Evaluation Patient Details Name: Alexandria [name] MRN: [id number] DOB: [birth year] Today's Date: 12/08/2015   History of Present Illness  s/p L TKA; PMHx: brainstem infarct with balance issues  Clinical Impression  Pt is s/p TKA resulting in the deficits listed below (see PT Problem List).  Pt will benefit from skilled PT to increase their independence and safety with mobility to allow discharge to the venue listed below.      Follow Up Recommendations Home health PT;Outpatient PT (vs)    Equipment Recommendations  None recommended by PT    Recommendations for Other Services       Precautions / Restrictions Precautions Precautions: Fall;Knee Restrictions Weight Bearing Restrictions: No Other Position/Activity Restrictions: WBAT      Mobility  Bed Mobility Overal bed mobility: Needs Assistance Bed Mobility: Supine to Sit     Supine to sit: Min assist     General bed mobility comments: assist with LLE, cues to self assist with UEs  Transfers Overall transfer level: Needs assistance Equipment used: Rolling walker (2 wheeled) Transfers: Sit to/from Stand Sit to Stand: Min assist         General transfer comment: assist to rise and steady self in standing  Ambulation/Gait Ambulation/Gait assistance: Min assist Ambulation Distance (Feet): 38 Feet Assistive device: Rolling walker (2 wheeled) Gait Pattern/deviations: Step-to pattern;Antalgic     General Gait Details: cues for sequence and RW distance from self  Stairs            Wheelchair Mobility    Modified Rankin (Stroke Patients Only)       Balance                                             Pertinent Vitals/Pain Pain Score: 5  Pain Location: L knee Pain Descriptors / Indicators: Aching;Sore Pain Intervention(s): Limited activity within patient's tolerance;Monitored during session;Premedicated before session;Repositioned;Ice applied    Home Living  Family/patient expects to be discharged to:: Private residence Living Arrangements: Alone Available Help at Discharge: Family;Available 24 hours/day (children for ~1wk) Type of Home: House Home Access: Stairs to enter   [employer] of Steps: 1 "grab bar" Home Layout: One level Home Equipment: Grab bars - toilet;Walker - 2 wheels      Prior Function Level of Independence: Independent               Hand Dominance        Extremity/Trunk Assessment   Upper Extremity Assessment: Defer to OT evaluation           Lower Extremity Assessment: LLE deficits/detail   LLE Deficits / Details: ankle WFL; knee and hip 2+ to 3/5, limited by post op pain as expected; knee flexion to 65* in sitting     Communication   Communication: No difficulties  Cognition Arousal/Alertness: Awake/alert Behavior During Therapy: WFL for tasks assessed/performed Overall Cognitive Status: Within Functional Limits for tasks assessed                      General Comments      Exercises     Assessment/Plan    PT Assessment Patient needs continued PT services  PT Problem List Decreased strength;Decreased range of motion;Decreased mobility;Decreased knowledge of use of DME;Pain          PT Treatment Interventions DME instruction;Gait training;Functional mobility training;Stair training;Therapeutic activities;Therapeutic  exercise;Patient/family education    PT Goals (Current goals can be found in the Care Plan section)  Acute Rehab PT Goals Patient Stated Goal: move with less pain, get back to normal  in 3 to 50mos PT Goal Formulation: With patient Time For Goal Achievement: 12/15/15    Frequency 7X/week   Barriers to discharge        Co-evaluation               End of Session Equipment Utilized During Treatment: Gait belt Activity Tolerance: Patient tolerated treatment well Patient left: in chair;with call bell/phone within reach;with chair alarm set;with  family/visitor present           Time: [id number] PT Time Calculation (min) (ACUTE ONLY): 23 min   Charges:   PT Evaluation $PT Eval Low Complexity: 1 Procedure PT Treatments $Gait Training: 8-22 mins   PT G Codes:        Cinnamon Morency 12-18-2015, 5:30 PM

## 2015-12-08 NOTE — Progress Notes (Signed)
Called for new order per pt request. Call back from [facility] with Dr. Lyla Glassing in [place]. Okay for pt to have Zantac.   Per Pharmacy, Zantac not formulary. Pepcid used instead.   Pt states she is allergic to Pepcid, and will take a ginger ale instead.

## 2015-12-08 NOTE — Interval H&P Note (Signed)
History and Physical Interval Note:  12/08/2015 8:49 AM  Alexandria [name]  has presented today for surgery, with the diagnosis of left knee oa  The various methods of treatment have been discussed with the patient and family. After consideration of risks, benefits and other options for treatment, the patient has consented to  Procedure(s): LEFT TOTAL KNEE ARTHROPLASTY (Left) as a surgical intervention .  The patient's history has been reviewed, patient examined, no change in status, stable for surgery.  I have reviewed the patient's chart and labs.  Questions were answered to the patient's satisfaction.     Mauri Pole

## 2015-12-08 NOTE — Anesthesia Preprocedure Evaluation (Addendum)
Anesthesia Evaluation  Patient identified by MRN, date of birth, ID band Patient awake    Reviewed: Allergy & Precautions, NPO status , Patient's Chart, lab work & pertinent test results  History of Anesthesia Complications Negative for: history of anesthetic complications  Airway Mallampati: II  TM Distance: >3 FB Neck ROM: Full    Dental  (+) Teeth Intact, Dental Advisory Given   Pulmonary neg shortness of breath, asthma , neg sleep apnea, COPD,  COPD inhaler, neg recent URI, former smoker,    breath sounds clear to auscultation       Cardiovascular hypertension, Pt. on medications (-) angina+ Peripheral Vascular Disease  (-) Past MI, (-) Cardiac Stents, (-) CABG and (-) Orthopnea + dysrhythmias (sinus bradycardia, palpitations; h/o afib)  Rhythm:Regular Rate:Normal  HLD  EKG 11/26/2015: Sinus bradycardia  Myocardial Perfusion Imaging 06/13/2012: Negative for ischemia  TTE 06/06/2012: Study Conclusions  - Left ventricle: The cavity size was normal. Wall thickness was normal. The estimated ejection fraction was 60%. Wall motion was normal; there were no regional wall motion abnormalities. - Impressions: No cardiac source of embolism was identified, but cannot be ruled out on the basis of this examination. 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  Female GU complaint     Musculoskeletal  (+) Arthritis , Osteoarthritis,    Abdominal   Peds  Hematology  (+) Blood dyscrasia, anemia ,   Anesthesia Other Findings H/o radiation for breast cancer 2013/2014, hearing loss  Reproductive/Obstetrics                            Anesthesia Physical Anesthesia  Plan  ASA: III  Anesthesia Plan: General and Regional   Post-op Pain Management: GA combined w/ Regional for post-op pain   Induction: Intravenous  Airway Management Planned: Oral ETT  Additional Equipment:   Intra-op Plan:   Post-operative Plan: Extubation in OR  Informed Consent: I have reviewed the patients History and Physical, chart, labs and discussed the procedure including the risks, benefits and alternatives for the proposed anesthesia with the patient or authorized representative who has indicated his/her understanding and acceptance.   Dental advisory given  Plan Discussed with: CRNA  Anesthesia Plan Comments: (Risks of general anesthesia discussed including, but not limited to, sore throat, hoarse voice, chipped/damaged teeth, injury to vocal cords, nausea and vomiting, allergic reactions, lung infection, heart attack, stroke, and death. All questions answered.  Last dose of Plavix 9/20  Platelets 221)       Anesthesia Quick Evaluation

## 2015-12-08 NOTE — Anesthesia Procedure Notes (Signed)
Anesthesia Regional Block:  Adductor canal block  Pre-Anesthetic Checklist: ,, timeout performed, Correct Patient, Correct Site, Correct Laterality, Correct Procedure, Correct Position, site marked, Risks and benefits discussed,  Surgical consent,  Pre-op evaluation,  At surgeon's request and post-op pain management  Laterality: Left  Prep: chloraprep       Needles:   Needle Type: Echogenic Stimulator Needle     Needle Length: 9cm 9 cm Needle Gauge: 21 and 21 G    Additional Needles:  Procedures: ultrasound guided (picture in chart) Adductor canal block Narrative:  Start time: 12/08/2015 9:43 AM End time: 12/08/2015 9:45 AM Injection made incrementally with aspirations every 5 mL.  Performed by: Personally  Anesthesiologist: [name]

## 2015-12-08 NOTE — Anesthesia Postprocedure Evaluation (Signed)
Anesthesia Post Note  Patient: Alexandria [name]  Procedure(s) Performed: Procedure(s) (LRB): LEFT TOTAL KNEE ARTHROPLASTY (Left)  Patient location during evaluation: PACU Anesthesia Type: General and Regional Level of consciousness: awake and alert Pain management: pain level controlled Vital Signs Assessment: post-procedure vital signs reviewed and stable Respiratory status: spontaneous breathing, nonlabored ventilation and respiratory function stable Cardiovascular status: blood pressure returned to baseline and stable Postop Assessment: no signs of nausea or vomiting Anesthetic complications: no    Last Vitals:  Vitals:   12/08/15 1215 12/08/15 1230  BP: 136/66 124/70  Pulse: 73 73  Resp: 14 15  Temp:      Last Pain:  Vitals:   12/08/15 1230  TempSrc:   PainSc: 7     LLE Motor Response: Purposeful movement (12/08/15 1230) LLE Sensation: Full sensation (12/08/15 1230) RLE Motor Response: Purposeful movement (12/08/15 1230) RLE Sensation: Full sensation (12/08/15 1230)      [name]

## 2015-12-08 NOTE — Discharge Instructions (Signed)

## 2015-12-08 NOTE — Op Note (Signed)
NAME:  Alexandria [name]                      MEDICAL RECORD NO.:  [id number]                             FACILITY:  [facility]      PHYSICIAN:  Pietro Cassis. Alvan Dame, M.D.  DATE OF BIRTH:  Apr 20, 1940      DATE OF PROCEDURE:  12/08/2015                                     OPERATIVE REPORT         PREOPERATIVE DIAGNOSIS:  Left knee osteoarthritis.      POSTOPERATIVE DIAGNOSIS:  Left knee osteoarthritis.      FINDINGS:  The patient was noted to have complete loss of cartilage and   bone-on-bone arthritis with associated osteophytes in all three compartments of   the knee with a significant synovitis and associated effusion.      PROCEDURE:  Left total knee replacement.      COMPONENTS USED:  DePuy Attune rotating platform posterior stabilized knee   system, a size 4N femur, size 2 tibia, size 6 PS AOX insert, and 32 anatomic patellar   button.      SURGEON:  Pietro Cassis. Alvan Dame, M.D.      ASSISTANT:  Nehemiah Massed, PA-C.      ANESTHESIA:  General and Regional.      SPECIMENS:  None.      COMPLICATION:  None.      DRAINS:  None.  EBL: <100cc      TOURNIQUET TIME:   Total Tourniquet Time Documented: Thigh (Left) - 28 minutes Total: Thigh (Left) - 28 minutes  .      The patient was stable to the recovery room.      INDICATION FOR PROCEDURE:  Alexandria [name] is a 75 y.o. female patient of   mine.  The patient had been seen, evaluated, and treated conservatively in the   office with medication, activity modification, and injections.  The patient had   radiographic changes of bone-on-bone arthritis with endplate sclerosis and osteophytes noted.      The patient failed conservative measures including medication, injections, and activity modification, and at this point was ready for more definitive measures.   Based on the radiographic changes and failed conservative measures, the patient   decided to proceed with total knee replacement.  Risks of infection,   DVT, component failure, need  for revision surgery, postop course, and   expectations were all   discussed and reviewed.  Consent was obtained for benefit of pain   relief.      PROCEDURE IN DETAIL:  The patient was brought to the operative theater.   Once adequate anesthesia, preoperative antibiotics, 2 gm of Ancef, 1 gm of Tranexamic Acid, and 10 mg of Decadron administered, the patient was positioned supine with the left thigh tourniquet placed.  The  left lower extremity was prepped and draped in sterile fashion.  A time-   out was performed identifying the patient, planned procedure, and   extremity.      The left lower extremity was placed in the [facility] leg holder.  The leg was   exsanguinated, tourniquet elevated to 250 mmHg.  A midline incision was   made  followed by median parapatellar arthrotomy.  Following initial   exposure, attention was first directed to the patella.  Precut   measurement was noted to be 21 mm.  I resected down to 13-14 mm and used a   32 anatomic patellar button to restore patellar height as well as cover the cut   surface.      The lug holes were drilled and a metal shim was placed to protect the   patella from retractors and saw blades.      At this point, attention was now directed to the femur.  The femoral   canal was opened with a drill, irrigated to try to prevent fat emboli.  An   intramedullary rod was passed at 3 degrees valgus, 9 mm of bone was   resected off the distal femur.  Following this resection, the tibia was   subluxated anteriorly.  Using the extramedullary guide, 2 mm of bone was resected off   the proximal medial tibia.  We confirmed the gap would be   stable medially and laterally with a size 5 spacer gap as well as confirmed   the cut was perpendicular in the coronal plane, checking with an alignment rod.      Once this was done, I sized the femur to be a size 4 in the anterior-   posterior dimension, chose a narrow component based on medial and   lateral  dimension.  The size 4 rotation block was then pinned in   position anterior referenced using the C-clamp to set rotation.  The   anterior, posterior, and  chamfer cuts were made without difficulty nor   notching making certain that I was along the anterior cortex to help   with flexion gap stability.      The final box cut was made off the lateral aspect of distal femur.      At this point, the tibia was sized to be a size 2, the size 2 tray was   then pinned in position through the medial third of the tubercle,   drilled, and keel punched.  Trial reduction was now carried with a 4 femur,  2 tibia, a size 5 then 6 mm insert, and the 32 anatomic patella botton.  The knee was brought to   extension, full extension with good flexion stability with the patella   tracking through the trochlea without application of pressure.  Given   all these findings the femoral lugs were drilled and then the trial components removed.  Final components were   opened and cement was mixed.  The knee was irrigated with normal saline   solution and pulse lavage.  The synovial lining was   then injected with 30 cc of 0.25% Marcaine with epinephrine and 1 cc of Toradol plus 30 cc of NS for a  total of 61 cc.      The knee was irrigated.  Final implants were then cemented onto clean and   dried cut surfaces of bone with the knee brought to extension with a size 6 PS trial insert.      Once the cement had fully cured, the excess cement was removed   throughout the knee.  I confirmed I was satisfied with the range of   motion and stability, and the final size 6 PS AOX insert was chosen.  It was   placed into the knee.      The tourniquet had been let down at 27 minutes.  No significant   hemostasis required.  The   extensor mechanism was then reapproximated using #1 Vicryl and #0 V-lock sutureswith the knee   in flexion.  The   remaining wound was closed with 2-0 Vicryl and running 4-0 Monocryl.   The knee was  cleaned, dried, dressed sterilely using Dermabond and   Aquacel dressing.  The patient was then   brought to recovery room in stable condition, tolerating the procedure   well.   Please note that Physician Assistant, Nehemiah Massed, PA-C, was present for the entirety of the case, and was utilized for pre-operative positioning, peri-operative retractor management, general facilitation of the procedure.  He was also utilized for primary wound closure at the end of the case.              Pietro Cassis Alvan Dame, M.D.    12/08/2015 2:01 PM

## 2015-12-09 LAB — BASIC METABOLIC PANEL
Anion gap: 8 (ref 5–15)
BUN: 16 mg/dL (ref 6–20)
CO2: 26 mmol/L (ref 22–32)
Calcium: 8.9 mg/dL (ref 8.9–10.3)
Chloride: 107 mmol/L (ref 101–111)
Creatinine, Ser: 0.83 mg/dL (ref 0.44–1.00)
Glucose, Bld: 137 mg/dL — ABNORMAL HIGH (ref 65–99)
POTASSIUM: 3.5 mmol/L (ref 3.5–5.1)
SODIUM: 141 mmol/L (ref 135–145)

## 2015-12-09 LAB — CBC
HCT: 32 % — ABNORMAL LOW (ref 36.0–46.0)
Hemoglobin: 10.8 g/dL — ABNORMAL LOW (ref 12.0–15.0)
MCH: 28.6 pg (ref 26.0–34.0)
MCHC: 33.8 g/dL (ref 30.0–36.0)
MCV: 84.7 fL (ref 78.0–100.0)
PLATELETS: 193 10*3/uL (ref 150–400)
RBC: 3.78 MIL/uL — AB (ref 3.87–5.11)
RDW: 13 % (ref 11.5–15.5)
WBC: 11.9 10*3/uL — AB (ref 4.0–10.5)

## 2015-12-09 NOTE — Clinical Social Work Note (Signed)
CSW was consulted for SNF.  Per case manager and MD patient is discharging home with home health.  No CSW needs at this time.  CSW signing off.  Dede Query, LCSW [place] Worker - Weekend Coverage cell #: [phone]

## 2015-12-09 NOTE — Progress Notes (Signed)
Physical Therapy Treatment Patient Details Name: Alexandria [name] MRN: [id number] DOB: 1940/08/03 Today's Date: 12/09/2015    History of Present Illness s/p L TKA; PMHx: brainstem infarct with balance issues    PT Comments    Initially unsteady during ambulation, improved with distance. Plans DC tomorrow.  Follow Up Recommendations  Home health PT;Outpatient PT     Equipment Recommendations  None recommended by PT    Recommendations for Other Services       Precautions / Restrictions Precautions Precautions: Fall;Knee    Mobility  Bed Mobility Overal bed mobility: Needs Assistance Bed Mobility: Supine to Sit;Sit to Supine     Supine to sit: Supervision;HOB elevated Sit to supine: Min guard   General bed mobility comments: cues for L leg psiition  Transfers Overall transfer level: Needs assistance Equipment used: Rolling walker (2 wheeled) Transfers: Sit to/from Stand Sit to Stand: Min assist         General transfer comment: steady assist upon standing up  Ambulation/Gait Ambulation/Gait assistance: Min assist Ambulation Distance (Feet): 100 Feet Assistive device: Rolling walker (2 wheeled) Gait Pattern/deviations: Step-to pattern;Step-through pattern;Antalgic     General Gait Details: cues for sequence and RW distance from self, cues to place  left foot flatter   Stairs            Wheelchair Mobility    Modified Rankin (Stroke Patients Only)       Balance                                    Cognition Arousal/Alertness: Awake/alert                          Exercises Total Joint Exercises Ankle Circles/Pumps: AROM;Both;10 reps Quad Sets: AROM;Both;10 reps Heel Slides: AAROM;Left;10 reps Straight Leg Raises: AAROM;Left;10 reps    General Comments        Pertinent Vitals/Pain Pain Score: 2  Pain Location: L knee Pain Descriptors / Indicators: Discomfort;Grimacing Pain Intervention(s): Limited activity  within patient's tolerance;Monitored during session;Premedicated before session;Ice applied    Home Living                      Prior Function            PT Goals (current goals can now be found in the care plan section) Progress towards PT goals: Progressing toward goals    Frequency    7X/week      PT Plan Current plan remains appropriate    Co-evaluation             End of Session   Activity Tolerance: Patient tolerated treatment well Patient left: in bed;with call bell/phone within reach     Time: 0815-0852 PT Time Calculation (min) (ACUTE ONLY): 37 min  Charges:  $Gait Training: 8-22 mins $Therapeutic Exercise: 8-22 mins                    G Codes:      Claretha Cooper 12/09/2015, 3:03 PM Tresa Endo PT [phone]

## 2015-12-09 NOTE — Care Management Note (Signed)
Case Management Note  Patient Details  Name: Alexandria [name] MRN: [id number] Date of Birth: 01-08-1941  Subjective/Objective:                  LEFT TOTAL KNEE ARTHROPLASTY (Left) Action/Plan: Discharge planning Expected Discharge Date:  12/09/15              Expected Discharge Plan:  [place]  In-House Referral:     Discharge planning Services  CM Consult  Post Acute Care Choice:  Home Health Choice offered to:  Patient  DME Arranged:  N/A DME Agency:  NA  HH Arranged:  [place] Agency:  [facility] ([facility])  Status of Service:  Completed, signed off  If discussed at [employer], dates discussed:    Additional Comments: CM met with pt in room to offer choice of home health agency.  Pt chooses [facility] to render HHPT.  Referral given to [facility].  Pt states she has both a rolling walker and a 3n1 at home.  No other CM needs were communicated. Dellie Catholic, RN 12/09/2015, 9:50 AM

## 2015-12-09 NOTE — Progress Notes (Signed)
Occupational Therapy Evaluation Patient Details Name: Alexandria [name] MRN: [id number] DOB: January 11, 1941 Today's Date: 12/09/2015    History of Present Illness s/p L TKA; PMHx: brainstem infarct with balance issues   Clinical Impression   Patient presents to OT with decreased ADL independence and safety due to the deficits listed below. She will benefit from skilled OT to maximize function and to facilitate a safe discharge. OT will follow.    Follow Up Recommendations  No OT follow up    Equipment Recommendations  None recommended by OT    Recommendations for Other Services       Precautions / Restrictions Precautions Precautions: Fall;Knee Restrictions Weight Bearing Restrictions: No Other Position/Activity Restrictions: WBAT      Mobility Bed Mobility Overal bed mobility: Needs Assistance Bed Mobility: Supine to Sit;Sit to Supine     Supine to sit: Supervision;HOB elevated Sit to supine: Min assist;HOB elevated   General bed mobility comments: assist with LLE, cues to self assist with UEs  Transfers Overall transfer level: Needs assistance Equipment used: Rolling walker (2 wheeled) Transfers: Sit to/from Stand Sit to Stand: Min guard         General transfer comment: cues for hand placement    Balance                                            ADL Overall ADL's : Needs assistance/impaired     Grooming: Wash/dry hands;Min guard;Standing           Upper Body Dressing : Set up;Sitting   Lower Body Dressing: Minimal assistance;Sit to/from stand   Toilet Transfer: Min guard;Ambulation;Comfort height toilet;Grab bars;RW Armed forces technical officer Details (indicate cue type and reason): pt did not want to use 3 in 1 over toilet Toileting- Clothing Manipulation and Hygiene: Min guard;Sit to/from stand   Tub/ Shower Transfer: Walk-in shower;Min guard;Ambulation;Grab bars;Rolling walker   Functional mobility during ADLs: Min guard;Rolling  walker General ADL Comments: Patient practiced ambulation to bathroom with RW, toilet transfer/toileting, walk-in shower transfer, grooming at sink, then ambulation back to bed with RW. Tolerated well. Prefers to go home tomorrow. Will address LB self-care next visit.     Vision     Perception     Praxis      Pertinent Vitals/Pain Pain Assessment: 0-10 Pain Score: 5  Pain Location: L knee Pain Descriptors / Indicators: Aching;Sore Pain Intervention(s): Monitored during session;Repositioned;Ice applied     Hand Dominance     Extremity/Trunk Assessment Upper Extremity Assessment Upper Extremity Assessment: Overall WFL for tasks assessed   Lower Extremity Assessment Lower Extremity Assessment: Defer to PT evaluation       Communication Communication Communication: No difficulties   Cognition Arousal/Alertness: Awake/alert Behavior During Therapy: WFL for tasks assessed/performed Overall Cognitive Status: Within Functional Limits for tasks assessed                     General Comments       Exercises       Shoulder Instructions      Home Living Family/patient expects to be discharged to:: Private residence Living Arrangements: Alone Available Help at Discharge: Family;Available 24 hours/day Type of Home: House Home Access: Stairs to enter [employer] of Steps: 1 "grab bar"   Home Layout: One level     Bathroom Shower/Tub: Occupational psychologist: Standard Bathroom Accessibility:  Yes How Accessible: Accessible via walker Home Equipment: Grab bars - toilet;Grab bars - tub/shower;Walker - 2 wheels          Prior Functioning/Environment Level of Independence: Independent        Comments: retired Marine scientist        OT Problem List: Decreased strength;Decreased range of motion;Decreased knowledge of use of DME or AE;Pain   OT Treatment/Interventions: Self-care/ADL training;DME and/or AE instruction;Therapeutic  activities;Patient/family education    OT Goals(Current goals can be found in the care plan section) Acute Rehab OT Goals Patient Stated Goal: home tomorrow OT Goal Formulation: With patient Time For Goal Achievement: 12/23/15 Potential to Achieve Goals: Good ADL Goals Pt Will Perform Lower Body Bathing: with supervision;sit to/from stand Pt Will Perform Lower Body Dressing: with supervision;sit to/from stand  OT Frequency: Min 2X/week   Barriers to D/C:            Co-evaluation              End of Session Equipment Utilized During Treatment: Rolling walker Nurse Communication: Mobility status  Activity Tolerance: Patient tolerated treatment well Patient left: in bed;with call bell/phone within reach;with bed alarm set   Time: 0902-0926 OT Time Calculation (min): 24 min Charges:  OT General Charges $OT Visit: 1 Procedure OT Evaluation $OT Eval Low Complexity: 1 Procedure OT Treatments $Self Care/Home Management : 8-22 mins G-Codes:    Ariannie Penaloza A 12-24-2015, 11:51 AM

## 2015-12-09 NOTE — Progress Notes (Signed)
Subjective: 1 Day Post-Op Procedure(s) (LRB): LEFT TOTAL KNEE ARTHROPLASTY (Left)    Patient ID: Alexandria [name], female   DOB: [birth year], 75 y.o.   MRN: [id number]   Patient reports pain as mild.  Pain well controlled at this time.  Ready to do well with her therapy  Objective:   VITALS:   Vitals:   12/09/15 0208 12/09/15 0523  BP: 108/60 (!) 111/51  Pulse: 78 72  Resp: 16 16  Temp: 98.6 F (37 C) 99 F (37.2 C)    Neurovascular intact Incision: dressing C/D/I  LABS  Recent Labs  12/09/15 0439  HGB 10.8*  HCT 32.0*  WBC 11.9*  PLT 193     Recent Labs  12/09/15 0439  NA 141  K 3.5  BUN 16  CREATININE 0.83  GLUCOSE 137*    No results for input(s): LABPT, INR in the last 72 hours.   Assessment/Plan: 1 Day Post-Op Procedure(s) (LRB): LEFT TOTAL KNEE ARTHROPLASTY (Left)   Advance diet Up with therapy Discharge home with home health  Reviewed goals RTC in 2 weeks

## 2015-12-09 NOTE — Progress Notes (Signed)
Physical Therapy Treatment Patient Details Name: LANDRIE BOTTONE MRN: [id number] DOB: 06-Apr-1940 Today's Date: 12/09/2015    History of Present Illness s/p L TKA; PMHx: brainstem infarct with balance issues    PT Comments    The patient is improving in mobility and   Minimal pain with pain meds.  Follow Up Recommendations  Home health PT;Outpatient PT     Equipment Recommendations  None recommended by PT    Recommendations for Other Services       Precautions / Restrictions Precautions Precautions: Fall;Knee    Mobility  Bed Mobility Overal bed mobility: Needs Assistance Bed Mobility: Supine to Sit;Sit to Supine     Supine to sit: Supervision Sit to supine: Supervision   General bed mobility comments: cues for L leg psiition  Transfers Overall transfer level: Needs assistance Equipment used: Rolling walker (2 wheeled) Transfers: Sit to/from Stand Sit to Stand: Min assist         General transfer comment: steady assist upon standing up  Ambulation/Gait Ambulation/Gait assistance: Min guard Ambulation Distance (Feet): 200 Feet Assistive device: Rolling walker (2 wheeled) Gait Pattern/deviations: Step-to pattern;Step-through pattern;Antalgic     General Gait Details: cues for sequence and RW distance from self, cues to place  left foot flatter   Stairs            Wheelchair Mobility    Modified Rankin (Stroke Patients Only)       Balance                                    Cognition Arousal/Alertness: Awake/alert                          Exercises Total Joint Exercises Ankle Circles/Pumps: AROM;Both;10 reps Quad Sets: AROM;Both;10 reps Heel Slides: AAROM;Left;10 reps Hip ABduction/ADduction: AROM;Left;10 reps Straight Leg Raises: AAROM;Left;10 reps Goniometric ROM: 10-60 left knee    General Comments        Pertinent Vitals/Pain Pain Score: 5  Pain Location: left knee Pain Descriptors / Indicators:  Discomfort Pain Intervention(s): Monitored during session;Limited activity within patient's tolerance;Premedicated before session;Ice applied    Home Living                      Prior Function            PT Goals (current goals can now be found in the care plan section) Progress towards PT goals: Progressing toward goals    Frequency    7X/week      PT Plan Current plan remains appropriate    Co-evaluation             End of Session   Activity Tolerance: Patient tolerated treatment well Patient left: in bed;with call bell/phone within reach;with bed alarm set;with family/visitor present     Time: [id number] PT Time Calculation (min) (ACUTE ONLY): 27 min  Charges:  $Gait Training: 8-22 mins $Therapeutic Exercise: 8-22 mins                    G Codes:      Claretha Cooper 12/09/2015, 3:07 PM

## 2015-12-10 MED ORDER — FERROUS SULFATE 325 (65 FE) MG PO TABS: 325.0000 mg | ORAL_TABLET | Freq: Two times a day (BID) | ORAL | 0 refills | Status: DC

## 2015-12-10 NOTE — Progress Notes (Signed)
Subjective: 2 Days Post-Op Procedure(s) (LRB): LEFT TOTAL KNEE ARTHROPLASTY (Left) Patient reports pain as mild.   Patient seen in rounds for Dr. Alvan Dame. Patient is well, and has had no acute complaints or problems. She is feeling more confident with ambulation. Voiding well. Positive flatus. No issues overnight.    Objective: Vital signs in last 24 hours: Temp:  [98.4 F (36.9 C)-98.8 F (37.1 C)] 98.5 F (36.9 C) (09/28 0757) Pulse Rate:  [56-78] 65 (09/28 0757) Resp:  [15-18] 18 (09/28 0757) BP: (109-141)/(42-55) 125/51 (09/28 0757) SpO2:  [93 %-99 %] 98 % (09/28 0757)  Intake/Output from previous day:  Intake/Output Summary (Last 24 hours) at 12/10/15 0804 Last data filed at 12/10/15 0500  Gross per 24 hour  Intake              240 ml  Output             1600 ml  Net            -1360 ml     Labs:  Recent Labs  12/09/15 0439  HGB 10.8*    Recent Labs  12/09/15 0439  WBC 11.9*  RBC 3.78*  HCT 32.0*  PLT 193    Recent Labs  12/09/15 0439  NA 141  K 3.5  CL 107  CO2 26  BUN 16  CREATININE 0.83  GLUCOSE 137*  CALCIUM 8.9   EXAM General - Patient is Alert and Oriented Extremity - Neurologically intact Intact pulses distally Dorsiflexion/Plantar flexion intact No cellulitis present Compartment soft Dressing/Incision - clean, dry, no drainage Motor Function - intact, moving foot and toes well on exam.  Past Medical History:  Diagnosis Date  . Allergy   . Anemia    hx of   . Anxiety   . Arthritis    left knee, hands, back  . Asthma    daily and prn inhalers  . 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 problems with balance  . Breast cancer ([place]) 12/2011   right, ER+, PR -, Her 2 -  . COPD (chronic obstructive pulmonary disease) ([place])   . Degenerative joint disease of low back   . Depression   . Dysrhythmia    atrial fib   . GERD (gastroesophageal reflux disease)   . Hearing loss    bilateral hearing  aids  . 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 History of kidney stones   . Hx of radiation therapy 03/12/12 -04/13/12   right breast  . Hyperlipidemia   . Hypertension    under control, has been on med. since age 39  . IBS (irritable bowel syndrome)   . Long term current use of aromatase inhibitor 05/08/2012  . Overactive bladder   . Palpitations   . Pneumonia    hx of x 2   . PVD (peripheral vascular disease) ([facility])    varicose veins - left worse than right  . Spinal stenosis   . Stress incontinence   . Stroke [facility])    problem with expressive communicaiton  . Traumatic hemopneumothorax 1982   left    Assessment/Plan: 2 Days Post-Op Procedure(s) (LRB): LEFT TOTAL KNEE ARTHROPLASTY (Left) Active Problems:   S/P total knee replacement using cement  Estimated body mass index is 23.86 kg/m as calculated from the following:   Height as of this encounter: 5\' 4"  (1.626 m).   Weight as of this encounter: 13  kg (139 lb). Advance diet Up with therapy Discharge home  DVT Prophylaxis - Aspirin Weight-Bearing as tolerated   Will continue therapy this morning with plan for discharge home later. Discharge instructions given.   Ardeen Jourdain, PA-C Orthopaedic Surgery 12/10/2015, 8:04 AM

## 2015-12-10 NOTE — Progress Notes (Signed)
Pt was given discharge instructions. Pt understands how to bathe. Pt verbalize understanding of discharge instructions. Pt understands her change in medication. Family was not at bedside at time of discharge instructions. Daughter came to pick up after noon. Pt did not appear to be in distress at time of departure.

## 2015-12-10 NOTE — Progress Notes (Signed)
Occupational Therapy Treatment Patient Details Name: Orella L Eilert MRN: [id number] DOB: 08/31/1940 Today's Date: 12/10/2015    History of present illness s/p L TKA; PMHx: brainstem infarct with balance issues   OT comments  All OT education completed and pt questions answered. No further OT needs. Will sign off.  Follow Up Recommendations  No OT follow up    Equipment Recommendations  None recommended by OT    Recommendations for Other Services      Precautions / Restrictions Precautions Precautions: Fall;Knee Restrictions Weight Bearing Restrictions: No       Mobility Bed Mobility                  Transfers                      Balance                                   ADL Overall ADL's : Needs assistance/impaired Eating/Feeding: Independent;Sitting       Upper Body Bathing: Set up;Sitting   Lower Body Bathing: Minimal assistance;Sit to/from stand   Upper Body Dressing : Set up;Sitting   Lower Body Dressing: Minimal assistance;Sit to/from stand                 General ADL Comments: Patient up in recliner. Reviewed LB bathing and dressing techniques with patient. She will have daughter to assist her at home. Reviewed to not shower unless she has assistance. Pt verbalized understanding of all. No further OT needs. Will sign off.      Vision                     Perception     Praxis      Cognition   Behavior During Therapy: WFL for tasks assessed/performed Overall Cognitive Status: Within Functional Limits for tasks assessed                       Extremity/Trunk Assessment               Exercises     Shoulder Instructions       General Comments      Pertinent Vitals/ Pain       Pain Assessment: No/denies pain  Home Living                                          Prior Functioning/Environment              Frequency           Progress Toward  Goals  OT Goals(current goals can now be found in the care plan section)  Progress towards OT goals: Goals met/education completed, patient discharged from OT  Acute Rehab OT Goals Patient Stated Goal: home today  Plan All goals met and education completed, patient discharged from OT services    Co-evaluation                 End of Session     Activity Tolerance Patient tolerated treatment well   Patient Left in chair;with call bell/phone within reach   Nurse Communication Mobility status        Time: 0956-1015 OT Time Calculation (min): 19 min  Charges: OT General Charges $  OT Visit: 1 Procedure OT Treatments $Self Care/Home Management : 8-22 mins  Early, Leila A 12/10/2015, 10:25 AM    

## 2015-12-10 NOTE — Progress Notes (Addendum)
Physical Therapy Treatment Patient Details Name: Alexandria [name] MRN: [id number] DOB: [birth year] Today's Date: 12/10/2015    History of Present Illness s/p L TKA; PMHx: brainstem infarct with balance issues    PT Comments    tHE PATIENT IS PROGRESSing well. Plans DC today.  Follow Up Recommendations  Home health PT;Outpatient PT     Equipment Recommendations  None recommended by PT    Recommendations for Other Services       Precautions / Restrictions Precautions Precautions: Fall;Knee Restrictions Weight Bearing Restrictions: No    Mobility  Bed Mobility   Bed Mobility: Supine to Sit     Supine to sit: Supervision     General bed mobility comments: cues for L leg psiition  Transfers   Equipment used: Rolling walker (2 wheeled) Transfers: Sit to/from Stand Sit to Stand: Min guard            Ambulation/Gait Ambulation/Gait assistance: Min guard Ambulation Distance (Feet): 250 Feet Assistive device: Rolling walker (2 wheeled) Gait Pattern/deviations: Step-through pattern;Decreased step length - left     General Gait Details: cues for sequence and RW distance from self, cues to place  left foot flatter   Stairs Stairs: Yes Stairs assistance: Min assist Stair Management: No rails;One rail Right;Step to pattern;Forwards;Backwards Number of Stairs: 1 General stair comments: practiced with a hand grip  forward, practiced backward with RW which was safer.  Wheelchair Mobility    Modified Rankin (Stroke Patients Only)       Balance                                    Cognition Arousal/Alertness: Awake/alert Behavior During Therapy: WFL for tasks assessed/performed Overall Cognitive Status: Within Functional Limits for tasks assessed                      Exercises Total Joint Exercises Ankle Circles/Pumps: AROM;Both;10 reps Quad Sets: AROM;Both;10 reps Towel Squeeze: AROM;Both;10 reps Short Arc Quad: AROM;Left;10  reps Heel Slides: AROM;Left;10 reps Hip ABduction/ADduction: AAROM;Left;10 reps Straight Leg Raises: AAROM;Left;10 reps Goniometric ROM: 10-71 LEFT KNEE    General Comments        Pertinent Vitals/Pain Pain Assessment: No/denies pain Pain Score: 4  Pain Location: to flex the knee Pain Descriptors / Indicators: Aching;Tightness Pain Intervention(s): Monitored during session;Limited activity within patient's tolerance;Repositioned;Ice applied;Patient requesting pain meds-RN notified;RN gave pain meds during session    Home Living                      Prior Function            PT Goals (current goals can now be found in the care plan section) Acute Rehab PT Goals Patient Stated Goal: home today Progress towards PT goals: Progressing toward goals    Frequency    7X/week      PT Plan Current plan remains appropriate    Co-evaluation             End of Session   Activity Tolerance: Patient tolerated treatment well Patient left: in chair;with call bell/phone within reach;with chair alarm set     Time: 0910-952 PT Time Calculation (min) (ACUTE ONLY): 16min  Charges:  $Gait Training: 8-22 mins $Therapeutic Exercise: 8-22 mins $Self Care/Home Management: 8-22                    G Codes:  Claretha Cooper 12/10/2015, 11:12 AM

## 2015-12-14 ENCOUNTER — Telehealth: Payer: Self-pay | Admitting: Adult Health

## 2015-12-14 NOTE — Discharge Summary (Signed)
Physician Discharge Summary   Patient ID: Alexandria [name] MRN: [id number] DOB/AGE: 1940-04-30 75 y.o.  Admit date: 12/08/2015 Discharge date: 12/10/2015  Primary Diagnosis: Primary osteoarthritis left knee  Admission Diagnoses:  Past Medical History:  Diagnosis Date  . Allergy   . Anemia    hx of   . Anxiety   . Arthritis    left knee, hands, back  . Asthma    daily and prn inhalers  . 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 problems with balance  . Breast cancer ([place]) 12/2011   right, ER+, PR -, Her 2 -  . COPD (chronic obstructive pulmonary disease) ([place])   . Degenerative joint disease of low back   . Depression   . Dysrhythmia    atrial fib   . GERD (gastroesophageal reflux disease)   . Hearing loss    bilateral hearing aids  . 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 History of kidney stones   . Hx of radiation therapy 03/12/12 -04/13/12   right breast  . Hyperlipidemia   . Hypertension    under control, has been on med. since age 59  . IBS (irritable bowel syndrome)   . Long term current use of aromatase inhibitor 05/08/2012  . Overactive bladder   . Palpitations   . Pneumonia    hx of x 2   . PVD (peripheral vascular disease) ([facility])    varicose veins - left worse than right  . Spinal stenosis   . Stress incontinence   . Stroke [facility])    problem with expressive communicaiton  . Traumatic hemopneumothorax 1982   left   Discharge Diagnoses:   Active Problems:   S/P total knee replacement using cement  Estimated body mass index is 23.86 kg/m as calculated from the following:   Height as of this encounter: '5\' 4"'$  (1.626 m).   Weight as of this encounter: 63 kg (139 lb).  Procedure:  Procedure(s) (LRB): LEFT TOTAL KNEE ARTHROPLASTY (Left)   Consults: None  HPI: Alexandria [name], 75 y.o. female, has a history of pain and functional disability in the left knee due to trauma and arthritis and  has failed non-surgical conservative treatments for greater than 12 weeks to include NSAID's and/or analgesics, corticosteriod injections, viscosupplementation injections, use of assistive devices and activity modification.  Onset of symptoms was gradual, starting >10 years ago with gradually worsening course since that time. The patient noted prior procedures on the knee to include  removal of bone fragments on the lateral aspect of the knee on the left knee(s).  Patient currently rates pain in the left knee(s) at 9 out of 10 with activity. Patient has night pain, worsening of pain with activity and weight bearing, pain that interferes with activities of daily living, pain with passive range of motion, crepitus and joint swelling.  Patient has evidence of periarticular osteophytes and joint space narrowing by imaging studies.  There is no active infection.   Risks, benefits and expectations were discussed with the patient.  Risks including but not limited to the risk of anesthesia, blood clots, nerve damage, blood vessel damage, failure of the prosthesis, infection and up to and including death.  Patient understand the risks, benefits and expectations and wishes to proceed with surgery.   Laboratory Data: Admission on 12/08/2015, Discharged on 12/10/2015  Component Date Value Ref Range Status  . WBC 12/09/2015 11.9* 4.0 - 10.5 K/uL Final  .  RBC 12/09/2015 3.78* 3.87 - 5.11 MIL/uL Final  . Hemoglobin 12/09/2015 10.8* 12.0 - 15.0 g/dL Final  . HCT 05/43/2045 32.0* 36.0 - 46.0 % Final  . MCV 12/09/2015 84.7  78.0 - 100.0 fL Final  . MCH 12/09/2015 28.6  26.0 - 34.0 pg Final  . MCHC 12/09/2015 33.8  30.0 - 36.0 g/dL Final  . RDW 21/78/0291 13.0  11.5 - 15.5 % Final  . Platelets 12/09/2015 193  150 - 400 K/uL Final  . Sodium 12/09/2015 141  135 - 145 mmol/L Final  . Potassium 12/09/2015 3.5  3.5 - 5.1 mmol/L Final  . Chloride 12/09/2015 107  101 - 111 mmol/L Final  . CO2 12/09/2015 26  22 - 32 mmol/L  Final  . Glucose, Bld 12/09/2015 137* 65 - 99 mg/dL Final  . BUN 15/52/9197 16  6 - 20 mg/dL Final  . Creatinine, Ser 12/09/2015 0.83  0.44 - 1.00 mg/dL Final  . Calcium 77/15/4298 8.9  8.9 - 10.3 mg/dL Final  . GFR calc non Af Amer 12/09/2015 >60  >60 mL/min Final  . GFR calc Af Amer 12/09/2015 >60  >60 mL/min Final   Comment: (NOTE) The eGFR has been calculated using the CKD EPI equation. This calculation has not been validated in all clinical situations. eGFR's persistently <60 mL/min signify possible Chronic Kidney Disease.   Eustaquio Boyden gap 12/09/2015 8  5 - 15 Final  Hospital Outpatient Visit on 11/30/2015  Component Date Value Ref Range Status  . ABO/RH(D) 12/08/2015 A POS   Final  . Antibody Screen 12/08/2015 NEG   Final  . Sample Expiration 12/08/2015 12/11/2015   Final  . Extend sample reason 12/08/2015 NO TRANSFUSIONS OR PREGNANCY IN THE PAST 3 MONTHS   Final  . MRSA, PCR 11/30/2015 NEGATIVE  NEGATIVE Final  . Staphylococcus aureus 11/30/2015 NEGATIVE  NEGATIVE Final   Comment:        The Xpert SA Assay (FDA approved for NASAL specimens in patients over 88 years of age), is one component of a comprehensive surveillance program.  Test performance has been validated by [facility] for patients greater than or equal to 49 year old. It is not intended to diagnose infection nor to guide or monitor treatment.   . ABO/RH(D) 11/30/2015 A POS   Final  Office Visit on 11/19/2015  Component Date Value Ref Range Status  . Color, UA 11/19/2015 yellow   Final  . Clarity, UA 11/19/2015 clear   Final  . Glucose, UA 11/19/2015 n   Final  . Bilirubin, UA 11/19/2015 1+   Final  . Ketones, UA 11/19/2015 n   Final  . Spec Grav, UA 11/19/2015 1.020   Final  . Blood, UA 11/19/2015 n   Final  . pH, UA 11/19/2015 6.0   Final  . Protein, UA 11/19/2015 trace   Final  . Urobilinogen, UA 11/19/2015 1.0   Final  . Nitrite, UA 11/19/2015 n   Final  . Leukocytes, UA 11/19/2015 small (1+)*  Negative Final  . Sodium 11/19/2015 143  135 - 145 mEq/L Final  . Potassium 11/19/2015 4.1  3.5 - 5.1 mEq/L Final  . Chloride 11/19/2015 103  96 - 112 mEq/L Final  . CO2 11/19/2015 34* 19 - 32 mEq/L Final  . Glucose, Bld 11/19/2015 84  70 - 99 mg/dL Final  . BUN 59/05/4186 16  6 - 23 mg/dL Final  . Creatinine, Ser 11/19/2015 0.84  0.40 - 1.20 mg/dL Final  . Calcium 85/41/8258 9.5  8.4 - 10.5 mg/dL Final  . GFR 11/19/2015 70.64  >60.00 mL/min Final  . WBC 11/19/2015 6.1  4.0 - 10.5 K/uL Final  . RBC 11/19/2015 4.48  3.87 - 5.11 Mil/uL Final  . Hemoglobin 11/19/2015 13.4  12.0 - 15.0 g/dL Final  . HCT 11/19/2015 39.7  36.0 - 46.0 % Final  . MCV 11/19/2015 88.5  78.0 - 100.0 fl Final  . MCHC 11/19/2015 33.8  30.0 - 36.0 g/dL Final  . RDW 11/19/2015 13.5  11.5 - 15.5 % Final  . Platelets 11/19/2015 221.0  150.0 - 400.0 K/uL Final  . Neutrophils Relative % 11/19/2015 61.1  43.0 - 77.0 % Final  . Lymphocytes Relative 11/19/2015 28.6  12.0 - 46.0 % Final  . Monocytes Relative 11/19/2015 8.0  3.0 - 12.0 % Final  . Eosinophils Relative 11/19/2015 1.5  0.0 - 5.0 % Final  . Basophils Relative 11/19/2015 0.8  0.0 - 3.0 % Final  . Neutro Abs 11/19/2015 3.7  1.4 - 7.7 K/uL Final  . Lymphs Abs 11/19/2015 1.7  0.7 - 4.0 K/uL Final  . Monocytes Absolute 11/19/2015 0.5  0.1 - 1.0 K/uL Final  . Eosinophils Absolute 11/19/2015 0.1  0.0 - 0.7 K/uL Final  . Basophils Absolute 11/19/2015 0.0  0.0 - 0.1 K/uL Final  . Cholesterol 11/19/2015 218* 0 - 200 mg/dL Final  . Triglycerides 11/19/2015 169.0* 0.0 - 149.0 mg/dL Final  . HDL 11/19/2015 50.60  >39.00 mg/dL Final  . VLDL 11/19/2015 33.8  0.0 - 40.0 mg/dL Final  . LDL Cholesterol 11/19/2015 134* 0 - 99 mg/dL Final  . Total CHOL/HDL Ratio 11/19/2015 4   Final  . NonHDL 11/19/2015 167.59   Final  . TSH 11/19/2015 3.82  0.35 - 4.50 uIU/mL Final  . Total Bilirubin 11/19/2015 1.5* 0.2 - 1.2 mg/dL Final  . Bilirubin, Direct 11/19/2015 0.2  0.0 - 0.3 mg/dL  Final  . Alkaline Phosphatase 11/19/2015 63  39 - 117 U/L Final  . AST 11/19/2015 17  0 - 37 U/L Final  . ALT 11/19/2015 10  0 - 35 U/L Final  . Total Protein 11/19/2015 6.9  6.0 - 8.3 g/dL Final  . Albumin 11/19/2015 4.3  3.5 - 5.2 g/dL Final     EKG: Orders placed or performed in visit on 11/26/15  . EKG 12-Lead     Hospital Course: Alexandria [name] is a 75 y.o. who was admitted to [facility]. They were brought to the operating room on 12/08/2015 and underwent Procedure(s): LEFT TOTAL KNEE ARTHROPLASTY.  Patient tolerated the procedure well and was later transferred to the recovery room and then to the orthopaedic floor for postoperative care.  They were given PO and IV analgesics for pain control following their surgery.  They were given 24 hours of postoperative antibiotics of  Anti-infectives    Start     Dose/Rate Route Frequency Ordered Stop   12/08/15 1600  ceFAZolin (ANCEF) IVPB 1 g/50 mL premix     1 g 100 mL/hr over 30 Minutes Intravenous Every 6 hours 12/08/15 1325 12/08/15 2218   12/08/15 0725  vancomycin (VANCOCIN) IVPB 1000 mg/200 mL premix     1,000 mg 200 mL/hr over 60 Minutes Intravenous On call to O.R. 12/08/15 0725 12/08/15 0945     and started on DVT prophylaxis in the form of resumption of Plavix.   PT and OT were ordered for total joint protocol.  Discharge planning consulted to help with postop disposition and equipment  needs.  Patient had a fair night on the evening of surgery.  They started to get up OOB with therapy on day one. Continued to work with therapy into day two. The patient had progressed with therapy and meeting their goals.  Incision was healing well.  Patient was seen in rounds and was ready to go home.   Diet: Cardiac diet Activity:WBAT Follow-up:in 2 weeks Disposition - Home Discharged Condition: stable   Discharge Instructions    Call MD / Call 911    Complete by:  As directed    If you experience chest pain or shortness of  breath, CALL 911 and be transported to the hospital emergency room.  If you develope a fever above 101 F, pus (white drainage) or increased drainage or redness at the wound, or calf pain, call your surgeon's office.   Constipation Prevention    Complete by:  As directed    Drink plenty of fluids.  Prune juice may be helpful.  You may use a stool softener, such as Colace (over the counter) 100 mg twice a day.  Use MiraLax (over the counter) for constipation as needed.   Diet - low sodium heart healthy    Complete by:  As directed    Discharge instructions    Complete by:  As directed    INSTRUCTIONS AFTER JOINT REPLACEMENT   Remove items at home which could result in a fall. This includes throw rugs or furniture in walking pathways ICE to the affected joint every three hours while awake for 30 minutes at a time, for at least the first 3-5 days, and then as needed for pain and swelling.  Continue to use ice for pain and swelling. You may notice swelling that will progress down to the foot and ankle.  This is normal after surgery.  Elevate your leg when you are not up walking on it.   Continue to use the breathing machine you got in the hospital (incentive spirometer) which will help keep your temperature down.  It is common for your temperature to cycle up and down following surgery, especially at night when you are not up moving around and exerting yourself.  The breathing machine keeps your lungs expanded and your temperature down.   DIET:  As you were doing prior to hospitalization, we recommend a well-balanced diet.  DRESSING / WOUND CARE / SHOWERING  Keep the surgical dressing until follow up.  The dressing is water proof, so you can shower without any extra covering.  IF THE DRESSING FALLS OFF or the wound gets wet inside, change the dressing with sterile gauze.  Please use good hand washing techniques before changing the dressing.  Do not use any lotions or creams on the incision until  instructed by your surgeon.    ACTIVITY  Increase activity slowly as tolerated, but follow the weight bearing instructions below.   No driving for 6 weeks or until further direction given by your physician.  You cannot drive while taking narcotics.  No lifting or carrying greater than 10 lbs. until further directed by your surgeon. Avoid periods of inactivity such as sitting longer than an hour when not asleep. This helps prevent blood clots.  You may return to work once you are authorized by your doctor.     WEIGHT BEARING   Weight bearing as tolerated with assist device (walker, cane, etc) as directed, use it as long as suggested by your surgeon or therapist, typically at least 4-6 weeks.  EXERCISES  Results after joint replacement surgery are often greatly improved when you follow the exercise, range of motion and muscle strengthening exercises prescribed by your doctor. Safety measures are also important to protect the joint from further injury. Any time any of these exercises cause you to have increased pain or swelling, decrease what you are doing until you are comfortable again and then slowly increase them. If you have problems or questions, call your caregiver or physical therapist for advice.   Rehabilitation is important following a joint replacement. After just a few days of immobilization, the muscles of the leg can become weakened and shrink (atrophy).  These exercises are designed to build up the tone and strength of the thigh and leg muscles and to improve motion. Often times heat used for twenty to thirty minutes before working out will loosen up your tissues and help with improving the range of motion but do not use heat for the first two weeks following surgery (sometimes heat can increase post-operative swelling).   These exercises can be done on a training (exercise) mat, on the floor, on a table or on a bed. Use whatever works the best and is most comfortable for you.     Use music or television while you are exercising so that the exercises are a pleasant break in your day. This will make your life better with the exercises acting as a break in your routine that you can look forward to.   Perform all exercises about fifteen times, three times per day or as directed.  You should exercise both the operative leg and the other leg as well.  Exercises include:   Quad Sets - Tighten up the muscle on the front of the thigh (Quad) and hold for 5-10 seconds.   Straight Leg Raises - With your knee straight (if you were given a brace, keep it on), lift the leg to 60 degrees, hold for 3 seconds, and slowly lower the leg.  Perform this exercise against resistance later as your leg gets stronger.  Leg Slides: Lying on your back, slowly slide your foot toward your buttocks, bending your knee up off the floor (only go as far as is comfortable). Then slowly slide your foot back down until your leg is flat on the floor again.  Angel Wings: Lying on your back spread your legs to the side as far apart as you can without causing discomfort.  Hamstring Strength:  Lying on your back, push your heel against the floor with your leg straight by tightening up the muscles of your buttocks.  Repeat, but this time bend your knee to a comfortable angle, and push your heel against the floor.  You may put a pillow under the heel to make it more comfortable if necessary.   A rehabilitation program following joint replacement surgery can speed recovery and prevent re-injury in the future due to weakened muscles. Contact your doctor or a physical therapist for more information on knee rehabilitation.    CONSTIPATION  Constipation is defined medically as fewer than three stools per week and severe constipation as less than one stool per week.  Even if you have a regular bowel pattern at home, your normal regimen is likely to be disrupted due to multiple reasons following surgery.  Combination of  anesthesia, postoperative narcotics, change in appetite and fluid intake all can affect your bowels.   YOU MUST use at least one of the following options; they are listed in order of  increasing strength to get the job done.  They are all available over the counter, and you may need to use some, POSSIBLY even all of these options:    Drink plenty of fluids (prune juice may be helpful) and high fiber foods Colace 100 mg by mouth twice a day  Senokot for constipation as directed and as needed Dulcolax (bisacodyl), take with full glass of water  Miralax (polyethylene glycol) once or twice a day as needed.  If you have tried all these things and are unable to have a bowel movement in the first 3-4 days after surgery call either your surgeon or your primary doctor.    If you experience loose stools or diarrhea, hold the medications until you stool forms back up.  If your symptoms do not get better within 1 week or if they get worse, check with your doctor.  If you experience "the worst abdominal pain ever" or develop nausea or vomiting, please contact the office immediately for further recommendations for treatment.   ITCHING:  If you experience itching with your medications, try taking only a single pain pill, or even half a pain pill at a time.  You can also use Benadryl over the counter for itching or also to help with sleep.   TED HOSE STOCKINGS:  Use stockings on both legs until for at least 2 weeks or as directed by physician office. They may be removed at night for sleeping.  MEDICATIONS:  See your medication summary on the "After Visit Summary" that nursing will review with you.  You may have some home medications which will be placed on hold until you complete the course of blood thinner medication.  It is important for you to complete the blood thinner medication as prescribed.  PRECAUTIONS:  If you experience chest pain or shortness of breath - call 911 immediately for transfer to the  hospital emergency department.   If you develop a fever greater that 101 F, purulent drainage from wound, increased redness or drainage from wound, foul odor from the wound/dressing, or calf pain - CONTACT YOUR SURGEON.                                                   FOLLOW-UP APPOINTMENTS:  If you do not already have a post-op appointment, please call the office for an appointment to be seen by your surgeon.  Guidelines for how soon to be seen are listed in your "After Visit Summary", but are typically between 1-4 weeks after surgery.   MAKE SURE YOU:  Understand these instructions.  Get help right away if you are not doing well or get worse.    Thank you for letting us be a part of your medical care team.  It is a privilege we respect greatly.  We hope these instructions will help you stay on track for a fast and full recovery!   Increase activity slowly as tolerated    Complete by:  As directed        Medication List    TAKE these medications   ADVAIR DISKUS 100-50 MCG/DOSE Aepb Generic drug:  Fluticasone-Salmeterol INHALE 1 PUFF TWICE DAILY AT 10 AM AND 5 PM.   albuterol 108 (90 Base) MCG/ACT inhaler Commonly known as:  VENTOLIN HFA INHALE 2 PUFFS INTO THE LUNGS EVERY 6 HOURS AS NEEDED FOR  SHORTNESS OF BREATH AND WHEEZING.   ALIGN PO Take 1 tablet by mouth daily.   amLODipine 5 MG tablet Commonly known as:  NORVASC Take 10 mg by mouth daily. What changed:  Another medication with the same name was removed. Continue taking this medication, and follow the directions you see here.   anastrozole 1 MG tablet Commonly known as:  ARIMIDEX TAKE 1 TABLET ONCE DAILY.   atorvastatin 20 MG tablet Commonly known as:  LIPITOR Take 1 tablet (20 mg total) by mouth once. Twice weekly What changed:  when to take this  additional instructions   b complex vitamins tablet Take 1 tablet by mouth daily. PLUS FOLIC ACID PLUS VIT. C   Biotin 5000 MCG Caps Take 1 capsule by mouth  daily.   clopidogrel 75 MG tablet Commonly known as:  PLAVIX TAKE 1 TABLET DAILY. What changed:  how much to take  how to take this  when to take this  additional instructions   co-enzyme Q-10 50 MG capsule Take 200 mg by mouth daily.   escitalopram 10 MG tablet Commonly known as:  LEXAPRO TAKE 1/2 TO 1 TABLET AT BEDTIME. What changed:  See the new instructions.   ferrous sulfate 325 (65 FE) MG tablet Take 1 tablet (325 mg total) by mouth 2 (two) times daily with a meal.   fluticasone 50 MCG/ACT nasal spray Commonly known as:  FLONASE Place 2 sprays into both nostrils daily.   glucosamine-chondroitin 500-400 MG tablet Take 1 tablet by mouth 2 (two) times daily. 1500/1200 MG   hydrochlorothiazide 12.5 MG capsule Commonly known as:  MICROZIDE TAKE 1 CAPSULE IN THE MORNING AS NEEDED. What changed:  how much to take  how to take this  when to take this  additional instructions   HYDROcodone-acetaminophen 7.5-325 MG tablet Commonly known as:  NORCO Take 1 tablet by mouth every 4 (four) hours as needed (breakthrough pain).   loratadine 10 MG tablet Commonly known as:  CLARITIN Take 10 mg by mouth daily as needed for allergies.   methocarbamol 500 MG tablet Commonly known as:  ROBAXIN Take 1 tablet (500 mg total) by mouth every 6 (six) hours as needed for muscle spasms.   pantoprazole 40 MG tablet Commonly known as:  PROTONIX TAKE 1 TABLET EACH DAY. What changed:  how much to take  how to take this  when to take this  additional instructions   potassium chloride SA 20 MEQ tablet Commonly known as:  K-DUR,KLOR-CON TAKE 3 TABLETS DAILY. What changed:  how much to take  how to take this  when to take this  additional instructions   ranitidine 150 MG tablet Commonly known as:  ZANTAC Take 150 mg by mouth at bedtime as needed for heartburn.   Selenium 200 MCG Caps Take 200 mcg by mouth daily.   SYSTANE OP Place 1 drop into both eyes  daily as needed. Dry eyes   TYLENOL ARTHRITIS EXT RELIEF PO Take 650 mg by mouth daily.   Vitamin D-3 5000 UNITS Tabs Take 5,000 Units by mouth daily.   ZOFRAN 4 MG tablet Generic drug:  ondansetron Take 4 mg by mouth every 8 (eight) hours as needed for nausea or vomiting.      Follow-up Information    Mauri Pole, MD. Schedule an appointment as soon as possible for a visit in 2 week(s).   Specialty:  Orthopedic Surgery Contact information: [address] [place] [address] [phone]  [facility] .   Why:  now known as [facility].  This agency will call you at home to schedule your at home physical therapy Contact information: [address] [place] [address] [phone]           Signed: Ardeen Jourdain, PA-C Orthopaedic Surgery 12/14/2015, 8:54 AM

## 2015-12-14 NOTE — Telephone Encounter (Signed)
OK but would get urine cx of any abnormalities on urine dip.

## 2015-12-14 NOTE — Telephone Encounter (Signed)
I called Alexandria [name] and gave a verbal order per the message below for a urinalysis and culture if needed and she agreed.

## 2015-12-14 NOTE — Telephone Encounter (Signed)
Alexandria [name] with [facility] would like to have a order to do a urinalysis on this pt possible UTI.

## 2015-12-25 ENCOUNTER — Other Ambulatory Visit: Payer: Self-pay | Admitting: Hematology and Oncology

## 2015-12-25 DIAGNOSIS — Z853 Personal history of malignant neoplasm of breast: Secondary | ICD-10-CM

## 2015-12-26 ENCOUNTER — Other Ambulatory Visit: Payer: Self-pay | Admitting: Adult Health

## 2016-01-11 ENCOUNTER — Ambulatory Visit
Admission: RE | Admit: 2016-01-11 | Discharge: 2016-01-11 | Disposition: A | Discharge status: Home / Self Care | Payer: Medicare Other | Source: Ambulatory Visit | Attending: Hematology and Oncology | Admitting: Hematology and Oncology

## 2016-01-11 DIAGNOSIS — Z853 Personal history of malignant neoplasm of breast: Secondary | ICD-10-CM

## 2016-01-13 ENCOUNTER — Other Ambulatory Visit: Payer: Self-pay | Admitting: Family Medicine

## 2016-01-13 DIAGNOSIS — E785 Hyperlipidemia, unspecified: Secondary | ICD-10-CM

## 2016-01-14 NOTE — Telephone Encounter (Signed)
PCP is [facility]

## 2016-01-22 ENCOUNTER — Ambulatory Visit (INDEPENDENT_AMBULATORY_CARE_PROVIDER_SITE_OTHER): Payer: Medicare Other

## 2016-01-22 DIAGNOSIS — Z23 Encounter for immunization: Secondary | ICD-10-CM | POA: Diagnosis not present

## 2016-02-03 ENCOUNTER — Other Ambulatory Visit: Payer: Self-pay | Admitting: Family Medicine

## 2016-02-03 DIAGNOSIS — K219 Gastro-esophageal reflux disease without esophagitis: Secondary | ICD-10-CM

## 2016-03-16 ENCOUNTER — Other Ambulatory Visit: Payer: Self-pay | Admitting: Family Medicine

## 2016-03-16 DIAGNOSIS — I639 Cerebral infarction, unspecified: Secondary | ICD-10-CM

## 2016-03-17 NOTE — Telephone Encounter (Signed)
Ok to refill for one year  

## 2016-03-17 NOTE — Telephone Encounter (Signed)
Pt would like a refill pls advise.

## 2016-03-21 ENCOUNTER — Other Ambulatory Visit: Payer: Self-pay | Admitting: Adult Health

## 2016-04-07 ENCOUNTER — Other Ambulatory Visit: Payer: Self-pay | Admitting: Adult Health

## 2016-04-07 ENCOUNTER — Other Ambulatory Visit: Payer: Self-pay | Admitting: Family Medicine

## 2016-04-07 DIAGNOSIS — E785 Hyperlipidemia, unspecified: Secondary | ICD-10-CM

## 2016-04-07 DIAGNOSIS — I10 Essential (primary) hypertension: Secondary | ICD-10-CM

## 2016-04-07 NOTE — Telephone Encounter (Signed)
Ok to refill for one year  

## 2016-04-07 NOTE — Telephone Encounter (Signed)
Ok to refill 

## 2016-04-11 NOTE — Assessment & Plan Note (Signed)
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 She is in significantly less pain than before.  Severe stress: Patient's mother passed away Last year and she is in the process of selling off several of her houses and is somewhat stressed out about all of this. She did not go into her mother's house because of issues with her sister. She tells me that she is working on relieving her stress.  Breast Cancer Surveillance: 1. Breast exam 04/12/16: Normal 2. Mammogram 01/11/2016: Benign Return to clinic in 1 year for follow-up

## 2016-04-12 ENCOUNTER — Encounter: Payer: Self-pay | Admitting: Hematology and Oncology

## 2016-04-12 ENCOUNTER — Ambulatory Visit (HOSPITAL_BASED_OUTPATIENT_CLINIC_OR_DEPARTMENT_OTHER): Payer: Medicare Other | Admitting: Hematology and Oncology

## 2016-04-12 DIAGNOSIS — M8580 Other specified disorders of bone density and structure, unspecified site: Secondary | ICD-10-CM

## 2016-04-12 DIAGNOSIS — C50211 Malignant neoplasm of upper-inner quadrant of right female breast: Secondary | ICD-10-CM

## 2016-04-12 DIAGNOSIS — Z79811 Long term (current) use of aromatase inhibitors: Secondary | ICD-10-CM

## 2016-04-12 DIAGNOSIS — Z170 Estrogen receptor positive status [ER+]: Secondary | ICD-10-CM | POA: Diagnosis not present

## 2016-04-12 NOTE — Progress Notes (Signed)
Patient Care Team: [name], NP as PCP - General (Family Medicine)  DIAGNOSIS:  Encounter Diagnosis  Name Primary?  . Malignant neoplasm of upper-inner quadrant of right breast in female, estrogen receptor positive ([place])     SUMMARY OF ONCOLOGIC HISTORY:   Breast cancer of upper-inner quadrant of right female breast ([place])   01/04/2012 Surgery    Right breast lumpectomy and a 1.6 cm IDC with negative margins, 1 SLN negative grade 1 with low-grade DCIS, ER 100%, PR 0%, HER-2 negative ratio 1.14, Ki-67 27% T1 C. N0 M0 stage IA Oncotype DX score 9 7%ROR      02/28/2012 - 04/04/2012 Radiation Therapy    Adjuvant radiation therapy      05/08/2012 -  Anti-estrogen oral therapy    Arimidex 1 mg daily  5 years is the treatment plan       CHIEF COMPLIANT: Follow-up on Arimidex therapy  INTERVAL HISTORY: Alexandria [name] is a 76 year old with above-mentioned history of right breast cancer treated with lumpectomy and adjuvant radiation is currently on Arimidex therapy. She completed 4 years of treatment. She complains of tendinitis in her wrist and had trigger finger. She had a knee replacement surgery and has completed recovery from it and physical therapy. She is doing a lot better from that.  REVIEW OF SYSTEMS:   Constitutional: Denies fevers, chills or abnormal weight loss Eyes: Denies blurriness of vision Ears, nose, mouth, throat, and face: Denies mucositis or sore throat Respiratory: Denies cough, dyspnea or wheezes Cardiovascular: Denies palpitation, chest discomfort Gastrointestinal:  Denies nausea, heartburn or change in bowel habits Skin: Denies abnormal skin rashes Lymphatics: Denies new lymphadenopathy or easy bruising Neurological:Denies numbness, tingling or new weaknesses Behavioral/Psych: Mood is stable, no new changes  Extremities: Tendinitis and inflammation and arthritis in her hands, trigger finger Breast:  denies any pain or lumps or nodules in either  breasts All other systems were reviewed with the patient and are negative.  I have reviewed the past medical history, past surgical history, social history and family history with the patient and they are unchanged from previous note.  ALLERGIES:  is allergic to aspirin; chocolate; coffee bean extract; ibuprofen; oxycodone hcl; penicillins; shrimp [shellfish allergy]; sulfonamide derivatives; and augmentin [amoxicillin-pot clavulanate].  MEDICATIONS:  Current Outpatient Prescriptions  Medication Sig Dispense Refill  . Acetaminophen (TYLENOL ARTHRITIS EXT RELIEF PO) Take 650 mg by mouth daily.    Marland Kitchen ADVAIR DISKUS 100-50 MCG/DOSE AEPB INHALE 1 PUFF TWICE DAILY AT 10 AM AND 5 PM. 60 each 0  . albuterol (VENTOLIN HFA) 108 (90 BASE) MCG/ACT inhaler INHALE 2 PUFFS INTO THE LUNGS EVERY 6 HOURS AS NEEDED FOR SHORTNESS OF BREATH AND WHEEZING. 18 g 3  . amLODipine (NORVASC) 5 MG tablet TAKE 1 TABLET DAILY. 90 tablet 1  . anastrozole (ARIMIDEX) 1 MG tablet TAKE 1 TABLET ONCE DAILY. 90 tablet 3  . atorvastatin (LIPITOR) 20 MG tablet TAKE (1) TABLET TWO TIMES A WEEK. 104 tablet 3  . b complex vitamins tablet Take 1 tablet by mouth daily. PLUS FOLIC ACID PLUS VIT. C    . Biotin 5000 MCG CAPS Take 1 capsule by mouth daily.    . Cholecalciferol (VITAMIN D-3) 5000 UNITS TABS Take 5,000 Units by mouth daily.     . clopidogrel (PLAVIX) 75 MG tablet TAKE 1 TABLET DAILY. 90 tablet 3  . co-enzyme Q-10 50 MG capsule Take 200 mg by mouth daily.    Marland Kitchen escitalopram (LEXAPRO) 10 MG tablet TAKE 1/2 TO  1 TABLET AT BEDTIME. 90 tablet 2  . ferrous sulfate 325 (65 FE) MG tablet Take 1 tablet (325 mg total) by mouth 2 (two) times daily with a meal. 30 tablet 0  . fluticasone (FLONASE) 50 MCG/ACT nasal spray Place 2 sprays into both nostrils daily. 16 g 11  . glucosamine-chondroitin 500-400 MG tablet Take 1 tablet by mouth 2 (two) times daily. 1500/1200 MG    . hydrochlorothiazide (MICROZIDE) 12.5 MG capsule TAKE 1 CAPSULE IN  THE MORNING AS NEEDED. 90 capsule 4  . HYDROcodone-acetaminophen (NORCO) 7.5-325 MG tablet Take 1 tablet by mouth every 4 (four) hours as needed (breakthrough pain). 60 tablet 0  . loratadine (CLARITIN) 10 MG tablet Take 10 mg by mouth daily as needed for allergies.     . methocarbamol (ROBAXIN) 500 MG tablet Take 1 tablet (500 mg total) by mouth every 6 (six) hours as needed for muscle spasms. 60 tablet 0  . ondansetron (ZOFRAN) 4 MG tablet Take 4 mg by mouth every 8 (eight) hours as needed for nausea or vomiting.    . pantoprazole (PROTONIX) 40 MG tablet TAKE 1 TABLET DAILY. 90 tablet 0  . Polyethyl Glycol-Propyl Glycol (SYSTANE OP) Place 1 drop into both eyes daily as needed. Dry eyes    . potassium chloride SA (K-DUR,KLOR-CON) 20 MEQ tablet TAKE 3 TABLETS DAILY. (Patient taking differently: Take 40 mEq by mouth daily. ) 270 tablet 3  . Probiotic Product (ALIGN PO) Take 1 tablet by mouth daily.     . ranitidine (ZANTAC) 150 MG tablet Take 150 mg by mouth at bedtime as needed for heartburn.    . Selenium 200 MCG CAPS Take 200 mcg by mouth daily.     No current facility-administered medications for this visit.     PHYSICAL EXAMINATION: ECOG PERFORMANCE STATUS: 1 - Symptomatic but completely ambulatory  Vitals:   04/12/16 1102  BP: 134/60  Pulse: 67  Resp: 18  Temp: 97.9 F (36.6 C)   Filed Weights   04/12/16 1102  Weight: 137 lb 4.8 oz (62.3 kg)    GENERAL:alert, no distress and comfortable SKIN: skin color, texture, turgor are normal, no rashes or significant lesions EYES: normal, Conjunctiva are pink and non-injected, sclera clear OROPHARYNX:no exudate, no erythema and lips, buccal mucosa, and tongue normal  NECK: supple, thyroid normal size, non-tender, without nodularity LYMPH:  no palpable lymphadenopathy in the cervical, axillary or inguinal LUNGS: clear to auscultation and percussion with normal breathing effort HEART: regular rate & rhythm and no murmurs and no lower  extremity edema ABDOMEN:abdomen soft, non-tender and normal bowel sounds MUSCULOSKELETAL:no cyanosis of digits and no clubbing  NEURO: alert & oriented x 3 with fluent speech, no focal motor/sensory deficits EXTREMITIES: No lower extremity edema BREAST: No palpable masses or nodules in either right or left breasts. No palpable axillary supraclavicular or infraclavicular adenopathy no breast tenderness or nipple discharge. (exam performed in the presence of a chaperone)  LABORATORY DATA:  I have reviewed the data as listed   Chemistry      Component Value Date/Time   NA 141 12/09/2015 0439   NA 145 10/06/2014 1036   K 3.5 12/09/2015 0439   K 4.8 10/06/2014 1036   CL 107 12/09/2015 0439   CL 106 08/01/2012 1021   CO2 26 12/09/2015 0439   CO2 30 (H) 10/06/2014 1036   BUN 16 12/09/2015 0439   BUN 16.9 10/06/2014 1036   CREATININE 0.83 12/09/2015 0439   CREATININE 0.9 10/06/2014 1036  Component Value Date/Time   CALCIUM 8.9 12/09/2015 0439   CALCIUM 9.6 10/06/2014 1036   ALKPHOS 63 11/19/2015 0919   ALKPHOS 64 10/06/2014 1036   AST 17 11/19/2015 0919   AST 19 10/06/2014 1036   ALT 10 11/19/2015 0919   ALT 13 10/06/2014 1036   BILITOT 1.5 (H) 11/19/2015 0919   BILITOT 1.55 (H) 10/06/2014 1036       Lab Results  Component Value Date   WBC 11.9 (H) 12/09/2015   HGB 10.8 (L) 12/09/2015   HCT 32.0 (L) 12/09/2015   MCV 84.7 12/09/2015   PLT 193 12/09/2015   NEUTROABS 3.7 11/19/2015    ASSESSMENT & PLAN:  Breast cancer of upper-inner quadrant of right female breast Right breast invasive ductal carcinoma that is postlumpectomy 1 SLN -1.6 cm ER positive, PR negative, HER-2 negative, Oncotype DX recurrence score 9, 7% risk of recurrence, status post radiation and currently on Arimidex and started 05/08/2012  Arimidex related toxicities: Osteopenia: Recent bone density April 2016 showed T score -1.2 osteopenia mild: Patient takes calcium with vitamin D Occasional hot  flashes  Right chest wall pain: Multiple chr rib fractures (CXR July 2016) Chronic back pain: Patient is getting chiropractic treatments which have been of tremendous help. She is in significantly less pain than before. Tendinitis and trigger finger: I instructed the patient to hold anastrozole for 2 weeks since to see if it response. If it does get better then we may switch her to letrozole. She will call us and discuss this with [place] on the phone.  Breast Cancer Surveillance: 1. Breast exam 04/12/16: Normal 2. Mammogram 01/11/2016: Benign Return to clinic in 1 year for follow-up  I spent 15 minutes talking to the patient of which more than half was spent in counseling and coordination of care.  No orders of the defined types were placed in this encounter.  The patient has a good understanding of the overall plan. she agrees with it. she will call with any problems that may develop before the next visit here.   Rulon Eisenmenger, MD 04/12/16

## 2016-05-10 ENCOUNTER — Other Ambulatory Visit: Payer: Self-pay | Admitting: Family Medicine

## 2016-05-10 DIAGNOSIS — K219 Gastro-esophageal reflux disease without esophagitis: Secondary | ICD-10-CM

## 2016-06-17 ENCOUNTER — Other Ambulatory Visit: Payer: Self-pay | Admitting: Family Medicine

## 2016-08-11 ENCOUNTER — Other Ambulatory Visit: Payer: Self-pay | Admitting: Adult Health

## 2016-08-11 DIAGNOSIS — K219 Gastro-esophageal reflux disease without esophagitis: Secondary | ICD-10-CM

## 2016-08-24 ENCOUNTER — Other Ambulatory Visit: Payer: Self-pay | Admitting: Family Medicine

## 2016-08-24 DIAGNOSIS — I10 Essential (primary) hypertension: Secondary | ICD-10-CM

## 2016-09-09 ENCOUNTER — Other Ambulatory Visit: Payer: Self-pay | Admitting: Orthopedic Surgery

## 2016-09-09 DIAGNOSIS — M75101 Unspecified rotator cuff tear or rupture of right shoulder, not specified as traumatic: Principal | ICD-10-CM

## 2016-09-09 DIAGNOSIS — M12811 Other specific arthropathies, not elsewhere classified, right shoulder: Secondary | ICD-10-CM

## 2016-09-22 ENCOUNTER — Ambulatory Visit
Admission: RE | Admit: 2016-09-22 | Discharge: 2016-09-22 | Disposition: A | Discharge status: Home / Self Care | Payer: Medicare Other | Source: Ambulatory Visit | Attending: Orthopedic Surgery | Admitting: Orthopedic Surgery

## 2016-09-22 DIAGNOSIS — M75101 Unspecified rotator cuff tear or rupture of right shoulder, not specified as traumatic: Principal | ICD-10-CM

## 2016-09-22 DIAGNOSIS — M12811 Other specific arthropathies, not elsewhere classified, right shoulder: Secondary | ICD-10-CM

## 2016-09-23 ENCOUNTER — Other Ambulatory Visit: Payer: Self-pay | Admitting: Adult Health

## 2016-11-07 ENCOUNTER — Emergency Department (HOSPITAL_COMMUNITY)
Admission: EM | Admit: 2016-11-07 | Discharge: 2016-11-07 | Disposition: A | Discharge status: Home / Self Care | Payer: Medicare Other | Attending: Emergency Medicine | Admitting: Emergency Medicine

## 2016-11-07 ENCOUNTER — Emergency Department (HOSPITAL_COMMUNITY): Payer: Medicare Other

## 2016-11-07 ENCOUNTER — Other Ambulatory Visit: Payer: Self-pay

## 2016-11-07 DIAGNOSIS — S3992XA Unspecified injury of lower back, initial encounter: Secondary | ICD-10-CM | POA: Diagnosis present

## 2016-11-07 DIAGNOSIS — Z96652 Presence of left artificial knee joint: Secondary | ICD-10-CM | POA: Insufficient documentation

## 2016-11-07 DIAGNOSIS — Y92009 Unspecified place in unspecified non-institutional (private) residence as the place of occurrence of the external cause: Secondary | ICD-10-CM | POA: Insufficient documentation

## 2016-11-07 DIAGNOSIS — Z87891 Personal history of nicotine dependence: Secondary | ICD-10-CM | POA: Insufficient documentation

## 2016-11-07 DIAGNOSIS — S0990XA Unspecified injury of head, initial encounter: Secondary | ICD-10-CM | POA: Diagnosis not present

## 2016-11-07 DIAGNOSIS — Y999 Unspecified external cause status: Secondary | ICD-10-CM | POA: Insufficient documentation

## 2016-11-07 DIAGNOSIS — Y939 Activity, unspecified: Secondary | ICD-10-CM | POA: Insufficient documentation

## 2016-11-07 DIAGNOSIS — Z79899 Other long term (current) drug therapy: Secondary | ICD-10-CM | POA: Diagnosis not present

## 2016-11-07 DIAGNOSIS — M4850XA Collapsed vertebra, not elsewhere classified, site unspecified, initial encounter for fracture: Secondary | ICD-10-CM

## 2016-11-07 DIAGNOSIS — J449 Chronic obstructive pulmonary disease, unspecified: Secondary | ICD-10-CM | POA: Diagnosis not present

## 2016-11-07 DIAGNOSIS — J45909 Unspecified asthma, uncomplicated: Secondary | ICD-10-CM | POA: Diagnosis not present

## 2016-11-07 DIAGNOSIS — M4854XA Collapsed vertebra, not elsewhere classified, thoracic region, initial encounter for fracture: Secondary | ICD-10-CM | POA: Diagnosis not present

## 2016-11-07 DIAGNOSIS — W07XXXA Fall from chair, initial encounter: Secondary | ICD-10-CM | POA: Diagnosis not present

## 2016-11-07 DIAGNOSIS — M7918 Myalgia, other site: Secondary | ICD-10-CM

## 2016-11-07 DIAGNOSIS — IMO0001 Reserved for inherently not codable concepts without codable children: Secondary | ICD-10-CM

## 2016-11-07 DIAGNOSIS — I10 Essential (primary) hypertension: Secondary | ICD-10-CM | POA: Insufficient documentation

## 2016-11-07 MED ORDER — HYDROCODONE-ACETAMINOPHEN 5-325 MG PO TABS
2.0000 | ORAL_TABLET | Freq: Once | ORAL | Status: AC
  Administered 2016-11-07: 2 via ORAL
  Filled 2016-11-07: qty 2

## 2016-11-07 MED ORDER — HYDROCODONE-ACETAMINOPHEN 5-325 MG PO TABS: ORAL_TABLET | ORAL | 0 refills | Status: DC

## 2016-11-07 MED ORDER — FENTANYL CITRATE (PF) 100 MCG/2ML IJ SOLN
50.0000 ug | Freq: Once | INTRAMUSCULAR | Status: AC
  Administered 2016-11-07: 50 ug via INTRAVENOUS
  Filled 2016-11-07: qty 2

## 2016-11-07 NOTE — ED Notes (Signed)
Pt ambulated in hall. Pt assisted by 2. Somewhat wobbly on feet, but able to control her balance.

## 2016-11-07 NOTE — ED Triage Notes (Signed)
Pt arrives EMS from home with c/o fall from standing hitting head. Denies LOC. Pt c/o back pain and remains in bent posture side lying as position of comfort. Given fentanyl 50 mcg and zofran f4 mg. IV PTA

## 2016-11-07 NOTE — ED Notes (Signed)
Pt states she understands instructions. Home stable with family via wc.

## 2016-11-07 NOTE — ED Provider Notes (Signed)
[facility] DEPT Provider Note   CSN: [id number] Arrival date & time: 11/07/16  1342     History   Chief Complaint Chief Complaint  Patient presents with  . Fall  . Back Pain    HPI Alexandria [name] is a 76 y.o. female.  The history is provided by the patient. No language interpreter was used.  Fall  This is a new problem. The current episode started 1 to 2 hours ago. The problem occurs constantly. The problem has not changed since onset.Associated symptoms include headaches. Pertinent negatives include no chest pain. The symptoms are aggravated by exertion. Nothing relieves the symptoms. She has tried nothing for the symptoms. The treatment provided no relief.  Back Pain   Associated symptoms include headaches. Pertinent negatives include no chest pain.   Pt reports she fell off of a cart that she normally sits on.  Pt hit her head and has a knot on her head.  Pt hit her low back.  Pt reports she has pain all the way up her spine.  Pt did not lose consciousness,  Pt is on plavix. Past Medical History:  Diagnosis Date  . Allergy   . Anemia    hx of   . Anxiety   . Arthritis    left knee, hands, back  . Asthma    daily and prn inhalers  . 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 problems with balance  . Breast cancer ([place]) 12/2011   right, ER+, PR -, Her 2 -  . COPD (chronic obstructive pulmonary disease) ([place])   . Degenerative joint disease of low back   . Depression   . Dysrhythmia    atrial fib   . GERD (gastroesophageal reflux disease)   . Hearing loss    bilateral hearing aids  . 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 History of kidney stones   . Hx of radiation therapy 03/12/12 -04/13/12   right breast  . Hyperlipidemia   . Hypertension    under control, has been on med. since age 60  . IBS (irritable bowel syndrome)   . Long term current use of aromatase inhibitor 05/08/2012  . Overactive  bladder   . Palpitations   . Pneumonia    hx of x 2   . PVD (peripheral vascular disease) ([facility])    varicose veins - left worse than right  . Spinal stenosis   . Stress incontinence   . Stroke [facility])    problem with expressive communicaiton  . Traumatic hemopneumothorax 1982   left    Patient Active Problem List   Diagnosis Date Noted  . 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 Atrial fibrillation ([place]) 11/19/2015  . Anxiety and depression 07/09/2015  . Varicose veins of bilateral lower extremities with other complications 21/97/5883  . Essential hypertension, benign 12/08/2014  . Acute hemorrhagic cystitis 02/18/2014  . Stroke ([place]) 05/15/2012  . Spinal stenosis   . COPD (chronic obstructive pulmonary disease) ([place])   . Long term current use of aromatase inhibitor 05/08/2012  . Hx of radiation therapy   . Breast cancer of upper-inner quadrant of right female breast ([place]) 12/23/2011  . Ataxia 06/23/2011  . Contact dermatitis and eczema due to plant 01/04/2011  . BACK PAIN 08/11/2009  . PALPITATIONS, RECURRENT 02/27/2009  . PERS HX TOBACCO USE PRESENTING HAZARDS HEALTH 12/17/2008  . Hyperlipidemia 02/26/2008  . ASTHMA 02/26/2008  .  GERD 02/12/2007    Past Surgical History:  Procedure Laterality Date  . ANKLE HARDWARE REMOVAL     right  . BREAST BIOPSY  1976   right  . BREAST LUMPECTOMY  2013   right with snbx  . CHOLECYSTECTOMY  1990s  . CYSTOSCOPY  07/05/2005  . KNEE SURGERY  1980s   left  . ORIF ANKLE FRACTURE  1980s   right   . TOTAL KNEE ARTHROPLASTY Left 12/08/2015   Procedure: LEFT TOTAL KNEE ARTHROPLASTY;  Surgeon: Paralee Cancel, MD;  Location: [facility] ORS;  Service: Orthopedics;  Laterality: Left;  . TRANSOBTURATOR SLING  07/05/2005  . TUBAL LIGATION  1976  . VEIN SURGERY     vein ablation left leg    OB History    No data available       Home Medications    Prior to Admission medications   Medication Sig Start Date End Date Taking?  Authorizing Provider  Acetaminophen (TYLENOL ARTHRITIS EXT RELIEF PO) Take 650 mg by mouth daily.    [provider]  ADVAIR DISKUS 100-50 MCG/DOSE AEPB INHALE 1 PUFF TWICE DAILY AT 10 AM AND 5 PM. 06/20/16   Dorena Cookey, MD  albuterol (VENTOLIN HFA) 108 (90 BASE) MCG/ACT inhaler INHALE 2 PUFFS INTO THE LUNGS EVERY 6 HOURS AS NEEDED FOR SHORTNESS OF BREATH AND WHEEZING. 11/20/13   Dorena Cookey, MD  amLODipine (NORVASC) 5 MG tablet TAKE 1 TABLET DAILY. 09/27/16   Nafziger, Tommi Rumps, NP  anastrozole (ARIMIDEX) 1 MG tablet TAKE 1 TABLET ONCE DAILY. 11/04/15   Nicholas Lose, MD  atorvastatin (LIPITOR) 20 MG tablet TAKE (1) TABLET TWO TIMES A WEEK. 04/07/16   Nafziger, Tommi Rumps, NP  b complex vitamins tablet Take 1 tablet by mouth daily. PLUS FOLIC ACID PLUS VIT. C    [provider]  Biotin 5000 MCG CAPS Take 1 capsule by mouth daily.    [provider]  Cholecalciferol (VITAMIN D-3) 5000 UNITS TABS Take 5,000 Units by mouth daily.     [provider]  clopidogrel (PLAVIX) 75 MG tablet TAKE 1 TABLET DAILY. 03/17/16   Nafziger, Tommi Rumps, NP  co-enzyme Q-10 50 MG capsule Take 200 mg by mouth daily.    [provider]  escitalopram (LEXAPRO) 10 MG tablet TAKE 1/2 TO 1 TABLET AT BEDTIME. 12/28/15   Nafziger, Tommi Rumps, NP  fluticasone (FLONASE) 50 MCG/ACT nasal spray Place 2 sprays into both nostrils daily. 11/20/13   Dorena Cookey, MD  hydrochlorothiazide (MICROZIDE) 12.5 MG capsule TAKE 1 CAPSULE IN THE MORNING AS NEEDED. 04/11/16   Dorena Cookey, MD  loratadine (CLARITIN) 10 MG tablet Take 10 mg by mouth daily as needed for allergies.     [provider]  methocarbamol (ROBAXIN) 500 MG tablet Take 1 tablet (500 mg total) by mouth every 6 (six) hours as needed for muscle spasms. 12/08/15   Constable, Amber, PA-C  ondansetron (ZOFRAN) 4 MG tablet Take 4 mg by mouth every 8 (eight) hours as needed for nausea or vomiting.    [provider]  pantoprazole  (PROTONIX) 40 MG tablet TAKE 1 TABLET DAILY. 08/11/16   Nafziger, Tommi Rumps, NP  Polyethyl Glycol-Propyl Glycol (SYSTANE OP) Place 1 drop into both eyes daily as needed. Dry eyes    [provider]  potassium chloride SA (K-DUR,KLOR-CON) 20 MEQ tablet TAKE 3 TABLETS DAILY. 08/24/16   Dorena Cookey, MD  Probiotic Product (ALIGN PO) Take 1 tablet by mouth daily.     [provider]  ranitidine (ZANTAC) 150 MG tablet Take 150 mg by mouth at bedtime as needed for heartburn.    [provider]  Selenium 200 MCG CAPS Take 200 mcg by mouth daily.    [provider]    Family History Family History  Problem Relation Age of Onset  . Cancer Maternal Aunt        breast  . Cancer Paternal Aunt        Multiple Myeloma  . Stomach cancer Paternal Uncle   . Breast cancer Paternal Uncle   . Breast cancer Cousin        early 15s  . Breast cancer Paternal Aunt        late 53s-60s  . Stroke Mother   . Heart attack Father        Died age 45  . Heart attack Paternal Grandfather   . Breast cancer Paternal Aunt        bilateral breast cancer  . Dementia Paternal Aunt   . Congestive Heart Failure Paternal Uncle        Ischemic  . Congestive Heart Failure Paternal Uncle        Ischemic    Social History Social History  Substance Use Topics  . Smoking status: Former Smoker    Packs/day: 0.50    Years: 15.00    Types: Cigarettes    Quit date: 08/28/1993  . Smokeless tobacco: Never Used  . Alcohol use No     Allergies   Aspirin; Chocolate; Coffee bean extract; Ibuprofen; Oxycodone hcl; Penicillins; Shrimp [shellfish allergy]; Sulfonamide derivatives; and Augmentin [amoxicillin-pot clavulanate]   Review of Systems Review of Systems  Cardiovascular: Negative for chest pain.  Musculoskeletal: Positive for back pain.  Neurological: Positive for headaches.  All other systems reviewed and are negative.    Physical Exam Updated Vital Signs BP (!) 164/79 (BP  Location: Left Arm)   Pulse 74   Temp 98.4 F (36.9 C) (Oral)   Resp 20   SpO2 100%   Physical Exam  Constitutional: She appears well-developed and well-nourished.  HENT:  Head: Normocephalic.  Left Ear: External ear normal.  Nose: Nose normal.  Mouth/Throat: Oropharynx is clear and moist.  4cm swollen area occipital scalp  Eyes: Pupils are equal, round, and reactive to light. Conjunctivae are normal.  Neck:  Diffusely tender cervical spine, thoracic and lumbar spine.   Cardiovascular: Normal rate and regular rhythm.   Pulmonary/Chest: Effort normal and breath sounds normal.  Abdominal: Soft. She exhibits distension. There is no tenderness.  Musculoskeletal: She exhibits tenderness.  Neurological: She is alert.  Skin: Skin is warm.  Psychiatric: She has a normal mood and affect.  Vitals reviewed.    ED Treatments / Results  Labs (all labs ordered are listed, but only abnormal results are displayed) Labs Reviewed - No data to display  EKG  EKG Interpretation  Date/Time:  Monday November 07 2016 13:38:04 EDT Ventricular Rate:  74 PR Interval:  194 QRS Duration: 84 QT Interval:  420 QTC Calculation: 466 R Axis:   64 Text Interpretation:  Normal sinus rhythm Normal ECG Confirmed by Lajean Saver [phone]) on 11/07/2016 2:22:10 PM       Radiology No results found.  Procedures Procedures (including critical care time)  Medications Ordered in ED Medications  fentaNYL (SUBLIMAZE) injection 50 mcg (not administered)     Initial Impression / Assessment and Plan / ED Course  I have reviewed the triage vital signs and the nursing  notes.  Pertinent labs & imaging results that were available during my care of the patient were reviewed by me and considered in my medical decision making (see chart for details).     Dr. Ashok Cordia in to see and examine pt   Final Clinical Impressions(s) / ED Diagnoses   Final diagnoses:  None    New Prescriptions New Prescriptions     No medications on file     Sidney Ace 11/07/16 1540    Lajean Saver, MD 11/08/16 [phone]

## 2016-11-07 NOTE — ED Notes (Signed)
ED Provider at bedside. 

## 2016-11-07 NOTE — ED Provider Notes (Signed)
Handoff at shift change from [facility] and Dr. Ashok Cordia.   Patient status post fall. Pending CT of head and neck and plain film imagings of thoracic and lumbar spines.  Imaging does show age-indeterminate compression fracture at T11 level. This correlates with patient's exam. I examined her back and she does have tenderness in this area. No other step-offs or bruising noted.  6:53 PM Patient was treated with fentanyl and oral Vicodin with good relief of symptoms. She is ambulatory. She states that she has a walker and several canes at home that she will use to help her maintain stability.  Encouraged PCP follow-up/orthopedic follow-up in the next 1-2 weeks.  Patient counseled on use of narcotic pain medications. Counseled not to combine these medications with others containing tylenol. Urged not to drink alcohol, drive, or perform any other activities that requires focus while taking these medications. The patient verbalizes understanding and agrees with the plan.  Use pain medication only under direct supervision at the lowest possible dose needed to control your pain.   BP (!) 161/73   Pulse 63   Temp 98.4 F (36.9 C) (Oral)   Resp 16   SpO2 100%     Carlisle Cater, PA-C 11/07/16 1853    Lajean Saver, MD 11/08/16 [phone]

## 2016-11-07 NOTE — Discharge Instructions (Signed)
Please read and follow all provided instructions.  Your diagnoses today include:  1. Compression fracture of spine, initial encounter ([place])   2. Musculoskeletal pain     Tests performed today include:  CT scan of your head that did not show any serious injury.  CT scan of neck that did not show any fractures.  X-ray of your middle and lower back which demonstrates probably new compression fracture at T11.  Vital signs. See below for your results today.   Medications prescribed:   Vicodin (hydrocodone/acetaminophen) - narcotic pain medication  DO NOT drive or perform any activities that require you to be awake and alert because this medicine can make you drowsy. BE VERY CAREFUL not to take multiple medicines containing Tylenol (also called acetaminophen). Doing so can lead to an overdose which can damage your liver and cause liver failure and possibly death.  Take any prescribed medications only as directed.  Home care instructions:  Follow any educational materials contained in this packet.  BE VERY CAREFUL not to take multiple medicines containing Tylenol (also called acetaminophen). Doing so can lead to an overdose which can damage your liver and cause liver failure and possibly death.   Follow-up instructions: Please follow-up with your primary care provider in the next 7 days for further evaluation of your symptoms.   Return instructions:  SEEK IMMEDIATE MEDICAL ATTENTION IF:  There is confusion or drowsiness (although children frequently become drowsy after injury).   You cannot awaken the injured person.   You have more than one episode of vomiting.   You notice dizziness or unsteadiness which is getting worse, or inability to walk.   You have convulsions or unconsciousness.   You experience severe, persistent headaches not relieved by Tylenol.  You cannot use arms or legs normally.   There are changes in pupil sizes. (This is the black center in the colored  part of the eye)   There is clear or bloody discharge from the nose or ears.   You have change in speech, vision, swallowing, or understanding.   Localized weakness, numbness, tingling, or change in bowel or bladder control.  You have any other emergent concerns.  Additional Information: You have had a head injury which does not appear to require admission at this time.  Your vital signs today were: BP (!) 161/73    Pulse 63    Temp 98.4 F (36.9 C) (Oral)    Resp 16    SpO2 100%  If your blood pressure (BP) was elevated above 135/85 this visit, please have this repeated by your doctor within one month. --------------

## 2016-11-07 NOTE — ED Notes (Signed)
Patient transported to CT 

## 2016-11-18 ENCOUNTER — Other Ambulatory Visit: Payer: Self-pay | Admitting: Adult Health

## 2016-11-18 NOTE — Telephone Encounter (Signed)
Sent to the pharmacy by e-scribe for 90 days.  Pt has cpx scheduled with Cory on 11/29/16.

## 2016-11-23 ENCOUNTER — Other Ambulatory Visit: Payer: Self-pay | Admitting: Adult Health

## 2016-11-23 DIAGNOSIS — K219 Gastro-esophageal reflux disease without esophagitis: Secondary | ICD-10-CM

## 2016-11-24 ENCOUNTER — Telehealth: Payer: Self-pay | Admitting: Adult Health

## 2016-11-24 NOTE — Telephone Encounter (Signed)
Pt is calling to see if a antibiotic for nausea and diarrhea.  Pt state that her dogs has been Dx with cDiff and she has a fractured back and would like to just see if something can be called in.  Pt state that she is unable to come in due to her having a lot going on and a repairman is coming to her house, so she refuse an appointment state she will come in 09/28/16 and will discuss it at that time.  Pharm:  [facility].

## 2016-11-24 NOTE — Telephone Encounter (Signed)
I cannot prescribe anything without seeing her I am sorry. She would need to be tested for C. Diff.

## 2016-11-24 NOTE — Telephone Encounter (Signed)
Please keep 11/2016 appt.

## 2016-11-24 NOTE — Telephone Encounter (Signed)
Please advise 

## 2016-11-24 NOTE — Telephone Encounter (Signed)
I called and spoke to patient and advised her of Cory's message. She voiced understanding and states she will discuss this at there appt next week.

## 2016-11-29 ENCOUNTER — Encounter: Payer: Self-pay | Admitting: Adult Health

## 2016-11-29 ENCOUNTER — Ambulatory Visit (INDEPENDENT_AMBULATORY_CARE_PROVIDER_SITE_OTHER): Payer: Medicare Other | Admitting: Adult Health

## 2016-11-29 VITALS — BP 140/82 | Temp 98.6°F | Ht 63.5 in | Wt 138.0 lb

## 2016-11-29 DIAGNOSIS — Z780 Asymptomatic menopausal state: Secondary | ICD-10-CM | POA: Diagnosis not present

## 2016-11-29 DIAGNOSIS — I639 Cerebral infarction, unspecified: Secondary | ICD-10-CM | POA: Diagnosis not present

## 2016-11-29 DIAGNOSIS — I4891 Unspecified atrial fibrillation: Secondary | ICD-10-CM | POA: Diagnosis not present

## 2016-11-29 DIAGNOSIS — I10 Essential (primary) hypertension: Secondary | ICD-10-CM | POA: Diagnosis not present

## 2016-11-29 DIAGNOSIS — Z0000 Encounter for general adult medical examination without abnormal findings: Secondary | ICD-10-CM

## 2016-11-29 DIAGNOSIS — Z23 Encounter for immunization: Secondary | ICD-10-CM | POA: Diagnosis not present

## 2016-11-29 DIAGNOSIS — K219 Gastro-esophageal reflux disease without esophagitis: Secondary | ICD-10-CM | POA: Diagnosis not present

## 2016-11-29 DIAGNOSIS — J449 Chronic obstructive pulmonary disease, unspecified: Secondary | ICD-10-CM

## 2016-11-29 DIAGNOSIS — E785 Hyperlipidemia, unspecified: Secondary | ICD-10-CM

## 2016-11-29 LAB — CBC WITH DIFFERENTIAL/PLATELET
BASOS ABS: 0.1 10*3/uL (ref 0.0–0.1)
Basophils Relative: 1.1 % (ref 0.0–3.0)
EOS ABS: 0.1 10*3/uL (ref 0.0–0.7)
Eosinophils Relative: 1 % (ref 0.0–5.0)
HCT: 38.8 % (ref 36.0–46.0)
HEMOGLOBIN: 12.7 g/dL (ref 12.0–15.0)
LYMPHS PCT: 30 % (ref 12.0–46.0)
Lymphs Abs: 1.7 10*3/uL (ref 0.7–4.0)
MCHC: 32.8 g/dL (ref 30.0–36.0)
MCV: 89.9 fl (ref 78.0–100.0)
MONO ABS: 0.4 10*3/uL (ref 0.1–1.0)
Monocytes Relative: 7.3 % (ref 3.0–12.0)
Neutro Abs: 3.4 10*3/uL (ref 1.4–7.7)
Neutrophils Relative %: 60.6 % (ref 43.0–77.0)
Platelets: 228 10*3/uL (ref 150.0–400.0)
RBC: 4.32 Mil/uL (ref 3.87–5.11)
RDW: 13.2 % (ref 11.5–15.5)
WBC: 5.6 10*3/uL (ref 4.0–10.5)

## 2016-11-29 LAB — BASIC METABOLIC PANEL
BUN: 11 mg/dL (ref 6–23)
CALCIUM: 9.7 mg/dL (ref 8.4–10.5)
CO2: 29 mEq/L (ref 19–32)
CREATININE: 0.77 mg/dL (ref 0.40–1.20)
Chloride: 105 mEq/L (ref 96–112)
GFR: 77.88 mL/min (ref >60.00)
Glucose, Bld: 90 mg/dL (ref 70–99)
Potassium: 4 mEq/L (ref 3.5–5.1)
Sodium: 143 mEq/L (ref 135–145)

## 2016-11-29 LAB — HEPATIC FUNCTION PANEL
ALK PHOS: 80 U/L (ref 39–117)
ALT: 9 U/L (ref 0–35)
AST: 16 U/L (ref 0–37)
Albumin: 4.4 g/dL (ref 3.5–5.2)
BILIRUBIN DIRECT: 0.2 mg/dL (ref 0.0–0.3)
BILIRUBIN TOTAL: 1.5 mg/dL — AB (ref 0.2–1.2)
Total Protein: 6.7 g/dL (ref 6.0–8.3)

## 2016-11-29 LAB — TSH: TSH: 1.99 u[IU]/mL (ref 0.35–4.50)

## 2016-11-29 LAB — LIPID PANEL
CHOL/HDL RATIO: 3
Cholesterol: 179 mg/dL (ref 0–200)
HDL: 67.2 mg/dL (ref >39.00)
LDL CALC: 84 mg/dL (ref 0–99)
NONHDL: 112.08
TRIGLYCERIDES: 138 mg/dL (ref 0.0–149.0)
VLDL: 27.6 mg/dL (ref 0.0–40.0)

## 2016-11-29 LAB — VITAMIN D 25 HYDROXY (VIT D DEFICIENCY, FRACTURES): VITD: 54.31 ng/mL (ref 30.00–100.00)

## 2016-11-29 MED ORDER — AMLODIPINE BESYLATE 10 MG PO TABS: 10.0000 mg | ORAL_TABLET | Freq: Every day | ORAL | 1 refills | Status: DC

## 2016-11-29 NOTE — Progress Notes (Signed)
Subjective:    Patient ID: Alexandria [name], female    DOB: 03-30-1940, 76 y.o.   MRN: [id number]  HPI  Patient presents for yearly preventative medicine examination. 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   She takes Norvasc 5 mg,& HCTZ 12.5 mg for blood pressure control. She has been monitoring her blood pressure at home and has readings between 638-453'M systolic/80-90's.  She is on Lipitor for h/o hyperlipidemia   She takes Celexa 20 mg for control of anxiety and depression   She is on Plavix 75 mg for history of stroke and a fib. She was taken off Metoprolol last year of bradycardia. Since stopping metoprolol she feels as though she has more energy. She denies feeling as though she is ever in A fib. Today she has normal rate and rhythm   Her COPD is controlled with Advair and Ventolin   All immunizations and health maintenance protocols were reviewed with the patient and needed orders were placed. She is due for Prevnar 23   Appropriate screening laboratory values were ordered for the patient including screening of hyperlipidemia, renal function and hepatic function.  Medication reconciliation,  past medical history, social history, problem list and allergies were reviewed in  detail with the patient  Goals were established with regard to weight loss, exercise, and  diet in compliance with medications  She reports having cologuard done last year ( negative) she is seen by Dr. Tammi Klippel.  She had a mammogram this year. She participates in routine dental and vision care. She is due for her bone density screen   Interval History over the year includes  1. Left knee replacement  2. ER visit for fall off cart causing a head injury and compression fracture of T 11. She is following up with Dr. Veverly Fells for this. She has been prescribed Norco but reports that it makes her sick so she is not taking it any longer. She feels as though Tramadol works better for her.    Review of Systems  Constitutional: Negative.   HENT: Negative.   Eyes: Negative.   Respiratory: Negative for shortness of breath.   Cardiovascular: Negative.   Gastrointestinal: Negative.   Endocrine: Negative.   Genitourinary: Negative.   Musculoskeletal: Positive for arthralgias and back pain.  Skin: Negative.   Allergic/Immunologic: Negative.   Neurological: Negative.   Hematological: Negative.   Psychiatric/Behavioral: Negative.   All other systems reviewed and are negative.  Past Medical History:  Diagnosis Date  . Allergy   . Anemia    hx of   . Anxiety   . Arthritis    left knee, hands, back  . Asthma    daily and prn inhalers  . Brainstem infarct, acute ([place]) 03/2011   slight expressive aphasia, occ. problems with balance  . Breast cancer ([place]) 12/2011   right, ER+, PR -, Her 2 -  . COPD (chronic obstructive pulmonary disease) ([place])   . Degenerative joint disease of  low back   . Depression   . Dysrhythmia    atrial fib   . GERD (gastroesophageal reflux disease)   . Hearing loss    bilateral hearing aids  . 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 History of kidney stones   . Hx of radiation therapy 03/12/12 -04/13/12   right breast  .  Hyperlipidemia   . Hypertension    under control, has been on med. since age 15  . IBS (irritable bowel syndrome)   . Long term current use of aromatase inhibitor 05/08/2012  . Overactive bladder   . Palpitations   . Pneumonia    hx of x 2   . PVD (peripheral vascular disease) ([facility])    varicose veins - left worse than right  . Spinal stenosis   . Stress incontinence   . Stroke [facility])    problem with expressive communicaiton  . Traumatic hemopneumothorax 1982   left    Social History   Social History  . Marital status: Widowed    Spouse name: N/A  . Number of children: N/A  . Years of education: N/A   Occupational History  . Not on file.   Social History Main Topics  . Smoking status: Former Smoker    Packs/day: 0.50    Years: 15.00    Types: Cigarettes    Quit date: 08/28/1993  . Smokeless tobacco: Never Used  . Alcohol use No  . Drug use: No  . Sexual activity: Not Currently   Other Topics Concern  . Not on file   Social History Narrative   Widowed since 2014   Retired Scientist, product/process development    Son and daughter    Past Surgical History:  Procedure Laterality Date  . ANKLE HARDWARE REMOVAL     right  . BREAST BIOPSY  1976   right  . BREAST LUMPECTOMY  2013   right with snbx  . CHOLECYSTECTOMY  1990s  . CYSTOSCOPY  07/05/2005  . KNEE SURGERY  1980s   left  . ORIF ANKLE FRACTURE  1980s   right   . TOTAL KNEE ARTHROPLASTY Left 12/08/2015   Procedure: LEFT TOTAL KNEE ARTHROPLASTY;  Surgeon: Paralee Cancel, MD;  Location: [facility];  Service: Orthopedics;  Laterality: Left;  . TRANSOBTURATOR SLING  07/05/2005  . TUBAL LIGATION  1976  . VEIN SURGERY     vein ablation left leg    Family History  Problem Relation Age of Onset  . Cancer Maternal Aunt        breast  . Cancer Paternal Aunt        Multiple Myeloma  . Stomach cancer Paternal Uncle   . Breast cancer Paternal Uncle   . Breast cancer Cousin        early 71s  . Breast cancer Paternal Aunt        late  48s-60s  . Stroke Mother   . Heart attack Father        Died age 57  . Heart attack Paternal Grandfather   . Breast cancer Paternal Aunt        bilateral breast cancer  . Dementia Paternal Aunt   . Congestive Heart Failure Paternal Uncle        Ischemic  . Congestive Heart Failure Paternal Uncle        Ischemic    Allergies  Allergen Reactions  . Aspirin Hives  . Chocolate Hives and Other (See Comments)  RUNNY NOSE   . Coffee Bean Extract Hives and Other (See Comments)    RUNNY NOSE   . Ibuprofen Hives and Other (See Comments)    RUNNY NOSE  . Oxycodone Hcl Hives and Nausea And Vomiting  . Penicillins Hives  . Shrimp [Shellfish Allergy] Hives and Other (See Comments)    RUNNY NOSE   . Sulfonamide Derivatives Hives  . Augmentin [Amoxicillin-Pot Clavulanate] Itching and Rash    Current Outpatient Prescriptions on File Prior to Visit  Medication Sig Dispense Refill  . Acetaminophen (TYLENOL ARTHRITIS EXT RELIEF PO) Take 650 mg by mouth daily.    Marland Kitchen ADVAIR DISKUS 100-50 MCG/DOSE AEPB INHALE 1 PUFF TWICE DAILY AT 10 AM AND 5 PM. 60 each 10  . albuterol (VENTOLIN HFA) 108 (90 BASE) MCG/ACT inhaler INHALE 2 PUFFS INTO THE LUNGS EVERY 6 HOURS AS NEEDED FOR SHORTNESS OF BREATH AND WHEEZING. 18 g 3  . amLODipine (NORVASC) 5 MG tablet TAKE 1 TABLET DAILY. (Patient taking differently: TAKE 5MG BY MOUTH DAILY.) 90 tablet 0  . anastrozole (ARIMIDEX) 1 MG tablet TAKE 1 TABLET ONCE DAILY. (Patient taking differently: TAKE 1MG BY MOUTH ONCE DAILY.) 90 tablet 3  . atorvastatin (LIPITOR) 20 MG tablet TAKE (1) TABLET TWO TIMES A WEEK. (Patient taking differently: TAKE (1) TABLET TWO TIMES A WEEK. MONDAYS AND THURSDAYS.) 104 tablet 3  . b complex vitamins tablet Take 1 tablet by mouth daily. PLUS FOLIC ACID PLUS VIT. C    . Biotin 5000 MCG CAPS Take 5,000 mcg by mouth daily.     . Cholecalciferol (VITAMIN D-3) 5000 UNITS TABS Take 5,000 Units by mouth daily.     . clopidogrel (PLAVIX) 75 MG  tablet TAKE 1 TABLET DAILY. (Patient taking differently: TAKE 75MG BY MOUTH DAILY.) 90 tablet 3  . co-enzyme Q-10 50 MG capsule Take 200 mg by mouth daily.    . Cyanocobalamin (VITAMIN B-12 PO) Take 1 tablet by mouth 2 (two) times daily.    Marland Kitchen escitalopram (LEXAPRO) 10 MG tablet TAKE 1/2 TO 1 TABLET AT BEDTIME. 90 tablet 0  . fluticasone (FLONASE) 50 MCG/ACT nasal spray Place 2 sprays into both nostrils daily. (Patient taking differently: Place 2 sprays into both nostrils daily as needed for allergies or rhinitis. ) 16 g 11  . hydrochlorothiazide (MICROZIDE) 12.5 MG capsule TAKE 1 CAPSULE IN THE MORNING AS NEEDED. (Patient taking differently: TAKE 12.5MG BY MOUTH DAILY) 90 capsule 4  . loratadine (CLARITIN) 10 MG tablet Take 10 mg by mouth daily as needed for allergies.     . methocarbamol (ROBAXIN) 500 MG tablet Take 1 tablet (500 mg total) by mouth every 6 (six) hours as needed for muscle spasms. 60 tablet 0  . Misc Natural Products (OSTEO BI-FLEX ADV TRIPLE ST PO) Take 1 tablet by mouth daily.    . ondansetron (ZOFRAN) 4 MG tablet Take 4 mg by mouth every 8 (eight) hours as needed for nausea or vomiting.    . pantoprazole (PROTONIX) 40 MG tablet TAKE 1 TABLET DAILY. 90 tablet 0  . Polyethyl Glycol-Propyl Glycol (SYSTANE OP) Place 1 drop into both eyes daily as needed. Dry eyes    . potassium chloride SA (K-DUR,KLOR-CON) 20 MEQ tablet TAKE 3 TABLETS DAILY. (Patient taking differently: TAKE 60MG BY MOUTH DAILY.) 270 tablet 4  . Probiotic Product (ALIGN PO) Take 1 tablet by mouth daily.     . ranitidine (ZANTAC) 150 MG tablet Take 150 mg by mouth at bedtime as needed for heartburn.    Marland Kitchen  Selenium 200 MCG CAPS Take 200 mcg by mouth daily.     No current facility-administered medications on file prior to visit.     BP (!) 168/80 (BP Location: Left Arm)   Temp 98.6 F (37 C) (Oral)   Ht 5' 3.5" (1.613 m)   Wt 138 lb (62.6 kg)   BMI 24.06 kg/m       Objective:   Physical Exam    Constitutional: She is oriented to person, place, and time. She appears well-developed and well-nourished. No distress.  HENT:  Head: Normocephalic and atraumatic.  Right Ear: External ear normal.  Left Ear: External ear normal.  Nose: Nose normal.  Mouth/Throat: Oropharynx is clear and moist. No oropharyngeal exudate.  Eyes: Pupils are equal, round, and reactive to light. Conjunctivae are normal. Right eye exhibits no discharge. Left eye exhibits no discharge. No scleral icterus.  Neck: Normal range of motion. Neck supple. No JVD present. No tracheal deviation present. No thyromegaly present.  Cardiovascular: Normal rate, regular rhythm, normal heart sounds and intact distal pulses.  Exam reveals no gallop and no friction rub.   No murmur heard. Pulmonary/Chest: Effort normal and breath sounds normal. No stridor. No respiratory distress. She has no wheezes. She has no rales. She exhibits no tenderness.  Abdominal: Soft. Bowel sounds are normal. She exhibits no distension and no mass. There is no tenderness. There is no rebound and no guarding.  Genitourinary:  Genitourinary Comments: Deferred   Musculoskeletal: Normal range of motion. She exhibits tenderness (lumbar spine ). She exhibits no edema or deformity.  Walks with a single prong cain. She has steady gait   Lymphadenopathy:    She has no cervical adenopathy.  Neurological: She is alert and oriented to person, place, and time. She has normal reflexes. She displays normal reflexes. No cranial nerve deficit. She exhibits normal muscle tone. Coordination normal.  Skin: Skin is warm and dry. No rash noted. She is not diaphoretic. No erythema. No pallor.  varicose veins   Psychiatric: She has a normal mood and affect. Her behavior is normal. Judgment and thought content normal.  Nursing note and vitals reviewed.     Assessment & Plan:  1. Routine general medical examination at a health care facility - Prevnar and flu vaccination given  today  - Follow up in one year or sooner if needed - Continue to stay active and eat a heart healthy diet.  - Basic metabolic panel - CBC with Differential/Platelet - Hepatic function panel - Lipid panel - TSH  2. Essential hypertension, benign - Will increase norvasc to 10 mg daily. Continue to monitor at home.  - Return precautions given  - Basic metabolic panel - CBC with Differential/Platelet - Hepatic function panel - Lipid panel - TSH - amLODipine (NORVASC) 10 MG tablet; Take 1 tablet (10 mg total) by mouth daily.  Dispense: 90 tablet; Refill: 1  3. Cerebrovascular accident (CVA), unspecified mechanism ([place]) - Continue with Plavix   4. Atrial fibrillation, unspecified type ([place]) - Continue with Plavix.  - she has normal rate and rhythm. I do not feel as though she needs to go back on Metoprolol at this time. Follow up if needed  5. Chronic obstructive pulmonary disease, unspecified COPD type ([place]) - Stable with current inhalers   6. Gastroesophageal reflux disease, esophagitis presence not specified - Continue with Protonix daily  and Zantac PRN   7. Hyperlipidemia, unspecified hyperlipidemia type - Consider increasing lipitor  - Basic metabolic panel - CBC  with Differential/Platelet - Hepatic function panel - Lipid panel - TSH  8. Post-menopausal  - DG Bone Density; Future - Vitamin D, 25-hydroxy  [name], NP

## 2016-11-29 NOTE — Patient Instructions (Addendum)
It was great seeing you today !  I will follow up with you regarding your labs   Please follow up with Dr. Veverly Fells today about pain medication   Someone will call you to schedule your bone density   I have inc

## 2016-11-30 ENCOUNTER — Telehealth: Payer: Self-pay | Admitting: Adult Health

## 2016-11-30 NOTE — Telephone Encounter (Signed)
Error/njr °

## 2016-12-05 ENCOUNTER — Other Ambulatory Visit: Payer: Self-pay | Admitting: Hematology and Oncology

## 2016-12-05 DIAGNOSIS — Z853 Personal history of malignant neoplasm of breast: Secondary | ICD-10-CM

## 2016-12-11 ENCOUNTER — Other Ambulatory Visit: Payer: Self-pay | Admitting: Hematology and Oncology

## 2016-12-11 DIAGNOSIS — C50211 Malignant neoplasm of upper-inner quadrant of right female breast: Secondary | ICD-10-CM

## 2016-12-12 ENCOUNTER — Other Ambulatory Visit: Payer: Self-pay | Admitting: General Surgery

## 2016-12-12 DIAGNOSIS — N6452 Nipple discharge: Secondary | ICD-10-CM

## 2016-12-23 ENCOUNTER — Ambulatory Visit (INDEPENDENT_AMBULATORY_CARE_PROVIDER_SITE_OTHER)
Admission: RE | Admit: 2016-12-23 | Discharge: 2016-12-23 | Disposition: A | Discharge status: Home / Self Care | Payer: Medicare Other | Source: Ambulatory Visit | Attending: Adult Health | Admitting: Adult Health

## 2016-12-23 DIAGNOSIS — Z780 Asymptomatic menopausal state: Secondary | ICD-10-CM | POA: Diagnosis not present

## 2017-01-11 ENCOUNTER — Ambulatory Visit
Admission: RE | Admit: 2017-01-11 | Discharge: 2017-01-11 | Disposition: A | Discharge status: Home / Self Care | Payer: Medicare Other | Source: Ambulatory Visit | Attending: General Surgery | Admitting: General Surgery

## 2017-01-11 DIAGNOSIS — N6452 Nipple discharge: Secondary | ICD-10-CM

## 2017-03-14 DIAGNOSIS — C439 Malignant melanoma of skin, unspecified: Secondary | ICD-10-CM

## 2017-03-14 DIAGNOSIS — C4491 Basal cell carcinoma of skin, unspecified: Secondary | ICD-10-CM

## 2017-03-14 HISTORY — DX: Malignant melanoma of skin, unspecified: C43.9

## 2017-03-14 HISTORY — DX: Basal cell carcinoma of skin, unspecified: C44.91

## 2017-03-14 HISTORY — PX: BREAST BIOPSY: SHX20

## 2017-03-16 ENCOUNTER — Other Ambulatory Visit: Payer: Self-pay | Admitting: Adult Health

## 2017-03-16 DIAGNOSIS — K219 Gastro-esophageal reflux disease without esophagitis: Secondary | ICD-10-CM

## 2017-03-16 NOTE — Telephone Encounter (Signed)
Sent to the pharmacy by e-scribe. 

## 2017-03-21 ENCOUNTER — Other Ambulatory Visit: Payer: Self-pay | Admitting: Hematology and Oncology

## 2017-03-21 DIAGNOSIS — C50211 Malignant neoplasm of upper-inner quadrant of right female breast: Secondary | ICD-10-CM

## 2017-04-11 NOTE — Assessment & Plan Note (Deleted)
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 She is in significantly less pain than before. Tendinitis and trigger finger: I instructed the patient to hold anastrozole for 2 weeks since to see if it response. If it does get better then we may switch her to letrozole. She will call us and discuss this with [place] on the phone.  Breast Cancer Surveillance: 1. Breast exam 04/12/17: Normal 2. Mammogram 01/11/2017: Benign  Return to clinic in 1 year for follow-up

## 2017-04-12 ENCOUNTER — Telehealth: Payer: Self-pay | Admitting: Hematology and Oncology

## 2017-04-12 ENCOUNTER — Ambulatory Visit: Payer: Self-pay | Admitting: Hematology and Oncology

## 2017-04-12 NOTE — Telephone Encounter (Signed)
Returned patients call regarding 1/30 appointment. Patient had transportation issues and r/s to 2/4

## 2017-04-17 ENCOUNTER — Inpatient Hospital Stay: Payer: Medicare Other | Attending: Hematology and Oncology | Admitting: Hematology and Oncology

## 2017-04-17 DIAGNOSIS — Z9223 Personal history of estrogen therapy: Secondary | ICD-10-CM | POA: Diagnosis not present

## 2017-04-17 DIAGNOSIS — Z79899 Other long term (current) drug therapy: Secondary | ICD-10-CM | POA: Diagnosis not present

## 2017-04-17 DIAGNOSIS — Z923 Personal history of irradiation: Secondary | ICD-10-CM

## 2017-04-17 DIAGNOSIS — M810 Age-related osteoporosis without current pathological fracture: Secondary | ICD-10-CM

## 2017-04-17 DIAGNOSIS — G8929 Other chronic pain: Secondary | ICD-10-CM | POA: Diagnosis not present

## 2017-04-17 DIAGNOSIS — Z170 Estrogen receptor positive status [ER+]: Secondary | ICD-10-CM | POA: Diagnosis not present

## 2017-04-17 DIAGNOSIS — Z853 Personal history of malignant neoplasm of breast: Secondary | ICD-10-CM | POA: Diagnosis present

## 2017-04-17 DIAGNOSIS — C50211 Malignant neoplasm of upper-inner quadrant of right female breast: Secondary | ICD-10-CM

## 2017-04-17 MED ORDER — HYDROCODONE-ACETAMINOPHEN 5-325 MG PO TABS: 1.0000 | ORAL_TABLET | Freq: Four times a day (QID) | ORAL | 0 refills | Status: DC | PRN

## 2017-04-17 MED ORDER — TRAMADOL HCL 50 MG PO TABS: 50.0000 mg | ORAL_TABLET | Freq: Four times a day (QID) | ORAL | Status: DC | PRN

## 2017-04-17 NOTE — Assessment & Plan Note (Signed)
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 She is in significantly less pain than before. Tendinitis and trigger finger: I instructed the patient to hold anastrozole for 2 weeks since to see if it response. If it does get better then we may switch her to letrozole. She will call us and discuss this with [place] on the phone.  Breast Cancer Surveillance: 1. Breast exam 04/17/2017: Normal 2. Mammogram and ultrasound 01/11/2017: Benignbreast density category C  Return to clinic in 1 year for follow-up

## 2017-04-17 NOTE — Progress Notes (Signed)
Patient Care Team: [name], NP as PCP - General (Family Medicine)  DIAGNOSIS:  Encounter Diagnosis  Name Primary?  . Malignant neoplasm of upper-inner quadrant of right breast in female, estrogen receptor positive ([place])     SUMMARY OF ONCOLOGIC HISTORY:   Breast cancer of upper-inner quadrant of right female breast ([place])   01/04/2012 Surgery    Right breast lumpectomy and a 1.6 cm IDC with negative margins, 1 SLN negative grade 1 with low-grade DCIS, ER 100%, PR 0%, HER-2 negative ratio 1.14, Ki-67 27% T1 C. N0 M0 stage IA Oncotype DX score 9 7%ROR      02/28/2012 - 04/04/2012 Radiation Therapy    Adjuvant radiation therapy      05/08/2012 - 04/17/2017 Anti-estrogen oral therapy    Arimidex 1 mg daily  5 years is the treatment plan       CHIEF COMPLIANT: Follow-up on anastrozole, multiple medical problems  INTERVAL HISTORY: Alexandria [name] is a 77 year old with above-mentioned history of right breast cancer treated with lumpectomy radiation and has completed 5 years of antiestrogen therapy with Arimidex.  She has had a osteopenia and osteoporosis and is currently taking bisphosphonate therapy orally.  She tells me that he has had multiple problems with osteoporotic compression fractures of her spine, ribs etc.  She believes that her bones significantly gotten weaker primarily because of antiestrogen therapy.  REVIEW OF SYSTEMS:   Constitutional: Denies fevers, chills or abnormal weight loss Eyes: Denies blurriness of vision Ears, nose, mouth, throat, and face: Denies mucositis or sore throat Respiratory: Denies cough, dyspnea or wheezes Cardiovascular: Denies palpitation, chest discomfort Gastrointestinal:  Denies nausea, heartburn or change in bowel habits Skin: Denies abnormal skin rashes Lymphatics: Denies new lymphadenopathy or easy bruising Neurological:Denies numbness, tingling or new weaknesses Behavioral/Psych: Mood is stable, no new changes  Extremities: No  lower extremity edema Breast:  denies any pain or lumps or nodules in either breasts All other systems were reviewed with the patient and are negative.  I have reviewed the past medical history, past surgical history, social history and family history with the patient and they are unchanged from previous note.  ALLERGIES:  is allergic to aspirin; chocolate; coffee bean extract; ibuprofen; oxycodone hcl; penicillins; shrimp [shellfish allergy]; sulfonamide derivatives; and augmentin [amoxicillin-pot clavulanate].  MEDICATIONS:  Current Outpatient Medications  Medication Sig Dispense Refill  . Acetaminophen (TYLENOL ARTHRITIS EXT RELIEF PO) Take 650 mg by mouth daily.    Marland Kitchen ADVAIR DISKUS 100-50 MCG/DOSE AEPB INHALE 1 PUFF TWICE DAILY AT 10 AM AND 5 PM. 60 each 10  . albuterol (VENTOLIN HFA) 108 (90 BASE) MCG/ACT inhaler INHALE 2 PUFFS INTO THE LUNGS EVERY 6 HOURS AS NEEDED FOR SHORTNESS OF BREATH AND WHEEZING. 18 g 3  . amLODipine (NORVASC) 10 MG tablet Take 1 tablet (10 mg total) by mouth daily. 90 tablet 1  . atorvastatin (LIPITOR) 20 MG tablet TAKE (1) TABLET TWO TIMES A WEEK. (Patient taking differently: TAKE (1) TABLET TWO TIMES A WEEK. MONDAYS AND THURSDAYS.) 104 tablet 3  . b complex vitamins tablet Take 1 tablet by mouth daily. PLUS FOLIC ACID PLUS VIT. C    . Biotin 5000 MCG CAPS Take 5,000 mcg by mouth daily.     . Cholecalciferol (VITAMIN D-3) 5000 UNITS TABS Take 5,000 Units by mouth daily.     . clopidogrel (PLAVIX) 75 MG tablet TAKE 1 TABLET DAILY. (Patient taking differently: TAKE '75MG'$  BY MOUTH DAILY.) 90 tablet 3  . co-enzyme Q-10 50 MG  capsule Take 200 mg by mouth daily.    . Cyanocobalamin (VITAMIN B-12 PO) Take 1 tablet by mouth 2 (two) times daily.    Marland Kitchen escitalopram (LEXAPRO) 10 MG tablet TAKE 1/2 TO 1 TABLET AT BEDTIME. 90 tablet 0  . fluticasone (FLONASE) 50 MCG/ACT nasal spray Place 2 sprays into both nostrils daily. (Patient taking differently: Place 2 sprays into both  nostrils daily as needed for allergies or rhinitis. ) 16 g 11  . hydrochlorothiazide (MICROZIDE) 12.5 MG capsule TAKE 1 CAPSULE IN THE MORNING AS NEEDED. (Patient taking differently: TAKE 12.'5MG'$  BY MOUTH DAILY) 90 capsule 4  . HYDROcodone-acetaminophen (NORCO/VICODIN) 5-325 MG tablet Take 1 tablet by mouth every 6 (six) hours as needed for moderate pain. 30 tablet 0  . loratadine (CLARITIN) 10 MG tablet Take 10 mg by mouth daily as needed for allergies.     . methocarbamol (ROBAXIN) 500 MG tablet Take 1 tablet (500 mg total) by mouth every 6 (six) hours as needed for muscle spasms. 60 tablet 0  . Misc Natural Products (OSTEO BI-FLEX ADV TRIPLE ST PO) Take 1 tablet by mouth daily.    . ondansetron (ZOFRAN) 4 MG tablet Take 4 mg by mouth every 8 (eight) hours as needed for nausea or vomiting.    . pantoprazole (PROTONIX) 40 MG tablet TAKE 1 TABLET BY MOUTH DAILY. 90 tablet 1  . Polyethyl Glycol-Propyl Glycol (SYSTANE OP) Place 1 drop into both eyes daily as needed. Dry eyes    . potassium chloride SA (K-DUR,KLOR-CON) 20 MEQ tablet TAKE 3 TABLETS DAILY. (Patient taking differently: TAKE '60MG'$  BY MOUTH DAILY.) 270 tablet 4  . Probiotic Product (ALIGN PO) Take 1 tablet by mouth daily.     . ranitidine (ZANTAC) 150 MG tablet Take 150 mg by mouth at bedtime as needed for heartburn.    . Selenium 200 MCG CAPS Take 200 mcg by mouth daily.    . traMADol (ULTRAM) 50 MG tablet Take 1 tablet (50 mg total) by mouth every 6 (six) hours as needed. 30 tablet    No current facility-administered medications for this visit.     PHYSICAL EXAMINATION: ECOG PERFORMANCE STATUS: 1 - Symptomatic but completely ambulatory  Vitals:   04/17/17 1137  BP: 108/69  Pulse: 73  Resp: 17  Temp: 98.7 F (37.1 C)  SpO2: 100%   Filed Weights   04/17/17 1137  Weight: 141 lb 3.2 oz (64 kg)    GENERAL:alert, no distress and comfortable SKIN: skin color, texture, turgor are normal, no rashes or significant lesions EYES:  normal, Conjunctiva are pink and non-injected, sclera clear OROPHARYNX:no exudate, no erythema and lips, buccal mucosa, and tongue normal  NECK: supple, thyroid normal size, non-tender, without nodularity LYMPH:  no palpable lymphadenopathy in the cervical, axillary or inguinal LUNGS: clear to auscultation and percussion with normal breathing effort HEART: regular rate & rhythm and no murmurs and no lower extremity edema ABDOMEN:abdomen soft, non-tender and normal bowel sounds MUSCULOSKELETAL:no cyanosis of digits and no clubbing  NEURO: alert & oriented x 3 with fluent speech, no focal motor/sensory deficits EXTREMITIES: No lower extremity edema BREAST: No palpable masses or nodules in either right or left breasts. No palpable axillary supraclavicular or infraclavicular adenopathy no breast tenderness or nipple discharge. (exam performed in the presence of a chaperone)  LABORATORY DATA:  I have reviewed the data as listed CMP Latest Ref Rng & Units 11/29/2016 12/09/2015 11/19/2015  Glucose 70 - 99 mg/dL 90 137(H) 84  BUN 6 - 23  mg/dL '11 16 16  '$ Creatinine 0.40 - 1.20 mg/dL 0.77 0.83 0.84  Sodium 135 - 145 mEq/L 143 141 143  Potassium 3.5 - 5.1 mEq/L 4.0 3.5 4.1  Chloride 96 - 112 mEq/L 105 107 103  CO2 19 - 32 mEq/L 29 26 34(H)  Calcium 8.4 - 10.5 mg/dL 9.7 8.9 9.5  Total Protein 6.0 - 8.3 g/dL 6.7 - 6.9  Total Bilirubin 0.2 - 1.2 mg/dL 1.5(H) - 1.5(H)  Alkaline Phos 39 - 117 U/L 80 - 63  AST 0 - 37 U/L 16 - 17  ALT 0 - 35 U/L 9 - 10    Lab Results  Component Value Date   WBC 5.6 11/29/2016   HGB 12.7 11/29/2016   HCT 38.8 11/29/2016   MCV 89.9 11/29/2016   PLT 228.0 11/29/2016   NEUTROABS 3.4 11/29/2016    ASSESSMENT & PLAN:  Breast cancer of upper-inner quadrant of right female breast Right breast invasive ductal carcinoma that is postlumpectomy 1 SLN -1.6 cm ER positive, PR negative, HER-2 negative, Oncotype DX recurrence score 9, 7% risk of recurrence, status post radiation  and currently on Arimidex and started 05/08/2012  Arimidex related toxicities: Osteopenia: Recent bone density April 2016 showed T score -1.2 osteopenia mild: Patient takes calcium with vitamin D Occasional hot flashes  Right chest wall pain: Multiple chr rib fractures (CXR July 2016) Chronic back pain: Due to osteoporotic compression fractures  Breast Cancer Surveillance: 1. Breast exam 04/17/2017: Normal 2. Mammogram and ultrasound 01/11/2017: Benignbreast density category C  Since the patient completed 5 years of antiestrogen therapy, she can be seen on an as-needed basis.  Patient does admit that she sees so many doctors that she would like to see Dr. Donne Hazel on an annual basis and see [place] on an as-needed basis.   I spent 25 minutes talking to the patient of which more than half was spent in counseling and coordination of care.  No orders of the defined types were placed in this encounter.  The patient has a good understanding of the overall plan. she agrees with it. she will call with any problems that may develop before the next visit here.   Harriette Ohara, MD 04/17/17

## 2017-04-30 ENCOUNTER — Other Ambulatory Visit: Payer: Self-pay | Admitting: Adult Health

## 2017-04-30 DIAGNOSIS — I639 Cerebral infarction, unspecified: Secondary | ICD-10-CM

## 2017-05-02 NOTE — Telephone Encounter (Signed)
Sent to the pharmacy by e-scribe. 

## 2017-05-17 ENCOUNTER — Encounter: Payer: Self-pay | Admitting: Adult Health

## 2017-05-17 ENCOUNTER — Ambulatory Visit (INDEPENDENT_AMBULATORY_CARE_PROVIDER_SITE_OTHER): Payer: Medicare Other | Admitting: Adult Health

## 2017-05-17 VITALS — BP 140/86 | Temp 98.3°F | Wt 142.0 lb

## 2017-05-17 DIAGNOSIS — Z01818 Encounter for other preprocedural examination: Secondary | ICD-10-CM | POA: Diagnosis not present

## 2017-05-17 LAB — CBC WITH DIFFERENTIAL/PLATELET
Basophils Absolute: 0.1 10*3/uL (ref 0.0–0.1)
Basophils Relative: 1.4 % (ref 0.0–3.0)
Eosinophils Absolute: 0.1 10*3/uL (ref 0.0–0.7)
Eosinophils Relative: 0.8 % (ref 0.0–5.0)
HCT: 38.6 % (ref 36.0–46.0)
Hemoglobin: 12.8 g/dL (ref 12.0–15.0)
LYMPHS ABS: 2.3 10*3/uL (ref 0.7–4.0)
Lymphocytes Relative: 34.8 % (ref 12.0–46.0)
MCHC: 33.2 g/dL (ref 30.0–36.0)
MCV: 88.6 fl (ref 78.0–100.0)
MONO ABS: 0.4 10*3/uL (ref 0.1–1.0)
MONOS PCT: 6.8 % (ref 3.0–12.0)
NEUTROS ABS: 3.6 10*3/uL (ref 1.4–7.7)
NEUTROS PCT: 56.2 % (ref 43.0–77.0)
Platelets: 231 10*3/uL (ref 150.0–400.0)
RBC: 4.36 Mil/uL (ref 3.87–5.11)
RDW: 14.2 % (ref 11.5–15.5)
WBC: 6.5 10*3/uL (ref 4.0–10.5)

## 2017-05-17 LAB — BASIC METABOLIC PANEL
BUN: 16 mg/dL (ref 6–23)
CO2: 30 meq/L (ref 19–32)
CREATININE: 0.84 mg/dL (ref 0.40–1.20)
Calcium: 9.9 mg/dL (ref 8.4–10.5)
Chloride: 102 mEq/L (ref 96–112)
GFR: 70.35 mL/min (ref >60.00)
GLUCOSE: 80 mg/dL (ref 70–99)
Potassium: 4.3 mEq/L (ref 3.5–5.1)
Sodium: 140 mEq/L (ref 135–145)

## 2017-05-17 NOTE — Progress Notes (Signed)
Subjective:    Patient ID: Alexandria [name], female    DOB: [birth year], 77 y.o.   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  She presents to the office today for surgical clearance. She will be having T11 Kyphoplasty. [place] will be the performing the surgery.   BP Readings from Last 3 Encounters:  05/17/17 (!) 162/80  04/17/17 108/69  11/29/16 140/82   Review of Systems See HPI   Past Medical History:  Diagnosis Date  . Allergy   . Anemia    hx of   . Anxiety   . Arthritis    left knee, hands, back  . Asthma    daily and prn inhalers  . 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 problems with balance  . Breast cancer ([facility]) 12/2011   right, ER+, PR -, Her 2 -  . COPD (chronic obstructive pulmonary disease) ([facility])   . Degenerative joint disease of low back   . Depression   . Dysrhythmia    atrial fib   . GERD (gastroesophageal reflux disease)   . Hearing loss    bilateral hearing aids  . 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 History of kidney stones   . Hx of radiation therapy 03/12/12 -04/13/12   right breast  . Hyperlipidemia   . Hypertension    under control, has been on med. since age 24   . IBS (irritable bowel syndrome)   . Long term current use of aromatase inhibitor 05/08/2012  . Overactive bladder   . Palpitations   . Pneumonia    hx of x 2   . PVD (peripheral vascular disease) ([facility])    varicose veins - left worse than right  . Spinal stenosis   . Stress incontinence   . Stroke [facility])    problem with expressive communicaiton  . Traumatic hemopneumothorax 1982   left    Social History   Socioeconomic History  . Marital status: Widowed    Spouse name: Not on file  . Number of children: Not on file  . Years of education: Not on file  . Highest education level: Not on file  Social Needs  . Financial resource strain: Not on file  . Food insecurity - worry: Not on file  . Food insecurity - inability: Not on file  . Transportation needs - medical: Not on file  . Transportation needs - non-medical: Not on file  Occupational History  . Not on file  Tobacco Use  . Smoking status: Former Smoker    Packs/day: 0.50    Years: 15.00    Pack years: 7.50    Types: Cigarettes    Last attempt to quit: 08/28/1993    Years since quitting: 23.7  . Smokeless tobacco: Never Used  Substance and Sexual Activity  . Alcohol use: No    Alcohol/week: 0.0 oz  . Drug use: No  .  Sexual activity: Not Currently  Other Topics Concern  . 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 ANKLE HARDWARE REMOVAL     right  . BREAST BIOPSY  1976   right  . BREAST LUMPECTOMY  2013   right with snbx  . CHOLECYSTECTOMY  1990s  . CYSTOSCOPY  07/05/2005  . KNEE SURGERY  1980s   left  . ORIF ANKLE FRACTURE  1980s   right   . TOTAL KNEE ARTHROPLASTY Left 12/08/2015   Procedure: LEFT TOTAL KNEE ARTHROPLASTY;  Surgeon: Paralee Cancel, MD;  Location: [facility];  Service: Orthopedics;  Laterality: Left;  . TRANSOBTURATOR SLING  07/05/2005  . TUBAL LIGATION  1976  . VEIN SURGERY       vein ablation left leg    Family History  Problem Relation Age of Onset  . Cancer Maternal Aunt        breast  . Cancer Paternal Aunt        Multiple Myeloma  . Stomach cancer Paternal Uncle   . Breast cancer Paternal Uncle   . Breast cancer Cousin        early 43s  . Breast cancer Paternal Aunt        late 40s-60s  . Stroke Mother   . Heart attack Father        Died age 25  . Heart attack Paternal Grandfather   . Breast cancer Paternal Aunt        bilateral breast cancer  . Dementia Paternal Aunt   . Congestive Heart Failure Paternal Uncle        Ischemic  . Congestive Heart Failure Paternal Uncle        Ischemic    Allergies  Allergen Reactions  . Aspirin Hives  . Chocolate Hives and Other (See Comments)    RUNNY NOSE   . Coffee Bean Extract Hives and Other (See Comments)    RUNNY NOSE   . Ibuprofen Hives and Other (See Comments)    RUNNY NOSE  . Oxycodone Hcl Hives and Nausea And Vomiting  . Penicillins Hives  . Shrimp [Shellfish Allergy] Hives and Other (See Comments)    RUNNY NOSE   . Sulfonamide Derivatives Hives  . Augmentin [Amoxicillin-Pot Clavulanate] Itching and Rash    Current Outpatient Medications on File Prior to Visit  Medication Sig Dispense Refill  . Acetaminophen (TYLENOL ARTHRITIS EXT RELIEF PO) Take 650 mg by mouth daily.    Marland Kitchen ADVAIR DISKUS 100-50 MCG/DOSE AEPB INHALE 1 PUFF TWICE DAILY AT 10 AM AND 5 PM. 60 each 10  . albuterol (VENTOLIN HFA) 108 (90 BASE) MCG/ACT inhaler INHALE 2 PUFFS INTO THE LUNGS EVERY 6 HOURS AS NEEDED FOR SHORTNESS OF BREATH AND WHEEZING. 18 g 3  . amLODipine (NORVASC) 10 MG tablet Take 1 tablet (10 mg total) by mouth daily. 90 tablet 1  . b complex vitamins tablet Take 1 tablet by mouth daily. PLUS FOLIC ACID PLUS VIT. C    . Biotin 5000 MCG CAPS Take 5,000 mcg by mouth daily.     . Cholecalciferol (VITAMIN D-3) 5000 UNITS TABS Take 5,000 Units by mouth daily.     . clopidogrel (PLAVIX) 75 MG tablet TAKE 1  TABLET BY MOUTH DAILY. 90 tablet 1  . co-enzyme Q-10 50 MG capsule Take 200 mg by mouth daily.    Marland Kitchen  Cyanocobalamin (VITAMIN B-12 PO) Take 1 tablet by mouth 2 (two) times daily.    Marland Kitchen escitalopram (LEXAPRO) 10 MG tablet TAKE 1/2 TO 1 TABLET AT BEDTIME. 90 tablet 0  . fluticasone (FLONASE) 50 MCG/ACT nasal spray Place 2 sprays into both nostrils daily. (Patient taking differently: Place 2 sprays into both nostrils daily as needed for allergies or rhinitis. ) 16 g 11  . hydrochlorothiazide (MICROZIDE) 12.5 MG capsule TAKE 1 CAPSULE IN THE MORNING AS NEEDED. (Patient taking differently: TAKE 12.'5MG'$  BY MOUTH DAILY) 90 capsule 4  . HYDROcodone-acetaminophen (NORCO/VICODIN) 5-325 MG tablet Take 1 tablet by mouth every 6 (six) hours as needed for moderate pain. 30 tablet 0  . loratadine (CLARITIN) 10 MG tablet Take 10 mg by mouth daily as needed for allergies.     . methocarbamol (ROBAXIN) 500 MG tablet Take 1 tablet (500 mg total) by mouth every 6 (six) hours as needed for muscle spasms. 60 tablet 0  . Misc Natural Products (OSTEO BI-FLEX ADV TRIPLE ST PO) Take 1 tablet by mouth daily.    . ondansetron (ZOFRAN) 4 MG tablet Take 4 mg by mouth every 8 (eight) hours as needed for nausea or vomiting.    . pantoprazole (PROTONIX) 40 MG tablet TAKE 1 TABLET BY MOUTH DAILY. 90 tablet 1  . Polyethyl Glycol-Propyl Glycol (SYSTANE OP) Place 1 drop into both eyes daily as needed. Dry eyes    . potassium chloride SA (K-DUR,KLOR-CON) 20 MEQ tablet TAKE 3 TABLETS DAILY. (Patient taking differently: TAKE '60MG'$  BY MOUTH DAILY.) 270 tablet 4  . Probiotic Product (ALIGN PO) Take 1 tablet by mouth daily.     . ranitidine (ZANTAC) 150 MG tablet Take 150 mg by mouth at bedtime as needed for heartburn.    . Selenium 200 MCG CAPS Take 200 mcg by mouth daily.    . traMADol (ULTRAM) 50 MG tablet Take 1 tablet (50 mg total) by mouth every 6 (six) hours as needed. 30 tablet   . atorvastatin (LIPITOR) 20 MG tablet TAKE (1) TABLET TWO  TIMES A WEEK. (Patient not taking: Reported on 05/17/2017) 104 tablet 3   No current facility-administered medications on file prior to visit.     BP (!) 162/80   Temp 98.3 F (36.8 C) (Oral)   Wt 142 lb (64.4 kg)   BMI 24.76 kg/m       Objective:   Physical Exam  Constitutional: She is oriented to person, place, and time. She appears well-developed and well-nourished. No distress.  Cardiovascular: Normal rate, regular rhythm and intact distal pulses. Exam reveals no gallop and no friction rub.  No murmur heard. Pulmonary/Chest: Effort normal and breath sounds normal. No respiratory distress. She has no wheezes. She has no rales. She exhibits no tenderness.  Musculoskeletal:  Walks with slow steady gait. Is able to get onto exam table with minimal assist.   Neurological: She is alert and oriented to person, place, and time.  Skin: Skin is warm and dry. No rash noted. She is not diaphoretic. No erythema. No pallor.  Psychiatric: She has a normal mood and affect. Her behavior is normal. Judgment and thought content normal.  Nursing note and vitals reviewed.     Assessment & Plan:  1. Pre-operative clearance - CBC with Differential/Platelet - Basic Metabolic Panel - EKG 44-YJEH- NSR, Rate 65.  - Will fax surgical clearance to [place], NP

## 2017-06-08 NOTE — Pre-Procedure Instructions (Signed)
TISHA CLINE  06/08/2017      [place], [place] [address] Phone: [phone] Fax: [phone]    Your procedure is scheduled on April 4  Report to [facility] at [place].M.  Call this number if you have problems the morning of surgery:  [phone]   Remember:  Do not eat food or drink liquids after midnight.  Continue all medications as directed by your physician except follow these medication instructions before surgery below   Take these medicines the morning of surgery with A SIP OF WATER  acetaminophen (TYLENOL)  ADVAIR DISKUS albuterol (VENTOLIN HFA)  amLODipine (NORVASC fluticasone (FLONASE) HYDROcodone-acetaminophen (NORCO/VICODIN)  loratadine (CLARITIN) methocarbamol (ROBAXIN) pantoprazole (PROTONIX)  ranitidine (ZANTAC)  7 days prior to surgery STOP taking any Aspirin(unless otherwise instructed by your surgeon), Aleve, Naproxen, Ibuprofen, Motrin, Advil, Goody's, BC's, all herbal medications, fish oil, and all vitamins  FOLLOW PHYSICIANS INSTRUCTIONS ABOUT PLAVIX   Do not wear jewelry, make-up or nail polish.  Do not wear lotions, powders, or perfumes, or deodorant.  Do not shave 48 hours prior to surgery.   Do not bring valuables to the hospital.  [facility] is not responsible for any belongings or valuables.  Contacts, dentures or bridgework may not be worn into surgery.  Leave your suitcase in the car.  After surgery it may be brought to your room.  For patients admitted to the hospital, discharge time will be determined by your treatment team.  Patients discharged the day of surgery will not be allowed to drive home.    Special instructions:   [facility]- Preparing For Surgery  Before surgery, you can play an important role. Because skin is not sterile, your skin needs to be as free of germs as possible. You can reduce the number of  germs on your skin by washing with CHG (chlorahexidine gluconate) Soap before surgery.  CHG is an antiseptic cleaner which kills germs and bonds with the skin to continue killing germs even after washing.  Please do not use if you have an allergy to CHG or antibacterial soaps. If your skin becomes reddened/irritated stop using the CHG.  Do not shave (including legs and underarms) for at least 48 hours prior to first CHG shower. It is OK to shave your face.  Please follow these instructions carefully.   1. Shower the NIGHT BEFORE SURGERY and the MORNING OF SURGERY with CHG.   2. If you chose to wash your hair, wash your hair first as usual with your normal shampoo.  3. After you shampoo, rinse your hair and body thoroughly to remove the shampoo.  4. Use CHG as you would any other liquid soap. You can apply CHG directly to the skin and wash gently with a scrungie or a clean washcloth.   5. Apply the CHG Soap to your body ONLY FROM THE NECK DOWN.  Do not use on open wounds or open sores. Avoid contact with your eyes, ears, mouth and genitals (private parts). Wash Face and genitals (private parts)  with your normal soap.  6. Wash thoroughly, paying special attention to the area where your surgery will be performed.  7. Thoroughly rinse your body with warm water from the neck down.  8. DO NOT shower/wash with your normal soap after using and rinsing off the CHG Soap.  9. Pat yourself dry with a CLEAN TOWEL.  10. Wear  CLEAN PAJAMAS to bed the night before surgery, wear comfortable clothes the morning of surgery  11. Place CLEAN SHEETS on your bed the night of your first shower and DO NOT SLEEP WITH PETS.    Day of Surgery: Do not apply any deodorants/lotions. Please wear clean clothes to the hospital/surgery center.      Please read over the following fact sheets that you were given.

## 2017-06-09 ENCOUNTER — Other Ambulatory Visit (HOSPITAL_COMMUNITY): Payer: Self-pay

## 2017-06-09 ENCOUNTER — Encounter (HOSPITAL_COMMUNITY)
Admission: RE | Admit: 2017-06-09 | Discharge: 2017-06-09 | Disposition: A | Discharge status: Home / Self Care | Payer: Medicare Other | Source: Ambulatory Visit | Attending: Orthopedic Surgery | Admitting: Orthopedic Surgery

## 2017-06-09 ENCOUNTER — Other Ambulatory Visit: Payer: Self-pay

## 2017-06-09 ENCOUNTER — Encounter (HOSPITAL_COMMUNITY): Payer: Self-pay

## 2017-06-09 DIAGNOSIS — Z7951 Long term (current) use of inhaled steroids: Secondary | ICD-10-CM | POA: Diagnosis not present

## 2017-06-09 DIAGNOSIS — Z01818 Encounter for other preprocedural examination: Secondary | ICD-10-CM | POA: Insufficient documentation

## 2017-06-09 DIAGNOSIS — E785 Hyperlipidemia, unspecified: Secondary | ICD-10-CM | POA: Diagnosis not present

## 2017-06-09 DIAGNOSIS — Z853 Personal history of malignant neoplasm of breast: Secondary | ICD-10-CM | POA: Insufficient documentation

## 2017-06-09 DIAGNOSIS — Z96652 Presence of left artificial knee joint: Secondary | ICD-10-CM | POA: Diagnosis not present

## 2017-06-09 DIAGNOSIS — I10 Essential (primary) hypertension: Secondary | ICD-10-CM | POA: Insufficient documentation

## 2017-06-09 DIAGNOSIS — Z7902 Long term (current) use of antithrombotics/antiplatelets: Secondary | ICD-10-CM | POA: Diagnosis not present

## 2017-06-09 DIAGNOSIS — J449 Chronic obstructive pulmonary disease, unspecified: Secondary | ICD-10-CM | POA: Insufficient documentation

## 2017-06-09 DIAGNOSIS — Z01812 Encounter for preprocedural laboratory examination: Secondary | ICD-10-CM | POA: Insufficient documentation

## 2017-06-09 DIAGNOSIS — Z79899 Other long term (current) drug therapy: Secondary | ICD-10-CM | POA: Diagnosis not present

## 2017-06-09 DIAGNOSIS — Z87891 Personal history of nicotine dependence: Secondary | ICD-10-CM | POA: Insufficient documentation

## 2017-06-09 DIAGNOSIS — Z8673 Personal history of transient ischemic attack (TIA), and cerebral infarction without residual deficits: Secondary | ICD-10-CM | POA: Diagnosis not present

## 2017-06-09 HISTORY — DX: Nausea with vomiting, unspecified: R11.2

## 2017-06-09 HISTORY — DX: Other specified postprocedural states: Z98.890

## 2017-06-09 HISTORY — DX: Nausea with vomiting, unspecified: Z98.890

## 2017-06-09 LAB — CBC
HCT: 37.6 % (ref 36.0–46.0)
Hemoglobin: 12.3 g/dL (ref 12.0–15.0)
MCH: 28.9 pg (ref 26.0–34.0)
MCHC: 32.7 g/dL (ref 30.0–36.0)
MCV: 88.5 fL (ref 78.0–100.0)
Platelets: 209 10*3/uL (ref 150–400)
RBC: 4.25 MIL/uL (ref 3.87–5.11)
RDW: 13.3 % (ref 11.5–15.5)
WBC: 6.3 10*3/uL (ref 4.0–10.5)

## 2017-06-09 LAB — BASIC METABOLIC PANEL WITH GFR
Anion gap: 9 (ref 5–15)
BUN: 15 mg/dL (ref 6–20)
CO2: 27 mmol/L (ref 22–32)
Calcium: 9.5 mg/dL (ref 8.9–10.3)
Chloride: 103 mmol/L (ref 101–111)
Creatinine, Ser: 0.83 mg/dL (ref 0.44–1.00)
GFR calc Af Amer: >60 mL/min
GFR calc non Af Amer: >60 mL/min
Glucose, Bld: 101 mg/dL — ABNORMAL HIGH (ref 65–99)
Potassium: 3.3 mmol/L — ABNORMAL LOW (ref 3.5–5.1)
Sodium: 139 mmol/L (ref 135–145)

## 2017-06-09 LAB — SURGICAL PCR SCREEN
MRSA, PCR: NEGATIVE
STAPHYLOCOCCUS AUREUS: NEGATIVE

## 2017-06-09 NOTE — H&P (Addendum)
Patient ID: Alexandria [name] MRN: [id number] DOB/AGE: 75-Oct-1942 75 y.o.  Admit date: (Not on file)  Admission Diagnoses:  T 11 Compression Fracture  HPI: Pt has irregular HR, COPD, Asthma, CVA (2012).  Pt on Plavix.  She has stopped plavix and all supplements.   Past Medical History: Past Medical History:  Diagnosis Date  . Allergy   . Anemia    hx of   . Anxiety   . Arthritis    left knee, hands, back  . Asthma    daily and prn inhalers  . 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 problems with balance  . Breast cancer ([place]) 12/2011   right, ER+, PR -, Her 2 -  . COPD (chronic obstructive pulmonary disease) ([place])   . Degenerative joint disease of low back   . Depression   . Dysrhythmia    atrial fib   . GERD (gastroesophageal reflux disease)   . Hearing loss    bilateral hearing aids  . 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 History of kidney stones   . Hx of radiation therapy 03/12/12 -04/13/12   right breast  . Hyperlipidemia   . Hypertension    under control, has been on med. since age 53  . IBS (irritable bowel syndrome)   . Long term current use of aromatase inhibitor 05/08/2012  . Overactive bladder   . Palpitations   . Pneumonia    hx of x 2   . PVD (peripheral vascular disease) ([facility])    varicose veins - left worse than right  . Spinal stenosis   . Stress incontinence   . Stroke [facility])    problem with expressive communicaiton  . Traumatic hemopneumothorax 1982   left    Surgical History: Past Surgical History:  Procedure Laterality Date  . ANKLE HARDWARE REMOVAL     right  . BREAST BIOPSY  1976   right  . BREAST LUMPECTOMY  2013   right with snbx  . CHOLECYSTECTOMY  1990s  . CYSTOSCOPY  07/05/2005  . KNEE SURGERY  1980s   left  . ORIF ANKLE FRACTURE  1980s   right   . TOTAL KNEE ARTHROPLASTY Left 12/08/2015   Procedure: LEFT TOTAL KNEE ARTHROPLASTY;  Surgeon: Paralee Cancel, MD;   Location: [facility];  Service: Orthopedics;  Laterality: Left;  . TRANSOBTURATOR SLING  07/05/2005  . TUBAL LIGATION  1976  . VEIN SURGERY     vein ablation left leg    Family History: Family History  Problem Relation Age of Onset  . Cancer Maternal Aunt        breast  . Cancer Paternal Aunt        Multiple Myeloma  . Stomach cancer Paternal Uncle   . Breast cancer Paternal Uncle   . Breast cancer Cousin        early 58s  . Breast cancer Paternal Aunt        late 21s-60s  . Stroke Mother   . Heart attack Father        Died age 30  . Heart attack Paternal Grandfather   . Breast cancer Paternal Aunt        bilateral breast cancer  . Dementia Paternal Aunt   . Congestive Heart Failure Paternal Uncle        Ischemic  . Congestive Heart Failure Paternal Uncle        Ischemic  Social History: Social History   Socioeconomic History  . Marital status: Widowed    Spouse name: Not on file  . Number of children: Not on file  . Years of education: Not on file  . Highest education level: Not on file  Occupational History  . Not on file  Social Needs  . Financial resource strain: Not on file  . Food insecurity:    Worry: Not on file    Inability: Not on file  . Transportation needs:    Medical: Not on file    Non-medical: Not on file  Tobacco Use  . Smoking status: Former Smoker    Packs/day: 0.50    Years: 15.00    Pack years: 7.50    Types: Cigarettes    Last attempt to quit: 08/28/1993    Years since quitting: 23.7  . Smokeless tobacco: Never Used  Substance and Sexual Activity  . Alcohol use: No    Alcohol/week: 0.0 oz  . Drug use: No  . Sexual activity: Not Currently  Lifestyle  . Physical activity:    Days per week: Not on file    Minutes per session: Not on file  . Stress: Not on file  Relationships  . Social connections:    Talks on phone: Not on file    Gets together: Not on file    Attends religious service: Not on file    Active member of club or  organization: Not on file    Attends meetings of clubs or organizations: Not on file    Relationship status: Not on file  . Intimate partner violence:    Fear of current or ex partner: Not on file    Emotionally abused: Not on file    Physically abused: Not on file    Forced sexual activity: Not on file  Other Topics Concern  . Not on file  Social History Narrative   Widowed since 2014   Retired Scientist, product/process development    Son and daughter    Allergies: Aspirin; Chocolate; Coffee bean extract; Ibuprofen; Oxycodone hcl; Penicillins; Shrimp [shellfish allergy]; Sulfonamide derivatives; and Augmentin [amoxicillin-pot clavulanate]  Medications: I have reviewed the patient's current medications.  Vital Signs: No data found.  Radiology: No results found.  Labs: No results for input(s): WBC, RBC, HCT, PLT in the last 72 hours. No results for input(s): NA, K, CL, CO2, BUN, CREATININE, GLUCOSE, CALCIUM in the last 72 hours. No results for input(s): LABPT, INR in the last 72 hours.  Review of Systems: ROS  Physical Exam: There is no height or weight on file to calculate BMI.  Physical Exam  Constitutional: She appears well-developed and well-nourished.  HENT:  Head: Normocephalic.  Eyes: Pupils are equal, round, and reactive to light.  Neck: Normal range of motion.  Cardiovascular: Normal rate, regular rhythm and normal heart sounds.  Respiratory: Effort normal and breath sounds normal.  GI: Soft. Bowel sounds are normal.  Neurological: She is alert.  Skin: Skin is warm and dry.  Psychiatric: She has a normal mood and affect. Her behavior is normal. Judgment and thought content normal.   Assistive devices: No assistive devices used for ambulation Neuro: She has no focal motor deficits in the upper or lower extremity.  She has intermittent dysesthesias in both arms with the left side being slightly worse than the right.  5 out of 5 strength in the upper and lower extremities.  Negative  Babinski test, negative clonus, negative Hoffman test.  1+ symmetrical  deep tendon reflexes in the upper and lower extremities.  Negative nerve root tension signs. Musculoskeletal.  She has horrific midthoracic pain with direct palpation.  Moderate to significant neck pain with palpation and range of motion.  No significant shoulder, elbow, wrist pain with isolated joint range of motion.  Cervical MRI dated 05/06/17 was reviewed.  No cord signal changes.  Patient does have degenerative cervical disease C5-6 and C6-7 as well as C4-5.  Predominantly foraminal stenosis most pronounced on the left side.  Thoracic MRI from 03/29/17 was also reviewed.  Patient has a subacute T11 compression fracture with about 50% loss of vertebral height.  No posterior wall compromise.  No cord signal changes.   Assessment and Plan: Risks and benefits of surgery were discussed with the patient. These include: Infection, bleeding, death, stroke, paralysis, ongoing or worse pain, need for additional surgery, leak of spinal fluid, adjacent segment degeneration requiring additional surgery, post-operative hematoma formation that can result in neurological compromise and the need for urgent/emergent re-operation. Loss in bowel and bladder control. Injury to major vessels that could result in the need for urgent abdominal surgery to stop bleeding. Risk of deep venous thrombosis (DVT) and the need for additional treatment.   Goal of surgery: Reduce (not eliminate) pain, and improve quality of life.   Ronette Deter, PAC for Melina Schools, MD [place] [phone]  Patient continues to have significant lower thoracic pain secondary to acute to subacute T11 compression fracture.  Patient is failed conservative management and we are moving forward with a T11 kyphoplasty.  No change to her clinical exam this morning.  She continues to have significant pain, loss of quality of life, and difficulty sleeping because of  the pain.  No focal neurological deficits.  Risks and benefits were explained to the patient and her son and all their questions were encouraged and addressed.  Plan on moving forward with the T11 kyphoplasty this morning.  Given her underlying pulmonary disease and age more than likely we will keep her tonight and discharge her in the morning.

## 2017-06-09 NOTE — Progress Notes (Signed)
PCP - [name] Cardiologist - denies  Chest x-ray - not needed EKG - 3/6-19 Stress Test -2014  ECHO - 2014 Cardiac Cath - denies  Blood Thinner Instructions: last dose of plavix 06/08/17   Anesthesia review: yes,  Patient denies shortness of breath, fever, cough and chest pain at PAT appointment   Patient verbalized understanding of instructions that were given to them at the PAT appointment. Patient was also instructed that they will need to review over the PAT instructions again at home before surgery.

## 2017-06-12 NOTE — Progress Notes (Signed)
Anesthesia Chart Review:  Pt is a 77 year old female scheduled for T11 kyphoplasty on 06/15/2017 with Melina Schools, MD  - PCP is [name], NP who cleared pt for surgery at last office visit 05/17/17 - Oncologist is Nicholas Lose, MD. Last office visit 04/17/17; prn follow up recommended.   PMH includes:  Stroke, atrial fibrillation (I can find no support for this diagnosis), HTN, hyperlipidemia, COPD, asthma, breast cancer, post-op N/V, GERD. Former smoker (quit 1995). S/p L TKA 12/08/15. S/p breast lumpectomy 01/04/12  Medications include: Advair, albuterol, amlodipine, Lipitor, Plavix, HCTZ, Protonix, potassium, Zantac. Last dose plavix 06/08/17  BP (!) 145/72   Pulse 73   Temp 36.7 C   Resp 18   Ht 5' 3.5" (1.613 m)   Wt 140 lb 14.4 oz (63.9 kg)   SpO2 97%   BMI 24.57 kg/m   Preoperative labs reviewed.    EKG 05/17/17: sinus rhythm  Nuclear stress test 06/13/12:  - Normal stress nuclear study. - LV Ejection Fraction: 78%.  LV Wall Motion:  NL LV Function; NL Wall Motion  Echo 06/06/12:  - Left ventricle: The cavity size was normal. Wall thickness was normal. The estimated ejection fraction was 60%. Wall motion was normal; there were no regional wall motion abnormalities. - Impressions: No cardiac source of embolism was identified, but cannot be ruled out on the basis of this examination.  If no changes, I anticipate pt can proceed with surgery as scheduled.   Willeen Cass, FNP-BC [facility]/Anesthesiology Phone: [phone] 06/12/2017 2:12 PM

## 2017-06-14 ENCOUNTER — Other Ambulatory Visit: Payer: Self-pay | Admitting: Adult Health

## 2017-06-15 ENCOUNTER — Ambulatory Visit (HOSPITAL_COMMUNITY): Payer: Medicare Other | Admitting: Emergency Medicine

## 2017-06-15 ENCOUNTER — Encounter (HOSPITAL_COMMUNITY): Payer: Self-pay | Admitting: Urology

## 2017-06-15 ENCOUNTER — Ambulatory Visit (HOSPITAL_COMMUNITY): Payer: Medicare Other | Admitting: Anesthesiology

## 2017-06-15 ENCOUNTER — Ambulatory Visit (HOSPITAL_COMMUNITY): Payer: Medicare Other

## 2017-06-15 ENCOUNTER — Observation Stay (HOSPITAL_COMMUNITY)
Admission: RE | Admit: 2017-06-15 | Discharge: 2017-06-16 | Disposition: A | Discharge status: Home / Self Care | Payer: Medicare Other | Source: Ambulatory Visit | Attending: Orthopedic Surgery | Admitting: Orthopedic Surgery

## 2017-06-15 ENCOUNTER — Encounter (HOSPITAL_COMMUNITY)
Admission: RE | Disposition: A | Discharge status: Home / Self Care | Payer: Self-pay | Source: Ambulatory Visit | Attending: Orthopedic Surgery

## 2017-06-15 DIAGNOSIS — Z96652 Presence of left artificial knee joint: Secondary | ICD-10-CM | POA: Insufficient documentation

## 2017-06-15 DIAGNOSIS — I739 Peripheral vascular disease, unspecified: Secondary | ICD-10-CM | POA: Diagnosis not present

## 2017-06-15 DIAGNOSIS — K219 Gastro-esophageal reflux disease without esophagitis: Secondary | ICD-10-CM | POA: Diagnosis not present

## 2017-06-15 DIAGNOSIS — Z7902 Long term (current) use of antithrombotics/antiplatelets: Secondary | ICD-10-CM | POA: Insufficient documentation

## 2017-06-15 DIAGNOSIS — Z87891 Personal history of nicotine dependence: Secondary | ICD-10-CM | POA: Diagnosis not present

## 2017-06-15 DIAGNOSIS — Z9889 Other specified postprocedural states: Secondary | ICD-10-CM

## 2017-06-15 DIAGNOSIS — I69328 Other speech and language deficits following cerebral infarction: Secondary | ICD-10-CM | POA: Insufficient documentation

## 2017-06-15 DIAGNOSIS — I639 Cerebral infarction, unspecified: Secondary | ICD-10-CM

## 2017-06-15 DIAGNOSIS — Z853 Personal history of malignant neoplasm of breast: Secondary | ICD-10-CM | POA: Diagnosis not present

## 2017-06-15 DIAGNOSIS — J449 Chronic obstructive pulmonary disease, unspecified: Secondary | ICD-10-CM | POA: Insufficient documentation

## 2017-06-15 DIAGNOSIS — I10 Essential (primary) hypertension: Secondary | ICD-10-CM | POA: Diagnosis not present

## 2017-06-15 DIAGNOSIS — Z923 Personal history of irradiation: Secondary | ICD-10-CM | POA: Insufficient documentation

## 2017-06-15 DIAGNOSIS — M8088XA Other osteoporosis with current pathological fracture, vertebra(e), initial encounter for fracture: Principal | ICD-10-CM | POA: Insufficient documentation

## 2017-06-15 DIAGNOSIS — Z419 Encounter for procedure for purposes other than remedying health state, unspecified: Secondary | ICD-10-CM

## 2017-06-15 HISTORY — PX: KYPHOPLASTY: SHX5884

## 2017-06-15 SURGERY — KYPHOPLASTY
Anesthesia: General

## 2017-06-15 MED ORDER — THROMBIN 20000 UNITS EX SOLR
CUTANEOUS | Status: AC
  Filled 2017-06-15: qty 20000

## 2017-06-15 MED ORDER — PHENOL 1.4 % MT LIQD: 1.0000 | OROMUCOSAL | Status: DC | PRN

## 2017-06-15 MED ORDER — ONDANSETRON 4 MG PO TBDP: 4.0000 mg | ORAL_TABLET | Freq: Three times a day (TID) | ORAL | 0 refills | Status: DC | PRN

## 2017-06-15 MED ORDER — ROCURONIUM BROMIDE 10 MG/ML (PF) SYRINGE
PREFILLED_SYRINGE | INTRAVENOUS | Status: AC
  Filled 2017-06-15: qty 5

## 2017-06-15 MED ORDER — 0.9 % SODIUM CHLORIDE (POUR BTL) OPTIME
TOPICAL | Status: DC | PRN
  Administered 2017-06-15: 1000 mL

## 2017-06-15 MED ORDER — HYDROCODONE-ACETAMINOPHEN 5-325 MG PO TABS
1.0000 | ORAL_TABLET | ORAL | Status: DC | PRN
  Administered 2017-06-16: 1 via ORAL
  Filled 2017-06-15: qty 1

## 2017-06-15 MED ORDER — LIDOCAINE HCL (CARDIAC) 20 MG/ML IV SOLN
INTRAVENOUS | Status: AC
  Filled 2017-06-15: qty 5

## 2017-06-15 MED ORDER — PANTOPRAZOLE SODIUM 40 MG PO TBEC
40.0000 mg | DELAYED_RELEASE_TABLET | Freq: Every day | ORAL | Status: DC
  Administered 2017-06-16: 40 mg via ORAL
  Filled 2017-06-15: qty 1

## 2017-06-15 MED ORDER — EPHEDRINE 5 MG/ML INJ
INTRAVENOUS | Status: AC
  Filled 2017-06-15: qty 10

## 2017-06-15 MED ORDER — MOMETASONE FURO-FORMOTEROL FUM 100-5 MCG/ACT IN AERO
2.0000 | INHALATION_SPRAY | Freq: Two times a day (BID) | RESPIRATORY_TRACT | Status: DC
  Administered 2017-06-15 – 2017-06-16 (×2): 2 via RESPIRATORY_TRACT
  Filled 2017-06-15: qty 8.8

## 2017-06-15 MED ORDER — ACETAMINOPHEN 10 MG/ML IV SOLN
1000.0000 mg | Freq: Four times a day (QID) | INTRAVENOUS | Status: AC
  Administered 2017-06-15 (×4): 1000 mg via INTRAVENOUS
  Filled 2017-06-15 (×4): qty 100

## 2017-06-15 MED ORDER — ACETAMINOPHEN 325 MG PO TABS: 650.0000 mg | ORAL_TABLET | ORAL | Status: DC | PRN

## 2017-06-15 MED ORDER — FLUTICASONE PROPIONATE 50 MCG/ACT NA SUSP
2.0000 | Freq: Every day | NASAL | Status: DC | PRN
  Filled 2017-06-15: qty 16

## 2017-06-15 MED ORDER — BUPIVACAINE LIPOSOME 1.3 % IJ SUSP
INTRAMUSCULAR | Status: DC | PRN
  Administered 2017-06-15: 20 mL

## 2017-06-15 MED ORDER — MAGNESIUM CITRATE PO SOLN: 1.0000 | Freq: Once | ORAL | Status: DC | PRN

## 2017-06-15 MED ORDER — DOCUSATE SODIUM 100 MG PO CAPS
100.0000 mg | ORAL_CAPSULE | Freq: Two times a day (BID) | ORAL | Status: DC
  Administered 2017-06-15 – 2017-06-16 (×2): 100 mg via ORAL
  Filled 2017-06-15 (×2): qty 1

## 2017-06-15 MED ORDER — LACTATED RINGERS IV SOLN
INTRAVENOUS | Status: DC
  Administered 2017-06-15: 07:00:00 via INTRAVENOUS

## 2017-06-15 MED ORDER — HYDROCODONE-ACETAMINOPHEN 10-325 MG PO TABS
2.0000 | ORAL_TABLET | ORAL | Status: DC | PRN
  Administered 2017-06-15 (×2): 2 via ORAL
  Filled 2017-06-15 (×2): qty 2

## 2017-06-15 MED ORDER — SODIUM CHLORIDE 0.9% FLUSH
3.0000 mL | Freq: Two times a day (BID) | INTRAVENOUS | Status: DC
  Administered 2017-06-15: 3 mL via INTRAVENOUS

## 2017-06-15 MED ORDER — ALBUTEROL SULFATE (2.5 MG/3ML) 0.083% IN NEBU: 2.5000 mg | INHALATION_SOLUTION | RESPIRATORY_TRACT | Status: DC | PRN

## 2017-06-15 MED ORDER — METHOCARBAMOL 1000 MG/10ML IJ SOLN
500.0000 mg | Freq: Four times a day (QID) | INTRAMUSCULAR | Status: DC | PRN
  Filled 2017-06-15: qty 5

## 2017-06-15 MED ORDER — ONDANSETRON HCL 4 MG/2ML IJ SOLN
INTRAMUSCULAR | Status: DC | PRN
  Administered 2017-06-15: 4 mg via INTRAVENOUS

## 2017-06-15 MED ORDER — LACTATED RINGERS IV SOLN: INTRAVENOUS | Status: DC

## 2017-06-15 MED ORDER — DEXAMETHASONE SODIUM PHOSPHATE 10 MG/ML IJ SOLN
INTRAMUSCULAR | Status: DC | PRN
  Administered 2017-06-15: 10 mg via INTRAVENOUS

## 2017-06-15 MED ORDER — SODIUM CHLORIDE 0.9% FLUSH: 3.0000 mL | INTRAVENOUS | Status: DC | PRN

## 2017-06-15 MED ORDER — FENTANYL CITRATE (PF) 100 MCG/2ML IJ SOLN: 25.0000 ug | INTRAMUSCULAR | Status: DC | PRN

## 2017-06-15 MED ORDER — VANCOMYCIN HCL IN DEXTROSE 1-5 GM/200ML-% IV SOLN
1000.0000 mg | INTRAVENOUS | Status: AC
  Administered 2017-06-15: 1000 mg via INTRAVENOUS

## 2017-06-15 MED ORDER — BUPIVACAINE LIPOSOME 1.3 % IJ SUSP
20.0000 mL | Freq: Once | INTRAMUSCULAR | Status: DC
  Filled 2017-06-15: qty 20

## 2017-06-15 MED ORDER — DEXAMETHASONE SODIUM PHOSPHATE 10 MG/ML IJ SOLN
INTRAMUSCULAR | Status: AC
  Filled 2017-06-15: qty 1

## 2017-06-15 MED ORDER — POLYETHYLENE GLYCOL 3350 17 G PO PACK: 17.0000 g | PACK | Freq: Every day | ORAL | Status: DC | PRN

## 2017-06-15 MED ORDER — ONDANSETRON HCL 4 MG PO TABS: 4.0000 mg | ORAL_TABLET | Freq: Four times a day (QID) | ORAL | Status: DC | PRN

## 2017-06-15 MED ORDER — BUPIVACAINE-EPINEPHRINE (PF) 0.25% -1:200000 IJ SOLN
INTRAMUSCULAR | Status: AC
  Filled 2017-06-15: qty 30

## 2017-06-15 MED ORDER — SUGAMMADEX SODIUM 200 MG/2ML IV SOLN
INTRAVENOUS | Status: DC | PRN
  Administered 2017-06-15: 150 mg via INTRAVENOUS

## 2017-06-15 MED ORDER — LORATADINE 10 MG PO TABS: 10.0000 mg | ORAL_TABLET | Freq: Every day | ORAL | Status: DC | PRN

## 2017-06-15 MED ORDER — PHENYLEPHRINE HCL 10 MG/ML IJ SOLN
INTRAMUSCULAR | Status: DC | PRN
  Administered 2017-06-15: 80 ug via INTRAVENOUS
  Administered 2017-06-15: 120 ug via INTRAVENOUS
  Administered 2017-06-15: 80 ug via INTRAVENOUS

## 2017-06-15 MED ORDER — ESCITALOPRAM OXALATE 10 MG PO TABS
10.0000 mg | ORAL_TABLET | Freq: Every day | ORAL | Status: DC
  Administered 2017-06-15: 10 mg via ORAL
  Filled 2017-06-15: qty 1

## 2017-06-15 MED ORDER — ONDANSETRON HCL 4 MG/2ML IJ SOLN: 4.0000 mg | Freq: Once | INTRAMUSCULAR | Status: DC | PRN

## 2017-06-15 MED ORDER — FENTANYL CITRATE (PF) 100 MCG/2ML IJ SOLN
INTRAMUSCULAR | Status: DC | PRN
  Administered 2017-06-15: 50 ug via INTRAVENOUS
  Administered 2017-06-15: 100 ug via INTRAVENOUS

## 2017-06-15 MED ORDER — ROCURONIUM BROMIDE 100 MG/10ML IV SOLN
INTRAVENOUS | Status: DC | PRN
  Administered 2017-06-15: 40 mg via INTRAVENOUS

## 2017-06-15 MED ORDER — VANCOMYCIN HCL IN DEXTROSE 1-5 GM/200ML-% IV SOLN
INTRAVENOUS | Status: AC
  Administered 2017-06-15: 1000 mg via INTRAVENOUS
  Filled 2017-06-15: qty 200

## 2017-06-15 MED ORDER — METHOCARBAMOL 500 MG PO TABS
500.0000 mg | ORAL_TABLET | Freq: Four times a day (QID) | ORAL | Status: DC | PRN
  Administered 2017-06-15 – 2017-06-16 (×2): 500 mg via ORAL
  Filled 2017-06-15 (×2): qty 1

## 2017-06-15 MED ORDER — ONDANSETRON HCL 4 MG/2ML IJ SOLN
INTRAMUSCULAR | Status: AC
  Filled 2017-06-15: qty 2

## 2017-06-15 MED ORDER — PROPOFOL 10 MG/ML IV BOLUS
INTRAVENOUS | Status: AC
  Filled 2017-06-15: qty 20

## 2017-06-15 MED ORDER — IOPAMIDOL (ISOVUE-300) INJECTION 61%
INTRAVENOUS | Status: AC
  Filled 2017-06-15: qty 50

## 2017-06-15 MED ORDER — ACETAMINOPHEN 650 MG RE SUPP: 650.0000 mg | RECTAL | Status: DC | PRN

## 2017-06-15 MED ORDER — FENTANYL CITRATE (PF) 250 MCG/5ML IJ SOLN
INTRAMUSCULAR | Status: AC
  Filled 2017-06-15: qty 5

## 2017-06-15 MED ORDER — SUGAMMADEX SODIUM 200 MG/2ML IV SOLN
INTRAVENOUS | Status: AC
  Filled 2017-06-15: qty 2

## 2017-06-15 MED ORDER — LIDOCAINE HCL (CARDIAC) 20 MG/ML IV SOLN
INTRAVENOUS | Status: DC | PRN
  Administered 2017-06-15: 100 mg via INTRAVENOUS

## 2017-06-15 MED ORDER — AMLODIPINE BESYLATE 5 MG PO TABS: 10.0000 mg | ORAL_TABLET | Freq: Every day | ORAL | Status: DC

## 2017-06-15 MED ORDER — MENTHOL 3 MG MT LOZG: 1.0000 | LOZENGE | OROMUCOSAL | Status: DC | PRN

## 2017-06-15 MED ORDER — VANCOMYCIN HCL 10 G IV SOLR
1250.0000 mg | Freq: Once | INTRAVENOUS | Status: AC
  Administered 2017-06-15: 1250 mg via INTRAVENOUS
  Filled 2017-06-15: qty 1250

## 2017-06-15 MED ORDER — ONDANSETRON HCL 4 MG/2ML IJ SOLN: 4.0000 mg | Freq: Four times a day (QID) | INTRAMUSCULAR | Status: DC | PRN

## 2017-06-15 MED ORDER — IOPAMIDOL (ISOVUE-300) INJECTION 61%
INTRAVENOUS | Status: DC | PRN
  Administered 2017-06-15: 50 mL

## 2017-06-15 MED ORDER — BUPIVACAINE-EPINEPHRINE 0.25% -1:200000 IJ SOLN
INTRAMUSCULAR | Status: DC | PRN
  Administered 2017-06-15: 30 mL

## 2017-06-15 MED ORDER — PROPOFOL 10 MG/ML IV BOLUS
INTRAVENOUS | Status: DC | PRN
  Administered 2017-06-15: 100 mg via INTRAVENOUS

## 2017-06-15 MED ORDER — PHENYLEPHRINE 40 MCG/ML (10ML) SYRINGE FOR IV PUSH (FOR BLOOD PRESSURE SUPPORT)
PREFILLED_SYRINGE | INTRAVENOUS | Status: AC
  Filled 2017-06-15: qty 10

## 2017-06-15 MED ORDER — HYDROCODONE-ACETAMINOPHEN 5-325 MG PO TABS: 1.0000 | ORAL_TABLET | Freq: Four times a day (QID) | ORAL | 0 refills | Status: AC | PRN

## 2017-06-15 MED ORDER — CLOPIDOGREL BISULFATE 75 MG PO TABS: 75.0000 mg | ORAL_TABLET | Freq: Every day | ORAL | 1 refills | Status: DC

## 2017-06-15 SURGICAL SUPPLY — 42 items
BANDAGE ADH SHEER 1  50/CT (GAUZE/BANDAGES/DRESSINGS) ×4 IMPLANT
BLADE SURG 15 STRL LF DISP TIS (BLADE) ×1 IMPLANT
BLADE SURG 15 STRL SS (BLADE) ×1
BNDG COHESIVE 3X5 WHT NS (GAUZE/BANDAGES/DRESSINGS) ×2 IMPLANT
BONE FILLER DEVICE STRL SZ3 (INSTRUMENTS) IMPLANT
CEMENT BONE KYPHON CDS (Cement) ×2 IMPLANT
CEMENT KYPHON CX01A KIT/MIXER (Cement) ×2 IMPLANT
COVER LIGHT HANDLE  DEROYL (MISCELLANEOUS) ×2 IMPLANT
CURETTE WEDGE 8.5MM KYPHX (MISCELLANEOUS) ×2 IMPLANT
DERMABOND ADVANCED (GAUZE/BANDAGES/DRESSINGS) ×1
DERMABOND ADVANCED .7 DNX12 (GAUZE/BANDAGES/DRESSINGS) ×1 IMPLANT
DRAPE C-ARM 42X72 X-RAY (DRAPES) ×4 IMPLANT
DRAPE INCISE IOBAN 66X45 STRL (DRAPES) ×2 IMPLANT
DRAPE LAPAROTOMY T 102X78X121 (DRAPES) ×2 IMPLANT
DRAPE SURG 17X23 STRL (DRAPES) ×2 IMPLANT
DRAPE U-SHAPE 47X51 STRL (DRAPES) ×2 IMPLANT
DURAPREP 26ML APPLICATOR (WOUND CARE) ×2 IMPLANT
GLOVE BIO SURGEON STRL SZ 6.5 (GLOVE) ×2 IMPLANT
GLOVE BIOGEL PI IND STRL 6.5 (GLOVE) ×1 IMPLANT
GLOVE BIOGEL PI IND STRL 8.5 (GLOVE) ×1 IMPLANT
GLOVE BIOGEL PI INDICATOR 6.5 (GLOVE) ×1
GLOVE BIOGEL PI INDICATOR 8.5 (GLOVE) ×1
GLOVE SS BIOGEL STRL SZ 8.5 (GLOVE) ×1 IMPLANT
GLOVE SUPERSENSE BIOGEL SZ 8.5 (GLOVE) ×1
GOWN STRL REUS W/ TWL LRG LVL3 (GOWN DISPOSABLE) ×2 IMPLANT
GOWN STRL REUS W/TWL 2XL LVL3 (GOWN DISPOSABLE) ×2 IMPLANT
GOWN STRL REUS W/TWL LRG LVL3 (GOWN DISPOSABLE) ×2
KIT BASIN OR (CUSTOM PROCEDURE TRAY) ×2 IMPLANT
KIT TURNOVER KIT B (KITS) ×2 IMPLANT
NEEDLE 21X1 OR PACK (NEEDLE) ×2 IMPLANT
NEEDLE SPNL 18GX3.5 QUINCKE PK (NEEDLE) ×4 IMPLANT
NS IRRIG 1000ML POUR BTL (IV SOLUTION) ×2 IMPLANT
PACK SURGICAL SETUP 50X90 (CUSTOM PROCEDURE TRAY) ×2 IMPLANT
PACK UNIVERSAL I (CUSTOM PROCEDURE TRAY) ×2 IMPLANT
PAD ARMBOARD 7.5X6 YLW CONV (MISCELLANEOUS) ×6 IMPLANT
SPONGE LAP 4X18 X RAY DECT (DISPOSABLE) ×2 IMPLANT
SUT MNCRL AB 3-0 PS2 18 (SUTURE) ×2 IMPLANT
SYR CONTROL 10ML LL (SYRINGE) ×2 IMPLANT
TOWEL OR 17X26 10 PK STRL BLUE (TOWEL DISPOSABLE) ×2 IMPLANT
TRAY KYPHOPAK 15/3 ONESTEP 1ST (MISCELLANEOUS) ×2 IMPLANT
TRAY KYPHOPAK 20/3 ONESTEP 1ST (MISCELLANEOUS) IMPLANT
WATER STERILE IRR 1000ML POUR (IV SOLUTION) ×2 IMPLANT

## 2017-06-15 NOTE — Progress Notes (Signed)
Pharmacy Antibiotic Note  Alexandria [name] is a 77 y.o. female admitted on 06/15/2017 with T11 osteoporotic compression fracture.  S/p kyphoplasty 06/15/17.  Pharmacy has been consulted for Vancomycin dosing for surgical prophylaxis. Preop Vanc 1gm IV x1 given this AM. Drains: None.   Plan: Vancomycin 1250 mg IV x1 at 22:00 tonight. Pharmacy will sign off.  Height: 5 ft 3.5 inches Weight : 63.9 kg  Temp (24hrs), Avg:98 F (36.7 C), Min:97.6 F (36.4 C), Max:98.2 F (36.8 C)  Recent Labs  Lab 06/09/17 1527  WBC 6.3  CREATININE 0.83    Estimated Creatinine Clearance: 50.3 mL/min (by C-G formula based on SCr of 0.83 mg/dL).    Allergies  Allergen Reactions  . Augmentin [Amoxicillin-Pot Clavulanate] Itching, Rash and Other (See Comments)    PT DEVELOPED SEVERE RASH INVOLVING MUCUS MEMBRANES or SKIN NECROSIS WITH PENICILLINS: #  #  #  YES  #  #  #    . Penicillins Hives and Other (See Comments)    Has patient had a PCN reaction causing immediate rash, facial/tongue/throat swelling, SOB or lightheadedness with hypotension: No HAS PT DEVELOPED SEVERE RASH INVOLVING MUCUS MEMBRANES or SKIN NECROSIS: #  #  #  YES  #  #  #   Has patient had a PCN reaction that required hospitalization: No Has patient had a PCN reaction occurring within the last 10 years: No   . Aspirin Hives  . Chocolate Hives and Other (See Comments)    RUNNY NOSE   . Coffee Bean Extract Hives and Other (See Comments)    RUNNY NOSE   . Ibuprofen Hives and Other (See Comments)    RUNNY NOSE  . Oxycodone Hcl Hives and Nausea And Vomiting  . Shrimp [Shellfish Allergy] Hives and Other (See Comments)    RUNNY NOSE   . Sulfonamide Derivatives Hives     Thank you for allowing pharmacy to be a part of this patient's care. Nicole Cella, RPh Clinical Pharmacist Pager: [phone] 8A-4P [phone] 4P-10P [place] [phone] 06/15/2017 12:06 PM

## 2017-06-15 NOTE — Evaluation (Signed)
Physical Therapy Evaluation Patient Details Name: Alexandria [name] MRN: [id number] DOB: 03-29-1940 Today's Date: 06/15/2017   History of Present Illness  Pt is a 77 y/o female s/p T11 kyphoplasty secondary to T11 compression fx. PMH includes L TKA, ORIF of R ankle fx, R breast cancer, COPD, HTN, PVD, and CVA.   Clinical Impression  Patient is s/p above surgery resulting in the deficits listed below (see PT Problem List). Pt unsteady during gait and required min to min guard A with HHA. Educated about using RW at home to increase safety. Educated about back precautions and generalized walking program to perform at home. Patient will benefit from skilled PT to increase their independence and safety with mobility (while adhering to their precautions) to allow discharge to the venue listed below.     Follow Up Recommendations Home health PT;Supervision for mobility/OOB(for safety eval )    Equipment Recommendations  None recommended by PT    Recommendations for Other Services       Precautions / Restrictions Precautions Precautions: Back Precaution Booklet Issued: Yes (comment) Precaution Comments: Reviewed back precautions with pt.  Restrictions Weight Bearing Restrictions: No      Mobility  Bed Mobility Overal bed mobility: Needs Assistance Bed Mobility: Rolling;Sidelying to Sit;Sit to Sidelying Rolling: Supervision Sidelying to sit: Min assist     Sit to sidelying: Min assist General bed mobility comments: Min A for trunk elevation to come up to sitting and min A for LE lift assist for return to supine. Verbal cues for sequencing using log roll technique.   Transfers Overall transfer level: Needs assistance Equipment used: 1 person hand held assist Transfers: Sit to/from Stand Sit to Stand: Min assist         General transfer comment: Min A for lift assist and steadying assist as pt with LOB upon standing.   Ambulation/Gait Ambulation/Gait assistance: Min guard;Min  assist Ambulation Distance (Feet): 125 Feet Assistive device: 1 person hand held assist Gait Pattern/deviations: Step-through pattern;Decreased stride length Gait velocity: Decreased  Gait velocity interpretation: Below normal speed for age/gender General Gait Details: Slow, unsteady gait with occasional LOB requiring min A for steadying. Educated to use RW at home to increase stability. Educated about generalized walking program to perform at home.   Stairs            Wheelchair Mobility    Modified Rankin (Stroke Patients Only)       Balance Overall balance assessment: Needs assistance Sitting-balance support: No upper extremity supported;Feet supported Sitting balance-Leahy Scale: Good     Standing balance support: Single extremity supported;During functional activity Standing balance-Leahy Scale: Poor Standing balance comment: Reliant on UE and external support for standing.                              Pertinent Vitals/Pain Pain Assessment: Faces Faces Pain Scale: Hurts little more Pain Location: back  Pain Descriptors / Indicators: Aching;Operative site guarding Pain Intervention(s): Limited activity within patient's tolerance;Monitored during session;Repositioned    Home Living Family/patient expects to be discharged to:: Private residence Living Arrangements: Alone Available Help at Discharge: Friend(s);Available PRN/intermittently Type of Home: House Home Access: Stairs to enter Entrance Stairs-Rails: Right;Left;Can reach both Entrance Stairs-Number of Steps: 7 Home Layout: One level Home Equipment: Walker - 2 wheels;Walker - 4 wheels;Cane - single point;Shower seat;Grab bars - toilet;Grab bars - tub/shower(rail in the hall )      Prior Function Level of Independence: Independent  with assistive device(s)         Comments: Used RW vs cane for ambulation      Hand Dominance        Extremity/Trunk Assessment   Upper Extremity  Assessment Upper Extremity Assessment: Defer to OT evaluation    Lower Extremity Assessment Lower Extremity Assessment: Generalized weakness    Cervical / Trunk Assessment Cervical / Trunk Assessment: Other exceptions Cervical / Trunk Exceptions: s/p kyphoplasty   Communication   Communication: No difficulties  Cognition Arousal/Alertness: Awake/alert Behavior During Therapy: WFL for tasks assessed/performed Overall Cognitive Status: Within Functional Limits for tasks assessed                                        General Comments General comments (skin integrity, edema, etc.): Pt reports she will have assist for IADL tasks and for feeding her dogs.     Exercises     Assessment/Plan    PT Assessment Patient needs continued PT services  PT Problem List Decreased strength;Decreased balance;Decreased mobility;Decreased knowledge of use of DME;Decreased knowledge of precautions;Pain       PT Treatment Interventions DME instruction;Gait training;Stair training;Functional mobility training;Therapeutic activities;Therapeutic exercise;Balance training;Neuromuscular re-education;Patient/family education    PT Goals (Current goals can be found in the Care Plan section)  Acute Rehab PT Goals Patient Stated Goal: to go home  PT Goal Formulation: With patient Time For Goal Achievement: 06/29/17 Potential to Achieve Goals: Good    Frequency Min 5X/week   Barriers to discharge Decreased caregiver support      Co-evaluation               AM-PAC PT "6 Clicks" Daily Activity  Outcome Measure Difficulty turning over in bed (including adjusting bedclothes, sheets and blankets)?: A Little Difficulty moving from lying on back to sitting on the side of the bed? : Unable Difficulty sitting down on and standing up from a chair with arms (e.g., wheelchair, bedside commode, etc,.)?: Unable Help needed moving to and from a bed to chair (including a wheelchair)?: A  Little Help needed walking in hospital room?: A Little Help needed climbing 3-5 steps with a railing? : A Lot 6 Click Score: 13    End of Session Equipment Utilized During Treatment: Gait belt Activity Tolerance: Patient tolerated treatment well Patient left: in bed;with call bell/phone within reach Nurse Communication: Mobility status PT Visit Diagnosis: Other abnormalities of gait and mobility (R26.89);Pain;Unsteadiness on feet (R26.81) Pain - part of body: (back )    Time: 2542-7062 PT Time Calculation (min) (ACUTE ONLY): 26 min   Charges:   PT Evaluation $PT Eval Low Complexity: 1 Low PT Treatments $Gait Training: 8-22 mins   PT G Codes:        Leighton Ruff, PT, DPT  Acute Rehabilitation Services  Pager: [phone]   Rudean Hitt 06/15/2017, 3:57 PM

## 2017-06-15 NOTE — Anesthesia Procedure Notes (Signed)
Procedure Name: Intubation Date/Time: 06/15/2017 7:52 AM Performed by: Jenne Campus, CRNA Pre-anesthesia Checklist: Patient identified, Emergency Drugs available, Suction available and Patient being monitored Patient Re-evaluated:Patient Re-evaluated prior to induction Oxygen Delivery Method: Circle System Utilized Preoxygenation: Pre-oxygenation with 100% oxygen Induction Type: IV induction Ventilation: Mask ventilation without difficulty and Oral airway inserted - appropriate to patient size Laryngoscope Size: Sabra Heck and 2 Grade View: Grade I Tube type: Oral Tube size: 7.0 mm Number of attempts: 1 Airway Equipment and Method: Stylet and Oral airway Placement Confirmation: ETT inserted through vocal cords under direct vision,  positive ETCO2 and breath sounds checked- equal and bilateral Secured at: 22 cm Tube secured with: Tape Dental Injury: Teeth and Oropharynx as per pre-operative assessment  Comments: Patient stated she has "neck pain" in her cervical area. "Needs neck surgery." Cervical spine maintained in neutral position during induction and intubation.

## 2017-06-15 NOTE — Anesthesia Postprocedure Evaluation (Signed)
Anesthesia Post Note  Patient: Alexandria [name]  Procedure(s) Performed: KYPHOPLASTY T11 (N/A )     Patient location during evaluation: PACU Anesthesia Type: General Level of consciousness: awake and alert, oriented and patient cooperative Pain management: pain level controlled Vital Signs Assessment: post-procedure vital signs reviewed and stable Respiratory status: spontaneous breathing, nonlabored ventilation and respiratory function stable Cardiovascular status: blood pressure returned to baseline and stable Postop Assessment: no apparent nausea or vomiting Anesthetic complications: no    Last Vitals:  Vitals:   06/15/17 0945 06/15/17 1012  BP: 133/69 (!) 144/55  Pulse: 79 81  Resp: 12 17  Temp: 36.7 C 36.8 C  SpO2: 95% 98%    Last Pain:  Vitals:   06/15/17 1012  TempSrc: Oral  PainSc:                  Zander Ingham,E. Giannie Soliday

## 2017-06-15 NOTE — Telephone Encounter (Signed)
Sent to the pharmacy by e-scribe. 

## 2017-06-15 NOTE — Anesthesia Preprocedure Evaluation (Addendum)
Anesthesia Evaluation  Patient identified by MRN, date of birth, ID band Patient awake    Reviewed: Allergy & Precautions, NPO status , Patient's Chart, lab work & pertinent test results  History of Anesthesia Complications (+) PONV and history of anesthetic complications  Airway Mallampati: II  TM Distance: >3 FB Neck ROM: Full    Dental  (+) Teeth Intact, Dental Advisory Given   Pulmonary asthma , COPD,  COPD inhaler, former smoker,    breath sounds clear to auscultation       Cardiovascular hypertension, Pt. on medications + Peripheral Vascular Disease  + dysrhythmias (sinus bradycardia, palpitations; h/o afib) Atrial Fibrillation  Rhythm:Regular Rate:Normal  HLD  EKG 11/26/2015: Sinus bradycardia  Myocardial Perfusion Imaging 06/13/2012: Negative for ischemia  TTE 06/06/2012: Study Conclusions  - Left ventricle: The cavity size was normal. Wall thickness was normal. The estimated ejection fraction was 60%. Wall motion was normal; there were no regional wall motion abnormalities. - Impressions: No cardiac source of embolism was identified, but cannot be ruled out on the basis of this examination. Impressions:   Neuro/Psych PSYCHIATRIC DISORDERS Anxiety Depression Spinal stenosis CVA (2013, expressive communication difficulty), Residual Symptoms    GI/Hepatic Neg liver ROS, GERD  Medicated and Controlled,IBS   Endo/Other  negative endocrine ROS  Renal/GU H/o glomerulonephritis as a child  Female GU complaint     Musculoskeletal  (+) Arthritis , Osteoarthritis,    Abdominal   Peds  Hematology  (+) Blood dyscrasia, anemia ,   Anesthesia Other Findings H/o radiation for breast cancer 2013/2014, hearing loss  Reproductive/Obstetrics                           Anesthesia Physical  Anesthesia Plan  ASA: III  Anesthesia Plan: General   Post-op Pain Management:     Induction: Intravenous  PONV Risk Score and Plan: 4 or greater and Treatment may vary due to age or medical condition, Ondansetron and Dexamethasone  Airway Management Planned: Oral ETT  Additional Equipment: None  Intra-op Plan:   Post-operative Plan: Extubation in OR  Informed Consent: I have reviewed the patients History and Physical, chart, labs and discussed the procedure including the risks, benefits and alternatives for the proposed anesthesia with the patient or authorized representative who has indicated his/her understanding and acceptance.   Dental advisory given  Plan Discussed with: CRNA and Anesthesiologist  Anesthesia Plan Comments:         Anesthesia Quick Evaluation

## 2017-06-15 NOTE — Transfer of Care (Signed)
Immediate Anesthesia Transfer of Care Note  Patient: Alexandria [name]  Procedure(s) Performed: KYPHOPLASTY T11 (N/A )  Patient Location: PACU  Anesthesia Type:General  Level of Consciousness: awake, oriented and patient cooperative  Airway & Oxygen Therapy: Patient Spontanous Breathing and Patient connected to nasal cannula oxygen  Post-op Assessment: Report given to RN and Post -op Vital signs reviewed and stable  Post vital signs: Reviewed  Last Vitals:  Vitals Value Taken Time  BP 135/51 06/15/2017  9:00 AM  Temp 36.4 C 06/15/2017  9:00 AM  Pulse 90 06/15/2017  9:00 AM  Resp 13 06/15/2017  9:00 AM  SpO2 98 % 06/15/2017  9:00 AM  Vitals shown include unvalidated device data.  Last Pain:  Vitals:   06/15/17 0900  PainSc: (P) 0-No pain         Complications: No apparent anesthesia complications

## 2017-06-15 NOTE — Discharge Instructions (Signed)
Balloon Kyphoplasty °Balloon kyphoplasty is a procedure to treat a spinal compression fracture, which is a collapse of the bones that form the spine (vertebrae). With this type of fracture, the vertebrae become squashed (compressed) into a wedge shape, and this causes pain. In this procedure, the collapsed vertebrae are expanded with a balloon, and bone cement is injected into them to strengthen them. °Tell a health care provider about: °· Any allergies you have. °· All medicines you are taking, including vitamins, herbs, eye drops, creams, and over-the-counter medicines. °· Any problems you or family members have had with anesthetic medicines. °· Any blood disorders you have. °· Any surgeries you have had. °· Any medical conditions you have. °· Whether you are pregnant or may be pregnant. °What are the risks? °Generally, this is a safe procedure. However, problems may occur, including: °· Infection. °· Bleeding. °· Allergic reactions to medicines. °· Damage to other structures or organs. °· Leaking of bone cement into other parts of the body. ° °What happens before the procedure? °· Follow instructions from your health care provider about eating or drinking restrictions. °· Ask your health care provider about: °? Changing or stopping your regular medicines. This is especially important if you are taking diabetes medicines or blood thinners. °? Taking medicines such as aspirin and ibuprofen. These medicines can thin your blood. Do not take these medicines before your procedure if your health care provider instructs you not to. °· Ask your health care provider how your surgical site will be marked or identified. °· You may be given antibiotic medicine to help prevent infection. °· Do not use tobacco products, including cigarettes, chewing tobacco, or e-cigarettes. If you need help quitting, ask your health care provider. °· Plan to have someone take you home after the procedure. °· If you go home right after the  procedure, plan to have someone with you for 24 hours. °What happens during the procedure? °· To reduce your risk of infection: °? Your health care team will wash or sanitize their hands. °? Your skin will be washed with soap. °· An IV tube will be inserted into one of your veins. °· You will be given one or more of the following: °? A medicine to help you relax (sedative). °? A medicine to numb the area (local anesthetic). °? A medicine to make you fall asleep (general anesthetic). °· Your surgeon will use an X-ray machine to see your spinal compression fracture. °· Two small incisions will be made near your spine. °· A thin tube will be inserted into your spine. Through this tube, the balloon will be placed in your spine where the fractures are. °· The balloon will be inflated. This will create space and push the bone back toward its normal height and shape. °· The balloon will be removed. °· The newly created space in your spine will be filled with bone cement. °· When the cement hardens, the tube in your spine will be removed. °· Your incisions will be closed with stitches (sutures), skin glue, or adhesive strips. °· A bandage (dressing) may be used to cover your incisions. °The procedure may vary among health care providers and hospitals. °What happens after the procedure? °· Your blood pressure, heart rate, breathing rate, and blood oxygen level will be monitored often until the medicines you were given have worn off. °· You will have some pain. Pain medicine will be available to help you. °This information is not intended to replace advice given to you   by your health care provider. Make sure you discuss any questions you have with your health care provider. °Document Released: 02/04/2004 Document Revised: 08/06/2015 Document Reviewed: 06/23/2014 °Elsevier Interactive Patient Education © 2018 [employer] ° °

## 2017-06-15 NOTE — Brief Op Note (Signed)
06/15/2017  8:43 AM  PATIENT:  Alexandria [name]  77 y.o. female  PRE-OPERATIVE DIAGNOSIS:  T11 Compression fracture  POST-OPERATIVE DIAGNOSIS:  T11 Compression fracture  PROCEDURE:  Procedure(s) with comments: KYPHOPLASTY T11 (N/A) - 90 mins  SURGEON:  Surgeon(s) and Role:    Melina Schools, MD - Primary  PHYSICIAN ASSISTANT:   ASSISTANTS: Carmen Mayo   ANESTHESIA:   general  EBL:  minimal   BLOOD ADMINISTERED:none  DRAINS: none   LOCAL MEDICATIONS USED:  MARCAINE    and OTHER exparel  SPECIMEN:  No Specimen  DISPOSITION OF SPECIMEN:  N/A  COUNTS:  YES  TOURNIQUET:  * No tourniquets in log *  DICTATION: .Dragon Dictation  PLAN OF CARE: Admit for overnight observation  PATIENT DISPOSITION:  PACU - hemodynamically stable.

## 2017-06-15 NOTE — Op Note (Signed)
Operative report  Preoperative diagnosis: T11 osteoporotic compression fracture  Postoperative diagnosis: Same  Operative procedure: T11 kyphoplasty  Complications: None  First assistant [facility], [place]  Indications: This is a very pleasant 77 year old woman has been having significant lower thoracic pain for some time now.  Imaging studies confirmed an acute to subacute T11 osteoporotic compression fracture.  Attempts at conservative management failed to alleviate her symptoms and so we elected to proceed with surgery.  All appropriate risks benefits and alternatives were discussed with the patient and consent was obtained.  Operative report.  Patient was brought to the operating room placed upon the operating room table.  After successful induction of general anesthesia and endotracheal intubation teds SCDs were applied she was turned prone onto the flat [place] table.  A chest and pelvic roll were placed in the arms were tucked at the side and secured.  The thoracolumbar spine was then prepped and draped in standard fashion.  Timeout was taken to confirm patient procedure and all other important data.  Fluoroscopic machines were brought into the field 1 in the AP and the other in the lateral plane to accomplish biplane fluoroscopy.  Identified the T11 vertebral body in both planes.  Identified the lateral border of the T11 pedicle and anesthetized the skin with quarter percent Marcaine with Exparel.  A small stab incision was made and I advanced the Jamshidi needle down to the lateral aspect of the pedicle of T11.  Using biplane fluoroscopy I advanced the Jamshidi needle confirming trajectory and position quite frequently.  Once I was nearing the medial wall of the pedicle on the AP view I confirmed I was just beyond the posterior wall of the vertebral body on the lateral view.  I then advanced into the vertebral body.  I repeated this exact same technique on the contralateral side.  Once both  pedicles were cannulated I then inserted the drill and then probed the whole.  I then inserted the inflatable bone tamps from [employer].  I inflated approximately 2-1/2 cc of fluid into the balloon.  I then removed one balloon and then inserted 1/2 cc cement and then deflated the other balloon inserted another 1/2 cc of cement.  Again using fluoroscopy I confirmed that the cement was properly positioned.  I then put in about another half cc of cement for a total of 4 cc of cement.  Final AP and lateral fluoroscopy views were taken and the cement was well positioned there was no leak.  I had excellent fill crossing the midline with interdigitation into the vertebral body.  Jamshidi needles were removed and I injected the remaining portion of Marcaine mixed with Exparel for postoperative analgesia.  The wounds were then cleaned closed with a 3-0 Monocryl and a Band-Aid.  Patient was then extubated and transferred to the PACU without incident.  The end of the case all needle sponge counts were correct.  There were no adverse intraoperative events.

## 2017-06-16 ENCOUNTER — Encounter (HOSPITAL_COMMUNITY): Payer: Self-pay | Admitting: Orthopedic Surgery

## 2017-06-16 DIAGNOSIS — M8088XA Other osteoporosis with current pathological fracture, vertebra(e), initial encounter for fracture: Secondary | ICD-10-CM | POA: Diagnosis not present

## 2017-06-16 NOTE — Evaluation (Addendum)
Occupational Therapy Evaluation Patient Details Name: Alexandria [name] MRN: [id number] DOB: 03-Dec-1940 Today's Date: 06/16/2017    History of Present Illness Pt is a 77 y/o female s/p T11 kyphoplasty secondary to T11 compression fx. PMH includes L TKA, ORIF of R ankle fx, R breast cancer, COPD, HTN, PVD, and CVA.    Clinical Impression   Patient evaluated by Occupational Therapy with no further acute OT needs identified. All education has been completed and the patient has no further questions. See below for any follow-up Occupational Therapy or equipment needs. OT to sign off. Thank you for referral.      Follow Up Recommendations  No OT follow up    Equipment Recommendations  None recommended by OT    Recommendations for Other Services       Precautions / Restrictions Precautions Precautions: Back Precaution Booklet Issued: Yes (comment) Precaution Comments: reviewed back precautions for adls and limited lifting Restrictions Weight Bearing Restrictions: No      Mobility Bed Mobility               General bed mobility comments: in chair on arrival. pt has bed rail on R side of bed from previous OT in the home s/p knee surg. pt requesting how to get another one. OT looking at [facility] options ( pt has [place] prime[place] and locating a rail that works for the patient. pt states " that one looks very close to my other one" Name and photo printed and provided to the patient for her to purchase with Daughter in law (A) at home  Transfers Overall transfer level: Modified independent Equipment used: None Transfers: Sit to/from Stand           General transfer comment: Pt demonstrated proper hand placement on seated surface for safety. No assist required     Balance Overall balance assessment: Needs assistance Sitting-balance support: No upper extremity supported;Feet supported Sitting balance-Leahy Scale: Good     Standing balance support: Single extremity supported;During  functional activity Standing balance-Leahy Scale: Poor Standing balance comment: Reliant on UE and external support for standing.                            ADL either performed or assessed with clinical judgement   ADL Overall ADL's : Modified independent                                       General ADL Comments: pt has a Production assistant, radio coming to help her, house keeper to clean the house and vet assistance for the animals for 2 weeks. pt  reports one dog due to age having incontinence and need for floor cleaning and need for breathing treatments. pt advised to incr assistance for longer than 2 weeks and discuss in depth with MD regarding restrcitions of back precautions. pt able to cross bil LE   Pt does have two different reachers at home. Educated on reacher and funnel to help with feeding the dogs. The issues would be with dogs that requires medications. Pt will need (A) until she can reach the dogs without breaking back precautions.   Vision Baseline Vision/History: Wears glasses Wears Glasses: At all times       Perception     Praxis      Pertinent Vitals/Pain Pain Assessment: Faces Faces Pain Scale: Hurts a little bit  Pain Location: back  Pain Descriptors / Indicators: Aching;Operative site guarding Pain Intervention(s): Monitored during session;Premedicated before session;Repositioned     Hand Dominance Right   Extremity/Trunk Assessment Upper Extremity Assessment Upper Extremity Assessment: Overall WFL for tasks assessed   Lower Extremity Assessment Lower Extremity Assessment: Defer to PT evaluation   Cervical / Trunk Assessment Cervical / Trunk Assessment: Other exceptions Cervical / Trunk Exceptions: s/p kyphoplasty    Communication Communication Communication: HOH(wears hearing aides / phone with text at home)   Cognition Arousal/Alertness: Awake/alert Behavior During Therapy: WFL for tasks assessed/performed Overall Cognitive  Status: Within Functional Limits for tasks assessed                                 General Comments: Very talkative and decreased attention to task at times   General Comments       Exercises     Shoulder Instructions      Home Living Family/patient expects to be discharged to:: Private residence Living Arrangements: Alone Available Help at Discharge: Friend(s);Available PRN/intermittently Type of Home: House Home Access: Stairs to enter [employer] of Steps: 7 Entrance Stairs-Rails: Right;Left;Can reach both Home Layout: One level     Bathroom Shower/Tub: Occupational psychologist: Handicapped height     Home Equipment: Environmental consultant - 2 wheels;Walker - 4 wheels;Cane - single point;Shower seat;Grab bars - toilet;Grab bars - tub/shower   Additional Comments: patient has 3 dogs and 1 cat in the home. pt has extensive care needed for the 72 yo dog that requires crating and breathing treatments. pt requires medication for multiple animals and need to help them take the medicatoin. pt has plans for an income Warehouse manager for 2 weeks. Pt advised to discuss need for possibly 4-6 weeks of assistance.       Prior Functioning/Environment Level of Independence: Independent with assistive device(s)        Comments: Used RW vs cane for ambulation         OT Problem List:        OT Treatment/Interventions:      OT Goals(Current goals can be found in the care plan section) Acute Rehab OT Goals Patient Stated Goal: to go home today  OT Frequency:     Barriers to D/C:            Co-evaluation              AM-PAC PT "6 Clicks" Daily Activity     Outcome Measure Help from another person eating meals?: None Help from another person taking care of personal grooming?: None Help from another person toileting, which includes using toliet, bedpan, or urinal?: None Help from another person bathing (including washing, rinsing, drying)?: None Help  from another person to put on and taking off regular upper body clothing?: None Help from another person to put on and taking off regular lower body clothing?: None 6 Click Score: 24   End of Session Nurse Communication: Mobility status;Precautions  Activity Tolerance: Patient tolerated treatment well Patient left: in chair;with call bell/phone within reach  OT Visit Diagnosis: Unsteadiness on feet (R26.81)                Time: 8413-2440 OT Time Calculation (min): 27 min Charges:  OT General Charges $OT Visit: 1 Visit OT Evaluation $OT Eval Moderate Complexity: 1 Mod G-Codes:      Jeri Modena   OTR/L Pager: [phone]  Office: [phone] .   Parke Poisson B 06/16/2017, 9:07 AM

## 2017-06-16 NOTE — Progress Notes (Signed)
    Subjective: Procedure(s) (LRB): KYPHOPLASTY T11 (N/A) 1 Day Post-Op  Patient reports pain as 0 on 0-10 scale.  Reports none leg pain denies incisional back pain   Positive void Negative bowel movement Positive flatus Negative chest pain or shortness of breath  Objective: Vital signs in last 24 hours: Temp:  [97.6 F (36.4 C)-99 F (37.2 C)] 98.4 F (36.9 C) (04/05 0727) Pulse Rate:  [74-93] 74 (04/05 0727) Resp:  [12-20] 18 (04/05 0727) BP: (110-144)/(49-70) 127/67 (04/05 0727) SpO2:  [93 %-100 %] 99 % (04/05 0727)  Intake/Output from previous day: 04/04 0701 - 04/05 0700 In: 600 [I.V.:600] Out: 5 [Blood:5]  Labs: No results for input(s): WBC, RBC, HCT, PLT in the last 72 hours. No results for input(s): NA, K, CL, CO2, BUN, CREATININE, GLUCOSE, CALCIUM in the last 72 hours. No results for input(s): LABPT, INR in the last 72 hours.  Physical Exam: Neurologically intact Intact pulses distally Incision: dressing C/D/I Compartment soft There is no height or weight on file to calculate BMI.   Assessment/Plan: Patient stable  xrays n/a Continue mobilization with physical therapy Continue care  Advance diet Up with therapy  Doing well - ok for d/c to home F/u in 2 weeks  Melina Schools, MD [place] [phone]

## 2017-06-16 NOTE — Progress Notes (Signed)
Physical Therapy Treatment Patient Details Name: Alexandria [name] MRN: [id number] DOB: 1940/10/28 Today's Date: 06/16/2017    History of Present Illness Pt is a 77 y/o female s/p T11 kyphoplasty secondary to T11 compression fx. PMH includes L TKA, ORIF of R ankle fx, R breast cancer, COPD, HTN, PVD, and CVA.     PT Comments    Pt progressing towards physical therapy goals. Reports that she will be alone at home but will have help coming in to assist with laundry and cleaning tasks. Education difficult at times as pt easily distracted and quickly moves from one topic to another. Was able to increase ambulation distance in hall and complete stair training. Will continue to follow and progress as able per POC.    Follow Up Recommendations  Home health PT;Supervision for mobility/OOB(for safety eval )     Equipment Recommendations  None recommended by PT    Recommendations for Other Services       Precautions / Restrictions Precautions Precautions: Back Precaution Booklet Issued: Yes (comment) Precaution Comments: Reviewed back precautions with pt.  Restrictions Weight Bearing Restrictions: No    Mobility  Bed Mobility               General bed mobility comments: Pt sitting up in recliner when PT arrived.   Transfers Overall transfer level: Modified independent Equipment used: None Transfers: Sit to/from Stand           General transfer comment: Pt demonstrated proper hand placement on seated surface for safety. No assist required   Ambulation/Gait Ambulation/Gait assistance: Supervision Ambulation Distance (Feet): 350 Feet Assistive device: None Gait Pattern/deviations: Step-through pattern;Decreased stride length Gait velocity: Decreased  Gait velocity interpretation: Below normal speed for age/gender General Gait Details: Slightly unsteady at times. Pt makes transitional movements quickly and turns to look out both sides of the windows in the hall or behind her  without warning. Although she is maintaining precautions, has her UE's in high guard position and appears to be slightly unsteady at times. Educated for safety at home with ambulation and slowing down for turns.    Stairs Stairs: Yes   Stair Management: One rail Right;Step to pattern;Alternating pattern Number of Stairs: 10 General stair comments: Close supervision for safety. Pt ascended with alternating step pattern and descended with step-to pattern.   Wheelchair Mobility    Modified Rankin (Stroke Patients Only)       Balance Overall balance assessment: Needs assistance Sitting-balance support: No upper extremity supported;Feet supported Sitting balance-Leahy Scale: Good     Standing balance support: Single extremity supported;During functional activity Standing balance-Leahy Scale: Poor Standing balance comment: Reliant on UE and external support for standing.                             Cognition Arousal/Alertness: Awake/alert Behavior During Therapy: WFL for tasks assessed/performed Overall Cognitive Status: Within Functional Limits for tasks assessed                                 General Comments: Very talkative and decreased attention to task at times      Exercises      General Comments        Pertinent Vitals/Pain Pain Assessment: Faces Faces Pain Scale: Hurts a little bit Pain Location: back  Pain Descriptors / Indicators: Aching;Operative site guarding Pain Intervention(s): Monitored during session  Home Living                      Prior Function            PT Goals (current goals can now be found in the care plan section) Acute Rehab PT Goals Patient Stated Goal: to go home today PT Goal Formulation: With patient Time For Goal Achievement: 06/29/17 Potential to Achieve Goals: Good Progress towards PT goals: Progressing toward goals    Frequency    Min 5X/week      PT Plan Current plan remains  appropriate    Co-evaluation              AM-PAC PT "6 Clicks" Daily Activity  Outcome Measure  Difficulty turning over in bed (including adjusting bedclothes, sheets and blankets)?: None Difficulty moving from lying on back to sitting on the side of the bed? : A Little Difficulty sitting down on and standing up from a chair with arms (e.g., wheelchair, bedside commode, etc,.)?: A Little Help needed moving to and from a bed to chair (including a wheelchair)?: A Little Help needed walking in hospital room?: A Little Help needed climbing 3-5 steps with a railing? : A Little 6 Click Score: 19    End of Session Equipment Utilized During Treatment: Gait belt Activity Tolerance: Patient tolerated treatment well Patient left: Other (comment)(In room with RT) Nurse Communication: Mobility status PT Visit Diagnosis: Other abnormalities of gait and mobility (R26.89);Pain;Unsteadiness on feet (R26.81) Pain - part of body: (back )     Time: 1443-1540 PT Time Calculation (min) (ACUTE ONLY): 18 min  Charges:  $Gait Training: 8-22 mins                    G Codes:       Rolinda Roan, PT, DPT Acute Rehabilitation Services Pager: [phone]    Thelma Comp 06/16/2017, 8:50 AM

## 2017-06-16 NOTE — Progress Notes (Signed)
Patient alert and oriented, mae's well, voiding adequate amount of urine, swallowing without difficulty, no c/o pain at time of discharge. Patient discharged home with family. Script and discharged instructions given to patient. Patient and family stated understanding of instructions given. Patient has an appointment with Dr. Brooks  

## 2017-06-20 NOTE — Discharge Summary (Signed)
Physician Discharge Summary  Patient ID: Alexandria [name] MRN: [id number] DOB/AGE: 1940-03-27 77 y.o.  Admit date: 06/15/2017 Discharge date: 06/16/17  Admission Diagnoses:  Compression Fracture of T11  Discharge Diagnoses:  Active Problems:   S/P kyphoplasty   Past Medical History:  Diagnosis Date  . Allergy   . Anemia    hx of   . Anxiety   . Arthritis    left knee, hands, back  . Asthma    daily and prn inhalers  . 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 problems with balance  . Breast cancer ([place]) 12/2011   right, ER+, PR -, Her 2 -  . COPD (chronic obstructive pulmonary disease) ([place])   . Degenerative joint disease of low back   . Depression   . Dysrhythmia    atrial fib   . GERD (gastroesophageal reflux disease)   . Hearing loss    bilateral hearing aids  . 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 History of kidney stones   . Hx of radiation therapy 03/12/12 -04/13/12   right breast  . Hyperlipidemia   . Hypertension    under control, has been on med. since age 15  . IBS (irritable bowel syndrome)   . Long term current use of aromatase inhibitor 05/08/2012  . Overactive bladder   . Palpitations   . Pneumonia    hx of x 2   . PONV (postoperative nausea and vomiting)   . PVD (peripheral vascular disease) (HCC)    varicose veins - left worse than right  . Spinal stenosis   . Stress incontinence   . Stroke [facility])    problem with expressive communicaiton  . Traumatic hemopneumothorax 1982   left    Surgeries: Procedure(s): KYPHOPLASTY T11 on 06/15/2017   Consultants (if any):   Discharged Condition: Improved  Hospital Course: Alexandria [name] is an 77 y.o. female who was admitted 06/15/2017 with a diagnosis of Compression Fracture of T11 and went to the operating room on 06/15/2017 and underwent the above named procedures.  Post op day 1 pt reports no pain.  Pt denies incisional pain.  Pt is  voiding w/o difficulty. Pt is ambulating in the hallway.  Pt is cleared by PT for DC.  She was given perioperative antibiotics:  Anti-infectives (From admission, onward)   Start     Dose/Rate Route Frequency Ordered Stop   06/15/17 2200  vancomycin (VANCOCIN) 1,250 mg in sodium chloride 0.9 % 250 mL IVPB     1,250 mg 166.7 mL/hr over 90 Minutes Intravenous  Once 06/15/17 1216 06/15/17 2309   06/15/17 0608  vancomycin (VANCOCIN) IVPB 1000 mg/200 mL premix     1,000 mg 200 mL/hr over 60 Minutes Intravenous 60 min pre-op 06/15/17 0608 06/15/17 0732    .  She was given sequential compression devices, early ambulation, and TED for DVT prophylaxis.  She benefited maximally from the hospital stay and there were no complications.    Recent vital signs:  Vitals:   06/16/17 0722 06/16/17 0727  BP: (!) 125/49 127/67  Pulse: 74 74  Resp: 18 18  Temp: 98.3 F (36.8 C) 98.4 F (36.9 C)  SpO2: 98% 99%    Recent laboratory studies:  Lab Results  Component Value Date   HGB 12.3 06/09/2017   HGB 12.8 05/17/2017   HGB 12.7 11/29/2016   Lab Results  Component Value Date   WBC 6.3  06/09/2017   PLT 209 06/09/2017   No results found for: INR Lab Results  Component Value Date   NA 139 06/09/2017   K 3.3 (L) 06/09/2017   CL 103 06/09/2017   CO2 27 06/09/2017   BUN 15 06/09/2017   CREATININE 0.83 06/09/2017   GLUCOSE 101 (H) 06/09/2017    Discharge Medications:   Allergies as of 06/16/2017      Reactions   Augmentin [amoxicillin-pot Clavulanate] Itching, Rash, Other (See Comments)   PT DEVELOPED SEVERE RASH INVOLVING MUCUS MEMBRANES or SKIN NECROSIS WITH PENICILLINS: #  #  #  YES  #  #  #     Penicillins Hives, Other (See Comments)   Has patient had a PCN reaction causing immediate rash, facial/tongue/throat swelling, SOB or lightheadedness with hypotension: No HAS PT DEVELOPED SEVERE RASH INVOLVING MUCUS MEMBRANES or SKIN NECROSIS: #  #  #  YES  #  #  #   Has patient had a PCN  reaction that required hospitalization: No Has patient had a PCN reaction occurring within the last 10 years: No   Aspirin Hives   Chlorhexidine Gluconate Rash   Chocolate Hives, Other (See Comments)   RUNNY NOSE   Coffee Bean Extract Hives, Other (See Comments)   RUNNY NOSE   Ibuprofen Hives, Other (See Comments)   RUNNY NOSE   Oxycodone Hcl Hives, Nausea And Vomiting   Shrimp [shellfish Allergy] Hives, Other (See Comments)   RUNNY NOSE   Sulfonamide Derivatives Hives      Medication List    STOP taking these medications   acetaminophen 650 MG CR tablet Commonly known as:  TYLENOL   methocarbamol 500 MG tablet Commonly known as:  ROBAXIN     TAKE these medications   ADVAIR DISKUS 100-50 MCG/DOSE Aepb Generic drug:  Fluticasone-Salmeterol INHALE 1 PUFF TWICE DAILY AT 10 AM AND 5 PM.   albuterol 108 (90 Base) MCG/ACT inhaler Commonly known as:  VENTOLIN HFA INHALE 2 PUFFS INTO THE LUNGS EVERY 6 HOURS AS NEEDED FOR SHORTNESS OF BREATH AND WHEEZING.   ALIGN PO Take 1 tablet by mouth daily.   amLODipine 10 MG tablet Commonly known as:  NORVASC Take 1 tablet (10 mg total) by mouth daily.   ARNICA EX Apply 1 application topically daily as needed (pain).   ASPERCREME EX Apply 1 application topically daily as needed (pain).   atorvastatin 20 MG tablet Commonly known as:  LIPITOR TAKE (1) TABLET TWO TIMES A WEEK.   b complex vitamins tablet Take 1 tablet by mouth 2 (two) times daily. PLUS FOLIC ACID PLUS VIT. C   Biotin 5000 MCG Caps Take 5,000 mcg by mouth daily.   Capsaicin 0.025 % Pads Apply 1 application topically daily as needed (pain).   clopidogrel 75 MG tablet Commonly known as:  PLAVIX Take 1 tablet (75 mg total) by mouth daily.   CO-ENZYME Q10-VITAMIN E PO Take 1 tablet by mouth daily.   fluticasone 50 MCG/ACT nasal spray Commonly known as:  FLONASE Place 2 sprays into both nostrils daily. What changed:    when to take this  reasons to take  this   hydrochlorothiazide 12.5 MG capsule Commonly known as:  MICROZIDE TAKE 1 CAPSULE IN THE MORNING AS NEEDED. What changed:  See the new instructions.   HYDROcodone-acetaminophen 5-325 MG tablet Commonly known as:  NORCO Take 1 tablet by mouth every 6 (six) hours as needed for up to 5 days for moderate pain.   loratadine 10  MG tablet Commonly known as:  CLARITIN Take 10 mg by mouth daily as needed for allergies.   Menthol (Topical Analgesic) 16 % Gel Apply 1 application topically daily as needed (pain).   BIOFREEZE ROLL-ON EX Apply 1 application topically daily as needed (pain).   ondansetron 4 MG disintegrating tablet Commonly known as:  ZOFRAN ODT Take 1 tablet (4 mg total) by mouth every 8 (eight) hours as needed for nausea or vomiting.   OSTEO BI-FLEX ADV TRIPLE ST PO Take 1 tablet by mouth daily.   pantoprazole 40 MG tablet Commonly known as:  PROTONIX TAKE 1 TABLET BY MOUTH DAILY.   potassium chloride SA 20 MEQ tablet Commonly known as:  K-DUR,KLOR-CON TAKE 3 TABLETS DAILY. What changed:    how much to take  how to take this  when to take this   ranitidine 150 MG tablet Commonly known as:  ZANTAC Take 150 mg by mouth at bedtime as needed for heartburn.   Selenium 200 MCG Caps Take 200 mcg by mouth 2 (two) times daily.   SYSTANE OP Place 1 drop into both eyes daily as needed. Dry eyes   THERMACARE NECK/WRIST Misc Apply 1 patch topically daily as needed (pain).   traMADol 50 MG tablet Commonly known as:  ULTRAM Take 1 tablet (50 mg total) by mouth every 6 (six) hours as needed. What changed:  reasons to take this   Vitamin D-3 5000 units Tabs Take 5,000 Units by mouth daily.   ZOFRAN 4 MG tablet Generic drug:  ondansetron Take 4 mg by mouth every 8 (eight) hours as needed for nausea or vomiting.     ASK your doctor about these medications   escitalopram 10 MG tablet Commonly known as:  LEXAPRO TAKE 1/2 TO 1 TABLET AT BEDTIME.        Diagnostic Studies: Dg Thoracolumabar Spine  Result Date: 06/15/2017 CLINICAL DATA:  T11 kyphoplasty. EXAM: DG C-ARM 61-120 MIN; THORACOLUMBAR SPINE - 2 VIEW Fluoroscopy time: 1 minutes 2 seconds. COMPARISON:  Radiographs of November 07, 2016. FINDINGS: Two intraoperative fluoroscopic images of the lower thoracic spine demonstrate the patient be status post kyphoplasty of T11 vertebral body. IMPRESSION: Status post T11 kyphoplasty. Electronically Signed   By: Marijo Conception, M.D.   On: 06/15/2017 09:31   Dg C-arm 1-60 Min  Result Date: 06/15/2017 CLINICAL DATA:  T11 kyphoplasty. EXAM: DG C-ARM 61-120 MIN; THORACOLUMBAR SPINE - 2 VIEW Fluoroscopy time: 1 minutes 2 seconds. COMPARISON:  Radiographs of November 07, 2016. FINDINGS: Two intraoperative fluoroscopic images of the lower thoracic spine demonstrate the patient be status post kyphoplasty of T11 vertebral body. IMPRESSION: Status post T11 kyphoplasty. Electronically Signed   By: Marijo Conception, M.D.   On: 06/15/2017 09:31    Disposition:  Pt will present to clinic in 2 weeks Post op medication provided Discharge Instructions    Incentive spirometry RT   Complete by:  As directed       Follow-up Information    Melina Schools, MD Follow up in 2 week(s).   Specialty:  Orthopedic Surgery Contact information: [address] [place] [address] [phone]            Signed: Valinda Hoar 06/20/2017, 2:26 PM

## 2017-06-27 ENCOUNTER — Other Ambulatory Visit: Payer: Self-pay | Admitting: Family Medicine

## 2017-06-27 DIAGNOSIS — I10 Essential (primary) hypertension: Secondary | ICD-10-CM

## 2017-07-10 ENCOUNTER — Other Ambulatory Visit: Payer: Self-pay | Admitting: Adult Health

## 2017-07-10 DIAGNOSIS — I10 Essential (primary) hypertension: Secondary | ICD-10-CM

## 2017-07-10 NOTE — Telephone Encounter (Signed)
Sent to the pharmacy by e-scribe. 

## 2017-07-27 ENCOUNTER — Ambulatory Visit (INDEPENDENT_AMBULATORY_CARE_PROVIDER_SITE_OTHER): Payer: Medicare Other | Admitting: Endocrinology

## 2017-07-27 ENCOUNTER — Encounter: Payer: Self-pay | Admitting: Endocrinology

## 2017-07-27 VITALS — BP 132/74 | HR 77 | Ht 63.5 in | Wt 144.6 lb

## 2017-07-27 DIAGNOSIS — N200 Calculus of kidney: Secondary | ICD-10-CM

## 2017-07-27 DIAGNOSIS — M85851 Other specified disorders of bone density and structure, right thigh: Secondary | ICD-10-CM | POA: Diagnosis not present

## 2017-07-27 DIAGNOSIS — M85852 Other specified disorders of bone density and structure, left thigh: Secondary | ICD-10-CM

## 2017-07-27 NOTE — Progress Notes (Signed)
Patient ID: Alexandria [name], female   DOB: 1940/10/18, 77 y.o.   MRN: [id number]          Referring PCP: [name], NP   Chief complaint: Low back pain  History of Present Illness:  The patient is referred here for osteoporosis management.  Patient had a fall on her concrete driveway in August 7858 and was found to have a partial compression of the T11 vertebra Because of continued pain she was treated with kyphoplasty on 06/15/2017  She had a screening bone density done in 12/2016 and this showed the following T-scores; comparison was done with bone density from 2009:  Results:  Lumbar spine L1-L4 Femoral neck (FN)  T-score +1.3 RFN: -1.2 LFN: -1.5  Change in BMD from previous DXA test (%) -7.5%* -13.2%*    She has had some height loss, not clear how much She had been on treatment with Arimidex for her breast cancer for 5 years unti 04/2017 She does not take steroids chronically and has only a couple of doses of Medrol Dosepak a year for her asthma  Since she is going to have fusion of her cervical vertebrae done erect electively her neurosurgeon has referred her for management of her osteopenia in preparation for the procedure and to allow better healing At this time she has only mild low back pain  Age at menopause:56  Previous treatment: Alendronate 70 mg weekly in 2014 for a year, not clear why this was stopped, no previous HRT Calcium supplements: None, does have increased dairy intake.  Stopped calcium supplement because of kidney stones  Vitamin D supplements: Vitamin D3 daily, 5000 units     LABS:  Lab Results  Component Value Date   VD25OH 54.31 11/29/2016     Past Medical History:  Diagnosis Date  . Allergy   . Anemia    hx of   . Anxiety   . Arthritis    left knee, hands, back  . Asthma    daily and prn inhalers  . 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 problems with balance  . Breast cancer ([place]) 12/2011   right, ER+, PR -, Her 2 -  . COPD (chronic obstructive pulmonary disease) ([place])   . Degenerative joint disease of low back   . Depression   . Dysrhythmia    atrial fib   . GERD (gastroesophageal reflux disease)   . Hearing loss    bilateral hearing aids  . 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 History of kidney stones   . Hx of radiation therapy 03/12/12 -04/13/12   right breast  . Hyperlipidemia   . Hypertension    under control, has been on med. since age 39  . IBS (irritable bowel syndrome)   . Long term current use of aromatase inhibitor 05/08/2012  . Overactive bladder   . Palpitations   . Pneumonia    hx of x 2   . PONV (postoperative nausea and vomiting)   . PVD (peripheral vascular disease) (H[facility])    varicose veins - left worse than right  . Spinal stenosis   . Stress incontinence   . Stroke [facility])    problem with expressive communicaiton  . Traumatic hemopneumothorax 1982   left    Past Surgical History:  Procedure Laterality Date  . ANKLE HARDWARE REMOVAL     right  . BREAST BIOPSY  1976   right  .  BREAST LUMPECTOMY  2013   right with snbx  . CHOLECYSTECTOMY  1990s  . COLONOSCOPY    . CYSTOSCOPY  07/05/2005  . KNEE SURGERY  1980s   left  . KYPHOPLASTY N/A 06/15/2017   Procedure: KYPHOPLASTY T11;  Surgeon: Melina Schools, MD;  Location: [facility];  Service: Orthopedics;  Laterality: N/A;  90 mins  . ORIF ANKLE FRACTURE  1980s   right   . TOTAL KNEE ARTHROPLASTY Left 12/08/2015   Procedure: LEFT TOTAL KNEE ARTHROPLASTY;  Surgeon: Paralee Cancel, MD;  Location: [facility];  Service: Orthopedics;  Laterality: Left;  . TRANSOBTURATOR SLING  07/05/2005  . TUBAL LIGATION  1976  . VEIN SURGERY     vein ablation left leg    Family History  Problem Relation Age of Onset  . Cancer Maternal Aunt        breast  . Cancer Paternal Aunt        Multiple Myeloma  . Stomach cancer Paternal Uncle   . Breast cancer Paternal Uncle   . Breast  cancer Cousin        early 63s  . Breast cancer Paternal Aunt        late 66s-60s  . Stroke Mother   . Heart attack Father        Died age 80  . Heart attack Paternal Grandfather   . Breast cancer Paternal Aunt        bilateral breast cancer  . Dementia Paternal Aunt   . Congestive Heart Failure Paternal Uncle        Ischemic  . Congestive Heart Failure Paternal Uncle        Ischemic    Social History:  reports that she quit smoking about 23 years ago. Her smoking use included cigarettes. She has a 7.50 pack-year smoking history. She has never used smokeless tobacco. She reports that she does not drink alcohol or use drugs.  Allergies:  Allergies  Allergen Reactions  . Augmentin [Amoxicillin-Pot Clavulanate] Itching, Rash and Other (See Comments)    PT DEVELOPED SEVERE RASH INVOLVING MUCUS MEMBRANES or SKIN NECROSIS WITH PENICILLINS: #  #  #  YES  #  #  #    . Penicillins Hives and Other (See Comments)    Has patient had a PCN reaction causing immediate rash, facial/tongue/throat swelling, SOB or lightheadedness with hypotension: No HAS PT DEVELOPED SEVERE RASH INVOLVING MUCUS MEMBRANES or SKIN NECROSIS: #  #  #  YES  #  #  #   Has patient had a PCN reaction that required hospitalization: No Has patient had a PCN reaction occurring within the last 10 years: No   . Aspirin Hives  . Chlorhexidine Gluconate Rash  . Chocolate Hives and Other (See Comments)    RUNNY NOSE   . Coffee Bean Extract Hives and Other (See Comments)    RUNNY NOSE   . Ibuprofen Hives and Other (See Comments)    RUNNY NOSE  . Oxycodone Hcl Hives and Nausea And Vomiting  . Shrimp [Shellfish Allergy] Hives and Other (See Comments)    RUNNY NOSE   . Sulfonamide Derivatives Hives    Allergies as of 07/27/2017      Reactions   Augmentin [amoxicillin-pot Clavulanate] Itching, Rash, Other (See Comments)   PT DEVELOPED SEVERE RASH INVOLVING MUCUS MEMBRANES or SKIN NECROSIS WITH PENICILLINS: #  #  #  YES  #   #  #     Penicillins Hives, Other (See Comments)  Has patient had a PCN reaction causing immediate rash, facial/tongue/throat swelling, SOB or lightheadedness with hypotension: No HAS PT DEVELOPED SEVERE RASH INVOLVING MUCUS MEMBRANES or SKIN NECROSIS: #  #  #  YES  #  #  #   Has patient had a PCN reaction that required hospitalization: No Has patient had a PCN reaction occurring within the last 10 years: No   Aspirin Hives   Chlorhexidine Gluconate Rash   Chocolate Hives, Other (See Comments)   RUNNY NOSE   Coffee Bean Extract Hives, Other (See Comments)   RUNNY NOSE   Ibuprofen Hives, Other (See Comments)   RUNNY NOSE   Oxycodone Hcl Hives, Nausea And Vomiting   Shrimp [shellfish Allergy] Hives, Other (See Comments)   RUNNY NOSE   Sulfonamide Derivatives Hives      Medication List        Accurate as of 07/27/17  4:10 PM. Always use your most recent med list.          ADVAIR DISKUS 100-50 MCG/DOSE Aepb Generic drug:  Fluticasone-Salmeterol INHALE 1 PUFF TWICE DAILY AT 10 AM AND 5 PM.   albuterol 108 (90 Base) MCG/ACT inhaler Commonly known as:  VENTOLIN HFA INHALE 2 PUFFS INTO THE LUNGS EVERY 6 HOURS AS NEEDED FOR SHORTNESS OF BREATH AND WHEEZING.   ALIGN PO Take 1 tablet by mouth daily.   amLODipine 10 MG tablet Commonly known as:  NORVASC TAKE 1 TABLET BY MOUTH DAILY.   ARNICA EX Apply 1 application topically daily as needed (pain).   ASPERCREME EX Apply 1 application topically daily as needed (pain).   atorvastatin 20 MG tablet Commonly known as:  LIPITOR TAKE (1) TABLET TWO TIMES A WEEK.   b complex vitamins tablet Take 1 tablet by mouth 2 (two) times daily. PLUS FOLIC ACID PLUS VIT. C   Biotin 5000 MCG Caps Take 5,000 mcg by mouth daily.   Capsaicin 0.025 % Pads Apply 1 application topically daily as needed (pain).   clopidogrel 75 MG tablet Commonly known as:  PLAVIX Take 1 tablet (75 mg total) by mouth daily.   CO-ENZYME Q10-VITAMIN E PO Take  1 tablet by mouth daily.   escitalopram 10 MG tablet Commonly known as:  LEXAPRO TAKE 1/2 TO 1 TABLET AT BEDTIME.   hydrochlorothiazide 12.5 MG capsule Commonly known as:  MICROZIDE TAKE 1 CAPSULE IN THE MORNING AS NEEDED.   loratadine 10 MG tablet Commonly known as:  CLARITIN Take 10 mg by mouth daily as needed for allergies.   Menthol (Topical Analgesic) 16 % Gel Apply 1 application topically daily as needed (pain).   BIOFREEZE ROLL-ON EX Apply 1 application topically daily as needed (pain).   ondansetron 4 MG disintegrating tablet Commonly known as:  ZOFRAN ODT Take 1 tablet (4 mg total) by mouth every 8 (eight) hours as needed for nausea or vomiting.   OSTEO BI-FLEX ADV TRIPLE ST PO Take 1 tablet by mouth daily.   pantoprazole 40 MG tablet Commonly known as:  PROTONIX TAKE 1 TABLET BY MOUTH DAILY.   potassium chloride SA 20 MEQ tablet Commonly known as:  K-DUR,KLOR-CON TAKE 3 TABLETS DAILY.   ranitidine 150 MG tablet Commonly known as:  ZANTAC Take 150 mg by mouth at bedtime as needed for heartburn.   Selenium 200 MCG Caps Take 200 mcg by mouth 2 (two) times daily.   SYSTANE OP Place 1 drop into both eyes daily as needed. Dry eyes   THERMACARE NECK/WRIST Misc Apply 1 patch topically  daily as needed (pain).   traMADol 50 MG tablet Commonly known as:  ULTRAM Take 1 tablet (50 mg total) by mouth every 6 (six) hours as needed.   Vitamin D-3 5000 units Tabs Take 5,000 Units by mouth daily.   ZOFRAN 4 MG tablet Generic drug:  ondansetron Take 4 mg by mouth every 8 (eight) hours as needed for nausea or vomiting.        Review of Systems  Constitutional: Negative for weight loss and reduced appetite.  HENT: Negative for headaches.   Eyes: Negative for visual disturbance.  Respiratory:       No shortness of breath at rest, has known COPD  Cardiovascular: Negative for leg swelling.  Gastrointestinal: Positive for diarrhea.       She has periodic  diarrhea triggered off by certain factors and fluids from her long-standing IBS  Endocrine: Negative for fatigue.  Musculoskeletal: Positive for joint pain and back pain.  Neurological: Negative for weakness.  Psychiatric/Behavioral: Negative for depressed mood.     LABS:  No visits with results within 1 Week(s) from this visit.  Latest known visit with results is:  Hospital Outpatient Visit on 06/09/2017  Component Date Value Ref Range Status  . MRSA, PCR 06/09/2017 NEGATIVE  NEGATIVE Final  . Staphylococcus aureus 06/09/2017 NEGATIVE  NEGATIVE Final   Comment: (NOTE) The Xpert SA Assay (FDA approved for NASAL specimens in patients 31 years of age and older), is one component of a comprehensive surveillance program. It is not intended to diagnose infection nor to guide or monitor treatment. Performed at [facility], [place] [address], [place], [place] [address]   . Sodium 06/09/2017 139  135 - 145 mmol/L Final  . Potassium 06/09/2017 3.3* 3.5 - 5.1 mmol/L Final  . Chloride 06/09/2017 103  101 - 111 mmol/L Final  . CO2 06/09/2017 27  22 - 32 mmol/L Final  . Glucose, Bld 06/09/2017 101* 65 - 99 mg/dL Final  . BUN 06/09/2017 15  6 - 20 mg/dL Final  . Creatinine, Ser 06/09/2017 0.83  0.44 - 1.00 mg/dL Final  . Calcium 06/09/2017 9.5  8.9 - 10.3 mg/dL Final  . GFR calc non Af Amer 06/09/2017 >60  >60 mL/min Final  . GFR calc Af Amer 06/09/2017 >60  >60 mL/min Final   Comment: (NOTE) The eGFR has been calculated using the CKD EPI equation. This calculation has not been validated in all clinical situations. eGFR's persistently <60 mL/min signify possible Chronic Kidney Disease.   Georgiann Hahn gap 06/09/2017 9  5 - 15 Final   Performed at [facility], [place] [address] [place], [place] [address]  . WBC 06/09/2017 6.3  4.0 - 10.5 K/uL Final  . RBC 06/09/2017 4.25  3.87 - 5.11 MIL/uL Final  . Hemoglobin 06/09/2017 12.3  12.0 - 15.0 g/dL Final  . HCT 06/09/2017 37.6  36.0 -  46.0 % Final  . MCV 06/09/2017 88.5  78.0 - 100.0 fL Final  . MCH 06/09/2017 28.9  26.0 - 34.0 pg Final  . MCHC 06/09/2017 32.7  30.0 - 36.0 g/dL Final  . RDW 06/09/2017 13.3  11.5 - 15.5 % Final  . Platelets 06/09/2017 209  150 - 400 K/uL Final   Performed at [facility], [place] [address] [place], [place] [address]     PHYSICAL EXAM:  BP 132/74 (BP Location: Left Arm, Patient Position: Sitting, Cuff Size: Normal)   Pulse 77   Ht 5' 3.5" (1.613  m)   Wt 144 lb 9.6 oz (65.6 kg)   SpO2 97%   BMI 25.21 kg/m   GENERAL: Averagely built and nourished   No pallor, clubbing, lymphadenopathy or edema.    Skin:  no rash or skin lesions.  EYES:  Externally normal.    ENT: Oral mucosa and tongue normal.  THYROID:  Not palpable.  HEART:  Normal  S1 and S2; no murmur or click.  CHEST:  Normal shape.  Lungs: Vescicular breath sounds heard equally.  No crepitations/ wheeze.  ABDOMEN:  No distention.  Liver and spleen not palpable.  No other mass or tenderness.  JOINTS:  Normal.  SPINE: No significant kyphosis.  No spinal tenderness   NEUROLOGICAL: .Reflexes are normal bilaterally at biceps.   ASSESSMENT:   OSTEOPENIA at the hip, some progression as shown by her bone density in 10/18 She has had treatment with Arimidex for 5 years until recently making her more prone to osteoporosis  Also has had a vertebral fracture after a fall indicating that she is at relatively high risk for fracture She is also going to be having cervical vertebral body fusion as a elective procedure which would require improvement in her bone architect ure and improved ability to heal with the surgical procedure  For treatment currently the patient is only on vitamin D with adequate levels recently  PLAN:  Since she may not be able to get approval for a medication like Prolia she is a good candidate for RECLAST which would be more predictably effective than alendronate Discussed how this works,  given patient information handout and she is agreeable to considering this We will also need to rule out other causes of osteopenia with serum protein electrophoresis, also needs complete chemistry profile. We will also check PTH level because of her history of nephrolithiasis to rule out normocalcemic hyperparathyroidism  Since her vitamin D level is therapeutic she can continue this  Once her labs are available she will be scheduled for Reclast infusion and she can electively schedule her cervical fusion surgery  Consultation note sent to referring physician   Elayne Snare 07/27/2017, 4:10 PM

## 2017-07-28 LAB — COMPREHENSIVE METABOLIC PANEL
ALK PHOS: 67 U/L (ref 39–117)
ALT: 13 U/L (ref 0–35)
AST: 19 U/L (ref 0–37)
Albumin: 4.5 g/dL (ref 3.5–5.2)
BUN: 17 mg/dL (ref 6–23)
CHLORIDE: 103 meq/L (ref 96–112)
CO2: 27 mEq/L (ref 19–32)
Calcium: 9.6 mg/dL (ref 8.4–10.5)
Creatinine, Ser: 0.93 mg/dL (ref 0.40–1.20)
GFR: 62.52 mL/min (ref >60.00)
GLUCOSE: 87 mg/dL (ref 70–99)
POTASSIUM: 4.4 meq/L (ref 3.5–5.1)
SODIUM: 139 meq/L (ref 135–145)
Total Bilirubin: 1 mg/dL (ref 0.2–1.2)
Total Protein: 7.4 g/dL (ref 6.0–8.3)

## 2017-07-28 LAB — SPECIMEN STATUS

## 2017-07-28 LAB — SPECIMEN STATUS REPORT

## 2017-07-31 ENCOUNTER — Other Ambulatory Visit: Payer: Self-pay

## 2017-07-31 ENCOUNTER — Other Ambulatory Visit: Payer: Self-pay | Admitting: Endocrinology

## 2017-07-31 DIAGNOSIS — M85851 Other specified disorders of bone density and structure, right thigh: Secondary | ICD-10-CM

## 2017-07-31 DIAGNOSIS — N200 Calculus of kidney: Secondary | ICD-10-CM

## 2017-07-31 DIAGNOSIS — M85852 Other specified disorders of bone density and structure, left thigh: Secondary | ICD-10-CM

## 2017-07-31 LAB — PARATHYROID HORMONE, INTACT (NO CA)

## 2017-07-31 LAB — PROTEIN ELECTROPHORESIS, SERUM
A/G Ratio: 1.5 (ref 0.7–1.7)
ALBUMIN ELP: 4.2 g/dL (ref 2.9–4.4)
ALPHA 1: 0.2 g/dL (ref 0.0–0.4)
Alpha 2: 0.8 g/dL (ref 0.4–1.0)
Beta: 1 g/dL (ref 0.7–1.3)
Gamma Globulin: 0.8 g/dL (ref 0.4–1.8)
Globulin, Total: 2.8 g/dL (ref 2.2–3.9)
Total Protein: 7 g/dL (ref 6.0–8.5)

## 2017-08-01 LAB — PARATHYROID HORMONE, INTACT (NO CA): PTH: 28 pg/mL (ref 15–65)

## 2017-08-01 NOTE — Progress Notes (Signed)
Please call to let patient know that the lab results are all normal and please remind Alexandria [name] to schedule Reclast infusion

## 2017-08-02 ENCOUNTER — Telehealth: Payer: Self-pay | Admitting: Nutrition

## 2017-08-02 NOTE — Telephone Encounter (Signed)
Message left on machine to call me to schedule for a Reclast infusion.  Telephone number given.

## 2017-08-08 ENCOUNTER — Other Ambulatory Visit: Payer: Self-pay | Admitting: Adult Health

## 2017-08-08 DIAGNOSIS — E785 Hyperlipidemia, unspecified: Secondary | ICD-10-CM

## 2017-08-09 NOTE — Telephone Encounter (Signed)
Sent to the pharmacy by e-scribe. 

## 2017-09-13 ENCOUNTER — Telehealth: Payer: Self-pay | Admitting: Nutrition

## 2017-09-13 NOTE — Telephone Encounter (Signed)
Appointment scheduled for reclast infusion 09/27/17

## 2017-09-27 ENCOUNTER — Ambulatory Visit (INDEPENDENT_AMBULATORY_CARE_PROVIDER_SITE_OTHER): Payer: Medicare Other | Admitting: Endocrinology

## 2017-09-27 DIAGNOSIS — M85851 Other specified disorders of bone density and structure, right thigh: Secondary | ICD-10-CM | POA: Diagnosis not present

## 2017-09-27 DIAGNOSIS — M85852 Other specified disorders of bone density and structure, left thigh: Secondary | ICD-10-CM | POA: Diagnosis not present

## 2017-09-28 NOTE — Patient Instructions (Signed)
Drink 6 glasses of water today. Continue to take your Calicium and Vit. D as directed by Dr. Dwyane Dee

## 2017-09-28 NOTE — Progress Notes (Signed)
Per Dr. Ronnie Derby note on 07/27/17, and after patient signed to consent form, an IV was started in the patientsleft arm with a 20g needle. Normal saline was infused to determine that the IV was patent, and then 5mg  Reclast was started at2:25PM, and infused until2:50PM. She reported no symptoms of discomfort during this process. We discussed the need to take Calcium and Vit. D as directed by Dr. Dwyane Dee, and she was encourage to drink at least 6 8oz glasses today. She agreed to do this. The IV was then flushed with more normal saline and then D/Ced. The site showed no signs of redness or swelling.  Above procedure notes reviewed and approved

## 2017-10-13 ENCOUNTER — Other Ambulatory Visit: Payer: Self-pay | Admitting: Family Medicine

## 2017-10-17 NOTE — Telephone Encounter (Signed)
Sent to the pharmacy by e-scribe. 

## 2017-10-20 ENCOUNTER — Other Ambulatory Visit: Payer: Self-pay | Admitting: Adult Health

## 2017-10-20 DIAGNOSIS — K219 Gastro-esophageal reflux disease without esophagitis: Secondary | ICD-10-CM

## 2017-10-20 NOTE — Telephone Encounter (Signed)
Sent to the pharmacy by e-scribe. 

## 2017-11-10 ENCOUNTER — Other Ambulatory Visit (HOSPITAL_BASED_OUTPATIENT_CLINIC_OR_DEPARTMENT_OTHER): Payer: Self-pay | Admitting: Orthopedic Surgery

## 2017-11-10 ENCOUNTER — Ambulatory Visit (HOSPITAL_BASED_OUTPATIENT_CLINIC_OR_DEPARTMENT_OTHER)
Admission: RE | Admit: 2017-11-10 | Discharge: 2017-11-10 | Disposition: A | Discharge status: Home / Self Care | Payer: Medicare Other | Source: Ambulatory Visit | Attending: Orthopedic Surgery | Admitting: Orthopedic Surgery

## 2017-11-10 DIAGNOSIS — R600 Localized edema: Secondary | ICD-10-CM

## 2017-11-14 ENCOUNTER — Ambulatory Visit (INDEPENDENT_AMBULATORY_CARE_PROVIDER_SITE_OTHER): Payer: Medicare Other | Admitting: Adult Health

## 2017-11-14 ENCOUNTER — Encounter: Payer: Self-pay | Admitting: Adult Health

## 2017-11-14 VITALS — BP 130/66 | Temp 98.2°F | Wt 147.0 lb

## 2017-11-14 DIAGNOSIS — I8001 Phlebitis and thrombophlebitis of superficial vessels of right lower extremity: Secondary | ICD-10-CM | POA: Diagnosis not present

## 2017-11-14 DIAGNOSIS — Z23 Encounter for immunization: Secondary | ICD-10-CM

## 2017-11-14 DIAGNOSIS — R600 Localized edema: Secondary | ICD-10-CM

## 2017-11-14 NOTE — Progress Notes (Signed)
Subjective:    Patient ID: Alexandria [name], female    DOB: 09/16/1940, 77 y.o.   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  Presents to the office today for an acute issue of bilateral lower extremity swelling and pain x2 months.  Had a Doppler study done on 11/10/2017 after being seen at [facility]. Doppler study showed:   IMPRESSION: 1. No evidence of DVT within either lower extremity. 2. Examination is positive for age-indeterminate occlusive superficial thrombophlebitis within several hypertrophied superficial varicosities within the right calf. Again, there is no extension of this occlusive superficial thrombophlebitis to the deep venous system of the right lower extremity. 3. Note is made of several prominent though widely patent varicosities within the left calf which appear to be supplied by the left greater saphenous vein. There is no evidence of superficial thrombophlebitis affecting the left lower extremity.  He has been elevating her legs at home while resting, and using compression stockings.  She is already on Plavix.  She denies any improvement when elevating legs or using compression stockings.  At home she is prescribed  HCTZ 12.5 mg but has been taking 25 mg intermittently throughout the 2 months and has noticed that the lower extremity edema improves with this.  Review of Systems See HPI   Past Medical History:  Diagnosis Date  . Allergy   . Anemia    hx of   . Anxiety   . Arthritis    left knee, hands, back  . Asthma    daily and prn inhalers  . Brainstem infarct, acute ([place]) 03/2011   slight expressive aphasia, occ. problems with balance  . Breast cancer ([place]) 12/2011   right, ER+, PR -, Her 2 -  . COPD (chronic obstructive pulmonary disease) ([place])   . Degenerative joint disease of low back   . Depression   . Dysrhythmia    atrial fib   . GERD (gastroesophageal reflux disease)   . Hearing loss    bilateral hearing aids  . 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 History of kidney stones   . Hx of radiation therapy 03/12/12 -04/13/12   right breast  . Hyperlipidemia   . Hypertension    under control, has been on med. since age 4  . IBS (irritable bowel syndrome)   . Long term current use of aromatase inhibitor 05/08/2012  . Overactive bladder   . Palpitations   . Pneumonia    hx of x 2   . PONV (postoperative nausea and vomiting)   . PVD (peripheral vascular disease) ([facility])    varicose veins - left worse than right  . Spinal stenosis   . Stress incontinence   . Stroke [facility])    problem with expressive communicaiton  . Traumatic hemopneumothorax 1982   left    Social History   Socioeconomic History  . Marital status: Widowed  Spouse name: Not on file  . Number of children: Not on file  . Years of education: Not on file  . Highest education level: Not on file  Occupational History  . Not on file  Social Needs  . Financial resource strain: Not on file  . Food insecurity:    Worry: Not on file    Inability: Not on file  . Transportation needs:    Medical: Not on file    Non-medical: Not on file  Tobacco Use  . Smoking status: Former  Smoker    Packs/day: 0.50    Years: 15.00    Pack years: 7.50    Types: Cigarettes    Last attempt to quit: 08/28/1993    Years since quitting: 24.2  . Smokeless tobacco: Never Used  Substance and Sexual Activity  . Alcohol use: No    Alcohol/week: 0.0 standard drinks  . Drug use: No  . Sexual activity: Not Currently  Lifestyle  . Physical activity:    Days per week: Not on file    Minutes per session: Not on file  . Stress: Not on file  Relationships  . Social connections:    Talks on phone: Not on file    Gets together: Not on file    Attends religious service: Not on file    Active member of club or organization: Not on file    Attends meetings of clubs or organizations: Not on file    Relationship status: Not on file  . Intimate partner violence:    Fear of current or ex partner: Not on file    Emotionally abused: Not on file    Physically abused: Not on file    Forced sexual activity: Not on file  Other Topics Concern  . 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 ANKLE HARDWARE REMOVAL     right  . BREAST BIOPSY  1976   right  . BREAST LUMPECTOMY  2013   right with snbx  . CHOLECYSTECTOMY  1990s  . COLONOSCOPY    . CYSTOSCOPY  07/05/2005  . KNEE SURGERY  1980s   left  . KYPHOPLASTY N/A 06/15/2017   Procedure: KYPHOPLASTY T11;  Surgeon: Melina Schools, MD;  Location: [facility];  Service: Orthopedics;  Laterality: N/A;  90 mins  . ORIF ANKLE FRACTURE  1980s   right   . TOTAL KNEE ARTHROPLASTY Left 12/08/2015   Procedure: LEFT TOTAL KNEE ARTHROPLASTY;  Surgeon: Paralee Cancel, MD;  Location: [facility];  Service: Orthopedics;  Laterality: Left;  . TRANSOBTURATOR SLING  07/05/2005  . TUBAL LIGATION  1976  . VEIN SURGERY     vein ablation left leg    Family History  Problem Relation Age of Onset  . Cancer Maternal Aunt        breast  . Cancer Paternal  Aunt        Multiple Myeloma  . Stomach cancer Paternal Uncle   . Breast cancer Paternal Uncle   . Breast cancer Cousin        early 10s  . Breast cancer Paternal Aunt        late 34s-60s  . Stroke Mother   . Heart attack Father        Died age 24  . Heart attack Paternal Grandfather   . Breast cancer Paternal Aunt  bilateral breast cancer  . Dementia Paternal Aunt   . Congestive Heart Failure Paternal Uncle        Ischemic  . Congestive Heart Failure Paternal Uncle        Ischemic    Allergies  Allergen Reactions  . Augmentin [Amoxicillin-Pot Clavulanate] Itching, Rash and Other (See Comments)    PT DEVELOPED SEVERE RASH INVOLVING MUCUS MEMBRANES or SKIN NECROSIS WITH PENICILLINS: #  #  #  YES  #  #  #    . Penicillins Hives and Other (See Comments)    Has patient had a PCN reaction causing immediate rash, facial/tongue/throat swelling, SOB or lightheadedness with hypotension: No HAS PT DEVELOPED SEVERE RASH INVOLVING MUCUS MEMBRANES or SKIN NECROSIS: #  #  #  YES  #  #  #   Has patient had a PCN reaction that required hospitalization: No Has patient had a PCN reaction occurring within the last 10 years: No   . Aspirin Hives  . Chlorhexidine Gluconate Rash  . Chocolate Hives and Other (See Comments)    RUNNY NOSE   . Coffee Bean Extract Hives and Other (See Comments)    RUNNY NOSE   . Ibuprofen Hives and Other (See Comments)    RUNNY NOSE  . Oxycodone Hcl Hives and Nausea And Vomiting  . Shrimp [Shellfish Allergy] Hives and Other (See Comments)    RUNNY NOSE   . Sulfonamide Derivatives Hives  . Lipitor [Atorvastatin Calcium]     Leg Pain/Joint Pain/Back Pain    Current Outpatient Medications on File Prior to Visit  Medication Sig Dispense Refill  . ADVAIR DISKUS 100-50 MCG/DOSE AEPB INHALE 1 PUFF TWICE DAILY AT 10 AM AND 5 PM. 60 each 1  . albuterol (VENTOLIN HFA) 108 (90 BASE) MCG/ACT inhaler INHALE 2 PUFFS INTO THE LUNGS EVERY 6 HOURS AS NEEDED FOR  SHORTNESS OF BREATH AND WHEEZING. 18 g 3  . amLODipine (NORVASC) 10 MG tablet TAKE 1 TABLET BY MOUTH DAILY. 90 tablet 1  . ARNICA EX Apply 1 application topically daily as needed (pain).    Marland Kitchen b complex vitamins tablet Take 1 tablet by mouth 2 (two) times daily. PLUS FOLIC ACID PLUS VIT. C    . Biotin 5000 MCG CAPS Take 5,000 mcg by mouth daily.     . Capsaicin 0.025 % PADS Apply 1 application topically daily as needed (pain).    . Cholecalciferol (VITAMIN D-3) 5000 UNITS TABS Take 5,000 Units by mouth daily.     . clopidogrel (PLAVIX) 75 MG tablet Take 1 tablet (75 mg total) by mouth daily. 90 tablet 1  . CO-ENZYME Q10-VITAMIN E PO Take 1 tablet by mouth daily.    Marland Kitchen escitalopram (LEXAPRO) 10 MG tablet TAKE 1/2 TO 1 TABLET AT BEDTIME. 90 tablet 1  . Heat Wraps (THERMACARE NECK/WRIST) MISC Apply 1 patch topically daily as needed (pain).    . hydrochlorothiazide (MICROZIDE) 12.5 MG capsule TAKE 1 CAPSULE IN THE MORNING AS NEEDED. 90 capsule 0  . loratadine (CLARITIN) 10 MG tablet Take 10 mg by mouth daily as needed for allergies.     . Menthol, Topical Analgesic, (BIOFREEZE ROLL-ON EX) Apply 1 application topically daily as needed (pain).    . Menthol, Topical Analgesic, 16 % GEL Apply 1 application topically daily as needed (pain).    . Misc Natural Products (OSTEO BI-FLEX ADV TRIPLE ST PO) Take 1 tablet by mouth daily.    . ondansetron (ZOFRAN ODT) 4 MG disintegrating tablet Take 1  tablet (4 mg total) by mouth every 8 (eight) hours as needed for nausea or vomiting. 20 tablet 0  . ondansetron (ZOFRAN) 4 MG tablet Take 4 mg by mouth every 8 (eight) hours as needed for nausea or vomiting.    . pantoprazole (PROTONIX) 40 MG tablet TAKE 1 TABLET BY MOUTH DAILY. 90 tablet 0  . Polyethyl Glycol-Propyl Glycol (SYSTANE OP) Place 1 drop into both eyes daily as needed. Dry eyes    . potassium chloride SA (K-DUR,KLOR-CON) 20 MEQ tablet TAKE 3 TABLETS DAILY. (Patient taking differently: Take 20 meq in the  morning and 40 meq with supper) 270 tablet 4  . Probiotic Product (ALIGN PO) Take 1 tablet by mouth daily.     . ranitidine (ZANTAC) 150 MG tablet Take 150 mg by mouth at bedtime as needed for heartburn.    . Selenium 200 MCG CAPS Take 200 mcg by mouth 2 (two) times daily.     . traMADol (ULTRAM) 50 MG tablet Take 1 tablet (50 mg total) by mouth every 6 (six) hours as needed. (Patient taking differently: Take 50 mg by mouth every 6 (six) hours as needed for severe pain. ) 30 tablet   . Trolamine Salicylate (ASPERCREME EX) Apply 1 application topically daily as needed (pain).     No current facility-administered medications on file prior to visit.     BP 130/66   Temp 98.2 F (36.8 C) (Oral)   Wt 147 lb (66.7 kg)   BMI 25.63 kg/m       Objective:   Physical Exam  Constitutional: She appears well-developed and well-nourished. No distress.  Cardiovascular: Normal rate, regular rhythm, normal heart sounds and intact distal pulses.  Pulmonary/Chest: Effort normal and breath sounds normal.  Musculoskeletal: She exhibits edema and tenderness. She exhibits no deformity.  She has nonpitting edema bilaterally from ankles to knee area pain throughout bilateral lower extremities.  No redness or warmth noted.    No erythema along the course of a superficial vein on right lower extremity   Neurological: She is alert.  Skin: Skin is warm and dry. She is not diaphoretic.  Nursing note and vitals reviewed.     Assessment & Plan:  1. Lower extremity edema - Possible Venous insufficiency - We will trial her on taking 25 mg HCTZ daily  - Follow up in one week  2. 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 Need for prophylactic vaccination and inoculation against influenza - Flu vaccine HIGH DOSE PF (Fluzone High dose)  [name], NP

## 2017-11-15 LAB — COLOGUARD: Cologuard: NEGATIVE

## 2017-11-17 ENCOUNTER — Other Ambulatory Visit: Payer: Self-pay | Admitting: Adult Health

## 2017-11-21 ENCOUNTER — Ambulatory Visit (INDEPENDENT_AMBULATORY_CARE_PROVIDER_SITE_OTHER): Payer: Medicare Other | Admitting: Adult Health

## 2017-11-21 ENCOUNTER — Encounter: Payer: Self-pay | Admitting: Adult Health

## 2017-11-21 VITALS — BP 130/60 | Temp 99.1°F | Wt 145.0 lb

## 2017-11-21 DIAGNOSIS — R600 Localized edema: Secondary | ICD-10-CM | POA: Diagnosis not present

## 2017-11-22 ENCOUNTER — Encounter: Payer: Self-pay | Admitting: Family Medicine

## 2017-11-22 LAB — BASIC METABOLIC PANEL
BUN: 20 mg/dL (ref 6–23)
CHLORIDE: 101 meq/L (ref 96–112)
CO2: 28 mEq/L (ref 19–32)
CREATININE: 0.93 mg/dL (ref 0.40–1.20)
Calcium: 9.3 mg/dL (ref 8.4–10.5)
GFR: 62.47 mL/min (ref >60.00)
Glucose, Bld: 83 mg/dL (ref 70–99)
Potassium: 3.8 mEq/L (ref 3.5–5.1)
Sodium: 138 mEq/L (ref 135–145)

## 2017-11-22 NOTE — Progress Notes (Signed)
 Subjective:    Patient ID: Alexandria [name], female    DOB: 12/22/1940, 76 y.o.   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  She presents for one-week follow-up related to lower extremity edema.  During her last visit her dose of HCTZ was increased from 12.5 to 25 mg daily.  She reports that since increase she has had significant improvement in her lower extremity edema.  Unfortunately since the increase in medication she is also feeling more fatigued.  She continues to wear compression socks feels as though this is also helpful.  She is continuing to take her potassium supplement   Review of Systems See HPI   Past Medical History:  Diagnosis Date  . Allergy   . Anemia    hx of   . Anxiety   . Arthritis    left knee, hands, back  . Asthma    daily and prn inhalers  . 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 problems with balance  . Breast cancer ([facility]) 12/2011   right, ER+, PR -, Her 2 -  . COPD (chronic obstructive pulmonary disease) ([facility])   . Degenerative joint disease of low back   . Depression   . Dysrhythmia    atrial fib   . GERD (gastroesophageal reflux disease)   . Hearing loss    bilateral  hearing aids  . 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 History of kidney stones   . Hx of radiation therapy 03/12/12 -04/13/12   right breast  . Hyperlipidemia   . Hypertension    under control, has been on med. since age 44  . IBS (irritable bowel syndrome)   . Long term current use of aromatase inhibitor 05/08/2012  . Overactive bladder   . Palpitations   . Pneumonia    hx of x 2   . PONV (postoperative nausea and vomiting)   . PVD (peripheral vascular disease) ([facility])    varicose veins - left worse than right  . Spinal stenosis   . Stress incontinence   . Stroke ([facility])    problem with expressive communicaiton  . Traumatic hemopneumothorax 1982   left    Social History   Socioeconomic History  . Marital status: Widowed    Spouse name: Not on file  . Number of children: Not on file  . Years of education: Not on file  . Highest education level: Not on file  Occupational History  . Not on file  Social Needs  . Financial resource strain: Not on file  . Food insecurity:    Worry: Not on file    Inability: Not on file  . Transportation needs:    Medical: Not on file    Non-medical: Not on file  Tobacco Use  . Smoking status: Former Smoker    Packs/day: 0.50    Years: 15.00      Pack years: 7.50    Types: Cigarettes    Last attempt to quit: 08/28/1993    Years since quitting: 24.2  . Smokeless tobacco: Never Used  Substance and Sexual Activity  . Alcohol use: No    Alcohol/week: 0.0 standard drinks  . Drug use: No  . Sexual activity: Not Currently  Lifestyle  . Physical activity:    Days per week: Not on file    Minutes per session: Not on file  . Stress: Not on file  Relationships  . Social connections:    Talks on phone: Not on file    Gets together: Not on file    Attends religious service: Not on file    Active member of club or organization: Not on file    Attends meetings of clubs or organizations: Not on file     Relationship status: Not on file  . Intimate partner violence:    Fear of current or ex partner: Not on file    Emotionally abused: Not on file    Physically abused: Not on file    Forced sexual activity: Not on file  Other Topics Concern  . 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 ANKLE HARDWARE REMOVAL     right  . BREAST BIOPSY  1976   right  . BREAST LUMPECTOMY  2013   right with snbx  . CHOLECYSTECTOMY  1990s  . COLONOSCOPY    . CYSTOSCOPY  07/05/2005  . KNEE SURGERY  1980s   left  . KYPHOPLASTY N/A 06/15/2017   Procedure: KYPHOPLASTY T11;  Surgeon: Melina Schools, MD;  Location: [facility];  Service: Orthopedics;  Laterality: N/A;  90 mins  . ORIF ANKLE FRACTURE  1980s   right   . TOTAL KNEE ARTHROPLASTY Left 12/08/2015   Procedure: LEFT TOTAL KNEE ARTHROPLASTY;  Surgeon: Paralee Cancel, MD;  Location: [facility];  Service: Orthopedics;  Laterality: Left;  . TRANSOBTURATOR SLING  07/05/2005  . TUBAL LIGATION  1976  . VEIN SURGERY     vein ablation left leg    Family History  Problem Relation Age of Onset  . Cancer Maternal Aunt        breast  . Cancer Paternal Aunt        Multiple Myeloma  . Stomach cancer Paternal Uncle   . Breast cancer Paternal Uncle   . Breast cancer Cousin        early 24s  . Breast cancer Paternal Aunt        late 60s-60s  . Stroke Mother   . Heart attack Father        Died age 33  . Heart attack Paternal Grandfather   . Breast cancer Paternal Aunt        bilateral breast cancer  . Dementia Paternal Aunt   . Congestive Heart Failure Paternal Uncle        Ischemic  . Congestive Heart Failure Paternal Uncle        Ischemic    Allergies  Allergen Reactions  . Augmentin [Amoxicillin-Pot Clavulanate] Itching, Rash and Other (See Comments)    PT DEVELOPED SEVERE RASH INVOLVING MUCUS MEMBRANES or SKIN NECROSIS WITH PENICILLINS: #   #  #  YES  #  #  #    . Penicillins Hives and Other (See Comments)    Has  patient had a PCN reaction causing immediate rash, facial/tongue/throat swelling, SOB or lightheadedness with hypotension: No HAS PT DEVELOPED SEVERE RASH INVOLVING MUCUS MEMBRANES or SKIN NECROSIS: #  #  #  YES  #  #  #   Has patient had a PCN reaction that required hospitalization: No Has patient had a PCN reaction occurring within the last 10 years: No   . Aspirin Hives  . Chlorhexidine Gluconate Rash  . Chocolate Hives and Other (See Comments)    RUNNY NOSE   . Coffee Bean Extract Hives and Other (See Comments)    RUNNY NOSE   . Ibuprofen Hives and Other (See Comments)    RUNNY NOSE  . Oxycodone Hcl Hives and Nausea And Vomiting  . Shrimp [Shellfish Allergy] Hives and Other (See Comments)    RUNNY NOSE   . Sulfonamide Derivatives Hives  . Lipitor [Atorvastatin Calcium]     Leg Pain/Joint Pain/Back Pain    Current Outpatient Medications on File Prior to Visit  Medication Sig Dispense Refill  . ADVAIR DISKUS 100-50 MCG/DOSE AEPB INHALE 1 PUFF TWICE DAILY AT 10 AM AND 5 PM. 60 each 1  . albuterol (VENTOLIN HFA) 108 (90 BASE) MCG/ACT inhaler INHALE 2 PUFFS INTO THE LUNGS EVERY 6 HOURS AS NEEDED FOR SHORTNESS OF BREATH AND WHEEZING. 18 g 3  . amLODipine (NORVASC) 10 MG tablet TAKE 1 TABLET BY MOUTH DAILY. 90 tablet 1  . ARNICA EX Apply 1 application topically daily as needed (pain).    Marland Kitchen b complex vitamins tablet Take 1 tablet by mouth 2 (two) times daily. PLUS FOLIC ACID PLUS VIT. C    . Biotin 5000 MCG CAPS Take 5,000 mcg by mouth daily.     . Capsaicin 0.025 % PADS Apply 1 application topically daily as needed (pain).    . Cholecalciferol (VITAMIN D-3) 5000 UNITS TABS Take 5,000 Units by mouth daily.     . clopidogrel (PLAVIX) 75 MG tablet Take 1 tablet (75 mg total) by mouth daily. 90 tablet 1  . CO-ENZYME Q10-VITAMIN E PO Take 1 tablet by mouth daily.    Marland Kitchen escitalopram (LEXAPRO) 10 MG tablet TAKE 1/2  TO 1 TABLET AT BEDTIME. 90 tablet 1  . Heat Wraps (THERMACARE NECK/WRIST) MISC Apply 1 patch topically daily as needed (pain).    . hydrochlorothiazide (MICROZIDE) 12.5 MG capsule TAKE 1 CAPSULE IN THE MORNING AS NEEDED. 90 capsule 0  . loratadine (CLARITIN) 10 MG tablet Take 10 mg by mouth daily as needed for allergies.     . Menthol, Topical Analgesic, (BIOFREEZE ROLL-ON EX) Apply 1 application topically daily as needed (pain).    . Menthol, Topical Analgesic, 16 % GEL Apply 1 application topically daily as needed (pain).    . Misc Natural Products (OSTEO BI-FLEX ADV TRIPLE ST PO) Take 1 tablet by mouth daily.    . ondansetron (ZOFRAN ODT) 4 MG disintegrating tablet Take 1 tablet (4 mg total) by mouth every 8 (eight) hours as needed for nausea or vomiting. 20 tablet 0  . ondansetron (ZOFRAN) 4 MG tablet Take 4 mg by mouth every 8 (eight) hours as needed for nausea or vomiting.    . pantoprazole (PROTONIX) 40 MG tablet TAKE 1 TABLET BY MOUTH DAILY. 90 tablet 0  . Polyethyl Glycol-Propyl Glycol (SYSTANE OP) Place 1 drop into both eyes daily as needed. Dry eyes    . potassium chloride SA (K-DUR,KLOR-CON) 20 MEQ tablet TAKE 3 TABLETS DAILY. (Patient taking differently: Take 20 meq in  the morning and 40 meq with supper) 270 tablet 4  . Probiotic Product (ALIGN PO) Take 1 tablet by mouth daily.     . ranitidine (ZANTAC) 150 MG tablet Take 150 mg by mouth at bedtime as needed for heartburn.    . Selenium 200 MCG CAPS Take 200 mcg by mouth 2 (two) times daily.     . traMADol (ULTRAM) 50 MG tablet Take 1 tablet (50 mg total) by mouth every 6 (six) hours as needed. (Patient taking differently: Take 50 mg by mouth every 6 (six) hours as needed for severe pain. ) 30 tablet   . Trolamine Salicylate (ASPERCREME EX) Apply 1 application topically daily as needed (pain).     No current facility-administered medications on file prior to visit.     BP 130/60   Temp 99.1 F (37.3 C) (Oral)   Wt 145 lb (65.8  kg)   BMI 25.28 kg/m       Objective:   Physical Exam  Constitutional: She is oriented to person, place, and time. She appears well-developed and well-nourished. No distress.  Cardiovascular: Normal rate, regular rhythm, normal heart sounds and intact distal pulses.  Pulmonary/Chest: Effort normal and breath sounds normal.  Musculoskeletal: She exhibits tenderness (Trace lower extremity pitting edema, much improved since increasing of diuretic).  Neurological: She is alert and oriented to person, place, and time.  Skin: Skin is warm and dry. She is not diaphoretic.  Psychiatric: She has a normal mood and affect. Her behavior is normal. Judgment and thought content normal.  Nursing note and vitals reviewed.     Assessment & Plan:  1. Lower extremity edema -We discussed going back to 12.5 mg HCTZ.  She would like to stay on 25 mg total dose for the time being that she finds this more beneficial than having some mild fatigue.  She was advised that she can try splitting up to doses 1 in the morning and 1 in the early evening to see if this helps with the fatigue aspect.   - Basic Metabolic Panel   Cory Nafziger, NP  

## 2017-12-04 ENCOUNTER — Other Ambulatory Visit: Payer: Self-pay | Admitting: General Surgery

## 2017-12-04 DIAGNOSIS — Z9889 Other specified postprocedural states: Secondary | ICD-10-CM

## 2017-12-04 DIAGNOSIS — N6452 Nipple discharge: Secondary | ICD-10-CM

## 2017-12-08 ENCOUNTER — Encounter: Payer: Self-pay | Admitting: Adult Health

## 2017-12-08 NOTE — Progress Notes (Signed)
Cologuard result was negative.  Patient is aware.

## 2017-12-17 ENCOUNTER — Other Ambulatory Visit: Payer: Self-pay | Admitting: Family Medicine

## 2018-01-09 ENCOUNTER — Other Ambulatory Visit: Payer: Self-pay

## 2018-01-12 ENCOUNTER — Other Ambulatory Visit: Payer: Self-pay | Admitting: General Surgery

## 2018-01-12 ENCOUNTER — Ambulatory Visit
Admission: RE | Admit: 2018-01-12 | Discharge: 2018-01-12 | Disposition: A | Discharge status: Home / Self Care | Payer: Medicare Other | Source: Ambulatory Visit | Attending: General Surgery | Admitting: General Surgery

## 2018-01-12 ENCOUNTER — Other Ambulatory Visit: Payer: Self-pay | Admitting: Family Medicine

## 2018-01-12 DIAGNOSIS — Z9889 Other specified postprocedural states: Secondary | ICD-10-CM

## 2018-01-12 DIAGNOSIS — I10 Essential (primary) hypertension: Secondary | ICD-10-CM

## 2018-01-12 DIAGNOSIS — N6452 Nipple discharge: Secondary | ICD-10-CM

## 2018-01-12 DIAGNOSIS — N6320 Unspecified lump in the left breast, unspecified quadrant: Secondary | ICD-10-CM

## 2018-01-12 NOTE — Telephone Encounter (Signed)
Cory's Pt 

## 2018-01-16 ENCOUNTER — Ambulatory Visit
Admission: RE | Admit: 2018-01-16 | Discharge: 2018-01-16 | Disposition: A | Discharge status: Home / Self Care | Payer: Medicare Other | Source: Ambulatory Visit | Attending: General Surgery | Admitting: General Surgery

## 2018-01-16 ENCOUNTER — Other Ambulatory Visit: Payer: Self-pay | Admitting: Family Medicine

## 2018-01-16 ENCOUNTER — Other Ambulatory Visit: Payer: Self-pay | Admitting: General Surgery

## 2018-01-16 DIAGNOSIS — N6320 Unspecified lump in the left breast, unspecified quadrant: Secondary | ICD-10-CM

## 2018-01-16 DIAGNOSIS — Z9889 Other specified postprocedural states: Secondary | ICD-10-CM

## 2018-01-16 DIAGNOSIS — I10 Essential (primary) hypertension: Secondary | ICD-10-CM

## 2018-01-16 NOTE — Telephone Encounter (Signed)
12.5 mg BID.

## 2018-01-16 NOTE — Telephone Encounter (Signed)
Pt taking 1 tab bid or two tabs at once?

## 2018-01-18 MED ORDER — HYDROCHLOROTHIAZIDE 12.5 MG PO CAPS: ORAL_CAPSULE | ORAL | 0 refills | Status: DC

## 2018-01-18 NOTE — Telephone Encounter (Signed)
Copied from [facility] [id number]. Topic: General - Other >> Jan 18, 2018  2:29 PM Janace Aris A wrote: Reason for CRM: [facility] called in requesting a new script for the medication hydrochlorothiazide (MICROZIDE) 12.5 MG capsule , that specifies that the pt should be taking this twice a day.    Please advise

## 2018-01-19 NOTE — Telephone Encounter (Addendum)
Has already been taking care of

## 2018-01-19 NOTE — Telephone Encounter (Deleted)
Has*

## 2018-01-30 ENCOUNTER — Other Ambulatory Visit: Payer: Self-pay | Admitting: Adult Health

## 2018-01-30 DIAGNOSIS — I10 Essential (primary) hypertension: Secondary | ICD-10-CM

## 2018-01-31 ENCOUNTER — Encounter: Payer: Self-pay | Admitting: Family Medicine

## 2018-01-31 NOTE — Telephone Encounter (Signed)
30 day supply sent to the pharmacy.  Pt is now due for cpx with [facility].  Letter sent.

## 2018-02-03 ENCOUNTER — Other Ambulatory Visit: Payer: Self-pay | Admitting: Adult Health

## 2018-02-03 DIAGNOSIS — K219 Gastro-esophageal reflux disease without esophagitis: Secondary | ICD-10-CM

## 2018-02-05 ENCOUNTER — Other Ambulatory Visit: Payer: Self-pay | Admitting: Adult Health

## 2018-02-05 DIAGNOSIS — K219 Gastro-esophageal reflux disease without esophagitis: Secondary | ICD-10-CM

## 2018-02-05 NOTE — Telephone Encounter (Signed)
Sent to the pharmacy by e-scribe for 30 days.  Pt is past due for cpx.  A contact letter was sent on 01/31/18.

## 2018-02-05 NOTE — Telephone Encounter (Signed)
DENIED.  FILLED FOR 30 DAYS ON 02/05/18.  PT NEEDS CPX.

## 2018-02-22 ENCOUNTER — Other Ambulatory Visit: Payer: Self-pay | Admitting: Adult Health

## 2018-02-22 DIAGNOSIS — I10 Essential (primary) hypertension: Secondary | ICD-10-CM

## 2018-03-08 ENCOUNTER — Other Ambulatory Visit: Payer: Self-pay | Admitting: Adult Health

## 2018-03-08 DIAGNOSIS — I10 Essential (primary) hypertension: Secondary | ICD-10-CM

## 2018-03-12 ENCOUNTER — Other Ambulatory Visit: Payer: Self-pay | Admitting: Adult Health

## 2018-03-12 DIAGNOSIS — K219 Gastro-esophageal reflux disease without esophagitis: Secondary | ICD-10-CM

## 2018-04-04 ENCOUNTER — Other Ambulatory Visit: Payer: Self-pay | Admitting: Adult Health

## 2018-04-04 DIAGNOSIS — I10 Essential (primary) hypertension: Secondary | ICD-10-CM

## 2018-04-10 ENCOUNTER — Other Ambulatory Visit: Payer: Self-pay | Admitting: Adult Health

## 2018-04-10 DIAGNOSIS — I639 Cerebral infarction, unspecified: Secondary | ICD-10-CM

## 2018-04-10 DIAGNOSIS — I10 Essential (primary) hypertension: Secondary | ICD-10-CM

## 2018-04-10 NOTE — Telephone Encounter (Signed)
Last cpx was 1 year 4 months ago.  I have mailed a contact letter to the pt.  No appt scheduled.  Please advise.

## 2018-04-11 NOTE — Telephone Encounter (Signed)
30 day supply sent

## 2018-05-08 ENCOUNTER — Other Ambulatory Visit: Payer: Self-pay | Admitting: Adult Health

## 2018-05-08 DIAGNOSIS — I10 Essential (primary) hypertension: Secondary | ICD-10-CM

## 2018-05-09 NOTE — Telephone Encounter (Signed)
Sent to the pharmacy by e-scribe for 30 days. 

## 2018-05-09 NOTE — Telephone Encounter (Signed)
[place] for 30 days. Needs CPE

## 2018-05-25 ENCOUNTER — Telehealth: Payer: Self-pay

## 2018-05-25 NOTE — Telephone Encounter (Signed)
Pt rescheduled

## 2018-05-29 ENCOUNTER — Encounter: Payer: Self-pay | Admitting: Adult Health

## 2018-05-30 ENCOUNTER — Ambulatory Visit (INDEPENDENT_AMBULATORY_CARE_PROVIDER_SITE_OTHER): Payer: Medicare Other | Admitting: Adult Health

## 2018-05-30 ENCOUNTER — Other Ambulatory Visit: Payer: Self-pay

## 2018-05-30 ENCOUNTER — Encounter: Payer: Self-pay | Admitting: Adult Health

## 2018-05-30 ENCOUNTER — Other Ambulatory Visit: Payer: Self-pay | Admitting: Family Medicine

## 2018-05-30 VITALS — BP 130/80 | Temp 98.1°F | Ht 63.75 in | Wt 143.0 lb

## 2018-05-30 DIAGNOSIS — I10 Essential (primary) hypertension: Secondary | ICD-10-CM

## 2018-05-30 DIAGNOSIS — F419 Anxiety disorder, unspecified: Secondary | ICD-10-CM

## 2018-05-30 DIAGNOSIS — J449 Chronic obstructive pulmonary disease, unspecified: Secondary | ICD-10-CM

## 2018-05-30 DIAGNOSIS — Z0000 Encounter for general adult medical examination without abnormal findings: Secondary | ICD-10-CM

## 2018-05-30 DIAGNOSIS — E785 Hyperlipidemia, unspecified: Secondary | ICD-10-CM | POA: Diagnosis not present

## 2018-05-30 DIAGNOSIS — F329 Major depressive disorder, single episode, unspecified: Secondary | ICD-10-CM

## 2018-05-30 DIAGNOSIS — F32A Depression, unspecified: Secondary | ICD-10-CM

## 2018-05-30 LAB — COMPREHENSIVE METABOLIC PANEL
ALT: 10 U/L (ref 0–35)
AST: 17 U/L (ref 0–37)
Albumin: 4.5 g/dL (ref 3.5–5.2)
Alkaline Phosphatase: 51 U/L (ref 39–117)
BUN: 19 mg/dL (ref 6–23)
CALCIUM: 9.9 mg/dL (ref 8.4–10.5)
CHLORIDE: 102 meq/L (ref 96–112)
CO2: 31 meq/L (ref 19–32)
Creatinine, Ser: 0.86 mg/dL (ref 0.40–1.20)
GFR: 64.24 mL/min (ref >60.00)
Glucose, Bld: 86 mg/dL (ref 70–99)
POTASSIUM: 4.4 meq/L (ref 3.5–5.1)
SODIUM: 142 meq/L (ref 135–145)
Total Bilirubin: 1 mg/dL (ref 0.2–1.2)
Total Protein: 7 g/dL (ref 6.0–8.3)

## 2018-05-30 LAB — LIPID PANEL
CHOLESTEROL: 273 mg/dL — AB (ref 0–200)
HDL: 73.5 mg/dL (ref >39.00)
LDL CALC: 174 mg/dL — AB (ref 0–99)
NonHDL: 199.97
TRIGLYCERIDES: 131 mg/dL (ref 0.0–149.0)
Total CHOL/HDL Ratio: 4
VLDL: 26.2 mg/dL (ref 0.0–40.0)

## 2018-05-30 LAB — CBC WITH DIFFERENTIAL/PLATELET
BASOS PCT: 1.7 % (ref 0.0–3.0)
Basophils Absolute: 0.1 10*3/uL (ref 0.0–0.1)
EOS ABS: 0.1 10*3/uL (ref 0.0–0.7)
Eosinophils Relative: 1.4 % (ref 0.0–5.0)
HCT: 40 % (ref 36.0–46.0)
Hemoglobin: 13.3 g/dL (ref 12.0–15.0)
LYMPHS ABS: 2 10*3/uL (ref 0.7–4.0)
Lymphocytes Relative: 36.2 % (ref 12.0–46.0)
MCHC: 33.1 g/dL (ref 30.0–36.0)
MCV: 87.7 fl (ref 78.0–100.0)
Monocytes Absolute: 0.5 10*3/uL (ref 0.1–1.0)
Monocytes Relative: 8.5 % (ref 3.0–12.0)
NEUTROS ABS: 2.9 10*3/uL (ref 1.4–7.7)
NEUTROS PCT: 52.2 % (ref 43.0–77.0)
PLATELETS: 229 10*3/uL (ref 150.0–400.0)
RBC: 4.56 Mil/uL (ref 3.87–5.11)
RDW: 13.7 % (ref 11.5–15.5)
WBC: 5.6 10*3/uL (ref 4.0–10.5)

## 2018-05-30 LAB — TSH: TSH: 4.21 u[IU]/mL (ref 0.35–4.50)

## 2018-05-30 MED ORDER — ESCITALOPRAM OXALATE 10 MG PO TABS: ORAL_TABLET | ORAL | 1 refills | Status: DC

## 2018-05-30 MED ORDER — AMLODIPINE BESYLATE 10 MG PO TABS: 10.0000 mg | ORAL_TABLET | Freq: Every day | ORAL | 3 refills | Status: DC

## 2018-05-30 MED ORDER — ALBUTEROL SULFATE HFA 108 (90 BASE) MCG/ACT IN AERS: INHALATION_SPRAY | RESPIRATORY_TRACT | 3 refills | Status: DC

## 2018-05-30 MED ORDER — HYDROCHLOROTHIAZIDE 12.5 MG PO CAPS: ORAL_CAPSULE | ORAL | 1 refills | Status: DC

## 2018-05-30 MED ORDER — MOMETASONE FURO-FORMOTEROL FUM 200-5 MCG/ACT IN AERO: 2.0000 | INHALATION_SPRAY | Freq: Two times a day (BID) | RESPIRATORY_TRACT | 6 refills | Status: DC

## 2018-05-30 NOTE — Progress Notes (Signed)
Subjective:    Patient ID: Alexandria [name], female    DOB: [birth year], 78 y.o.   MRN: [id number]  HPI Patient presents for yearly preventative medicine examination. 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   Essential Hypertension -takes Norvasc 5 mg and HCTZ 12.5 mg for blood pressure control.  She takes Norvasc 10  mg,& HCTZ 12.5 mg for blood pressure control.  BP Readings from Last 3 Encounters:  05/30/18 130/80  11/21/17 130/60  11/14/17 130/66   Hyperlipidemia -takes Lipitor Lab Results  Component Value Date   CHOL 179 11/29/2016   HDL 67.20 11/29/2016   LDLCALC 84 11/29/2016   LDLDIRECT 183.5 12/17/2008   TRIG 138.0 11/29/2016   CHOLHDL 3 11/29/2016    Anxiety and Depression -takes Lexapro.  Feels well controlled with this medication  History of Stroke and atrial fibrillation.  She is currently on Plavix 75 mg.  She was on metoprolol in the past but was DC'd due to bradycardia.  She reports no chest pain, feeling of palpitations, or shortness of breath  COPD - Controlled with Advair and Ventolin. Would like to switch from Advair back to [facility] due to cost.   Osteoporosis -only prescribed vitamin D and calcium  supplementation.  All immunizations and health maintenance protocols were reviewed with the patient and needed orders were placed.  Appropriate screening laboratory values were ordered for the patient including screening of hyperlipidemia, renal function and hepatic function.  Medication reconciliation,  past medical history, social history, problem list and allergies were reviewed in detail with the patient  Goals were established with regard to weight loss, exercise, and  diet in compliance with medications  Wt Readings from Last 3 Encounters:  05/30/18 143 lb (64.9 kg)  11/21/17 145 lb (65.8 kg)  11/14/17 147 lb (66.7 kg)   End of life planning was discussed.  She did cologuard in 2019 - negative    Review of Systems  Constitutional: Negative.   HENT: Negative.   Respiratory: Negative.   Cardiovascular: Positive for leg swelling (chronic).  Genitourinary: Negative.   Musculoskeletal: Positive for arthralgias and back pain.  Skin: Negative.   Neurological: Negative.   Hematological: Negative.   Psychiatric/Behavioral: Negative.   All other systems reviewed and are negative.  Past Medical History:  Diagnosis Date  . Allergy   . Anemia    hx of   . Anxiety   . Arthritis    left knee, hands, back  . Asthma    daily and prn inhalers  . Brainstem infarct, acute ([place]) 03/2011   slight expressive aphasia, occ. problems with balance  . Breast cancer ([place]) 12/2011   right, ER+, PR -, Her 2 -  . COPD (chronic obstructive pulmonary disease) ([place])   . Degenerative joint disease of low back   . Depression   . Dysrhythmia    atrial fib   . GERD (gastroesophageal reflux disease)   . Hearing loss  bilateral hearing aids  . 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 History of kidney stones   . Hx of radiation therapy 03/12/12 -04/13/12   right breast  . Hyperlipidemia   . Hypertension    under control, has been on med. since age 54  .  IBS (irritable bowel syndrome)   . Long term current use of aromatase inhibitor 05/08/2012  . Overactive bladder   . Palpitations   . Pneumonia    hx of x 2   . PONV (postoperative nausea and vomiting)   . PVD (peripheral vascular disease) ([facility])    varicose veins - left worse than right  . Spinal stenosis   . Stress incontinence   . Stroke [facility])    problem with expressive communicaiton  . Traumatic hemopneumothorax 1982   left    Social History   Socioeconomic History  . Marital status: Widowed    Spouse name: Not on file  . Number of children: Not on file  . Years of education: Not on file  . Highest education level: Not on file  Occupational History  . Not on file  Social Needs  . Financial resource strain: Not on file  . Food insecurity:    Worry: Not on file    Inability: Not on file  . Transportation needs:    Medical: Not on file    Non-medical: Not on file  Tobacco Use  . Smoking status: Former Smoker    Packs/day: 0.50    Years: 15.00    Pack years: 7.50    Types: Cigarettes    Last attempt to quit: 08/28/1993    Years since quitting: 24.7  . Smokeless tobacco: Never Used  Substance and Sexual Activity  . Alcohol use: No    Alcohol/week: 0.0 standard drinks  . Drug use: No  . Sexual activity: Not Currently  Lifestyle  . Physical activity:    Days per week: Not on file    Minutes per session: Not on file  . Stress: Not on file  Relationships  . Social connections:    Talks on phone: Not on file    Gets together: Not on file    Attends religious service: Not on file    Active member of club or organization: Not on file    Attends meetings of clubs or organizations: Not on file    Relationship status: Not on file  . Intimate partner violence:    Fear of current or ex partner: Not on file    Emotionally abused: Not on file    Physically abused: Not on file    Forced sexual activity: Not on file  Other Topics Concern  . Not on file  Social History  Narrative   Widowed since 2014   Retired Scientist, product/process development    Son and daughter    Past Surgical History:  Procedure Laterality Date  . ANKLE HARDWARE REMOVAL     right  . BREAST BIOPSY  1976   right  . BREAST LUMPECTOMY  2013   right with snbx  . CHOLECYSTECTOMY  1990s  . COLONOSCOPY    . CYSTOSCOPY  07/05/2005  . KNEE SURGERY  1980s   left  . KYPHOPLASTY N/A 06/15/2017   Procedure: KYPHOPLASTY T11;  Surgeon: Melina Schools, MD;  Location: [facility];  Service: Orthopedics;  Laterality: N/A;  90 mins  . ORIF ANKLE FRACTURE  1980s   right   . TOTAL  KNEE ARTHROPLASTY Left 12/08/2015   Procedure: LEFT TOTAL KNEE ARTHROPLASTY;  Surgeon: Paralee Cancel, MD;  Location: [facility];  Service: Orthopedics;  Laterality: Left;  . TRANSOBTURATOR SLING  07/05/2005  . TUBAL LIGATION  1976  . VEIN SURGERY     vein ablation left leg    Family History  Problem Relation Age of Onset  . Cancer Maternal Aunt        breast  . Cancer Paternal Aunt        Multiple Myeloma  . Stomach cancer Paternal Uncle   . Breast cancer Paternal Uncle   . Breast cancer Cousin        early 63s  . Breast cancer Paternal Aunt        late 49s-60s  . Stroke Mother   . Heart attack Father        Died age 17  . Heart attack Paternal Grandfather   . Breast cancer Paternal Aunt        bilateral breast cancer  . Dementia Paternal Aunt   . Congestive Heart Failure Paternal Uncle        Ischemic  . Congestive Heart Failure Paternal Uncle        Ischemic    Allergies  Allergen Reactions  . Augmentin [Amoxicillin-Pot Clavulanate] Itching, Rash and Other (See Comments)    PT DEVELOPED SEVERE RASH INVOLVING MUCUS MEMBRANES or SKIN NECROSIS WITH PENICILLINS: #  #  #  YES  #  #  #    . Penicillins Hives and Other (See Comments)    Has patient had a PCN reaction causing immediate rash, facial/tongue/throat swelling, SOB or lightheadedness with hypotension: No HAS PT DEVELOPED SEVERE RASH INVOLVING MUCUS MEMBRANES or SKIN NECROSIS: #   #  #  YES  #  #  #   Has patient had a PCN reaction that required hospitalization: No Has patient had a PCN reaction occurring within the last 10 years: No   . Aspirin Hives  . Chlorhexidine Gluconate Rash  . Chocolate Hives and Other (See Comments)    RUNNY NOSE   . Coffee Bean Extract Hives and Other (See Comments)    RUNNY NOSE   . Ibuprofen Hives and Other (See Comments)    RUNNY NOSE  . Oxycodone Hcl Hives and Nausea And Vomiting  . Shrimp [Shellfish Allergy] Hives and Other (See Comments)    RUNNY NOSE   . Sulfonamide Derivatives Hives  . Lipitor [Atorvastatin Calcium]     Leg Pain/Joint Pain/Back Pain    Current Outpatient Medications on File Prior to Visit  Medication Sig Dispense Refill  . ARNICA EX Apply 1 application topically daily as needed (pain).    Marland Kitchen b complex vitamins tablet Take 1 tablet by mouth 2 (two) times daily. PLUS FOLIC ACID PLUS VIT. C    . Biotin 5000 MCG CAPS Take 5,000 mcg by mouth daily.     . Capsaicin 0.025 % PADS Apply 1 application topically daily as needed (pain).    . Cholecalciferol (VITAMIN D-3) 5000 UNITS TABS Take 5,000 Units by mouth daily.     Marland Kitchen CO-ENZYME Q10-VITAMIN E PO Take 1 tablet by mouth daily.    Marland Kitchen Heat Wraps (THERMACARE NECK/WRIST) MISC Apply 1 patch topically daily as needed (pain).    Marland Kitchen loratadine (CLARITIN) 10 MG tablet Take 10 mg by mouth daily as needed for allergies.     . Menthol, Topical Analgesic, (BIOFREEZE ROLL-ON EX) Apply 1 application topically daily as needed (pain).    Marland Kitchen  Menthol, Topical Analgesic, 16 % GEL Apply 1 application topically daily as needed (pain).    . Misc Natural Products (OSTEO BI-FLEX ADV TRIPLE ST PO) Take 1 tablet by mouth daily.    . ondansetron (ZOFRAN ODT) 4 MG disintegrating tablet Take 1 tablet (4 mg total) by mouth every 8 (eight) hours as needed for nausea or vomiting. 20 tablet 0  . pantoprazole (PROTONIX) 40 MG tablet TAKE 1 TABLET BY MOUTH DAILY. 90 tablet 3  . Polyethyl  Glycol-Propyl Glycol (SYSTANE OP) Place 1 drop into both eyes daily as needed. Dry eyes    . potassium chloride SA (K-DUR,KLOR-CON) 20 MEQ tablet TAKE 3 TABLETS DAILY. 270 tablet 0  . Probiotic Product (ALIGN PO) Take 1 tablet by mouth daily.     . ranitidine (ZANTAC) 150 MG tablet Take 150 mg by mouth at bedtime as needed for heartburn.    . Selenium 200 MCG CAPS Take 200 mcg by mouth 2 (two) times daily.     . traMADol (ULTRAM) 50 MG tablet Take 1 tablet (50 mg total) by mouth every 6 (six) hours as needed. (Patient taking differently: Take 50 mg by mouth every 6 (six) hours as needed for severe pain. ) 30 tablet   . Trolamine Salicylate (ASPERCREME EX) Apply 1 application topically daily as needed (pain).     No current facility-administered medications on file prior to visit.     BP 130/80   Temp 98.1 F (36.7 C)   Ht 5' 3.75" (1.619 m)   Wt 143 lb (64.9 kg)   BMI 24.74 kg/m       Objective:   Physical Exam Vitals signs and nursing note reviewed.  Constitutional:      General: She is not in acute distress.    Appearance: Normal appearance. She is well-developed and normal weight.  HENT:     Head: Normocephalic and atraumatic.     Right Ear: Tympanic membrane, ear canal and external ear normal. There is no impacted cerumen.     Left Ear: Tympanic membrane, ear canal and external ear normal. There is no impacted cerumen.     Nose: Nose normal. No congestion or rhinorrhea.     Mouth/Throat:     Mouth: Mucous membranes are moist.     Pharynx: Oropharynx is clear. No oropharyngeal exudate.  Eyes:     General:        Right eye: No discharge.        Left eye: No discharge.     Extraocular Movements: Extraocular movements intact.     Conjunctiva/sclera: Conjunctivae normal.     Pupils: Pupils are equal, round, and reactive to light.  Neck:     Musculoskeletal: Normal range of motion and neck supple.     Thyroid: No thyromegaly.     Trachea: No tracheal deviation.   Cardiovascular:     Rate and Rhythm: Normal rate and regular rhythm.     Pulses: Normal pulses.     Heart sounds: Normal heart sounds. No murmur. No friction rub. No gallop.   Pulmonary:     Effort: Pulmonary effort is normal. No respiratory distress.     Breath sounds: Normal breath sounds. No stridor. No wheezing, rhonchi or rales.  Chest:     Chest wall: No tenderness.  Abdominal:     General: Bowel sounds are normal. There is no distension.     Palpations: Abdomen is soft. There is no mass.     Tenderness: There is no  abdominal tenderness. There is no right CVA tenderness, left CVA tenderness, guarding or rebound.     Hernia: No hernia is present.  Musculoskeletal: Normal range of motion.        General: No swelling, tenderness, deformity or signs of injury.     Right lower leg: Edema present.     Left lower leg: Edema present.  Lymphadenopathy:     Cervical: No cervical adenopathy.  Skin:    General: Skin is warm and dry.     Capillary Refill: Capillary refill takes less than 2 seconds.     Coloration: Skin is not jaundiced or pale.     Findings: No bruising, erythema, lesion or rash.  Neurological:     General: No focal deficit present.     Mental Status: She is alert and oriented to person, place, and time.     Cranial Nerves: No cranial nerve deficit.     Sensory: No sensory deficit.     Motor: No weakness.     Coordination: Coordination normal.     Gait: Gait normal.     Deep Tendon Reflexes: Reflexes normal.  Psychiatric:        Mood and Affect: Mood normal.        Behavior: Behavior normal.        Thought Content: Thought content normal.        Judgment: Judgment normal.        Assessment & Plan:  1. Routine general medical examination at a health care facility - Continue to stay active and eat healthy  - Follow up in one year or sooner if needed - CBC with Differential/Platelet - Comprehensive metabolic panel - Lipid panel - TSH  2. Essential  hypertension, benign - No change in medication  - CBC with Differential/Platelet - Comprehensive metabolic panel - Lipid panel - TSH - amLODipine (NORVASC) 10 MG tablet; Take 1 tablet (10 mg total) by mouth daily.  Dispense: 90 tablet; Refill: 3 - hydrochlorothiazide (MICROZIDE) 12.5 MG capsule; Take daily PRN  Dispense: 90 capsule; Refill: 1  3. Anxiety and depression  - escitalopram (LEXAPRO) 10 MG tablet; Take one tablet daily  Dispense: 90 tablet; Refill: 1  4. Chronic obstructive pulmonary disease, unspecified COPD type ([place])  - mometasone-formoterol (DULERA) 200-5 MCG/ACT AERO; Inhale 2 puffs into the lungs 2 (two) times daily.  Dispense: 13 g; Refill: 6 - albuterol (VENTOLIN HFA) 108 (90 Base) MCG/ACT inhaler; INHALE 2 PUFFS INTO THE LUNGS EVERY 6 HOURS AS NEEDED FOR SHORTNESS OF BREATH AND WHEEZING.  Dispense: 18 g; Refill: 3  5. Hyperlipidemia, unspecified hyperlipidemia type - Consider increase in statin  - CBC with Differential/Platelet - Comprehensive metabolic panel - Lipid panel - TSH  [name], NP

## 2018-05-30 NOTE — Progress Notes (Signed)
error 

## 2018-05-31 ENCOUNTER — Other Ambulatory Visit: Payer: Self-pay | Admitting: Family Medicine

## 2018-05-31 MED ORDER — SIMVASTATIN 5 MG PO TABS: 5.0000 mg | ORAL_TABLET | ORAL | 3 refills | Status: DC

## 2018-07-10 ENCOUNTER — Other Ambulatory Visit: Payer: Self-pay | Admitting: Adult Health

## 2018-07-11 NOTE — Telephone Encounter (Signed)
Sent to the pharmacy by e-scribe. 

## 2018-07-23 ENCOUNTER — Other Ambulatory Visit: Payer: Self-pay | Admitting: Endocrinology

## 2018-07-23 ENCOUNTER — Other Ambulatory Visit: Payer: Self-pay

## 2018-07-23 ENCOUNTER — Other Ambulatory Visit (INDEPENDENT_AMBULATORY_CARE_PROVIDER_SITE_OTHER): Payer: Medicare Other

## 2018-07-23 DIAGNOSIS — M85851 Other specified disorders of bone density and structure, right thigh: Secondary | ICD-10-CM | POA: Diagnosis not present

## 2018-07-23 DIAGNOSIS — M85852 Other specified disorders of bone density and structure, left thigh: Secondary | ICD-10-CM

## 2018-07-23 DIAGNOSIS — N200 Calculus of kidney: Secondary | ICD-10-CM

## 2018-07-23 LAB — BASIC METABOLIC PANEL
BUN: 17 mg/dL (ref 6–23)
CO2: 28 mEq/L (ref 19–32)
Calcium: 9.5 mg/dL (ref 8.4–10.5)
Chloride: 103 mEq/L (ref 96–112)
Creatinine, Ser: 0.85 mg/dL (ref 0.40–1.20)
GFR: 65.09 mL/min (ref >60.00)
Glucose, Bld: 86 mg/dL (ref 70–99)
Potassium: 4.2 mEq/L (ref 3.5–5.1)
Sodium: 139 mEq/L (ref 135–145)

## 2018-07-23 LAB — VITAMIN D 25 HYDROXY (VIT D DEFICIENCY, FRACTURES): VITD: 63.82 ng/mL (ref 30.00–100.00)

## 2018-07-23 NOTE — Addendum Note (Signed)
Addended by: Kaylyn Lim I on: 07/23/2018 03:48 PM   Modules accepted: Orders

## 2018-07-23 NOTE — Addendum Note (Signed)
Addended by: Kaylyn Lim I on: 07/23/2018 03:35 PM   Modules accepted: Orders

## 2018-07-23 NOTE — Addendum Note (Signed)
Addended by: Kaylyn Lim I on: 07/23/2018 03:42 PM   Modules accepted: Orders

## 2018-07-23 NOTE — Addendum Note (Signed)
Addended by: Kaylyn Lim I on: 07/23/2018 03:37 PM   Modules accepted: Orders

## 2018-07-23 NOTE — Addendum Note (Signed)
Addended by: Kaylyn Lim I on: 07/23/2018 03:33 PM   Modules accepted: Orders

## 2018-07-26 ENCOUNTER — Other Ambulatory Visit: Payer: Self-pay

## 2018-07-26 ENCOUNTER — Ambulatory Visit (INDEPENDENT_AMBULATORY_CARE_PROVIDER_SITE_OTHER): Payer: Medicare Other | Admitting: Endocrinology

## 2018-07-26 ENCOUNTER — Encounter: Payer: Self-pay | Admitting: Endocrinology

## 2018-07-26 VITALS — BP 140/70 | HR 92 | Ht 63.25 in

## 2018-07-26 DIAGNOSIS — M85851 Other specified disorders of bone density and structure, right thigh: Secondary | ICD-10-CM | POA: Diagnosis not present

## 2018-07-26 DIAGNOSIS — M85852 Other specified disorders of bone density and structure, left thigh: Secondary | ICD-10-CM | POA: Diagnosis not present

## 2018-07-26 NOTE — Progress Notes (Signed)
Patient ID: Alexandria [name], female   DOB: [birth year], 78 y.o.   MRN: [id number]          Referring PCP: [name], NP   Chief complaint: Follow-up of osteopenia  History of Present Illness:  She was initially seen in consultation for osteopenia in 07/2017  Patient had a fall on her concrete driveway in August 3545 and was found to have a partial compression of the T11 vertebra Because of continued pain she was treated with kyphoplasty on 06/15/2017 She also has a history of mild height loss Previously on Arimidex for her breast cancer for 5 years unti 04/2017 She had a screening bone density done in 12/2016 and this showed the following T-scores; comparison was done with bone density from 2009:  Results:  Lumbar spine L1-L4 Femoral neck (FN)  T-score +1.3 RFN: -1.2 LFN: -1.5  Change in BMD from previous DXA test (%) -7.5%* -13.2%*     RECENT history:  No complaints of any significant low back pain She is trying to be active, previously had some physical therapy after her cervical surgery  Previous treatment: Alendronate 70 mg weekly in 2014 for a year, not clear why this was stopped, no previous HRT RECLAST infusion in 7/19 which she tolerated well  Calcium supplements: None.  Stopped calcium supplement because of kidney stones  Vitamin D supplements: Vitamin D3 daily, 5000 units Vitamin D level is adequate again   LABS:  Lab Results  Component Value Date   VD25OH 63.82 07/23/2018   VD25OH 54.31 11/29/2016     Past Medical History:  Diagnosis Date  . Allergy   . Anemia    hx of   . Anxiety   . Arthritis    left knee, hands, back  . Asthma    daily and prn inhalers  . 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 problems with balance  . Breast cancer ([place]) 12/2011   right, ER+, PR -, Her 2 -  . COPD (chronic obstructive pulmonary disease) ([place])   . Degenerative joint disease of low back   . Depression   . Dysrhythmia    atrial fib   . GERD (gastroesophageal reflux disease)   . Hearing loss    bilateral hearing aids  . 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 History of kidney stones   . Hx of radiation therapy 03/12/12 -04/13/12   right breast  . Hyperlipidemia   . Hypertension    under control, has been on med. since age 89  . IBS (irritable bowel syndrome)   . Long term current use of aromatase inhibitor 05/08/2012  . Overactive bladder   . Palpitations   . Pneumonia    hx of x 2   . PONV (postoperative nausea and vomiting)   . PVD (peripheral vascular disease) ([facility])    varicose veins - left worse than right  . Spinal stenosis   . Stress incontinence   . Stroke [facility])    problem with expressive communicaiton  . Traumatic hemopneumothorax 1982   left    Past Surgical History:  Procedure Laterality Date  . ANKLE HARDWARE REMOVAL     right  . BREAST BIOPSY  1976   right  . BREAST LUMPECTOMY  2013   right with snbx  . CHOLECYSTECTOMY  1990s  . COLONOSCOPY    . CYSTOSCOPY  07/05/2005  . KNEE SURGERY  1980s   left  .  KYPHOPLASTY N/A 06/15/2017   Procedure: KYPHOPLASTY T11;  Surgeon: Melina Schools, MD;  Location: [place];  Service: Orthopedics;  Laterality: N/A;  90 mins  . ORIF ANKLE FRACTURE  1980s   right   . TOTAL KNEE ARTHROPLASTY Left 12/08/2015   Procedure: LEFT TOTAL KNEE ARTHROPLASTY;  Surgeon: Paralee Cancel, MD;  Location: [facility];  Service: Orthopedics;  Laterality: Left;  . TRANSOBTURATOR SLING  07/05/2005  . TUBAL LIGATION  1976  . VEIN SURGERY     vein ablation left leg    Family History  Problem Relation Age of Onset  . Cancer Maternal Aunt        breast  . Cancer Paternal Aunt        Multiple Myeloma  . Stomach cancer Paternal Uncle   . Breast cancer Paternal Uncle   . Breast cancer Cousin        early 92s  . Breast cancer Paternal Aunt        late 81s-60s  . Stroke Mother   . Heart attack Father        Died age 66  . Heart attack  Paternal Grandfather   . Breast cancer Paternal Aunt        bilateral breast cancer  . Dementia Paternal Aunt   . Congestive Heart Failure Paternal Uncle        Ischemic  . Congestive Heart Failure Paternal Uncle        Ischemic    Social History:  reports that she quit smoking about 24 years ago. Her smoking use included cigarettes. She has a 7.50 pack-year smoking history. She has never used smokeless tobacco. She reports that she does not drink alcohol or use drugs.  Allergies:  Allergies  Allergen Reactions  . Augmentin [Amoxicillin-Pot Clavulanate] Itching, Rash and Other (See Comments)    PT DEVELOPED SEVERE RASH INVOLVING MUCUS MEMBRANES or SKIN NECROSIS WITH PENICILLINS: #  #  #  YES  #  #  #    . Penicillins Hives and Other (See Comments)    Has patient had a PCN reaction causing immediate rash, facial/tongue/throat swelling, SOB or lightheadedness with hypotension: No HAS PT DEVELOPED SEVERE RASH INVOLVING MUCUS MEMBRANES or SKIN NECROSIS: #  #  #  YES  #  #  #   Has patient had a PCN reaction that required hospitalization: No Has patient had a PCN reaction occurring within the last 10 years: No   . Aspirin Hives  . Chlorhexidine Gluconate Rash  . Chocolate Hives and Other (See Comments)    RUNNY NOSE   . Coffee Bean Extract Hives and Other (See Comments)    RUNNY NOSE   . Ibuprofen Hives and Other (See Comments)    RUNNY NOSE  . Oxycodone Hcl Hives and Nausea And Vomiting  . Shrimp [Shellfish Allergy] Hives and Other (See Comments)    RUNNY NOSE   . Sulfonamide Derivatives Hives  . Lipitor [Atorvastatin Calcium]     Leg Pain/Joint Pain/Back Pain    Allergies as of 07/26/2018      Reactions   Augmentin [amoxicillin-pot Clavulanate] Itching, Rash, Other (See Comments)   PT DEVELOPED SEVERE RASH INVOLVING MUCUS MEMBRANES or SKIN NECROSIS WITH PENICILLINS: #  #  #  YES  #  #  #     Penicillins Hives, Other (See Comments)   Has patient had a PCN reaction causing  immediate rash, facial/tongue/throat swelling, SOB or lightheadedness with hypotension: No HAS PT DEVELOPED SEVERE RASH  INVOLVING MUCUS MEMBRANES or SKIN NECROSIS: #  #  #  YES  #  #  #   Has patient had a PCN reaction that required hospitalization: No Has patient had a PCN reaction occurring within the last 10 years: No   Aspirin Hives   Chlorhexidine Gluconate Rash   Chocolate Hives, Other (See Comments)   RUNNY NOSE   Coffee Bean Extract Hives, Other (See Comments)   RUNNY NOSE   Ibuprofen Hives, Other (See Comments)   RUNNY NOSE   Oxycodone Hcl Hives, Nausea And Vomiting   Shrimp [shellfish Allergy] Hives, Other (See Comments)   RUNNY NOSE   Sulfonamide Derivatives Hives   Lipitor [atorvastatin Calcium]    Leg Pain/Joint Pain/Back Pain      Medication List       Accurate as of Jul 26, 2018  3:55 PM. If you have any questions, ask your nurse or doctor.        albuterol 108 (90 Base) MCG/ACT inhaler Commonly known as:  Ventolin HFA INHALE 2 PUFFS INTO THE LUNGS EVERY 6 HOURS AS NEEDED FOR SHORTNESS OF BREATH AND WHEEZING.   ALIGN PO Take 1 tablet by mouth daily.   amLODipine 10 MG tablet Commonly known as:  NORVASC Take 1 tablet (10 mg total) by mouth daily.   ARNICA EX Apply 1 application topically daily as needed (pain).   ASPERCREME EX Apply 1 application topically daily as needed (pain).   b complex vitamins tablet Take 1 tablet by mouth 2 (two) times daily. PLUS FOLIC ACID PLUS VIT. C   Biotin 5000 MCG Caps Take 5,000 mcg by mouth daily.   Capsaicin 0.025 % Pads Apply 1 application topically daily as needed (pain).   clopidogrel 75 MG tablet Commonly known as:  PLAVIX TAKE 1 TABLET BY MOUTH DAILY.   CO-ENZYME Q10-VITAMIN E PO Take 1 tablet by mouth daily.   escitalopram 10 MG tablet Commonly known as:  LEXAPRO Take one tablet daily   hydrochlorothiazide 12.5 MG capsule Commonly known as:  MICROZIDE Take daily PRN   loratadine 10 MG tablet  Commonly known as:  CLARITIN Take 10 mg by mouth daily as needed for allergies.   Menthol (Topical Analgesic) 16 % Gel Apply 1 application topically daily as needed (pain).   BIOFREEZE ROLL-ON EX Apply 1 application topically daily as needed (pain).   mometasone-formoterol 200-5 MCG/ACT Aero Commonly known as:  DULERA Inhale 2 puffs into the lungs 2 (two) times daily.   ondansetron 4 MG disintegrating tablet Commonly known as:  Zofran ODT Take 1 tablet (4 mg total) by mouth every 8 (eight) hours as needed for nausea or vomiting.   OSTEO BI-FLEX ADV TRIPLE ST PO Take 1 tablet by mouth daily.   pantoprazole 40 MG tablet Commonly known as:  PROTONIX TAKE 1 TABLET BY MOUTH DAILY.   potassium chloride SA 20 MEQ tablet Commonly known as:  K-DUR TAKE 3 TABLETS DAILY.   ranitidine 150 MG tablet Commonly known as:  ZANTAC Take 150 mg by mouth at bedtime as needed for heartburn.   Selenium 200 MCG Caps Take 200 mcg by mouth 2 (two) times daily.   simvastatin 5 MG tablet Commonly known as:  Zocor Take 1 tablet (5 mg total) by mouth 2 (two) times a week.   SYSTANE OP Place 1 drop into both eyes daily as needed. Dry eyes   ThermaCare Neck/Wrist Misc Apply 1 patch topically daily as needed (pain).   traMADol 50 MG tablet Commonly  known as:  ULTRAM Take 1 tablet (50 mg total) by mouth every 6 (six) hours as needed. What changed:  reasons to take this   Vitamin D-3 125 MCG (5000 UT) Tabs Take 5,000 Units by mouth daily.        Review of Systems  She has had an upper normal TSH level, asymptomatic  LABS:  Lab on 07/23/2018  Component Date Value Ref Range Status  . VITD 07/23/2018 63.82  30.00 - 100.00 ng/mL Final  . Sodium 07/23/2018 139  135 - 145 mEq/L Final  . Potassium 07/23/2018 4.2  3.5 - 5.1 mEq/L Final  . Chloride 07/23/2018 103  96 - 112 mEq/L Final  . CO2 07/23/2018 28  19 - 32 mEq/L Final  . Glucose, Bld 07/23/2018 86  70 - 99 mg/dL Final  . BUN  07/23/2018 17  6 - 23 mg/dL Final  . Creatinine, Ser 07/23/2018 0.85  0.40 - 1.20 mg/dL Final  . Calcium 07/23/2018 9.5  8.4 - 10.5 mg/dL Final  . GFR 07/23/2018 65.09  >60.00 mL/min Final     PHYSICAL EXAM:  BP 140/70 (BP Location: Left Arm, Patient Position: Sitting, Cuff Size: Normal)   Pulse 92   SpO2 96%      ASSESSMENT:   OSTEOPENIA at the hip, last  bone density in 10/18 Likely high risk for fracture because of her history of vertebral fracture after a fall No recent height loss  She had Reclast infusion and since she has only osteoporosis may be able to take this every other year  She has adequate vitamin D supplementation  PLAN:  She will have a bone density in 12/2018 If she is showing stability or improvement in her bone density will wait till the following year to do her Reclast Continue vitamin D supplementations   Elayne Snare 07/26/2018, 3:55 PM

## 2018-08-14 ENCOUNTER — Telehealth: Payer: Self-pay | Admitting: Adult Health

## 2018-08-14 MED ORDER — POTASSIUM CHLORIDE CRYS ER 20 MEQ PO TBCR: 60.0000 meq | EXTENDED_RELEASE_TABLET | Freq: Every day | ORAL | 2 refills | Status: DC

## 2018-08-14 NOTE — Telephone Encounter (Signed)
Sent to the pharmacy by e-scribe. 

## 2018-08-14 NOTE — Telephone Encounter (Signed)
Copied from [place] [id number]. Topic: Quick Communication - Rx Refill/Question >> Aug 14, 2018  2:45 PM Izola Price, Nate A wrote: Medication:potassium chloride SA (K-DUR,KLOR-CON) 20 MEQ tablet (Pharmacy stated that they have faxed over prescription on 07/24/2018 and 08/09/2018 and haven't received a response back.)  Has the patient contacted their pharmacy? Yes (Agent: If no, request that the patient contact the pharmacy for the refill.) (Agent: If yes, when and what did the pharmacy advise?)Contact PCP  Preferred Pharmacy (with phone number or street name): [place], [place]. [phone] (Phone) [phone] (Fax)    Agent: Please be advised that RX refills may take up to 3 business days. We ask that you follow-up with your pharmacy.

## 2018-09-19 IMAGING — RF DG THORACOLUMBAR SPINE 2V
1 series · 1 of 1 positions shown · non-contrast
Comparison: Radiographs November 07, 2016.

CLINICAL DATA: T11 kyphoplasty.

EXAM:
DG C-ARM 61-120 MIN; THORACOLUMBAR SPINE - 2 VIEW
Fluoroscopy time: 1 minutes 2 seconds.

[Series 1: run · 1 of 1 slices shown]
[im 1/1]
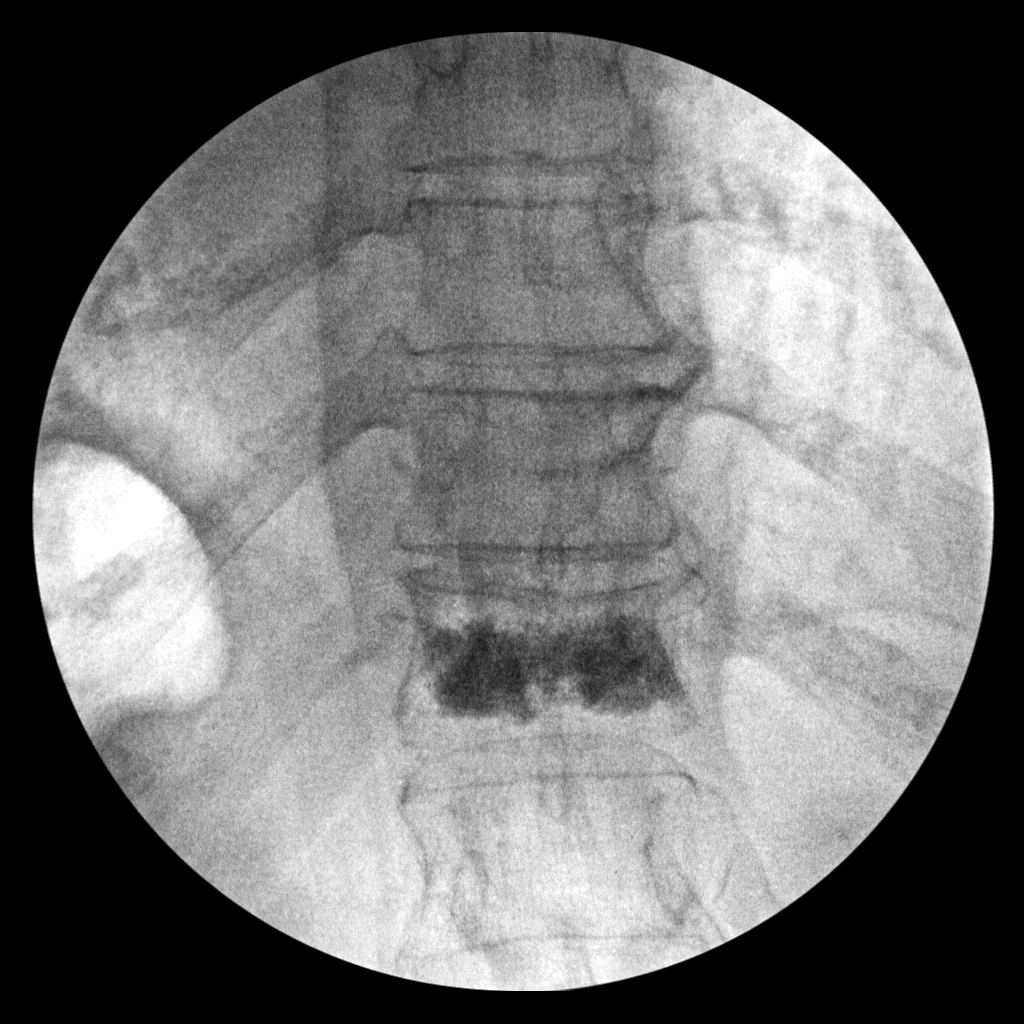

[1 of 1 positions shown; findings below may reference images not displayed]

FINDINGS: Two intraoperative fluoroscopic images of the lower thoracic spine
demonstrate the patient be status post kyphoplasty of T11 vertebral
body.
IMPRESSION: Status post T11 kyphoplasty.

## 2018-10-12 ENCOUNTER — Other Ambulatory Visit: Payer: Self-pay | Admitting: Adult Health

## 2018-10-12 DIAGNOSIS — I10 Essential (primary) hypertension: Secondary | ICD-10-CM

## 2018-10-12 NOTE — Telephone Encounter (Signed)
FILLS FOR 6 MONTHS IN [facility].  REFILL REQUEST IS TOO EARLY.

## 2018-10-12 NOTE — Telephone Encounter (Signed)
DENIED. FILLED FOR 6 MONTHS IN [facility]  REFILL REQUEST IS TOO EARLY.

## 2018-10-14 ENCOUNTER — Other Ambulatory Visit: Payer: Self-pay | Admitting: Adult Health

## 2018-10-14 DIAGNOSIS — I10 Essential (primary) hypertension: Secondary | ICD-10-CM

## 2018-10-16 ENCOUNTER — Other Ambulatory Visit: Payer: Self-pay | Admitting: Adult Health

## 2018-10-16 DIAGNOSIS — I10 Essential (primary) hypertension: Secondary | ICD-10-CM

## 2018-10-16 NOTE — Telephone Encounter (Signed)
DENIED.  FILLED ON 05/30/2018 FOR 6 MONTHS.  REQUEST IS TOO EARLY.  MESSAGE SENT TO THE PHARMACY.

## 2018-10-17 NOTE — Telephone Encounter (Signed)
FILLED ON 05/30/2018 FOR 6 MONTHS.  REQUEST IS TOO EARLY.

## 2018-10-18 ENCOUNTER — Other Ambulatory Visit: Payer: Self-pay | Admitting: Adult Health

## 2018-10-18 DIAGNOSIS — I10 Essential (primary) hypertension: Secondary | ICD-10-CM

## 2018-10-19 ENCOUNTER — Encounter: Payer: Self-pay | Admitting: Family Medicine

## 2018-10-19 NOTE — Telephone Encounter (Signed)
Sent to the pharmacy by e-scribe for 30 days. Pt is past due for cpx and fasting lab work.

## 2018-10-26 ENCOUNTER — Telehealth: Payer: Self-pay | Admitting: Adult Health

## 2018-10-26 DIAGNOSIS — I10 Essential (primary) hypertension: Secondary | ICD-10-CM

## 2018-10-26 NOTE — Telephone Encounter (Signed)
Patient received letter in the mail about getting a physical done and was only given a 30 day supply of her medications.  Pt had a physical on 05/30/18 of this year.    Patient needs a 90 day refill on the following meds:  Amlodipine (has only 4 left) Simvastatin [place]

## 2018-10-30 MED ORDER — HYDROCHLOROTHIAZIDE 12.5 MG PO CAPS: ORAL_CAPSULE | ORAL | 1 refills | Status: DC

## 2018-10-30 MED ORDER — SIMVASTATIN 5 MG PO TABS: ORAL_TABLET | ORAL | 1 refills | Status: DC

## 2018-10-30 MED ORDER — AMLODIPINE BESYLATE 10 MG PO TABS: 10.0000 mg | ORAL_TABLET | Freq: Every day | ORAL | 1 refills | Status: DC

## 2018-10-30 NOTE — Telephone Encounter (Signed)
Error noted.  Medication sent to the pharmacy by e-scribe.

## 2018-11-06 NOTE — Telephone Encounter (Signed)
[place] from [place] called to report that Rx does not match what pt claims is new dosage. Pt states that she was instructed to take hydrochlorothiazide (MICROZIDE) 12.5 MG capsule Please advise

## 2018-11-13 NOTE — Telephone Encounter (Signed)
Pt stated Alexandria [name] changed her dosage for hydrochlorothiazide (MICROZIDE) 12.5 MG capsule to 2x per day one in the morning and one in the evening because he changed it to 25MG  per day during her last appt with her. Requesting 90 day supply. Pt also had CPE on 05/30/18. Rx that was sent to pharmacy was for 1x per day. Pt requesting callback if there are questions.  [place], [place] [phone] (Phone) [phone] (Fax)

## 2018-11-14 MED ORDER — HYDROCHLOROTHIAZIDE 12.5 MG PO CAPS: 12.5000 mg | ORAL_CAPSULE | Freq: Two times a day (BID) | ORAL | 1 refills | Status: DC

## 2018-11-14 NOTE — Telephone Encounter (Signed)
OK to change to BID?

## 2018-11-14 NOTE — Addendum Note (Signed)
Addended by: Miles Costain T on: 11/14/2018 08:47 AM   Modules accepted: Orders

## 2018-11-14 NOTE — Telephone Encounter (Signed)
Ok to change to BID

## 2018-11-14 NOTE — Telephone Encounter (Signed)
Sent to the pharmacy by e-scribe. 

## 2018-11-30 ENCOUNTER — Other Ambulatory Visit: Payer: Self-pay | Admitting: Endocrinology

## 2018-11-30 DIAGNOSIS — M85852 Other specified disorders of bone density and structure, left thigh: Secondary | ICD-10-CM

## 2018-11-30 DIAGNOSIS — M85851 Other specified disorders of bone density and structure, right thigh: Secondary | ICD-10-CM

## 2018-12-07 ENCOUNTER — Other Ambulatory Visit: Payer: Self-pay | Admitting: Obstetrics and Gynecology

## 2018-12-07 DIAGNOSIS — Z1231 Encounter for screening mammogram for malignant neoplasm of breast: Secondary | ICD-10-CM

## 2018-12-11 ENCOUNTER — Other Ambulatory Visit: Payer: Self-pay

## 2018-12-11 ENCOUNTER — Ambulatory Visit (INDEPENDENT_AMBULATORY_CARE_PROVIDER_SITE_OTHER): Payer: Medicare Other

## 2018-12-11 DIAGNOSIS — Z23 Encounter for immunization: Secondary | ICD-10-CM

## 2019-01-07 ENCOUNTER — Other Ambulatory Visit: Payer: Self-pay | Admitting: Adult Health

## 2019-01-07 DIAGNOSIS — F329 Major depressive disorder, single episode, unspecified: Secondary | ICD-10-CM

## 2019-01-07 DIAGNOSIS — F419 Anxiety disorder, unspecified: Secondary | ICD-10-CM

## 2019-01-07 DIAGNOSIS — F32A Depression, unspecified: Secondary | ICD-10-CM

## 2019-01-09 NOTE — Telephone Encounter (Signed)
Sent to the pharmacy by e-scribe. 

## 2019-01-23 ENCOUNTER — Ambulatory Visit
Admission: RE | Admit: 2019-01-23 | Discharge: 2019-01-23 | Disposition: A | Discharge status: Home / Self Care | Payer: Medicare Other | Source: Ambulatory Visit | Attending: Obstetrics and Gynecology | Admitting: Obstetrics and Gynecology

## 2019-01-23 ENCOUNTER — Other Ambulatory Visit: Payer: Self-pay

## 2019-01-23 DIAGNOSIS — Z1231 Encounter for screening mammogram for malignant neoplasm of breast: Secondary | ICD-10-CM

## 2019-01-24 ENCOUNTER — Other Ambulatory Visit: Payer: Self-pay | Admitting: Endocrinology

## 2019-01-24 ENCOUNTER — Other Ambulatory Visit: Payer: Self-pay | Admitting: Adult Health

## 2019-01-24 DIAGNOSIS — N63 Unspecified lump in unspecified breast: Secondary | ICD-10-CM

## 2019-01-30 ENCOUNTER — Other Ambulatory Visit: Payer: Self-pay

## 2019-01-30 ENCOUNTER — Ambulatory Visit (INDEPENDENT_AMBULATORY_CARE_PROVIDER_SITE_OTHER)
Admission: RE | Admit: 2019-01-30 | Discharge: 2019-01-30 | Disposition: A | Discharge status: Home / Self Care | Payer: Medicare Other | Source: Ambulatory Visit | Attending: Endocrinology | Admitting: Endocrinology

## 2019-01-30 DIAGNOSIS — M85852 Other specified disorders of bone density and structure, left thigh: Secondary | ICD-10-CM | POA: Diagnosis not present

## 2019-01-30 DIAGNOSIS — M85851 Other specified disorders of bone density and structure, right thigh: Secondary | ICD-10-CM

## 2019-01-31 ENCOUNTER — Ambulatory Visit (INDEPENDENT_AMBULATORY_CARE_PROVIDER_SITE_OTHER): Payer: Medicare Other | Admitting: Endocrinology

## 2019-01-31 ENCOUNTER — Encounter: Payer: Self-pay | Admitting: Endocrinology

## 2019-01-31 VITALS — BP 140/66 | HR 72 | Ht 63.5 in | Wt 147.6 lb

## 2019-01-31 DIAGNOSIS — M85851 Other specified disorders of bone density and structure, right thigh: Secondary | ICD-10-CM | POA: Diagnosis not present

## 2019-01-31 DIAGNOSIS — M85852 Other specified disorders of bone density and structure, left thigh: Secondary | ICD-10-CM

## 2019-01-31 DIAGNOSIS — E559 Vitamin D deficiency, unspecified: Secondary | ICD-10-CM | POA: Diagnosis not present

## 2019-01-31 NOTE — Progress Notes (Signed)
Patient ID: Alexandria [name], female   DOB: 11-01-1940, 78 y.o.   MRN: [id number]          Referring PCP: Alexandria Peng, NP   Chief complaint: Follow-up of osteopenia  History of Present Illness:  She was initially seen in consultation for osteopenia in 07/2017  Patient had a fall on her concrete driveway in August 3419 and was found to have a partial compression of the T11 vertebra Because of continued pain she was treated with kyphoplasty on 06/15/2017 She also has a history of mild height loss  Previously on Arimidex for her breast cancer for 5 years unti 04/2017 She had a screening bone density done in 12/2016 and this showed the following T-scores; comparison was done with bone density from 2009:  Results:  Lumbar spine L1-L4 Femoral neck (FN)  T-score +1.3 RFN: -1.2 LFN: -1.5  Change in BMD from previous DXA test (%) -7.5%* -13.2%*    Previous treatment: Alendronate 70 mg weekly in 2014 for a year, not clear why this was stopped, no previous HRT  RECENT history:  She has not had any change in her low back pain which is somewhat chronic She says she has difficulty with balance at times but has not had a fall  RECLAST infusion in 7/19 which she tolerated well  Calcium supplements: None.  Stopped calcium supplement because of kidney stones  Vitamin D supplements: Vitamin D3 daily, 5000 units Vitamin D level is adequate   BONE DENSITY showed the following results from 01/30/2019:  Results:  Lumbar spine L1-L4 Femoral neck (FN) 33% distal radius  T-score 2.1 RFN: -1.0 LFN: -1.4 n/a  Change in BMD from previous DXA test (%) Up 6.6% Up 1.9% n/a     LABS:  Lab Results  Component Value Date   VD25OH 63.82 07/23/2018   VD25OH 54.31 11/29/2016     Past Medical History:  Diagnosis Date  . Allergy   . Anemia    hx of   . Anxiety   . Arthritis    left knee, hands, back  . Asthma    daily and prn inhalers  . 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 problems with balance  . Breast cancer ([facility]) 12/2011   right, ER+, PR -, Her 2 -  . COPD (chronic obstructive pulmonary disease) ([place])   . Degenerative joint disease of low back   . Depression   . Dysrhythmia    atrial fib   . GERD (gastroesophageal reflux disease)   . Hearing loss    bilateral hearing aids  . 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 History of kidney stones   . Hx of radiation therapy 03/12/12 -04/13/12   right breast  . Hyperlipidemia   . Hypertension    under control, has been on med. since age 67  . IBS (irritable bowel syndrome)   . Long term current use of aromatase inhibitor 05/08/2012  . Overactive bladder   . Palpitations   . Pneumonia    hx of x 2   . PONV (postoperative nausea and vomiting)   . PVD (peripheral vascular disease) ([facility])    varicose veins - left worse than right  . Spinal stenosis   . Stress incontinence   . 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 Traumatic hemopneumothorax 1982   left    Past Surgical History:  Procedure Laterality Date  .  ANKLE HARDWARE REMOVAL     right  . BREAST BIOPSY  1976   right  . BREAST BIOPSY Left 2019   benign lymph node  . BREAST LUMPECTOMY  2013   right with snbx  . CHOLECYSTECTOMY  1990s  . COLONOSCOPY    . CYSTOSCOPY  07/05/2005  . KNEE SURGERY  1980s   left  . KYPHOPLASTY N/A 06/15/2017   Procedure: KYPHOPLASTY T11;  Surgeon: Alexandria Schools, MD;  Location: [facility];  Service: Orthopedics;  Laterality: N/A;  90 mins  . ORIF ANKLE FRACTURE  1980s   right   . TOTAL KNEE ARTHROPLASTY Left 12/08/2015   Procedure: LEFT TOTAL KNEE ARTHROPLASTY;  Surgeon: Paralee Cancel, MD;  Location: [facility];  Service: Orthopedics;  Laterality: Left;  . TRANSOBTURATOR SLING  07/05/2005  . TUBAL LIGATION  1976  . VEIN SURGERY     vein ablation left leg    Family History  Problem Relation Age of Onset  . Cancer Maternal Aunt        breast   . Cancer Paternal Aunt        Multiple Myeloma  . Stomach cancer Paternal Uncle   . Breast cancer Paternal Uncle   . Breast cancer Cousin        early 79s  . Breast cancer Paternal Aunt        late 66s-60s  . Stroke Mother   . Heart attack Father        Died age 49  . Heart attack Paternal Grandfather   . Breast cancer Paternal Aunt        bilateral breast cancer  . Dementia Paternal Aunt   . Congestive Heart Failure Paternal Uncle        Ischemic  . Congestive Heart Failure Paternal Uncle        Ischemic    Social History:  reports that she quit smoking about 25 years ago. Her smoking use included cigarettes. She has a 7.50 pack-year smoking history. She has never used smokeless tobacco. She reports that she does not drink alcohol or use drugs.  Allergies:  Allergies  Allergen Reactions  . Augmentin [Amoxicillin-Pot Clavulanate] Itching, Rash and Other (See Comments)    PT DEVELOPED SEVERE RASH INVOLVING MUCUS MEMBRANES or SKIN NECROSIS WITH PENICILLINS: #  #  #  YES  #  #  #    . Penicillins Hives and Other (See Comments)    Has patient had a PCN reaction causing immediate rash, facial/tongue/throat swelling, SOB or lightheadedness with hypotension: No HAS PT DEVELOPED SEVERE RASH INVOLVING MUCUS MEMBRANES or SKIN NECROSIS: #  #  #  YES  #  #  #   Has patient had a PCN reaction that required hospitalization: No Has patient had a PCN reaction occurring within the last 10 years: No   . Aspirin Hives  . Chlorhexidine Gluconate Rash  . Chocolate Hives and Other (See Comments)    RUNNY NOSE   . Coffee Bean Extract Hives and Other (See Comments)    RUNNY NOSE   . Ibuprofen Hives and Other (See Comments)    RUNNY NOSE  . Oxycodone Hcl Hives and Nausea And Vomiting  . Shrimp [Shellfish Allergy] Hives and Other (See Comments)    RUNNY NOSE   . Sulfonamide Derivatives Hives  . Lipitor [Atorvastatin Calcium]     Leg Pain/Joint Pain/Back Pain    Allergies as of 01/31/2019       Reactions   Augmentin [amoxicillin-pot Clavulanate]  Itching, Rash, Other (See Comments)   PT DEVELOPED SEVERE RASH INVOLVING MUCUS MEMBRANES or SKIN NECROSIS WITH PENICILLINS: #  #  #  YES  #  #  #     Penicillins Hives, Other (See Comments)   Has patient had a PCN reaction causing immediate rash, facial/tongue/throat swelling, SOB or lightheadedness with hypotension: No HAS PT DEVELOPED SEVERE RASH INVOLVING MUCUS MEMBRANES or SKIN NECROSIS: #  #  #  YES  #  #  #   Has patient had a PCN reaction that required hospitalization: No Has patient had a PCN reaction occurring within the last 10 years: No   Aspirin Hives   Chlorhexidine Gluconate Rash   Chocolate Hives, Other (See Comments)   RUNNY NOSE   Coffee Bean Extract Hives, Other (See Comments)   RUNNY NOSE   Ibuprofen Hives, Other (See Comments)   RUNNY NOSE   Oxycodone Hcl Hives, Nausea And Vomiting   Shrimp [shellfish Allergy] Hives, Other (See Comments)   RUNNY NOSE   Sulfonamide Derivatives Hives   Lipitor [atorvastatin Calcium]    Leg Pain/Joint Pain/Back Pain      Medication List       Accurate as of January 31, 2019  3:57 PM. If you have any questions, ask your nurse or doctor.        albuterol 108 (90 Base) MCG/ACT inhaler Commonly known as: Ventolin HFA INHALE 2 PUFFS INTO THE LUNGS EVERY 6 HOURS AS NEEDED FOR SHORTNESS OF BREATH AND WHEEZING.   ALIGN PO Take 1 tablet by mouth daily.   amLODipine 10 MG tablet Commonly known as: NORVASC Take 1 tablet (10 mg total) by mouth daily.   ARNICA EX Apply 1 application topically daily as needed (pain).   ASPERCREME EX Apply 1 application topically daily as needed (pain).   b complex vitamins tablet Take 1 tablet by mouth 2 (two) times daily. PLUS FOLIC ACID PLUS VIT. C   Biotin 5000 MCG Caps Take 5,000 mcg by mouth daily.   Capsaicin 0.025 % Pads Apply 1 application topically daily as needed (pain).   clopidogrel 75 MG tablet Commonly known as: PLAVIX  TAKE 1 TABLET BY MOUTH DAILY.   CO-ENZYME Q10-VITAMIN E PO Take 1 tablet by mouth daily.   escitalopram 10 MG tablet Commonly known as: LEXAPRO TAKE 1 TABLET ONCE DAILY.   hydrochlorothiazide 12.5 MG capsule Commonly known as: MICROZIDE Take 1 capsule (12.5 mg total) by mouth 2 (two) times daily.   loratadine 10 MG tablet Commonly known as: CLARITIN Take 10 mg by mouth daily as needed for allergies.   Menthol (Topical Analgesic) 16 % Gel Apply 1 application topically daily as needed (pain).   BIOFREEZE ROLL-ON EX Apply 1 application topically daily as needed (pain).   mometasone-formoterol 200-5 MCG/ACT Aero Commonly known as: DULERA Inhale 2 puffs into the lungs 2 (two) times daily.   ondansetron 4 MG disintegrating tablet Commonly known as: Zofran ODT Take 1 tablet (4 mg total) by mouth every 8 (eight) hours as needed for nausea or vomiting.   OSTEO BI-FLEX ADV TRIPLE [place] PO Take 1 tablet by mouth daily.   pantoprazole 40 MG tablet Commonly known as: PROTONIX TAKE 1 TABLET BY MOUTH DAILY.   potassium chloride SA 20 MEQ tablet Commonly known as: KLOR-CON Take 3 tablets (60 mEq total) by mouth daily.   ranitidine 150 MG tablet Commonly known as: ZANTAC Take 150 mg by mouth at bedtime as needed for heartburn.   Selenium 200 MCG  Caps Take 200 mcg by mouth 2 (two) times daily.   simvastatin 5 MG tablet Commonly known as: ZOCOR Take 1 tablet twice weekly.  **DUE FOR YEARLY VISIT**   SYSTANE OP Place 1 drop into both eyes daily as needed. Dry eyes   ThermaCare Neck/Wrist Misc Apply 1 patch topically daily as needed (pain).   traMADol 50 MG tablet Commonly known as: ULTRAM Take 1 tablet (50 mg total) by mouth every 6 (six) hours as needed. What changed: reasons to take this   Vitamin D-3 125 MCG (5000 UT) Tabs Take 5,000 Units by mouth daily.        Review of Systems  She has had an upper normal TSH level, asymptomatic  LABS:  No visits with  results within 1 Week(s) from this visit.  Latest known visit with results is:  Lab on 07/23/2018  Component Date Value Ref Range Status  . VITD 07/23/2018 63.82  30.00 - 100.00 ng/mL Final  . Sodium 07/23/2018 139  135 - 145 mEq/L Final  . Potassium 07/23/2018 4.2  3.5 - 5.1 mEq/L Final  . Chloride 07/23/2018 103  96 - 112 mEq/L Final  . CO2 07/23/2018 28  19 - 32 mEq/L Final  . Glucose, Bld 07/23/2018 86  70 - 99 mg/dL Final  . BUN 07/23/2018 17  6 - 23 mg/dL Final  . Creatinine, Ser 07/23/2018 0.85  0.40 - 1.20 mg/dL Final  . Calcium 07/23/2018 9.5  8.4 - 10.5 mg/dL Final  . 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 She has a history of vertebral fracture after a fall and also history of taking tamoxifen No recent height loss  She had Reclast infusion in 09/2017  This appears to have been effective with increased bone density including at the hip going up by about 2%  Since she has only osteoporosis she will take this every other year Discussed that she likely has bone fragility and needs to avoid falls  She has adequate vitamin D supplementation and will continue supplements, to follow-up with repeat levels on her next visit Calcium is normal  PLAN:  Follow-up in 1 year Continue vitamin D supplementations   Alexandria [name] 01/31/2019, 3:57 PM

## 2019-02-04 ENCOUNTER — Ambulatory Visit
Admission: RE | Admit: 2019-02-04 | Discharge: 2019-02-04 | Disposition: A | Discharge status: Home / Self Care | Payer: Medicare Other | Source: Ambulatory Visit | Attending: Adult Health | Admitting: Adult Health

## 2019-02-04 ENCOUNTER — Other Ambulatory Visit: Payer: Self-pay

## 2019-02-04 DIAGNOSIS — N63 Unspecified lump in unspecified breast: Secondary | ICD-10-CM

## 2019-04-11 ENCOUNTER — Ambulatory Visit: Payer: Medicare Other

## 2019-04-11 ENCOUNTER — Other Ambulatory Visit: Payer: Self-pay | Admitting: Adult Health

## 2019-04-11 DIAGNOSIS — K219 Gastro-esophageal reflux disease without esophagitis: Secondary | ICD-10-CM

## 2019-04-16 ENCOUNTER — Ambulatory Visit: Payer: Medicare Other

## 2019-04-19 ENCOUNTER — Ambulatory Visit: Payer: Medicare Other | Attending: Internal Medicine

## 2019-04-19 DIAGNOSIS — Z23 Encounter for immunization: Secondary | ICD-10-CM | POA: Insufficient documentation

## 2019-04-19 NOTE — Progress Notes (Signed)
   Covid-19 Vaccination Clinic  Name:  Alexandria [name]    MRN: [id number] DOB: [birth year]  04/19/2019  [name] was observed post Covid-19 immunization for 15 minutes without incidence. She was provided with Vaccine Information Sheet and instruction to access the V-Safe system.   [name] was instructed to call 911 with any severe reactions post vaccine: Marland Kitchen Difficulty breathing  . Swelling of your face and throat  . A fast heartbeat  . A bad rash all over your body  . Dizziness and weakness    Immunizations Administered    Name Date Dose VIS Date Route   Pfizer COVID-19 Vaccine 04/19/2019  4:40 PM 0.3 mL 02/22/2019 Intramuscular   Manufacturer: [place]   Lot: [id number]   [place]: [id number]

## 2019-05-14 ENCOUNTER — Ambulatory Visit: Payer: Medicare Other

## 2019-05-14 ENCOUNTER — Ambulatory Visit: Payer: Medicare Other | Attending: Internal Medicine

## 2019-05-14 DIAGNOSIS — Z23 Encounter for immunization: Secondary | ICD-10-CM | POA: Insufficient documentation

## 2019-05-14 NOTE — Progress Notes (Signed)
   Covid-19 Vaccination Clinic  Name:  Alexandria [name]    MRN: [id number] DOB: 04-15-1940  05/14/2019  Alexandria [name] was observed post Covid-19 immunization for 15 minutes without incident. She was provided with Vaccine Information Sheet and instruction to access the V-Safe system.   Alexandria [name] was instructed to call 911 with any severe reactions post vaccine: Marland Kitchen Difficulty breathing  . Swelling of face and throat  . A fast heartbeat  . A bad rash all over body  . Dizziness and weakness   Immunizations Administered    Name Date Dose VIS Date Route   Pfizer COVID-19 Vaccine 05/14/2019 12:18 PM 0.3 mL 02/22/2019 Intramuscular   Manufacturer: [place]   Lot: [id number]   [place]: [id number]

## 2019-05-30 ENCOUNTER — Ambulatory Visit: Payer: Medicare Other

## 2019-05-30 ENCOUNTER — Telehealth: Payer: Self-pay | Admitting: Adult Health

## 2019-05-30 NOTE — Telephone Encounter (Signed)
I need to know the names of the medications or she can contact her pharmacy and they can send a request for the medications that are needed.

## 2019-05-30 NOTE — Telephone Encounter (Signed)
Patient needs refills on all of her prescriptions.  [place]

## 2019-05-30 NOTE — Telephone Encounter (Signed)
Patient states she will contact the pharmacy when she is running low and have the information sent to [place] for refill.

## 2019-06-11 ENCOUNTER — Emergency Department (HOSPITAL_BASED_OUTPATIENT_CLINIC_OR_DEPARTMENT_OTHER): Payer: Medicare Other

## 2019-06-11 ENCOUNTER — Other Ambulatory Visit: Payer: Self-pay

## 2019-06-11 ENCOUNTER — Emergency Department (HOSPITAL_COMMUNITY)
Admission: EM | Admit: 2019-06-11 | Discharge: 2019-06-11 | Disposition: A | Discharge status: Home / Self Care | Payer: Medicare Other | Attending: Emergency Medicine | Admitting: Emergency Medicine

## 2019-06-11 ENCOUNTER — Ambulatory Visit: Payer: Self-pay

## 2019-06-11 ENCOUNTER — Encounter (HOSPITAL_COMMUNITY): Payer: Self-pay

## 2019-06-11 DIAGNOSIS — J449 Chronic obstructive pulmonary disease, unspecified: Secondary | ICD-10-CM | POA: Diagnosis not present

## 2019-06-11 DIAGNOSIS — Z79899 Other long term (current) drug therapy: Secondary | ICD-10-CM | POA: Insufficient documentation

## 2019-06-11 DIAGNOSIS — M79609 Pain in unspecified limb: Secondary | ICD-10-CM | POA: Diagnosis not present

## 2019-06-11 DIAGNOSIS — L538 Other specified erythematous conditions: Secondary | ICD-10-CM

## 2019-06-11 DIAGNOSIS — I82890 Acute embolism and thrombosis of other specified veins: Secondary | ICD-10-CM | POA: Diagnosis not present

## 2019-06-11 DIAGNOSIS — Z87891 Personal history of nicotine dependence: Secondary | ICD-10-CM | POA: Insufficient documentation

## 2019-06-11 DIAGNOSIS — Z96652 Presence of left artificial knee joint: Secondary | ICD-10-CM | POA: Diagnosis not present

## 2019-06-11 DIAGNOSIS — I10 Essential (primary) hypertension: Secondary | ICD-10-CM | POA: Diagnosis not present

## 2019-06-11 DIAGNOSIS — M7989 Other specified soft tissue disorders: Secondary | ICD-10-CM | POA: Diagnosis not present

## 2019-06-11 DIAGNOSIS — M79604 Pain in right leg: Secondary | ICD-10-CM | POA: Diagnosis present

## 2019-06-11 MED ORDER — ACETAMINOPHEN 325 MG PO TABS
650.0000 mg | ORAL_TABLET | Freq: Once | ORAL | Status: AC
  Administered 2019-06-11: 650 mg via ORAL
  Filled 2019-06-11: qty 2

## 2019-06-11 NOTE — Telephone Encounter (Signed)
Patient has arrived at ED.

## 2019-06-11 NOTE — ED Provider Notes (Signed)
[facility] DEPT Provider Note   CSN: [id number] Arrival date & time: 06/11/19  1550     History Chief Complaint  Patient presents with  . Leg Pain    Alexandria [name] is a 79 y.o. female with past medical history as listed below presents to emergency department today with chief complaint of progressively worsening right leg pain and swelling x1 week.  Patient states she first noticed a small red area on her right upper thigh x1 week ago while in the shower.  She states the area has gotten larger and now has a dull aching throbbing pain with movement.  She denies any pain at rest. She did not take any medications for symptoms prior to arrival.  She denies any recent fall, injury, long period of immobilization.  Patient does admit to shortness of breath.  She states she has chronic shortness of breath with her COPD and it has worsened over the last month with the frequent weather changes and her seasonal allergies.  She takes NyQuil with symptom relief. She denies fever, chills, cough, hemoptysis chest pain, lower extremity edema, abdominal pain, nausea vomiting, urinary symptoms, diarrhea. Patient is currently taking Plavix and reports compliance.  History provided by patient with additional history obtained from chart review.     Past Medical History:  Diagnosis Date  . Allergy   . Anemia    hx of   . Anxiety   . Arthritis    left knee, hands, back  . Asthma    daily and prn inhalers  . 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 problems with balance  . Breast cancer ([place]) 12/2011   right, ER+, PR -, Her 2 -  . COPD (chronic obstructive pulmonary disease) ([place])   . Degenerative joint disease of low back   . Depression   . Dysrhythmia    atrial fib   . GERD (gastroesophageal reflux disease)   . Hearing loss    bilateral hearing aids  . 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 History  of kidney stones   . Hx of radiation therapy 03/12/12 -04/13/12   right breast  . Hyperlipidemia   . Hypertension    under control, has been on med. since age 79  . IBS (irritable bowel syndrome)   . Long term current use of aromatase inhibitor 05/08/2012  . Overactive bladder   . Palpitations   . Pneumonia    hx of x 2   . PONV (postoperative nausea and vomiting)   . PVD (peripheral vascular disease) (H[facility])    varicose veins - left worse than right  . Spinal stenosis   . Stress incontinence   . Stroke [facility])    problem with expressive communicaiton  . Traumatic hemopneumothorax 1982   left    Patient Active Problem List   Diagnosis Date Noted  . S/P kyphoplasty 06/15/2017  . 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 Atrial fibrillation ([place]) 11/19/2015  . Anxiety and depression 07/09/2015  . Varicose veins of bilateral lower extremities with other complications 40/12/2723  . Essential hypertension, benign 12/08/2014  . Acute hemorrhagic cystitis 02/18/2014  . Stroke ([place]) 05/15/2012  . Spinal stenosis   . COPD (chronic obstructive pulmonary disease) ([place])   . Long term current use of aromatase inhibitor 05/08/2012  . Hx of radiation therapy   . Breast cancer of upper-inner quadrant of right female breast (  [place]) 12/23/2011  . Ataxia 06/23/2011  . Contact dermatitis and eczema due to plant 01/04/2011  . BACK PAIN 08/11/2009  . PALPITATIONS, RECURRENT 02/27/2009  . PERS HX TOBACCO USE PRESENTING HAZARDS HEALTH 12/17/2008  . Hyperlipidemia 02/26/2008  . ASTHMA 02/26/2008  . GERD 02/12/2007    Past Surgical History:  Procedure Laterality Date  . ANKLE HARDWARE REMOVAL     right  . BREAST BIOPSY  1976   right  . BREAST BIOPSY Left 2019   benign lymph node  . BREAST LUMPECTOMY  2013   right with snbx  . CHOLECYSTECTOMY  1990s  . COLONOSCOPY    . CYSTOSCOPY  07/05/2005  . KNEE SURGERY  1980s   left  . KYPHOPLASTY N/A 06/15/2017   Procedure: KYPHOPLASTY  T11;  Surgeon: Melina Schools, MD;  Location: [facility];  Service: Orthopedics;  Laterality: N/A;  90 mins  . ORIF ANKLE FRACTURE  1980s   right   . TOTAL KNEE ARTHROPLASTY Left 12/08/2015   Procedure: LEFT TOTAL KNEE ARTHROPLASTY;  Surgeon: Paralee Cancel, MD;  Location: [facility];  Service: Orthopedics;  Laterality: Left;  . TRANSOBTURATOR SLING  07/05/2005  . TUBAL LIGATION  1976  . VEIN SURGERY     vein ablation left leg     OB History   No obstetric history on file.     Family History  Problem Relation Age of Onset  . Cancer Maternal Aunt        breast  . Cancer Paternal Aunt        Multiple Myeloma  . Stomach cancer Paternal Uncle   . Breast cancer Paternal Uncle   . Breast cancer Cousin        early 56s  . Breast cancer Paternal Aunt        late 10s-60s  . Stroke Mother   . Heart attack Father        Died age 76  . Heart attack Paternal Grandfather   . Breast cancer Paternal Aunt        bilateral breast cancer  . Dementia Paternal Aunt   . Congestive Heart Failure Paternal Uncle        Ischemic  . Congestive Heart Failure Paternal Uncle        Ischemic    Social History   Tobacco Use  . Smoking status: Former Smoker    Packs/day: 0.50    Years: 15.00    Pack years: 7.50    Types: Cigarettes    Quit date: 08/28/1993    Years since quitting: 25.8  . Smokeless tobacco: Never Used  Substance Use Topics  . Alcohol use: No    Alcohol/week: 0.0 standard drinks  . Drug use: No    Home Medications Prior to Admission medications   Medication Sig Start Date End Date Taking? Authorizing Provider  albuterol (VENTOLIN HFA) 108 (90 Base) MCG/ACT inhaler INHALE 2 PUFFS INTO THE LUNGS EVERY 6 HOURS AS NEEDED FOR SHORTNESS OF BREATH AND WHEEZING. 05/30/18   Nafziger, Tommi Rumps, NP  amLODipine (NORVASC) 10 MG tablet Take 1 tablet (10 mg total) by mouth daily. 10/30/18 01/28/19  Nafziger, Tommi Rumps, NP  ARNICA EX Apply 1 application topically daily as needed (pain).    [provider]  b complex vitamins tablet Take 1 tablet by mouth 2 (two) times daily. PLUS FOLIC ACID PLUS VIT. C    [provider]  Biotin 5000 MCG CAPS Take 5,000 mcg by mouth daily.     [provider]  Capsaicin 0.025 % PADS Apply 1 application topically daily as needed (pain).    [provider]  Cholecalciferol (VITAMIN D-3) 5000 UNITS TABS Take 5,000 Units by mouth daily.     [provider]  clopidogrel (PLAVIX) 75 MG tablet TAKE 1 TABLET BY MOUTH DAILY. 07/11/18   Nafziger, Tommi Rumps, NP  CO-ENZYME Q10-VITAMIN E PO Take 1 tablet by mouth daily.    [provider]  escitalopram (LEXAPRO) 10 MG tablet TAKE 1 TABLET ONCE DAILY. 01/09/19   Nafziger, Tommi Rumps, NP  Heat Wraps [facility] NECK/WRIST) MISC Apply 1 patch topically daily as needed (pain).    [provider]  hydrochlorothiazide (MICROZIDE) 12.5 MG capsule Take 1 capsule (12.5 mg total) by mouth 2 (two) times daily. 11/14/18   Nafziger, Tommi Rumps, NP  loratadine (CLARITIN) 10 MG tablet Take 10 mg by mouth daily as needed for allergies.     [provider]  Menthol, Topical Analgesic, (BIOFREEZE ROLL-ON EX) Apply 1 application topically daily as needed (pain).    [provider]  Menthol, Topical Analgesic, 16 % GEL Apply 1 application topically daily as needed (pain).    [provider]  Misc Natural Products (OSTEO BI-FLEX ADV TRIPLE ST PO) Take 1 tablet by mouth daily.    [provider]  mometasone-formoterol (DULERA) 200-5 MCG/ACT AERO Inhale 2 puffs into the lungs 2 (two) times daily. 05/30/18   Nafziger, Tommi Rumps, NP  ondansetron (ZOFRAN ODT) 4 MG disintegrating tablet Take 1 tablet (4 mg total) by mouth every 8 (eight) hours as needed for nausea or vomiting. 06/15/17   Mayo, Darla Lesches, PA-C  pantoprazole (PROTONIX) 40 MG tablet TAKE 1 TABLET BY MOUTH DAILY. 04/12/19   Nafziger, Tommi Rumps, NP  Polyethyl Glycol-Propyl Glycol (SYSTANE OP) Place 1 drop into both eyes daily as  needed. Dry eyes    [provider]  potassium chloride SA (K-DUR) 20 MEQ tablet Take 3 tablets (60 mEq total) by mouth daily. 08/14/18   Nafziger, Tommi Rumps, NP  Probiotic Product (ALIGN PO) Take 1 tablet by mouth daily.     [provider]  ranitidine (ZANTAC) 150 MG tablet Take 150 mg by mouth at bedtime as needed for heartburn.    [provider]  Selenium 200 MCG CAPS Take 200 mcg by mouth 2 (two) times daily.     [provider]  simvastatin (ZOCOR) 5 MG tablet Take 1 tablet twice weekly.  **DUE FOR YEARLY VISIT** 10/30/18   Nafziger, Tommi Rumps, NP  traMADol (ULTRAM) 50 MG tablet Take 1 tablet (50 mg total) by mouth every 6 (six) hours as needed. Patient taking differently: Take 50 mg by mouth every 6 (six) hours as needed for severe pain.  04/17/17   Nicholas Lose, MD  Trolamine Salicylate (ASPERCREME EX) Apply 1 application topically daily as needed (pain).    [provider]    Allergies    Augmentin [amoxicillin-pot clavulanate], Penicillins, Aspirin, Chlorhexidine gluconate, Chocolate, Coffee bean extract, Ibuprofen, Oxycodone hcl, Shrimp [shellfish allergy], Sulfonamide derivatives, and Lipitor [atorvastatin calcium]  Review of Systems   Review of Systems  All other systems are reviewed and are negative for acute change except as noted in the HPI.  Physical Exam Updated Vital Signs BP (!) 142/88 (BP Location: Left Arm)   Pulse 81   Temp 98.1 F (36.7 C) (Oral)   Resp 18   Ht 5' 3.5" (1.613 m)   Wt 66.2 kg   SpO2 96%   BMI 25.44 kg/m   Physical  Exam Vitals and nursing note reviewed.  Constitutional:      General: She is not in acute distress.    Appearance: She is not ill-appearing.  HENT:     Head: Normocephalic and atraumatic.     Right Ear: Tympanic membrane and external ear normal.     Left Ear: Tympanic membrane and external ear normal.     Nose: Nose normal.     Mouth/Throat:     Mouth: Mucous membranes are moist.     Pharynx:  Oropharynx is clear.  Eyes:     General: No scleral icterus.       Right eye: No discharge.        Left eye: No discharge.     Extraocular Movements: Extraocular movements intact.     Conjunctiva/sclera: Conjunctivae normal.     Pupils: Pupils are equal, round, and reactive to light.  Neck:     Vascular: No JVD.  Cardiovascular:     Rate and Rhythm: Normal rate and regular rhythm.     Pulses: Normal pulses.          Radial pulses are 2+ on the right side and 2+ on the left side.     Heart sounds: Normal heart sounds.  Pulmonary:     Comments: Lungs clear to auscultation in all fields. Symmetric chest rise. No wheezing, rales, or rhonchi. Abdominal:     Comments: Abdomen is soft, non-distended, and non-tender in all quadrants. No rigidity, no guarding. No peritoneal signs.  Musculoskeletal:        General: Normal range of motion.     Cervical back: Normal range of motion.     Comments: 4x3 circular area of erythema on medial aspect of right thigh. Mildly tender to palpation. Know like structure felt with deep palpation.  Full ROM of right lower extremity. No edema. Neurovascularly intact distally. Compartments soft above and below affected joint. DP pulses 2+ bilaterally.   Skin:    General: Skin is warm and dry.     Capillary Refill: Capillary refill takes less than 2 seconds.  Neurological:     Mental Status: She is oriented to person, place, and time.     GCS: GCS eye subscore is 4. GCS verbal subscore is 5. GCS motor subscore is 6.     Comments: Fluent speech, no facial droop.  Psychiatric:        Behavior: Behavior normal.     ED Results / Procedures / Treatments   Labs (all labs ordered are listed, but only abnormal results are displayed) Labs Reviewed - No data to display  EKG None  Radiology VAS [place] LOWER EXTREMITY VENOUS (DVT) (ONLY [facility])  Result Date: 06/11/2019  Lower Venous DVTStudy Indications: Pain, Swelling, Palpable Cord, and Erythema.   Anticoagulation: Plavix. Performing Technologist: Antonieta Pert RDMS, RVT  Examination Guidelines: A complete evaluation includes B-mode imaging, spectral Doppler, color Doppler, and power Doppler as needed of all accessible portions of each vessel. Bilateral testing is considered an integral part of a complete examination. Limited examinations for reoccurring indications may be performed as noted. The reflux portion of the exam is performed with the patient in reverse Trendelenburg.  +---------+---------------+---------+-----------+----------+-----------------+ RIGHT    CompressibilityPhasicitySpontaneityPropertiesThrombus Aging    +---------+---------------+---------+-----------+----------+-----------------+ CFV      Full           Yes      Yes                                    +---------+---------------+---------+-----------+----------+-----------------+  SFJ      Full                                                           +---------+---------------+---------+-----------+----------+-----------------+ FV Prox  Full                                                           +---------+---------------+---------+-----------+----------+-----------------+ FV Mid   Full                                                           +---------+---------------+---------+-----------+----------+-----------------+ FV DistalFull                                                           +---------+---------------+---------+-----------+----------+-----------------+ PFV      Full                                                           +---------+---------------+---------+-----------+----------+-----------------+ POP      Full           Yes      Yes                                    +---------+---------------+---------+-----------+----------+-----------------+ PTV      Full                                                            +---------+---------------+---------+-----------+----------+-----------------+ PERO     Full                                                           +---------+---------------+---------+-----------+----------+-----------------+ GSV      Full                                                           +---------+---------------+---------+-----------+----------+-----------------+ varicose Partial  Age Indeterminate +---------+---------------+---------+-----------+----------+-----------------+   +----+---------------+---------+-----------+----------+--------------+ LEFTCompressibilityPhasicitySpontaneityPropertiesThrombus Aging +----+---------------+---------+-----------+----------+--------------+ CFV Full           Yes      Yes                                 +----+---------------+---------+-----------+----------+--------------+     Summary: RIGHT: - Findings consistent with acute superficial vein thrombosis involving the right varicosities or other superficial veins. - Findings consistent with age indeterminate superficial vein thrombosis involving the right superficial veins/varciosities. - There is no evidence of deep vein thrombosis in the lower extremity.  - No cystic structure found in the popliteal fossa.  LEFT: - No evidence of common femoral vein obstruction.  *See table(s) above for measurements and observations.    Preliminary     Procedures Procedures (including critical care time)  Medications Ordered in ED Medications  acetaminophen (TYLENOL) tablet 650 mg (has no administration in time range)    ED Course  I have reviewed the triage vital signs and the nursing notes.  Pertinent labs & imaging results that were available during my care of the patient were reviewed by me and considered in my medical decision making (see chart for details).    MDM Rules/Calculators/A&P                      Patient seen and examined.  Patient presents awake, alert, hemodynamically stable, afebrile, non toxic.  She is well-appearing, no acute distress.  On exam she does have an area of erythema on her right medial thigh.  No gross abscess, but there is a knot-like structure with deep palpation.  NVI distally. She ambulates with steady gait.  Lungs clear to auscultation all fields.  She has normal work of breathing.  No tachypnea or hypoxia.  She states she has history of COPD and when the weather changes it is common for her to have increasing shortness of breath especially with her history of seasonal allergies. Do not feel emergent chest ray or CTA is necessary at this time given her reassuring exam and vitals.   Ultrasound right lower extremity shows she is positive for acute superficial vein thrombosis involving the right varicosities or other superficial veins No signs of DVT.  Patient reports she has allergies to ibuprofen and aspirin with reaction of runny nose and hives. Findings and plan of care discussed with supervising physician Dr. Roderic Palau who agrees with plan for pain control with Tylenol and recommend warm compresses and Ace wrap for compression.  Recommend outpatient vascular follow up.  The patient appears reasonably screened and/or stabilized for discharge and I doubt any other medical condition or other [facility] requiring further screening, evaluation, or treatment in the ED at this time prior to discharge. The patient is safe for discharge with strict return precautions discussed.   Portions of this note were generated with [employer]. Dictation errors may occur despite best attempts at proofreading.   Final Clinical Impression(s) / ED Diagnoses Final diagnoses:  Superficial vein thrombosis    Rx / DC Orders ED Discharge Orders    None       Flint Melter 06/11/19 2149    Milton Ferguson, MD 06/11/19 2301

## 2019-06-11 NOTE — Progress Notes (Signed)
Right lower extremity venous duplex complete. Preliminary results given to provider @ 19:50. Please see CV Proc tab for preliminary results. Lita Mains- RDMS, RVT 8:05 PM  06/11/2019

## 2019-06-11 NOTE — ED Triage Notes (Signed)
Patient c/o right leg pain and swelling x 1 week.  Patient is currently on Plavix. Patient states she noted a knot on the lateral side of her right knee. Area is red and tender. Patient states a history of DVT.

## 2019-06-11 NOTE — Discharge Instructions (Addendum)
You have been seen today for leg pain. Please read and follow all provided instructions. Return to the emergency room for worsening condition or new concerning symptoms.    The ultrasound of your leg today shows you have a superficial vein thrombosis.   1. Medications:  Continue usual home medications Take medications as prescribed. Please review all of the medicines and only take them if you do not have an allergy to them.   2. Treatment: rest. Take tylenol for pain. Apply warm compresses to the area of redness.  3. Follow Up:  Please follow up with the on call vascular surgeon Dr. Scot Dock by scheduling an appointment as soon as possible for a visit. Call his office to set up an appointment.     It is also a possibility that you have an allergic reaction to any of the medicines that you have been prescribed - Everybody reacts differently to medications and while MOST people have no trouble with most medicines, you may have a reaction such as nausea, vomiting, rash, swelling, shortness of breath. If this is the case, please stop taking the medicine immediately and contact your physician.  ?

## 2019-06-11 NOTE — Telephone Encounter (Signed)
Incoming call from Patient with a complaint that R leg above, hard and painful to touch.   Knee is swollen hard red and hard.  Has a history of phlebitis and varicose veins.  Feels warm.  Reviewed protocol with Patient, recommended that Patient go to ED.  Patient states that she will go to [facility] ED .  Informed Patient that I would notify [name], NP.           Reason for Disposition . [1] Swelling is painful to touch AND [2] fever  Answer Assessment - Initial Assessment Questions 1. ONSET: "When did the swelling start?" (e.g., minutes, hours, days)    1.5 hours  2. LOCATION: "What part of the leg is swollen?"  "Are both legs swollen or just one leg right leg 3. SEVERITY: "How bad is the swelling?" (e.g., localized; mild, moderate, severe)  - Localized - small area of swelling localized to one leg  - MILD pedal edema - swelling limited to foot and ankle, pitting edema < 1/4 inch (6 mm) deep, rest and elevation eliminate most or all swelling  - MODERATE edema - swelling of lower leg to knee, pitting edema > 1/4 inch (6 mm) deep, rest and elevation only partially reduce swelling  - SEVERE edema - swelling extends above knee, facial or hand swelling present     moderate 4. REDNESS: "Does the swelling look red or infected?"    yes 5. PAIN: "Is the swelling painful to touch?" If so, ask: "How painful is it?"   (Scale 1-10; mild, moderate or severe)     5 6. FEVER: "Do you have a fever?" If so, ask: "What is it, how was it measured, and when did it start?"      Dont have fever but leg is feeling warm 7. CAUSE: "What do you think is causing the leg swelling?"     *No Answer* 8. MEDICAL HISTORY: "Do you have a history of heart failure, kidney disease, liver failure, or cancer?"     denies 9. RECURRENT SYMPTOM: "Have you had leg swelling before?" If so, ask: "When was the last time?" "What happened that time?"     denies 10. OTHER SYMPTOMS: "Do you have any other symptoms?"  (e.g., chest pain, difficulty breathing)       denies 11. PREGNANCY: "Is there any chance you are pregnant?" "When was your last menstrual period?"       na  Protocols used: LEG SWELLING AND EDEMA-A-AH

## 2019-06-12 ENCOUNTER — Other Ambulatory Visit: Payer: Self-pay | Admitting: Adult Health

## 2019-06-12 ENCOUNTER — Telehealth: Payer: Self-pay

## 2019-06-12 NOTE — Telephone Encounter (Signed)
Pt needs to f/u with [place] following her ED visit. She has seen Kellie Simmering in the past. Will have scheduling call her to set this up.

## 2019-06-13 NOTE — Telephone Encounter (Signed)
Sent to the pharmacy by e-scribe. 

## 2019-06-15 ENCOUNTER — Other Ambulatory Visit: Payer: Self-pay | Admitting: Adult Health

## 2019-06-18 ENCOUNTER — Telehealth (HOSPITAL_COMMUNITY): Payer: Self-pay

## 2019-06-18 NOTE — Telephone Encounter (Signed)
The above patient or their representative was contacted and gave the following answers to these questions:         Do you have any of the following symptoms?    NO  Fever                    Cough                   Shortness of breath  Do  you have any of the following other symptoms?    muscle pain         vomiting,        diarrhea        rash         weakness        red eye        abdominal pain         bruising          bruising or bleeding              joint pain           severe headache    Have you been in contact with someone who was or has been sick in the past 2 weeks?  NO  Yes                 Unsure                         Unable to assess   Does the person that you were in contact with have any of the following symptoms?   Cough         shortness of breath           muscle pain         vomiting,            diarrhea            rash            weakness           fever            red eye           abdominal pain           bruising  or  bleeding                joint pain                severe headache                 COMMENTS OR ACTION PLAN FOR THIS PATIENT:      PATIENT HAS BEEN FULLY VACCINATED/[facility] 

## 2019-06-18 NOTE — Telephone Encounter (Signed)
Sent to the pharmacy by e-scribe.  Pt has upcoming cpx. 

## 2019-06-19 ENCOUNTER — Encounter: Payer: Self-pay | Admitting: Vascular Surgery

## 2019-06-19 ENCOUNTER — Ambulatory Visit (INDEPENDENT_AMBULATORY_CARE_PROVIDER_SITE_OTHER): Payer: Medicare Other | Admitting: Vascular Surgery

## 2019-06-19 ENCOUNTER — Other Ambulatory Visit: Payer: Self-pay

## 2019-06-19 VITALS — BP 128/81 | HR 68 | Temp 98.0°F | Resp 18 | Ht 63.5 in | Wt 145.7 lb

## 2019-06-19 DIAGNOSIS — I872 Venous insufficiency (chronic) (peripheral): Secondary | ICD-10-CM

## 2019-06-19 DIAGNOSIS — I809 Phlebitis and thrombophlebitis of unspecified site: Secondary | ICD-10-CM | POA: Diagnosis not present

## 2019-06-19 NOTE — Progress Notes (Signed)
REASON FOR CONSULT:    Phlebitis right thigh.  The consult is requested by the emergency department.  ASSESSMENT & PLAN:   PHLEBITIS RIGHT THIGH: This patient has phlebitis in the right thigh.  She has induration over a superficial varicosity which is thrombosed.  We have discussed the importance of intermittent leg elevation and the proper positioning for this.  She does not not tolerate thigh-high stocking so we will get her fitted in a knee-high compression stockings with a gradient of 15 to 20 mmHg.  She knows to continue warm compresses and resume activity as tolerated.  I explained that this may take several months to resolve.  CHRONIC VENOUS INSUFFICIENCY: This patient has CEAP C3 venous disease.  She has significant symptoms related to this and we will see her back in 6 months with a formal reflux test of both legs.  I explained that we would not want to address any of her issues now with active phlebitis.  We have discussed the importance of intermittent leg elevation.  We discussed compression stockings, the importance of exercise and specifically walking and water aerobics.  I encouraged her to avoid prolonged sitting and standing.  She will call sooner if her symptoms progress.  Alexandria Mayo, MD Office: [phone]   HPI:   Alexandria [name] is a pleasant 79 y.o. female, who was in the emergency department last week with pain in her right thigh and was found to have phlebitis.  Duplex scan showed no evidence of DVT.  The patient has a had a long history of venous disease.  She had laser ablation of the right great saphenous vein in 2009 and also the small saphenous vein.  This was by Dr. Kellie Simmering.  She has had continued problems with varicosities with pain aching and swelling in both lower extremities which is aggravated by standing and relieved with elevation.  She has been wearing pression stockings but these are not knee-high.  She does not tolerate thigh-high compression  stockings.  She said no previous history of DVT.  Past Medical History:  Diagnosis Date  . Allergy   . Anemia    hx of   . Anxiety   . Arthritis    left knee, hands, back  . Asthma    daily and prn inhalers  . 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 problems with balance  . Breast cancer ([place]) 12/2011   right, ER+, PR -, Her 2 -  . COPD (chronic obstructive pulmonary disease) ([place])   . Degenerative joint disease of low back   . Depression   . Dysrhythmia    atrial fib   . GERD (gastroesophageal reflux disease)   . Hearing loss    bilateral hearing aids  . 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 History of kidney stones   . Hx of radiation therapy 03/12/12 -04/13/12   right breast  . Hyperlipidemia   . Hypertension    under control, has been on med. since age 42  . IBS (irritable bowel syndrome)   . Long term current use of aromatase inhibitor 05/08/2012  . Overactive bladder   . Palpitations   . Pneumonia    hx of x 2   . PONV (postoperative nausea and vomiting)   . PVD (peripheral vascular disease) ([facility])    varicose veins - left worse than right  . Spinal stenosis   . Stress incontinence   .  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 Traumatic hemopneumothorax 1982   left    Family History  Problem Relation Age of Onset  . Cancer Maternal Aunt        breast  . Cancer Paternal Aunt        Multiple Myeloma  . Stomach cancer Paternal Uncle   . Breast cancer Paternal Uncle   . Breast cancer Cousin        early 32s  . Breast cancer Paternal Aunt        late 13s-60s  . Stroke Mother   . Heart attack Father        Died age 44  . Heart attack Paternal Grandfather   . Breast cancer Paternal Aunt        bilateral breast cancer  . Dementia Paternal Aunt   . Congestive Heart Failure Paternal Uncle        Ischemic  . Congestive Heart Failure Paternal Uncle        Ischemic     SOCIAL HISTORY: Social History   Socioeconomic History  . Marital status: Widowed    Spouse name: Not on file  . Number of children: Not on file  . Years of education: Not on file  . Highest education level: Not on file  Occupational History  . Not on file  Tobacco Use  . Smoking status: Former Smoker    Packs/day: 0.50    Years: 15.00    Pack years: 7.50    Types: Cigarettes    Quit date: 08/28/1993    Years since quitting: 25.8  . Smokeless tobacco: Never Used  Substance and Sexual Activity  . Alcohol use: No    Alcohol/week: 0.0 standard drinks  . Drug use: No  . Sexual activity: Not Currently  Other Topics Concern  . Not on file  Social History Narrative   Widowed since 2014   Retired Scientist, product/process development    Son and daughter   Social Determinants of Health   Financial Resource Strain:   . Difficulty of Paying Living Expenses:   Food Insecurity:   . Worried About Charity fundraiser in the Last Year:   . Arboriculturist in the Last Year:   Transportation Needs:   . Film/video editor (Medical):   Marland Kitchen Lack of Transportation (Non-Medical):   Physical Activity:   . Days of Exercise per Week:   . Minutes of Exercise per Session:   Stress:   . Feeling of Stress :   Social Connections:   . Frequency of Communication with Friends and Family:   . Frequency of Social Gatherings with Friends and Family:   . Attends Religious Services:   . Active Member of Clubs or Organizations:   . Attends Archivist Meetings:   Marland Kitchen Marital Status:   Intimate Partner Violence:   . Fear of Current or Ex-Partner:   . Emotionally Abused:   Marland Kitchen Physically Abused:   . Sexually Abused:     Allergies  Allergen Reactions  . Augmentin [Amoxicillin-Pot Clavulanate] Itching, Rash and Other (See Comments)    PT DEVELOPED SEVERE RASH INVOLVING MUCUS MEMBRANES or SKIN NECROSIS WITH PENICILLINS: #  #  #  YES  #  #  #    . Penicillins Hives and Other (See Comments)    Has patient had a  PCN reaction causing immediate rash, facial/tongue/throat swelling, SOB or lightheadedness with hypotension: No HAS PT DEVELOPED SEVERE RASH INVOLVING  MUCUS MEMBRANES or SKIN NECROSIS: #  #  #  YES  #  #  #   Has patient had a PCN reaction that required hospitalization: No Has patient had a PCN reaction occurring within the last 10 years: No   . Aspirin Hives  . Chlorhexidine Gluconate Rash  . Chocolate Hives and Other (See Comments)    RUNNY NOSE   . Coffee Bean Extract Hives and Other (See Comments)    RUNNY NOSE   . Ibuprofen Hives and Other (See Comments)    RUNNY NOSE  . Oxycodone Hcl Hives and Nausea And Vomiting  . Shrimp [Shellfish Allergy] Hives and Other (See Comments)    RUNNY NOSE   . Sulfonamide Derivatives Hives  . Lipitor [Atorvastatin Calcium]     Leg Pain/Joint Pain/Back Pain    Current Outpatient Medications  Medication Sig Dispense Refill  . albuterol (VENTOLIN HFA) 108 (90 Base) MCG/ACT inhaler INHALE 2 PUFFS INTO THE LUNGS EVERY 6 HOURS AS NEEDED FOR SHORTNESS OF BREATH AND WHEEZING. 18 g 3  . ARNICA EX Apply 1 application topically daily as needed (pain).    Marland Kitchen b complex vitamins tablet Take 1 tablet by mouth 2 (two) times daily. PLUS FOLIC ACID PLUS VIT. C    . Biotin 5000 MCG CAPS Take 5,000 mcg by mouth daily.     . Capsaicin 0.025 % PADS Apply 1 application topically daily as needed (pain).    . Cholecalciferol (VITAMIN D-3) 5000 UNITS TABS Take 5,000 Units by mouth daily.     . clopidogrel (PLAVIX) 75 MG tablet TAKE 1 TABLET BY MOUTH DAILY. 90 tablet 0  . CO-ENZYME Q10-VITAMIN E PO Take 1 tablet by mouth daily.    Marland Kitchen escitalopram (LEXAPRO) 10 MG tablet TAKE 1 TABLET ONCE DAILY. 90 tablet 1  . Heat Wraps (THERMACARE NECK/WRIST) MISC Apply 1 patch topically daily as needed (pain).    . hydrochlorothiazide (MICROZIDE) 12.5 MG capsule Take 1 capsule (12.5 mg total) by mouth 2 (two) times daily. 180 capsule 1  . loratadine (CLARITIN) 10 MG tablet Take 10 mg by  mouth daily as needed for allergies.     . Menthol, Topical Analgesic, (BIOFREEZE ROLL-ON EX) Apply 1 application topically daily as needed (pain).    . Menthol, Topical Analgesic, 16 % GEL Apply 1 application topically daily as needed (pain).    . Misc Natural Products (OSTEO BI-FLEX ADV TRIPLE ST PO) Take 1 tablet by mouth daily.    . mometasone-formoterol (DULERA) 200-5 MCG/ACT AERO Inhale 2 puffs into the lungs 2 (two) times daily. 13 g 6  . ondansetron (ZOFRAN ODT) 4 MG disintegrating tablet Take 1 tablet (4 mg total) by mouth every 8 (eight) hours as needed for nausea or vomiting. 20 tablet 0  . pantoprazole (PROTONIX) 40 MG tablet TAKE 1 TABLET BY MOUTH DAILY. 90 tablet 0  . Polyethyl Glycol-Propyl Glycol (SYSTANE OP) Place 1 drop into both eyes daily as needed. Dry eyes    . potassium chloride SA (K-DUR) 20 MEQ tablet Take 3 tablets (60 mEq total) by mouth daily. 270 tablet 2  . Probiotic Product (ALIGN PO) Take 1 tablet by mouth daily.     . ranitidine (ZANTAC) 150 MG tablet Take 150 mg by mouth at bedtime as needed for heartburn.    . Selenium 200 MCG CAPS Take 200 mcg by mouth 2 (two) times daily.     . simvastatin (ZOCOR) 5 MG tablet TAKE (1) TABLET TWO TIMES A WEEK. 24  tablet 0  . traMADol (ULTRAM) 50 MG tablet Take 1 tablet (50 mg total) by mouth every 6 (six) hours as needed. (Patient taking differently: Take 50 mg by mouth every 6 (six) hours as needed for severe pain. ) 30 tablet   . Trolamine Salicylate (ASPERCREME EX) Apply 1 application topically daily as needed (pain).    Marland Kitchen amLODipine (NORVASC) 10 MG tablet Take 1 tablet (10 mg total) by mouth daily. 90 tablet 1   No current facility-administered medications for this visit.    REVIEW OF SYSTEMS:  '[X]'$  denotes positive finding, '[ ]'$  denotes negative finding Cardiac  Comments:  Chest pain or chest pressure: x   Shortness of breath upon exertion: x   Short of breath when lying flat: x   Irregular heart rhythm:          Vascular    Pain in calf, thigh, or hip brought on by ambulation: x   Pain in feet at night that wakes you up from your sleep:  x   Blood clot in your veins: x   Leg swelling:  x       Pulmonary    Oxygen at home:    Productive cough:  x   Wheezing:  x       Neurologic    Sudden weakness in arms or legs:     Sudden numbness in arms or legs:     Sudden onset of difficulty speaking or slurred speech:    Temporary loss of vision in one eye:     Problems with dizziness:         Gastrointestinal    Blood in stool:     Vomited blood:         Genitourinary    Burning when urinating:     Blood in urine:        Psychiatric    Major depression:         Hematologic    Bleeding problems:    Problems with blood clotting too easily:        Skin    Rashes or ulcers:        Constitutional    Fever or chills:     PHYSICAL EXAM:   Vitals:   06/19/19 0827  BP: 128/81  Pulse: 68  Resp: 18  Temp: 98 F (36.7 C)  SpO2: 97%  Weight: 145 lb 11.2 oz (66.1 kg)  Height: 5' 3.5" (1.613 m)    GENERAL: The patient is a well-nourished female, in no acute distress. The vital signs are documented above. CARDIAC: There is a regular rate and rhythm.  VASCULAR: I do not detect carotid bruits. She has palpable pedal pulses. She has varicose veins, spider veins, and reticular veins bilaterally. She has an area of active phlebitis in her medial right thigh.  There is induration with mild erythema.        PULMONARY: There is good air exchange bilaterally without wheezing or rales. ABDOMEN: Soft and non-tender with normal pitched bowel sounds.  MUSCULOSKELETAL: There are no major deformities or cyanosis. NEUROLOGIC: No focal weakness or paresthesias are detected. SKIN: There are no ulcers or rashes noted. PSYCHIATRIC: The patient has a normal affect.  DATA:    VENOUS DUPLEX: I have reviewed the venous duplex scan that was done on 06/12/2019.  This showed evidence of phlebitis and  varicose vein in her medial right thigh.  There was no evidence of DVT.

## 2019-06-20 ENCOUNTER — Other Ambulatory Visit: Payer: Self-pay | Admitting: *Deleted

## 2019-06-20 DIAGNOSIS — I872 Venous insufficiency (chronic) (peripheral): Secondary | ICD-10-CM

## 2019-07-10 ENCOUNTER — Other Ambulatory Visit: Payer: Self-pay

## 2019-07-10 ENCOUNTER — Encounter: Payer: Self-pay | Admitting: Adult Health

## 2019-07-10 ENCOUNTER — Ambulatory Visit (INDEPENDENT_AMBULATORY_CARE_PROVIDER_SITE_OTHER): Payer: Medicare Other | Admitting: Adult Health

## 2019-07-10 VITALS — BP 140/90 | Temp 98.2°F | Ht 63.5 in | Wt 145.0 lb

## 2019-07-10 DIAGNOSIS — M159 Polyosteoarthritis, unspecified: Secondary | ICD-10-CM

## 2019-07-10 DIAGNOSIS — I4891 Unspecified atrial fibrillation: Secondary | ICD-10-CM

## 2019-07-10 DIAGNOSIS — K219 Gastro-esophageal reflux disease without esophagitis: Secondary | ICD-10-CM | POA: Diagnosis not present

## 2019-07-10 DIAGNOSIS — F32A Depression, unspecified: Secondary | ICD-10-CM

## 2019-07-10 DIAGNOSIS — F329 Major depressive disorder, single episode, unspecified: Secondary | ICD-10-CM

## 2019-07-10 DIAGNOSIS — I10 Essential (primary) hypertension: Secondary | ICD-10-CM | POA: Diagnosis not present

## 2019-07-10 DIAGNOSIS — Z0000 Encounter for general adult medical examination without abnormal findings: Secondary | ICD-10-CM | POA: Diagnosis not present

## 2019-07-10 DIAGNOSIS — M8949 Other hypertrophic osteoarthropathy, multiple sites: Secondary | ICD-10-CM

## 2019-07-10 DIAGNOSIS — F419 Anxiety disorder, unspecified: Secondary | ICD-10-CM

## 2019-07-10 LAB — COMPREHENSIVE METABOLIC PANEL
ALT: 11 U/L (ref 0–35)
AST: 19 U/L (ref 0–37)
Albumin: 4.5 g/dL (ref 3.5–5.2)
Alkaline Phosphatase: 54 U/L (ref 39–117)
BUN: 15 mg/dL (ref 6–23)
CO2: 29 mEq/L (ref 19–32)
Calcium: 9.4 mg/dL (ref 8.4–10.5)
Chloride: 101 mEq/L (ref 96–112)
Creatinine, Ser: 0.91 mg/dL (ref 0.40–1.20)
GFR: 60 mL/min — ABNORMAL LOW (ref >60.00)
Glucose, Bld: 87 mg/dL (ref 70–99)
Potassium: 3.4 mEq/L — ABNORMAL LOW (ref 3.5–5.1)
Sodium: 140 mEq/L (ref 135–145)
Total Bilirubin: 1.1 mg/dL (ref 0.2–1.2)
Total Protein: 7 g/dL (ref 6.0–8.3)

## 2019-07-10 LAB — LIPID PANEL
Cholesterol: 246 mg/dL — ABNORMAL HIGH (ref 0–200)
HDL: 50.2 mg/dL (ref >39.00)
LDL Cholesterol: 159 mg/dL — ABNORMAL HIGH (ref 0–99)
NonHDL: 195.3
Total CHOL/HDL Ratio: 5
Triglycerides: 183 mg/dL — ABNORMAL HIGH (ref 0.0–149.0)
VLDL: 36.6 mg/dL (ref 0.0–40.0)

## 2019-07-10 LAB — TSH: TSH: 1.84 u[IU]/mL (ref 0.35–4.50)

## 2019-07-10 MED ORDER — ONDANSETRON 4 MG PO TBDP: 4.0000 mg | ORAL_TABLET | Freq: Three times a day (TID) | ORAL | 0 refills | Status: DC | PRN

## 2019-07-10 MED ORDER — ONDANSETRON 4 MG PO TBDP: 4.0000 mg | ORAL_TABLET | Freq: Three times a day (TID) | ORAL | 1 refills | Status: DC | PRN

## 2019-07-10 MED ORDER — PENNSAID 2 % EX SOLN: 1.0000 "application " | Freq: Three times a day (TID) | CUTANEOUS | 0 refills | Status: DC

## 2019-07-10 NOTE — Progress Notes (Signed)
Subjective:    Patient ID: Alexandria [name], female    DOB: May 06, 1940, 79 y.o.   MRN: [id number]  HPI Patient presents for yearly preventative medicine examination. 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  Essential Hypertension - takes Norvasc 5 mg daily  and HCTZ 12.5 mg BID.  She denies dizziness, lightheadedness, chest pain or shortness of breath.  BP Readings from Last 3 Encounters:  07/10/19 140/90  06/19/19 128/81  06/11/19 (!) 147/78   Hyperlipidemia - takes simvastatin 5 mg twice a week Lab Results  Component Value Date   CHOL 273 (H) 05/30/2018   HDL 73.50 05/30/2018   LDLCALC 174 (H) 05/30/2018   LDLDIRECT 183.5 12/17/2008   TRIG 131.0 05/30/2018   CHOLHDL 4 05/30/2018    Anxiety/Depression - takes Lexapro 10 mg and feels well controlled on this medication  History of Stroke and Afib -currently on Plavix 75 mg.  She was on metoprolol in the past but this was DC'd due to bradycardia.  She reports chest pain or shortness of breath.  COPD-controlled with Ventolin and Dulera  Osteopenia -is followed by endocrinology.  She had a fall on her concrete driveway in August 5726 and was found to have a partial compression of the T11  vertebrae.  Due to continued pain she was treated with kyphoplasty on 06/15/2017.  He was previously on our [place] for breast cancer was stopped after 5 years in February 2019.  She was recently treated with Fosamax 70 mg weekly in 2014 but this was only for a year and unsure why it was stopped.  She was started on Reclast in July 2019 and has tolerated well.  All immunizations and health maintenance protocols were reviewed with the patient and needed orders were placed. She is up to date on vaccinations   Appropriate screening laboratory values were ordered for the patient including screening of hyperlipidemia, renal function and hepatic function.  Medication reconciliation,  past medical history, social history, problem list and allergies were reviewed in detail with the patient  Goals were established with regard to weight loss, exercise, and  diet in compliance with medications. She continues to stay active with her yard work. She is trying to eat a heart healthy diet.   Denies any acute complaints today   Review of Systems  Constitutional: Negative.   HENT: Negative.   Eyes: Negative.   Respiratory: Negative.   Cardiovascular: Negative.   Gastrointestinal: Negative.   Endocrine: Negative.   Genitourinary: Negative.   Musculoskeletal: Positive for arthralgias.  Skin: Negative.   Allergic/Immunologic: Negative.   Neurological: Negative.   Hematological: Negative.   Psychiatric/Behavioral: Negative.    Past Medical History:  Diagnosis Date  . Allergy   . Anemia    hx of   . Anxiety   . Arthritis    left knee, hands, back  . Asthma    daily and prn inhalers  . Brainstem infarct, acute ([place])  03/2011   slight expressive aphasia, occ. problems with balance  . Breast cancer ([place]) 12/2011   right, ER+, PR -, Her 2 -  . COPD (chronic obstructive pulmonary disease) ([place])   . Degenerative joint disease of low back   . Depression   . Dysrhythmia    atrial fib   . GERD  (gastroesophageal reflux disease)   . Hearing loss    bilateral hearing aids  . 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 History of kidney stones   . Hx of radiation therapy 03/12/12 -04/13/12   right breast  . Hyperlipidemia   . Hypertension    under control, has been on med. since age 89  . IBS (irritable bowel syndrome)   . Long term current use of aromatase inhibitor 05/08/2012  . Overactive bladder   . Palpitations   . Pneumonia    hx of x 2   . PONV (postoperative nausea and vomiting)   . PVD (peripheral vascular disease) ([facility])    varicose veins - left worse than right  . Spinal stenosis   . Stress incontinence   . Stroke [facility])    problem with expressive communicaiton  . Traumatic hemopneumothorax 1982   left    Social History   Socioeconomic History  . Marital status: Widowed    Spouse name: Not on file  . Number of children: Not on file  . Years of education: Not on file  . Highest education level: Not on file  Occupational History  . Not on file  Tobacco Use  . Smoking status: Former Smoker    Packs/day: 0.50    Years: 15.00    Pack years: 7.50    Types: Cigarettes    Quit date: 08/28/1993    Years since quitting: 25.8  . Smokeless tobacco: Never Used  Substance and Sexual Activity  . Alcohol use: No    Alcohol/week: 0.0 standard drinks  . Drug use: No  . Sexual activity: Not Currently  Other Topics Concern  . Not on file  Social History Narrative   Widowed since 2014   Retired Scientist, product/process development    Son and daughter   Social Determinants of Health   Financial Resource Strain:   . Difficulty of Paying Living Expenses:   Food Insecurity:   . Worried About Charity fundraiser in the Last Year:   . Arboriculturist in the Last Year:   Transportation Needs:   . Film/video editor (Medical):   Marland Kitchen Lack of Transportation (Non-Medical):   Physical Activity:   . Days of Exercise per Week:   . Minutes of Exercise per  Session:   Stress:   . Feeling of Stress :   Social Connections:   . Frequency of Communication with Friends and Family:   . Frequency of Social Gatherings with Friends and Family:   . Attends Religious Services:   . Active Member of Clubs or Organizations:   . Attends Archivist Meetings:   Marland Kitchen Marital Status:   Intimate Partner Violence:   . Fear of Current or Ex-Partner:   . Emotionally Abused:   Marland Kitchen Physically Abused:   . Sexually Abused:     Past Surgical History:  Procedure Laterality Date  . ANKLE HARDWARE REMOVAL     right  . BREAST BIOPSY  1976   right  . BREAST BIOPSY Left 2019   benign lymph node  . BREAST LUMPECTOMY  2013   right with snbx  . CHOLECYSTECTOMY  1990s  . COLONOSCOPY    . CYSTOSCOPY  07/05/2005  . KNEE SURGERY  1980s   left  . KYPHOPLASTY N/A 06/15/2017   Procedure: KYPHOPLASTY T11;  Surgeon: Melina Schools, MD;  Location: [facility];  Service: Orthopedics;  Laterality: N/A;  90 mins  . ORIF ANKLE FRACTURE  1980s   right   . TOTAL KNEE ARTHROPLASTY Left 12/08/2015   Procedure: LEFT TOTAL KNEE ARTHROPLASTY;  Surgeon: Paralee Cancel, MD;  Location: [facility];  Service: Orthopedics;  Laterality: Left;  . TRANSOBTURATOR SLING  07/05/2005  . TUBAL LIGATION  1976  . VEIN SURGERY     vein ablation left leg    Family History  Problem Relation Age of Onset  . Cancer Maternal Aunt        breast  . Cancer Paternal Aunt        Multiple Myeloma  . Stomach cancer Paternal Uncle   . Breast cancer Paternal Uncle   . Breast cancer Cousin        early 81s  . Breast cancer Paternal Aunt        late 47s-60s  . Stroke Mother   . Heart attack Father        Died age 64  . Heart attack Paternal Grandfather   . Breast cancer Paternal Aunt        bilateral breast cancer  . Dementia Paternal Aunt   . Congestive Heart Failure Paternal Uncle        Ischemic  . Congestive Heart Failure Paternal Uncle        Ischemic    Allergies  Allergen Reactions  .  Augmentin [Amoxicillin-Pot Clavulanate] Itching, Rash and Other (See Comments)    PT DEVELOPED SEVERE RASH INVOLVING MUCUS MEMBRANES or SKIN NECROSIS WITH PENICILLINS: #  #  #  YES  #  #  #    . Penicillins Hives and Other (See Comments)    Has patient had a PCN reaction causing immediate rash, facial/tongue/throat swelling, SOB or lightheadedness with hypotension: No HAS PT DEVELOPED SEVERE RASH INVOLVING MUCUS MEMBRANES or SKIN NECROSIS: #  #  #  YES  #  #  #   Has patient had a PCN reaction that required hospitalization: No Has patient had a PCN reaction occurring within the last 10 years: No   . Aspirin Hives  . Chlorhexidine Gluconate Rash  . Chocolate Hives and Other (See Comments)    RUNNY NOSE   . Coffee Bean Extract Hives and Other (See Comments)    RUNNY NOSE   . Ibuprofen Hives and Other (See Comments)    RUNNY NOSE  . Oxycodone Hcl Hives and Nausea And Vomiting  . Shrimp [Shellfish Allergy] Hives and Other (See Comments)    RUNNY NOSE   . Sulfonamide Derivatives Hives  . Lipitor [Atorvastatin Calcium]     Leg Pain/Joint Pain/Back Pain    Current Outpatient Medications on File Prior to Visit  Medication Sig Dispense Refill  . albuterol (VENTOLIN HFA) 108 (90 Base) MCG/ACT inhaler INHALE 2 PUFFS INTO THE LUNGS EVERY 6 HOURS AS NEEDED FOR SHORTNESS OF BREATH AND WHEEZING. 18 g 3  . ARNICA EX Apply 1 application topically daily as needed (pain).    Marland Kitchen b complex vitamins tablet Take 1 tablet by mouth 2 (two) times daily. PLUS FOLIC ACID PLUS VIT. C    . Biotin 5000 MCG CAPS Take 5,000 mcg by mouth daily.     Marland Kitchen  Capsaicin 0.025 % PADS Apply 1 application topically daily as needed (pain).    . Cholecalciferol (VITAMIN D-3) 5000 UNITS TABS Take 5,000 Units by mouth daily.     . clopidogrel (PLAVIX) 75 MG tablet TAKE 1 TABLET BY MOUTH DAILY. 90 tablet 0  . CO-ENZYME Q10-VITAMIN E PO Take 1 tablet by mouth daily.    Marland Kitchen escitalopram (LEXAPRO) 10 MG tablet TAKE 1 TABLET ONCE DAILY.  90 tablet 1  . hydrochlorothiazide (MICROZIDE) 12.5 MG capsule Take 1 capsule (12.5 mg total) by mouth 2 (two) times daily. 180 capsule 1  . loratadine (CLARITIN) 10 MG tablet Take 10 mg by mouth daily as needed for allergies.     . Misc Natural Products (OSTEO BI-FLEX ADV TRIPLE ST PO) Take 1 tablet by mouth daily.    . mometasone-formoterol (DULERA) 200-5 MCG/ACT AERO Inhale 2 puffs into the lungs 2 (two) times daily. 13 g 6  . pantoprazole (PROTONIX) 40 MG tablet TAKE 1 TABLET BY MOUTH DAILY. 90 tablet 0  . Polyethyl Glycol-Propyl Glycol (SYSTANE OP) Place 1 drop into both eyes daily as needed. Dry eyes    . potassium chloride SA (K-DUR) 20 MEQ tablet Take 3 tablets (60 mEq total) by mouth daily. 270 tablet 2  . Probiotic Product (ALIGN PO) Take 1 tablet by mouth daily.     . Selenium 200 MCG CAPS Take 200 mcg by mouth 2 (two) times daily.     . simvastatin (ZOCOR) 5 MG tablet TAKE (1) TABLET TWO TIMES A WEEK. 24 tablet 0  . traMADol (ULTRAM) 50 MG tablet Take 1 tablet (50 mg total) by mouth every 6 (six) hours as needed. (Patient taking differently: Take 50 mg by mouth every 6 (six) hours as needed for severe pain. ) 30 tablet   . Trolamine Salicylate (ASPERCREME EX) Apply 1 application topically daily as needed (pain).    Marland Kitchen amLODipine (NORVASC) 10 MG tablet Take 1 tablet (10 mg total) by mouth daily. 90 tablet 1   No current facility-administered medications on file prior to visit.    BP 140/90   Temp 98.2 F (36.8 C)   Ht 5' 3.5" (1.613 m)   Wt 145 lb (65.8 kg)   BMI 25.28 kg/m       Objective:   Physical Exam Vitals and nursing note reviewed.  Constitutional:      General: She is not in acute distress.    Appearance: Normal appearance. She is well-developed. She is not ill-appearing.  HENT:     Head: Normocephalic and atraumatic.     Right Ear: Tympanic membrane, ear canal and external ear normal. There is no impacted cerumen.     Left Ear: Tympanic membrane, ear canal and  external ear normal. There is no impacted cerumen.     Nose: Nose normal. No congestion or rhinorrhea.     Mouth/Throat:     Mouth: Mucous membranes are moist.     Pharynx: Oropharynx is clear. No oropharyngeal exudate or posterior oropharyngeal erythema.  Eyes:     General: No scleral icterus.       Right eye: No discharge.        Left eye: No discharge.     Extraocular Movements: Extraocular movements intact.     Conjunctiva/sclera: Conjunctivae normal.     Pupils: Pupils are equal, round, and reactive to light.  Neck:     Thyroid: No thyromegaly.     Vascular: No carotid bruit.     Trachea: No tracheal  deviation.  Cardiovascular:     Rate and Rhythm: Normal rate and regular rhythm.     Pulses: Normal pulses.     Heart sounds: Normal heart sounds. No murmur. No friction rub. No gallop.   Pulmonary:     Effort: Pulmonary effort is normal. No respiratory distress.     Breath sounds: Normal breath sounds. No stridor. No wheezing, rhonchi or rales.  Chest:     Chest wall: No tenderness.  Abdominal:     General: Abdomen is flat. Bowel sounds are normal. There is no distension.     Palpations: Abdomen is soft. There is no mass.     Tenderness: There is no abdominal tenderness. There is no right CVA tenderness, left CVA tenderness, guarding or rebound.     Hernia: No hernia is present.  Musculoskeletal:        General: No swelling, tenderness, deformity or signs of injury. Normal range of motion.     Cervical back: Normal range of motion and neck supple.     Right lower leg: No edema.     Left lower leg: No edema.  Lymphadenopathy:     Cervical: No cervical adenopathy.  Skin:    General: Skin is warm and dry.     Capillary Refill: Capillary refill takes less than 2 seconds.     Coloration: Skin is not jaundiced or pale.     Findings: No bruising, erythema, lesion or rash.  Neurological:     General: No focal deficit present.     Mental Status: She is alert and oriented to  person, place, and time.     Cranial Nerves: No cranial nerve deficit.     Sensory: No sensory deficit.     Motor: No weakness.     Coordination: Coordination normal.     Gait: Gait normal.     Deep Tendon Reflexes: Reflexes normal.  Psychiatric:        Mood and Affect: Mood normal.        Behavior: Behavior normal.        Thought Content: Thought content normal.        Judgment: Judgment normal.       Assessment & Plan:  1. Routine general medical examination at a health care facility - Continue to stay active and eat a heart healthy diet  - Follow up in one year or sooner if needed - CBC with Differential/Platelet - Comprehensive metabolic panel - Lipid panel - TSH  2. Anxiety and depression - Continue with Lexapro  - CBC with Differential/Platelet - Comprehensive metabolic panel - Lipid panel - TSH  3. Atrial fibrillation, unspecified type ([place]) SR today. Continue with Plavix  - CBC with Differential/Platelet - Comprehensive metabolic panel - Lipid panel - TSH  4. Essential hypertension, benign - No change in medications  - CBC with Differential/Platelet - Comprehensive metabolic panel - Lipid panel - TSH  5. Gastroesophageal reflux disease, unspecified whether esophagitis present - Continue with Protonix. Will refill Zofran for when she has nausea  - CBC with Differential/Platelet - Comprehensive metabolic panel - Lipid panel - TSH - ondansetron (ZOFRAN ODT) 4 MG disintegrating tablet; Take 1 tablet (4 mg total) by mouth every 8 (eight) hours as needed for nausea or vomiting.  Dispense: 20 tablet; Refill: 1  6. Primary osteoarthritis involving multiple joints  - Diclofenac Sodium (PENNSAID) 2 % SOLN; Apply 1 application topically 3 (three) times daily.  Dispense: 112 g; Refill: 0   [name], NP

## 2019-07-10 NOTE — Patient Instructions (Signed)
It was great seeing you today   We will follow up with you regarding your blood work   Keep stay active and eat a heart healthy diet   Follow up in one year or sooner if needed

## 2019-07-11 ENCOUNTER — Telehealth: Payer: Self-pay | Admitting: Adult Health

## 2019-07-11 LAB — CBC WITH DIFFERENTIAL/PLATELET
Basophils Absolute: 0 10*3/uL (ref 0.0–0.1)
Basophils Relative: 0.4 % (ref 0.0–3.0)
Eosinophils Absolute: 0.1 10*3/uL (ref 0.0–0.7)
Eosinophils Relative: 1.1 % (ref 0.0–5.0)
HCT: 38.1 % (ref 36.0–46.0)
Hemoglobin: 12.8 g/dL (ref 12.0–15.0)
Lymphocytes Relative: 40.5 % (ref 12.0–46.0)
Lymphs Abs: 2.9 10*3/uL (ref 0.7–4.0)
MCHC: 33.7 g/dL (ref 30.0–36.0)
MCV: 88.1 fl (ref 78.0–100.0)
Monocytes Absolute: 0.6 10*3/uL (ref 0.1–1.0)
Monocytes Relative: 7.9 % (ref 3.0–12.0)
Neutro Abs: 3.6 10*3/uL (ref 1.4–7.7)
Neutrophils Relative %: 50.1 % (ref 43.0–77.0)
Platelets: 226 10*3/uL (ref 150.0–400.0)
RBC: 4.33 Mil/uL (ref 3.87–5.11)
RDW: 14.3 % (ref 11.5–15.5)
WBC: 7.2 10*3/uL (ref 4.0–10.5)

## 2019-07-11 MED ORDER — SIMVASTATIN 5 MG PO TABS: 10.0000 mg | ORAL_TABLET | ORAL | 1 refills | Status: DC

## 2019-07-11 MED ORDER — HYDROCHLOROTHIAZIDE 12.5 MG PO CAPS: 12.5000 mg | ORAL_CAPSULE | Freq: Two times a day (BID) | ORAL | 3 refills | Status: DC

## 2019-07-11 MED ORDER — POTASSIUM CHLORIDE CRYS ER 20 MEQ PO TBCR: 60.0000 meq | EXTENDED_RELEASE_TABLET | Freq: Every day | ORAL | 3 refills | Status: DC

## 2019-07-11 MED ORDER — ESCITALOPRAM OXALATE 10 MG PO TABS: ORAL_TABLET | ORAL | 1 refills | Status: DC

## 2019-07-11 MED ORDER — AMLODIPINE BESYLATE 10 MG PO TABS: 10.0000 mg | ORAL_TABLET | Freq: Every day | ORAL | 3 refills | Status: DC

## 2019-07-11 MED ORDER — PANTOPRAZOLE SODIUM 40 MG PO TBEC: 40.0000 mg | DELAYED_RELEASE_TABLET | Freq: Every day | ORAL | 3 refills | Status: DC

## 2019-07-11 NOTE — Telephone Encounter (Signed)
Updated patient on her labs. Cholesterol panel continues to be elevated. She is having no myalgia with simvastatin 5 mg two times a week. Will increase to 10 mg twice a week and have her follow up in October for recheck

## 2019-07-17 ENCOUNTER — Telehealth: Payer: Self-pay | Admitting: Adult Health

## 2019-07-17 NOTE — Telephone Encounter (Signed)
Heather from [facility] is needing clarification on pt prescription Simvastatin.

## 2019-07-18 ENCOUNTER — Other Ambulatory Visit: Payer: Self-pay | Admitting: Adult Health

## 2019-07-18 MED ORDER — SIMVASTATIN 10 MG PO TABS: 10.0000 mg | ORAL_TABLET | ORAL | 3 refills | Status: DC

## 2019-07-18 NOTE — Telephone Encounter (Signed)
Taking 1 tab twice weekly?

## 2019-07-18 NOTE — Telephone Encounter (Signed)
Take one tablet of 10 mg twice a week. A new order was sent this morning

## 2019-07-19 NOTE — Telephone Encounter (Signed)
Spoke to [employer].  Nothing is required at this time.  Rx was received

## 2019-08-26 ENCOUNTER — Other Ambulatory Visit: Payer: Self-pay | Admitting: Adult Health

## 2019-08-26 DIAGNOSIS — J449 Chronic obstructive pulmonary disease, unspecified: Secondary | ICD-10-CM

## 2019-08-28 NOTE — Telephone Encounter (Signed)
Sent to the pharmacy by e-scribe. 

## 2019-10-01 ENCOUNTER — Other Ambulatory Visit (INDEPENDENT_AMBULATORY_CARE_PROVIDER_SITE_OTHER): Payer: Medicare Other

## 2019-10-01 ENCOUNTER — Other Ambulatory Visit: Payer: Self-pay

## 2019-10-01 DIAGNOSIS — E559 Vitamin D deficiency, unspecified: Secondary | ICD-10-CM | POA: Diagnosis not present

## 2019-10-01 DIAGNOSIS — M85852 Other specified disorders of bone density and structure, left thigh: Secondary | ICD-10-CM

## 2019-10-01 DIAGNOSIS — M85851 Other specified disorders of bone density and structure, right thigh: Secondary | ICD-10-CM | POA: Diagnosis not present

## 2019-10-01 LAB — BASIC METABOLIC PANEL
BUN: 15 mg/dL (ref 6–23)
CO2: 29 mEq/L (ref 19–32)
Calcium: 9.7 mg/dL (ref 8.4–10.5)
Chloride: 106 mEq/L (ref 96–112)
Creatinine, Ser: 0.87 mg/dL (ref 0.40–1.20)
GFR: 63.16 mL/min (ref >60.00)
Glucose, Bld: 107 mg/dL — ABNORMAL HIGH (ref 70–99)
Potassium: 4.5 mEq/L (ref 3.5–5.1)
Sodium: 140 mEq/L (ref 135–145)

## 2019-10-01 LAB — VITAMIN D 25 HYDROXY (VIT D DEFICIENCY, FRACTURES): VITD: 67.3 ng/mL (ref 30.00–100.00)

## 2019-10-03 ENCOUNTER — Encounter: Payer: Self-pay | Admitting: Endocrinology

## 2019-10-03 ENCOUNTER — Other Ambulatory Visit: Payer: Self-pay

## 2019-10-03 ENCOUNTER — Ambulatory Visit (INDEPENDENT_AMBULATORY_CARE_PROVIDER_SITE_OTHER): Payer: Medicare Other | Admitting: Endocrinology

## 2019-10-03 VITALS — BP 140/82 | HR 81 | Ht 63.5 in | Wt 146.0 lb

## 2019-10-03 DIAGNOSIS — M85852 Other specified disorders of bone density and structure, left thigh: Secondary | ICD-10-CM | POA: Diagnosis not present

## 2019-10-03 DIAGNOSIS — M85851 Other specified disorders of bone density and structure, right thigh: Secondary | ICD-10-CM

## 2019-10-03 NOTE — Progress Notes (Signed)
Patient ID: [name], female   DOB: Aug 05, 1940, 80 y.o.   MRN: [id number]          Referring PCP: [name], NP   Chief complaint: Follow-up of osteopenia  History of Present Illness:  She was initially seen in consultation for osteopenia in 07/2017  Patient had a fall on her concrete driveway in August 7829 and was found to have a partial compression of the T11 vertebra Because of continued pain she was treated with kyphoplasty on 06/15/2017 She also has a history of mild height loss  Previously on Arimidex for her breast cancer for 5 years unti 04/2017 She had a screening bone density done in 12/2016 and this showed the following T-scores; comparison was done with bone density from 2009:  Results from 10/18:  Lumbar spine L1-L4 Femoral neck (FN)  T-score +1.3 RFN: -1.2 LFN: -1.5  Change in BMD from previous DXA test (%) -7.5%* -13.2%*    Previous treatment: Alendronate 70 mg weekly in 2014 for a year, not clear why this was stopped, no previous HRT  RECENT history:  She has not had any change in her low back pain which is somewhat chronic She says she has difficulty with balance at times She had a fall 3 months ago but now fractured  Does not appear to have had any recent height loss  RECLAST infusion in 7/19 which she tolerated well  Calcium supplements: None.  Stopped calcium supplement because of kidney stones  Vitamin D supplements: Vitamin D3 daily, 5000 units Vitamin D level is adequate   BONE DENSITY showed the following results from 01/30/2019:  Results:  Lumbar spine L1-L4 Femoral neck (FN) 33% distal radius  T-score 2.1 RFN: -1.0 LFN: -1.4 n/a  Change in BMD from previous DXA test (%) Up 6.6% Up 1.9% n/a     LABS:  Lab Results  Component Value Date   VD25OH 67.30 10/01/2019   VD25OH 63.82 07/23/2018     Past Medical History:  Diagnosis Date  . Allergy   . Anemia    hx of   . Anxiety   . Arthritis    left knee, hands, back  .  Asthma    daily and prn inhalers  . 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 problems with balance  . Breast cancer ([place]) 12/2011   right, ER+, PR -, Her 2 -  . COPD (chronic obstructive pulmonary disease) ([place])   . Degenerative joint disease of low back   . Depression   . Dysrhythmia    atrial fib   . GERD (gastroesophageal reflux disease)   . Hearing loss    bilateral hearing aids  . 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 History of kidney stones   . Hx of radiation therapy 03/12/12 -04/13/12   right breast  . Hyperlipidemia   . Hypertension    under control, has been on med. since age 67  . IBS (irritable bowel syndrome)   . Long term current use of aromatase inhibitor 05/08/2012  . Overactive bladder   . Palpitations   . Pneumonia    hx of x 2   . PONV (postoperative nausea and vomiting)   . PVD (peripheral vascular disease) ([facility])    varicose veins - left worse than right  . Spinal stenosis   . Stress incontinence   . Stroke [facility])    problem with expressive communicaiton  .  Traumatic hemopneumothorax 1982   left    Past Surgical History:  Procedure Laterality Date  . ANKLE HARDWARE REMOVAL     right  . BREAST BIOPSY  1976   right  . BREAST BIOPSY Left 2019   benign lymph node  . BREAST LUMPECTOMY  2013   right with snbx  . CHOLECYSTECTOMY  1990s  . COLONOSCOPY    . CYSTOSCOPY  07/05/2005  . KNEE SURGERY  1980s   left  . KYPHOPLASTY N/A 06/15/2017   Procedure: KYPHOPLASTY T11;  Surgeon: Melina Schools, MD;  Location: [facility];  Service: Orthopedics;  Laterality: N/A;  90 mins  . ORIF ANKLE FRACTURE  1980s   right   . TOTAL KNEE ARTHROPLASTY Left 12/08/2015   Procedure: LEFT TOTAL KNEE ARTHROPLASTY;  Surgeon: Paralee Cancel, MD;  Location: [facility];  Service: Orthopedics;  Laterality: Left;  . TRANSOBTURATOR SLING  07/05/2005  . TUBAL LIGATION  1976  . VEIN SURGERY     vein ablation left leg     Family History  Problem Relation Age of Onset  . Cancer Maternal Aunt        breast  . Cancer Paternal Aunt        Multiple Myeloma  . Stomach cancer Paternal Uncle   . Breast cancer Paternal Uncle   . Breast cancer Cousin        early 22s  . Breast cancer Paternal Aunt        late 93s-60s  . Stroke Mother   . Heart attack Father        Died age 2  . Heart attack Paternal Grandfather   . Breast cancer Paternal Aunt        bilateral breast cancer  . Dementia Paternal Aunt   . Congestive Heart Failure Paternal Uncle        Ischemic  . Congestive Heart Failure Paternal Uncle        Ischemic    Social History:  reports that she quit smoking about 26 years ago. Her smoking use included cigarettes. She has a 7.50 pack-year smoking history. She has never used smokeless tobacco. She reports that she does not drink alcohol and does not use drugs.  Allergies:  Allergies  Allergen Reactions  . Augmentin [Amoxicillin-Pot Clavulanate] Itching, Rash and Other (See Comments)    PT DEVELOPED SEVERE RASH INVOLVING MUCUS MEMBRANES or SKIN NECROSIS WITH PENICILLINS: #  #  #  YES  #  #  #    . Penicillins Hives and Other (See Comments)    Has patient had a PCN reaction causing immediate rash, facial/tongue/throat swelling, SOB or lightheadedness with hypotension: No HAS PT DEVELOPED SEVERE RASH INVOLVING MUCUS MEMBRANES or SKIN NECROSIS: #  #  #  YES  #  #  #   Has patient had a PCN reaction that required hospitalization: No Has patient had a PCN reaction occurring within the last 10 years: No   . Aspirin Hives  . Chlorhexidine Gluconate Rash  . Chocolate Hives and Other (See Comments)    RUNNY NOSE   . Coffee Bean Extract Hives and Other (See Comments)    RUNNY NOSE   . Ibuprofen Hives and Other (See Comments)    RUNNY NOSE  . Oxycodone Hcl Hives and Nausea And Vomiting  . Shrimp [Shellfish Allergy] Hives and Other (See Comments)    RUNNY NOSE   . Sulfonamide Derivatives Hives   . Lipitor [Atorvastatin Calcium]     Leg Pain/Joint  Pain/Back Pain    Allergies as of 10/03/2019      Reactions   Augmentin [amoxicillin-pot Clavulanate] Itching, Rash, Other (See Comments)   PT DEVELOPED SEVERE RASH INVOLVING MUCUS MEMBRANES or SKIN NECROSIS WITH PENICILLINS: #  #  #  YES  #  #  #     Penicillins Hives, Other (See Comments)   Has patient had a PCN reaction causing immediate rash, facial/tongue/throat swelling, SOB or lightheadedness with hypotension: No HAS PT DEVELOPED SEVERE RASH INVOLVING MUCUS MEMBRANES or SKIN NECROSIS: #  #  #  YES  #  #  #   Has patient had a PCN reaction that required hospitalization: No Has patient had a PCN reaction occurring within the last 10 years: No   Aspirin Hives   Chlorhexidine Gluconate Rash   Chocolate Hives, Other (See Comments)   RUNNY NOSE   Coffee Bean Extract Hives, Other (See Comments)   RUNNY NOSE   Ibuprofen Hives, Other (See Comments)   RUNNY NOSE   Oxycodone Hcl Hives, Nausea And Vomiting   Shrimp [shellfish Allergy] Hives, Other (See Comments)   RUNNY NOSE   Sulfonamide Derivatives Hives   Lipitor [atorvastatin Calcium]    Leg Pain/Joint Pain/Back Pain      Medication List       Accurate as of October 03, 2019 11:59 PM. If you have any questions, ask your nurse or doctor.        albuterol 108 (90 Base) MCG/ACT inhaler Commonly known as: Ventolin HFA INHALE 2 PUFFS INTO THE LUNGS EVERY 6 HOURS AS NEEDED FOR SHORTNESS OF BREATH AND WHEEZING.   ALIGN PO Take 1 tablet by mouth daily.   amLODipine 10 MG tablet Commonly known as: NORVASC Take 1 tablet (10 mg total) by mouth daily.   ARNICA EX Apply 1 application topically daily as needed (pain).   ASPERCREME EX Apply 1 application topically daily as needed (pain).   b complex vitamins tablet Take 1 tablet by mouth 2 (two) times daily. PLUS FOLIC ACID PLUS VIT. C   Benadryl Allergy 25 MG tablet Generic drug: diphenhydrAMINE Take 25 mg by mouth every 6  (six) hours as needed. Patient taking 1/2 (12.5 mg) as needed for allergies and itching   Biotin 5000 MCG Caps Take 5,000 mcg by mouth daily.   Capsaicin 0.025 % Pads Apply 1 application topically daily as needed (pain).   clopidogrel 75 MG tablet Commonly known as: PLAVIX TAKE 1 TABLET BY MOUTH DAILY.   CO-ENZYME Q10-VITAMIN E PO Take 1 tablet by mouth daily.   Dulera 200-5 MCG/ACT Aero Generic drug: mometasone-formoterol USE 2 PUFFS TWICE DAILY.   escitalopram 10 MG tablet Commonly known as: LEXAPRO TAKE 1 TABLET ONCE DAILY.   hydrochlorothiazide 12.5 MG capsule Commonly known as: MICROZIDE Take 1 capsule (12.5 mg total) by mouth 2 (two) times daily.   loratadine 10 MG tablet Commonly known as: CLARITIN Take 10 mg by mouth daily as needed for allergies.   ondansetron 4 MG disintegrating tablet Commonly known as: Zofran ODT Take 1 tablet (4 mg total) by mouth every 8 (eight) hours as needed for nausea or vomiting.   OSTEO BI-FLEX ADV TRIPLE ST PO Take 1 tablet by mouth daily.   pantoprazole 40 MG tablet Commonly known as: PROTONIX Take 1 tablet (40 mg total) by mouth daily.   Pennsaid 2 % Soln Generic drug: Diclofenac Sodium Apply 1 application topically 3 (three) times daily.   potassium chloride SA 20 MEQ tablet Commonly known  as: KLOR-CON Take 3 tablets (60 mEq total) by mouth daily.   Selenium 200 MCG Caps Take 200 mcg by mouth 2 (two) times daily.   simvastatin 10 MG tablet Commonly known as: ZOCOR Take 1 tablet (10 mg total) by mouth 2 (two) times a week. TAKE (1) TABLET TWO TIMES A WEEK.   SYSTANE OP Place 1 drop into both eyes daily as needed. Dry eyes   traMADol 50 MG tablet Commonly known as: ULTRAM Take 1 tablet (50 mg total) by mouth every 6 (six) hours as needed. What changed: reasons to take this   Vitamin D-3 125 MCG (5000 UT) Tabs Take 5,000 Units by mouth daily.        Review of Systems  Previously TSH level had been upper  normal  Lab Results  Component Value Date   TSH 1.84 07/10/2019   TSH 4.21 05/30/2018   TSH 1.99 11/29/2016   FREET4 0.76 08/30/2011      LABS:  Lab on 10/01/2019  Component Date Value Ref Range Status  . VITD 10/01/2019 67.30  30.00 - 100.00 ng/mL Final  . Sodium 10/01/2019 140  135 - 145 mEq/L Final  . Potassium 10/01/2019 4.5  3.5 - 5.1 mEq/L Final  . Chloride 10/01/2019 106  96 - 112 mEq/L Final  . CO2 10/01/2019 29  19 - 32 mEq/L Final  . Glucose, Bld 10/01/2019 107* 70 - 99 mg/dL Final  . BUN 10/01/2019 15  6 - 23 mg/dL Final  . Creatinine, Ser 10/01/2019 0.87  0.40 - 1.20 mg/dL Final  . GFR 10/01/2019 63.16  >60.00 mL/min Final  . 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

## 2019-10-04 ENCOUNTER — Encounter: Payer: Self-pay | Admitting: Endocrinology

## 2019-10-07 ENCOUNTER — Telehealth: Payer: Self-pay

## 2019-10-07 NOTE — Telephone Encounter (Signed)
Called [facility] [employer] to verify coverage for Reclast.  Medication is a covered drug, and does not require PA. Pt has $200 deductible and has met this. Patient's procedure will be covered at 100%.

## 2019-10-11 NOTE — Telephone Encounter (Signed)
Patient returned call - provided information  [name], phone [phone], and instructed her to contact them to schedule her infusion.

## 2019-10-11 NOTE — Telephone Encounter (Signed)
Called pt to give her phone number for [facility], which is [phone], and instruct her to contact them to schedule her infusion.  Pt did not answer, and voicemail was left requesting a call back in order to give her this information.

## 2019-10-14 NOTE — Telephone Encounter (Signed)
Patient called stating she called the [facility] Stay center and they informed her that they did not have any orders for her for the reclast. Please advise.

## 2019-10-14 NOTE — Telephone Encounter (Signed)
Orders have been located within [facility] paperwork and appears they had not yet been faxed. I have faxed the orders today with confirmation received. Orders and fax confirmation have been placed on Noah's desk for our future review.

## 2019-11-01 ENCOUNTER — Encounter (HOSPITAL_COMMUNITY)
Admission: RE | Admit: 2019-11-01 | Discharge: 2019-11-01 | Disposition: A | Discharge status: Home / Self Care | Payer: Medicare Other | Source: Ambulatory Visit | Attending: Endocrinology | Admitting: Endocrinology

## 2019-11-01 DIAGNOSIS — M85851 Other specified disorders of bone density and structure, right thigh: Secondary | ICD-10-CM | POA: Diagnosis not present

## 2019-11-01 MED ORDER — ZOLEDRONIC ACID 5 MG/100ML IV SOLN
INTRAVENOUS | Status: AC
  Administered 2019-11-01: 5 mg via INTRAVENOUS
  Filled 2019-11-01: qty 100

## 2019-11-01 MED ORDER — ZOLEDRONIC ACID 5 MG/100ML IV SOLN: 5.0000 mg | Freq: Once | INTRAVENOUS | Status: AC

## 2019-11-07 ENCOUNTER — Other Ambulatory Visit: Payer: Self-pay | Admitting: Adult Health

## 2020-01-17 ENCOUNTER — Other Ambulatory Visit: Payer: Self-pay | Admitting: Adult Health

## 2020-01-17 DIAGNOSIS — Z1231 Encounter for screening mammogram for malignant neoplasm of breast: Secondary | ICD-10-CM

## 2020-02-10 ENCOUNTER — Other Ambulatory Visit: Payer: Self-pay | Admitting: Adult Health

## 2020-02-10 DIAGNOSIS — F32A Depression, unspecified: Secondary | ICD-10-CM

## 2020-02-13 ENCOUNTER — Encounter (HOSPITAL_COMMUNITY): Payer: Self-pay

## 2020-02-13 ENCOUNTER — Other Ambulatory Visit: Payer: Self-pay

## 2020-02-13 ENCOUNTER — Emergency Department (HOSPITAL_COMMUNITY): Payer: Medicare Other

## 2020-02-13 ENCOUNTER — Emergency Department (HOSPITAL_COMMUNITY)
Admission: EM | Admit: 2020-02-13 | Discharge: 2020-02-13 | Disposition: A | Discharge status: Home / Self Care | Payer: Medicare Other | Attending: Emergency Medicine | Admitting: Emergency Medicine

## 2020-02-13 DIAGNOSIS — J441 Chronic obstructive pulmonary disease with (acute) exacerbation: Secondary | ICD-10-CM | POA: Diagnosis not present

## 2020-02-13 DIAGNOSIS — I4891 Unspecified atrial fibrillation: Secondary | ICD-10-CM | POA: Insufficient documentation

## 2020-02-13 DIAGNOSIS — Z7902 Long term (current) use of antithrombotics/antiplatelets: Secondary | ICD-10-CM | POA: Insufficient documentation

## 2020-02-13 DIAGNOSIS — Z79899 Other long term (current) drug therapy: Secondary | ICD-10-CM | POA: Diagnosis not present

## 2020-02-13 DIAGNOSIS — Z20822 Contact with and (suspected) exposure to covid-19: Secondary | ICD-10-CM | POA: Diagnosis not present

## 2020-02-13 DIAGNOSIS — R059 Cough, unspecified: Secondary | ICD-10-CM | POA: Diagnosis present

## 2020-02-13 DIAGNOSIS — I10 Essential (primary) hypertension: Secondary | ICD-10-CM | POA: Insufficient documentation

## 2020-02-13 DIAGNOSIS — Z9012 Acquired absence of left breast and nipple: Secondary | ICD-10-CM | POA: Insufficient documentation

## 2020-02-13 DIAGNOSIS — J45909 Unspecified asthma, uncomplicated: Secondary | ICD-10-CM | POA: Insufficient documentation

## 2020-02-13 DIAGNOSIS — Z96652 Presence of left artificial knee joint: Secondary | ICD-10-CM | POA: Diagnosis not present

## 2020-02-13 DIAGNOSIS — Z87891 Personal history of nicotine dependence: Secondary | ICD-10-CM | POA: Insufficient documentation

## 2020-02-13 LAB — CBC WITH DIFFERENTIAL/PLATELET
Abs Immature Granulocytes: 0.02 10*3/uL (ref 0.00–0.07)
Basophils Absolute: 0.1 10*3/uL (ref 0.0–0.1)
Basophils Relative: 1 %
Eosinophils Absolute: 0.1 10*3/uL (ref 0.0–0.5)
Eosinophils Relative: 1 %
HCT: 38.4 % (ref 36.0–46.0)
Hemoglobin: 12.8 g/dL (ref 12.0–15.0)
Immature Granulocytes: 0 %
Lymphocytes Relative: 40 %
Lymphs Abs: 3.4 10*3/uL (ref 0.7–4.0)
MCH: 29.4 pg (ref 26.0–34.0)
MCHC: 33.3 g/dL (ref 30.0–36.0)
MCV: 88.3 fL (ref 80.0–100.0)
Monocytes Absolute: 0.6 10*3/uL (ref 0.1–1.0)
Monocytes Relative: 8 %
Neutro Abs: 4.3 10*3/uL (ref 1.7–7.7)
Neutrophils Relative %: 50 %
Platelets: 250 10*3/uL (ref 150–400)
RBC: 4.35 MIL/uL (ref 3.87–5.11)
RDW: 13.8 % (ref 11.5–15.5)
WBC: 8.4 10*3/uL (ref 4.0–10.5)
nRBC: 0 % (ref 0.0–0.2)

## 2020-02-13 LAB — COMPREHENSIVE METABOLIC PANEL
ALT: 14 U/L (ref 0–44)
AST: 21 U/L (ref 15–41)
Albumin: 4.5 g/dL (ref 3.5–5.0)
Alkaline Phosphatase: 39 U/L (ref 38–126)
Anion gap: 13 (ref 5–15)
BUN: 23 mg/dL (ref 8–23)
CO2: 23 mmol/L (ref 22–32)
Calcium: 9.8 mg/dL (ref 8.9–10.3)
Chloride: 104 mmol/L (ref 98–111)
Creatinine, Ser: 1.05 mg/dL — ABNORMAL HIGH (ref 0.44–1.00)
GFR, Estimated: 55 mL/min — ABNORMAL LOW (ref >60)
Glucose, Bld: 76 mg/dL (ref 70–99)
Potassium: 4.1 mmol/L (ref 3.5–5.1)
Sodium: 140 mmol/L (ref 135–145)
Total Bilirubin: 0.9 mg/dL (ref 0.3–1.2)
Total Protein: 7.2 g/dL (ref 6.5–8.1)

## 2020-02-13 LAB — BLOOD GAS, VENOUS
Acid-Base Excess: 2.9 mmol/L — ABNORMAL HIGH (ref 0.0–2.0)
Bicarbonate: 25.1 mmol/L (ref 20.0–28.0)
O2 Saturation: 78.2 %
Patient temperature: 98.6
pCO2, Ven: 31.6 mmHg — ABNORMAL LOW (ref 44.0–60.0)
pH, Ven: 7.51 — ABNORMAL HIGH (ref 7.250–7.430)
pO2, Ven: 40.4 mmHg (ref 32.0–45.0)

## 2020-02-13 LAB — RESP PANEL BY RT-PCR (FLU A&B, COVID) ARPGX2
Influenza A by PCR: NEGATIVE
Influenza B by PCR: NEGATIVE
SARS Coronavirus 2 by RT PCR: NEGATIVE

## 2020-02-13 LAB — TROPONIN I (HIGH SENSITIVITY): Troponin I (High Sensitivity): <2 ng/L (ref <18)

## 2020-02-13 LAB — BRAIN NATRIURETIC PEPTIDE: B Natriuretic Peptide: 48.7 pg/mL (ref 0.0–100.0)

## 2020-02-13 MED ORDER — METHYLPREDNISOLONE SODIUM SUCC 125 MG IJ SOLR
125.0000 mg | Freq: Once | INTRAMUSCULAR | Status: AC
  Administered 2020-02-13: 125 mg via INTRAVENOUS
  Filled 2020-02-13: qty 2

## 2020-02-13 MED ORDER — PREDNISONE 20 MG PO TABS: ORAL_TABLET | ORAL | 0 refills | Status: DC

## 2020-02-13 MED ORDER — ALBUTEROL SULFATE HFA 108 (90 BASE) MCG/ACT IN AERS
2.0000 | INHALATION_SPRAY | Freq: Once | RESPIRATORY_TRACT | Status: AC
  Administered 2020-02-13: 2 via RESPIRATORY_TRACT
  Filled 2020-02-13: qty 6.7

## 2020-02-13 MED ORDER — MAGNESIUM SULFATE 2 GM/50ML IV SOLN
2.0000 g | Freq: Once | INTRAVENOUS | Status: AC
  Administered 2020-02-13: 2 g via INTRAVENOUS
  Filled 2020-02-13: qty 50

## 2020-02-13 NOTE — ED Triage Notes (Signed)
Cough x 4 days. Room air sats wdl

## 2020-02-13 NOTE — ED Provider Notes (Signed)
[facility] DEPT Provider Note   CSN: [id number] Arrival date & time: 02/13/20  1714     History Chief Complaint  Patient presents with  . Cough    PAETYN PIETRZAK is a 79 y.o. female history of COPD, breast cancer, who presented with cough. Patient has been coughing for the last several months. Getting worse over the last 2 months. Patient did receive her Covid booster recently. Patient states that the cough is getting worse. She tried multiple cough medicines with no relief. Denies any sick contacts with Covid. Patient is not on steroids or albuterol currently. She states that she has history of COPD but does not wear oxygen at home.  The history is provided by the patient.       Past Medical History:  Diagnosis Date  . Allergy   . Anemia    hx of   . Anxiety   . Arthritis    left knee, hands, back  . Asthma    daily and prn inhalers  . 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 problems with balance  . Breast cancer ([place]) 12/2011   right, ER+, PR -, Her 2 -  . COPD (chronic obstructive pulmonary disease) ([place])   . Degenerative joint disease of low back   . Depression   . Dysrhythmia    atrial fib   . GERD (gastroesophageal reflux disease)   . Hearing loss    bilateral hearing aids  . 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 History of kidney stones   . Hx of radiation therapy 03/12/12 -04/13/12   right breast  . Hyperlipidemia   . Hypertension    under control, has been on med. since age 4  . IBS (irritable bowel syndrome)   . Long term current use of aromatase inhibitor 05/08/2012  . Overactive bladder   . Palpitations   . Pneumonia    hx of x 2   . PONV (postoperative nausea and vomiting)   . PVD (peripheral vascular disease) (H[facility])    varicose veins - left worse than right  . Spinal stenosis   . Stress incontinence   . Stroke [facility])    problem with expressive  communicaiton  . Traumatic hemopneumothorax 1982   left    Patient Active Problem List   Diagnosis Date Noted  . S/P kyphoplasty 06/15/2017  . 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 Atrial fibrillation ([place]) 11/19/2015  . Anxiety and depression 07/09/2015  . Varicose veins of bilateral lower extremities with other complications 67/34/1937  . Essential hypertension, benign 12/08/2014  . Acute hemorrhagic cystitis 02/18/2014  . Stroke ([place]) 05/15/2012  . Spinal stenosis   . COPD (chronic obstructive pulmonary disease) ([place])   . Long term current use of aromatase inhibitor 05/08/2012  . Hx of radiation therapy   . Breast cancer of upper-inner quadrant of right female breast ([place]) 12/23/2011  . Ataxia 06/23/2011  . Contact dermatitis and eczema due to plant 01/04/2011  . BACK PAIN 08/11/2009  . PALPITATIONS, RECURRENT 02/27/2009  . PERS HX TOBACCO USE PRESENTING HAZARDS HEALTH 12/17/2008  . Hyperlipidemia 02/26/2008  . ASTHMA 02/26/2008  . GERD 02/12/2007    Past Surgical History:  Procedure Laterality Date  . ANKLE HARDWARE REMOVAL     right  . BREAST BIOPSY  1976   right  . BREAST BIOPSY Left 2019   benign lymph node  .  BREAST LUMPECTOMY  2013   right with snbx  . CHOLECYSTECTOMY  1990s  . COLONOSCOPY    . CYSTOSCOPY  07/05/2005  . KNEE SURGERY  1980s   left  . KYPHOPLASTY N/A 06/15/2017   Procedure: KYPHOPLASTY T11;  Surgeon: Melina Schools, MD;  Location: [facility];  Service: Orthopedics;  Laterality: N/A;  90 mins  . ORIF ANKLE FRACTURE  1980s   right   . TOTAL KNEE ARTHROPLASTY Left 12/08/2015   Procedure: LEFT TOTAL KNEE ARTHROPLASTY;  Surgeon: Paralee Cancel, MD;  Location: [facility];  Service: Orthopedics;  Laterality: Left;  . TRANSOBTURATOR SLING  07/05/2005  . TUBAL LIGATION  1976  . VEIN SURGERY     vein ablation left leg     OB History   No obstetric history on file.     Family History  Problem Relation Age of Onset  . Cancer Maternal Aunt          breast  . Cancer Paternal Aunt        Multiple Myeloma  . Stomach cancer Paternal Uncle   . Breast cancer Paternal Uncle   . Breast cancer Cousin        early 72s  . Breast cancer Paternal Aunt        late 21s-60s  . Stroke Mother   . Heart attack Father        Died age 19  . Heart attack Paternal Grandfather   . Breast cancer Paternal Aunt        bilateral breast cancer  . Dementia Paternal Aunt   . Congestive Heart Failure Paternal Uncle        Ischemic  . Congestive Heart Failure Paternal Uncle        Ischemic    Social History   Tobacco Use  . Smoking status: Former Smoker    Packs/day: 0.50    Years: 15.00    Pack years: 7.50    Types: Cigarettes    Quit date: 08/28/1993    Years since quitting: 26.4  . Smokeless tobacco: Never Used  Vaping Use  . Vaping Use: Never used  Substance Use Topics  . Alcohol use: No    Alcohol/week: 0.0 standard drinks  . Drug use: No    Home Medications Prior to Admission medications   Medication Sig Start Date End Date Taking? Authorizing Provider  albuterol (VENTOLIN HFA) 108 (90 Base) MCG/ACT inhaler INHALE 2 PUFFS INTO THE LUNGS EVERY 6 HOURS AS NEEDED FOR SHORTNESS OF BREATH AND WHEEZING. 05/30/18   Nafziger, Tommi Rumps, NP  amLODipine (NORVASC) 10 MG tablet Take 1 tablet (10 mg total) by mouth daily. 07/11/19 10/09/19  Nafziger, Tommi Rumps, NP  ARNICA EX Apply 1 application topically daily as needed (pain).    [provider]  b complex vitamins tablet Take 1 tablet by mouth 2 (two) times daily. PLUS FOLIC ACID PLUS VIT. C    [provider]  Biotin 5000 MCG CAPS Take 5,000 mcg by mouth daily.     [provider]  Capsaicin 0.025 % PADS Apply 1 application topically daily as needed (pain).    [provider]  Cholecalciferol (VITAMIN D-3) 5000 UNITS TABS Take 5,000 Units by mouth daily.     [provider]  clopidogrel (PLAVIX) 75 MG tablet TAKE 1 TABLET BY MOUTH DAILY. 11/07/19   Nafziger,  Tommi Rumps, NP  CO-ENZYME Q10-VITAMIN E PO Take 1 tablet by mouth daily.    [provider]  Diclofenac Sodium (PENNSAID) 2 %  SOLN Apply 1 application topically 3 (three) times daily. 07/10/19   Nafziger, Tommi Rumps, NP  diphenhydrAMINE (BENADRYL ALLERGY) 25 MG tablet Take 25 mg by mouth every 6 (six) hours as needed. Patient taking 1/2 (12.5 mg) as needed for allergies and itching    [provider]  DULERA 200-5 MCG/ACT AERO USE 2 PUFFS TWICE DAILY. 08/28/19   Nafziger, Tommi Rumps, NP  escitalopram (LEXAPRO) 10 MG tablet TAKE 1 TABLET ONCE DAILY. 02/10/20   Nafziger, Tommi Rumps, NP  hydrochlorothiazide (MICROZIDE) 12.5 MG capsule Take 1 capsule (12.5 mg total) by mouth 2 (two) times daily. 07/11/19   Nafziger, Tommi Rumps, NP  loratadine (CLARITIN) 10 MG tablet Take 10 mg by mouth daily as needed for allergies.     [provider]  Misc Natural Products (OSTEO BI-FLEX ADV TRIPLE ST PO) Take 1 tablet by mouth daily.    [provider]  ondansetron (ZOFRAN ODT) 4 MG disintegrating tablet Take 1 tablet (4 mg total) by mouth every 8 (eight) hours as needed for nausea or vomiting. 07/10/19   Nafziger, Tommi Rumps, NP  pantoprazole (PROTONIX) 40 MG tablet Take 1 tablet (40 mg total) by mouth daily. 07/11/19   Nafziger, Tommi Rumps, NP  Polyethyl Glycol-Propyl Glycol (SYSTANE OP) Place 1 drop into both eyes daily as needed. Dry eyes    [provider]  potassium chloride SA (KLOR-CON) 20 MEQ tablet Take 3 tablets (60 mEq total) by mouth daily. 07/11/19   Nafziger, Tommi Rumps, NP  Probiotic Product (ALIGN PO) Take 1 tablet by mouth daily.     [provider]  Selenium 200 MCG CAPS Take 200 mcg by mouth 2 (two) times daily.     [provider]  simvastatin (ZOCOR) 10 MG tablet Take 1 tablet (10 mg total) by mouth 2 (two) times a week. TAKE (1) TABLET TWO TIMES A WEEK. 07/18/19 10/16/19  Nafziger, Tommi Rumps, NP  traMADol (ULTRAM) 50 MG tablet Take 1 tablet (50 mg total) by mouth every 6 (six) hours as  needed. Patient taking differently: Take 50 mg by mouth every 6 (six) hours as needed for severe pain.  04/17/17   Nicholas Lose, MD  Trolamine Salicylate (ASPERCREME EX) Apply 1 application topically daily as needed (pain).    [provider]    Allergies    Augmentin [amoxicillin-pot clavulanate], Penicillins, Aspirin, Chlorhexidine gluconate, Chocolate, Coffee bean extract, Ibuprofen, Oxycodone hcl, Shrimp [shellfish allergy], Sulfonamide derivatives, and Lipitor [atorvastatin calcium]  Review of Systems   Review of Systems  Respiratory: Positive for cough.   All other systems reviewed and are negative.   Physical Exam Updated Vital Signs BP (!) 143/72   Pulse 75   Temp 98.7 F (37.1 C) (Oral)   Resp 15   Wt 47.5 kg   SpO2 97%   BMI 18.26 kg/m   Physical Exam Vitals and nursing note reviewed.  Constitutional:      Comments: tachypneic   HENT:     Head: Normocephalic.     Nose: Nose normal.     Mouth/Throat:     Mouth: Mucous membranes are moist.  Eyes:     Extraocular Movements: Extraocular movements intact.     Pupils: Pupils are equal, round, and reactive to light.  Cardiovascular:     Rate and Rhythm: Normal rate and regular rhythm.     Pulses: Normal pulses.     Heart sounds: Normal heart sounds.  Pulmonary:     Comments: Tachypneic, moderate retractions  Abdominal:     General: Abdomen is  flat.     Palpations: Abdomen is soft.  Musculoskeletal:        General: Normal range of motion.     Cervical back: Normal range of motion and neck supple.     Comments: Trace edema bilaterally   Skin:    General: Skin is warm.     Capillary Refill: Capillary refill takes less than 2 seconds.  Neurological:     General: No focal deficit present.  Psychiatric:        Mood and Affect: Mood normal.     ED Results / Procedures / Treatments   Labs (all labs ordered are listed, but only abnormal results are displayed) Labs Reviewed  COMPREHENSIVE METABOLIC  PANEL - Abnormal; Notable for the following components:      Result Value   Creatinine, Ser 1.05 (*)    GFR, Estimated 55 (*)    All other components within normal limits  BLOOD GAS, VENOUS - Abnormal; Notable for the following components:   pH, Ven 7.510 (*)    pCO2, Ven 31.6 (*)    Acid-Base Excess 2.9 (*)    All other components within normal limits  RESP PANEL BY RT-PCR (FLU A&B, COVID) ARPGX2  CBC WITH DIFFERENTIAL/PLATELET  BRAIN NATRIURETIC PEPTIDE  TROPONIN I (HIGH SENSITIVITY)  TROPONIN I (HIGH SENSITIVITY)    EKG None  Radiology DG Chest Port 1 View  Result Date: 02/13/2020 CLINICAL DATA:  Cough for 4 days in a patient with a history of breast cancer melanoma. EXAM: PORTABLE CHEST 1 VIEW COMPARISON:  PA and lateral chest 10/06/2014. FINDINGS: The lungs are clear. Heart size is normal. No pneumothorax or pleural effusion. Aortic atherosclerosis. Surgical clips projecting over the right breast and chronic deformity of left ribs again seen. IMPRESSION: No acute disease. Aortic Atherosclerosis (ICD10-I70.0). Electronically Signed   By: Inge Rise M.D.   On: 02/13/2020 18:48    Procedures Procedures (including critical care time)  Medications Ordered in ED Medications  albuterol (VENTOLIN HFA) 108 (90 Base) MCG/ACT inhaler 2 puff (2 puffs Inhalation Given 02/13/20 1832)  methylPREDNISolone sodium succinate (SOLU-MEDROL) 125 mg/2 mL injection 125 mg (125 mg Intravenous Given 02/13/20 1832)  magnesium sulfate IVPB 2 g 50 mL (0 g Intravenous Stopped 02/13/20 1935)    ED Course  I have reviewed the triage vital signs and the nursing notes.  Pertinent labs & imaging results that were available during my care of the patient were reviewed by me and considered in my medical decision making (see chart for details).    MDM Rules/Calculators/A&P                         EMILE RINGGENBERG is a 79 y.o. female who presented with a cough. Likely COPD exacerbation. She did receive her  Covid booster but consider Covid as well. She has trace edema so consider CHF as well. Will get CBC and CMP and BNP and COVID and CXR. Will give albuterol and steroids and reassess.   9:53 PM Patient's Covid is negative and labs and x-rays were normal.  Patient is feeling better now.  Minimal wheezing on exam and no retractions now.  Likely COPD exacerbation.  Will discharge home with course of steroids and albuterol   Final Clinical Impression(s) / ED Diagnoses Final diagnoses:  None    Rx / DC Orders ED Discharge Orders    None       Drenda Freeze, MD 02/13/20 2154

## 2020-02-13 NOTE — Discharge Instructions (Signed)
Take prednisone as prescribed   Use albuterol every 4 hrs for cough and shortness of breath   See your doctor   Return to ER if you have worse shortness of breath, cough, fever

## 2020-02-20 ENCOUNTER — Telehealth: Payer: Self-pay

## 2020-02-20 ENCOUNTER — Telehealth (INDEPENDENT_AMBULATORY_CARE_PROVIDER_SITE_OTHER): Payer: Medicare Other | Admitting: Family Medicine

## 2020-02-20 ENCOUNTER — Other Ambulatory Visit: Payer: Self-pay

## 2020-02-20 ENCOUNTER — Encounter: Payer: Self-pay | Admitting: Family Medicine

## 2020-02-20 VITALS — Temp 98.0°F

## 2020-02-20 DIAGNOSIS — J441 Chronic obstructive pulmonary disease with (acute) exacerbation: Secondary | ICD-10-CM

## 2020-02-20 DIAGNOSIS — J020 Streptococcal pharyngitis: Secondary | ICD-10-CM

## 2020-02-20 DIAGNOSIS — J029 Acute pharyngitis, unspecified: Secondary | ICD-10-CM

## 2020-02-20 LAB — POCT RAPID STREP A (OFFICE): Rapid Strep A Screen: POSITIVE — AB

## 2020-02-20 MED ORDER — AZITHROMYCIN 250 MG PO TABS: ORAL_TABLET | ORAL | 0 refills | Status: DC

## 2020-02-20 NOTE — Progress Notes (Signed)
Virtual Visit via Video Note  I connected with Alexandria [name] on 02/20/20 at  2:00 PM EST by a video enabled telemedicine application 2/2 [id number] pandemic and verified that I am speaking with the correct person using two identifiers.  Location patient: home Location provider:work or home office Persons participating in the virtual visit: patient, provider  I discussed the limitations of evaluation and management by telemedicine and the availability of in person appointments. The patient expressed understanding and agreed to proceed.   HPI: Pt is a 79 yo female with pmh sig for h/o CVA, PVD, HTN, varicose veins, A.fib, COPD, IBS, GERD, arthritis, DJD, s/p TKR, kyphoplasty, h/o R breast cancer, stress incontinence who is followed by [name], NP.  Pt woke up with a sore throat. Has white patches on the back of her throat, ear pain, lightheadedness.  Seen in ED for COPD exacerbation 02/13/20, viral resp panel negative.  Pt notes being sick off and on since September.  Completed a prednisone taper a few days ago.  Pt notes phlegm was clear in ED, now yellow/green and thicker.  Drinking boost or Ensure.  Pt is a retired [employer] scientist.  ROS: See pertinent positives and negatives per HPI.  Past Medical History:  Diagnosis Date  . Allergy   . Anemia    hx of   . Anxiety   . Arthritis    left knee, hands, back  . Asthma    daily and prn inhalers  . 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 problems with balance  . Breast cancer ([place]) 12/2011   right, ER+, PR -, Her 2 -  . COPD (chronic obstructive pulmonary disease) ([place])   . Degenerative joint disease of low back   . Depression   . Dysrhythmia    atrial fib   . GERD (gastroesophageal reflux disease)   . Hearing loss    bilateral hearing aids  . 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 History of kidney stones   . Hx of radiation therapy 03/12/12 -04/13/12   right breast  .  Hyperlipidemia   . Hypertension    under control, has been on med. since age 39  . IBS (irritable bowel syndrome)   . Long term current use of aromatase inhibitor 05/08/2012  . Overactive bladder   . Palpitations   . Pneumonia    hx of x 2   . PONV (postoperative nausea and vomiting)   . PVD (peripheral vascular disease) ([facility])    varicose veins - left worse than right  . Spinal stenosis   . Stress incontinence   . Stroke [facility])    problem with expressive communicaiton  . Traumatic hemopneumothorax 1982   left    Past Surgical History:  Procedure Laterality Date  . ANKLE HARDWARE REMOVAL     right  . BREAST BIOPSY  1976   right  . BREAST BIOPSY Left 2019   benign lymph node  . BREAST LUMPECTOMY  2013   right with snbx  . CHOLECYSTECTOMY  1990s  . COLONOSCOPY    . CYSTOSCOPY  07/05/2005  . KNEE SURGERY  1980s   left  . KYPHOPLASTY N/A 06/15/2017   Procedure: KYPHOPLASTY T11;  Surgeon: Melina Schools, MD;  Location: [facility];  Service: Orthopedics;  Laterality: N/A;  90 mins  . ORIF ANKLE FRACTURE  1980s   right   . TOTAL KNEE ARTHROPLASTY Left 12/08/2015   Procedure: LEFT  TOTAL KNEE ARTHROPLASTY;  Surgeon: Paralee Cancel, MD;  [facility];  Service: Orthopedics;  Laterality: Left;  . TRANSOBTURATOR SLING  07/05/2005  . TUBAL LIGATION  1976  . VEIN SURGERY     vein ablation left leg    Family History  Problem Relation Age of Onset  . Cancer Maternal Aunt        breast  . Cancer Paternal Aunt        Multiple Myeloma  . Stomach cancer Paternal Uncle   . Breast cancer Paternal Uncle   . Breast cancer Cousin        early 60s  . Breast cancer Paternal Aunt        late 42s-60s  . Stroke Mother   . Heart attack Father        Died age 24  . Heart attack Paternal Grandfather   . Breast cancer Paternal Aunt        bilateral breast cancer  . Dementia Paternal Aunt   . Congestive Heart Failure Paternal Uncle        Ischemic  . Congestive Heart Failure Paternal Uncle         Ischemic     Current Outpatient Medications:  .  albuterol (VENTOLIN HFA) 108 (90 Base) MCG/ACT inhaler, INHALE 2 PUFFS INTO THE LUNGS EVERY 6 HOURS AS NEEDED FOR SHORTNESS OF BREATH AND WHEEZING., Disp: 18 g, Rfl: 3 .  amLODipine (NORVASC) 10 MG tablet, Take 1 tablet (10 mg total) by mouth daily., Disp: 90 tablet, Rfl: 3 .  ARNICA EX, Apply 1 application topically daily as needed (pain)., Disp: , Rfl:  .  b complex vitamins tablet, Take 1 tablet by mouth 2 (two) times daily. PLUS FOLIC ACID PLUS VIT. C, Disp: , Rfl:  .  Biotin 5000 MCG CAPS, Take 5,000 mcg by mouth daily. , Disp: , Rfl:  .  Capsaicin 0.025 % PADS, Apply 1 application topically daily as needed (pain)., Disp: , Rfl:  .  Cholecalciferol (VITAMIN D-3) 5000 UNITS TABS, Take 5,000 Units by mouth daily. , Disp: , Rfl:  .  clopidogrel (PLAVIX) 75 MG tablet, TAKE 1 TABLET BY MOUTH DAILY., Disp: 90 tablet, Rfl: 0 .  CO-ENZYME Q10-VITAMIN E PO, Take 1 tablet by mouth daily., Disp: , Rfl:  .  Diclofenac Sodium (PENNSAID) 2 % SOLN, Apply 1 application topically 3 (three) times daily., Disp: 112 g, Rfl: 0 .  diphenhydrAMINE (BENADRYL ALLERGY) 25 MG tablet, Take 25 mg by mouth every 6 (six) hours as needed. Patient taking 1/2 (12.5 mg) as needed for allergies and itching, Disp: , Rfl:  .  DULERA 200-5 MCG/ACT AERO, USE 2 PUFFS TWICE DAILY., Disp: 13 g, Rfl: 5 .  escitalopram (LEXAPRO) 10 MG tablet, TAKE 1 TABLET ONCE DAILY., Disp: 90 tablet, Rfl: 0 .  hydrochlorothiazide (MICROZIDE) 12.5 MG capsule, Take 1 capsule (12.5 mg total) by mouth 2 (two) times daily., Disp: 180 capsule, Rfl: 3 .  loratadine (CLARITIN) 10 MG tablet, Take 10 mg by mouth daily as needed for allergies. , Disp: , Rfl:  .  Misc Natural Products (OSTEO BI-FLEX ADV TRIPLE ST PO), Take 1 tablet by mouth daily., Disp: , Rfl:  .  ondansetron (ZOFRAN ODT) 4 MG disintegrating tablet, Take 1 tablet (4 mg total) by mouth every 8 (eight) hours as needed for nausea or vomiting.,  Disp: 20 tablet, Rfl: 1 .  pantoprazole (PROTONIX) 40 MG tablet, Take 1 tablet (40 mg total) by mouth daily., Disp: 90 tablet,  Rfl: 3 .  Polyethyl Glycol-Propyl Glycol (SYSTANE OP), Place 1 drop into both eyes daily as needed. Dry eyes, Disp: , Rfl:  .  potassium chloride SA (KLOR-CON) 20 MEQ tablet, Take 3 tablets (60 mEq total) by mouth daily., Disp: 270 tablet, Rfl: 3 .  predniSONE (DELTASONE) 20 MG tablet, Take 60 mg daily x 2 days then 40 mg daily x 2 days then 20 mg daily x 2 days, Disp: 12 tablet, Rfl: 0 .  Probiotic Product (ALIGN PO), Take 1 tablet by mouth daily. , Disp: , Rfl:  .  Selenium 200 MCG CAPS, Take 200 mcg by mouth 2 (two) times daily. , Disp: , Rfl:  .  simvastatin (ZOCOR) 10 MG tablet, Take 1 tablet (10 mg total) by mouth 2 (two) times a week. TAKE (1) TABLET TWO TIMES A WEEK., Disp: 48 tablet, Rfl: 3 .  traMADol (ULTRAM) 50 MG tablet, Take 1 tablet (50 mg total) by mouth every 6 (six) hours as needed. (Patient taking differently: Take 50 mg by mouth every 6 (six) hours as needed for severe pain. ), Disp: 30 tablet, Rfl:  .  Trolamine Salicylate (ASPERCREME EX), Apply 1 application topically daily as needed (pain)., Disp: , Rfl:   EXAM:  VITALS per patient if applicable: RR between 76-19 bpm  GENERAL: alert, oriented, appears well and in no acute distress  HEENT: atraumatic, conjunctiva clear, no obvious abnormalities on inspection of external nose and ears  NECK: normal movements of the head and neck  LUNGS: on inspection no signs of respiratory distress, breathing rate appears normal, no obvious gross SOB, gasping or wheezing  CV: no obvious cyanosis  MS: moves all visible extremities without noticeable abnormality  PSYCH/NEURO: pleasant and cooperative, no obvious depression or anxiety, speech and thought processing grossly intact  ASSESSMENT AND PLAN:  Discussed the following assessment and plan:  Strep pharyngitis  -rapid strep test positive -will send  in swab for viral cx to confirm. -will start z-pak - Plan: azithromycin (ZITHROMAX) 250 MG tablet, viral resp cx  COPD exacerbation (HCC)  - Plan: azithromycin (ZITHROMAX) 250 MG tablet  Sore throat  -supportive care including gargling with warm salt water  - Plan: POCT rapid strep A  F/u prn    I discussed the assessment and treatment plan with the patient. The patient was provided an opportunity to ask questions and all were answered. The patient agreed with the plan and demonstrated an understanding of the instructions.   The patient was advised to call back or seek an in-person evaluation if the symptoms worsen or if the condition fails to improve as anticipated.    Billie Ruddy, MD

## 2020-02-22 LAB — CULTURE, GROUP A STREP
MICRO NUMBER:: 11298111
SPECIMEN QUALITY:: ADEQUATE

## 2020-02-24 ENCOUNTER — Other Ambulatory Visit: Payer: Self-pay | Admitting: *Deleted

## 2020-02-24 DIAGNOSIS — I872 Venous insufficiency (chronic) (peripheral): Secondary | ICD-10-CM

## 2020-02-25 ENCOUNTER — Other Ambulatory Visit: Payer: Self-pay | Admitting: Adult Health

## 2020-02-25 NOTE — Telephone Encounter (Signed)
Can she receive a refill? Last office visit was 07/10/2019

## 2020-02-27 ENCOUNTER — Ambulatory Visit
Admission: RE | Admit: 2020-02-27 | Discharge: 2020-02-27 | Disposition: A | Discharge status: Home / Self Care | Payer: Medicare Other | Source: Ambulatory Visit | Attending: Adult Health | Admitting: Adult Health

## 2020-02-27 ENCOUNTER — Other Ambulatory Visit: Payer: Self-pay

## 2020-02-27 DIAGNOSIS — Z1231 Encounter for screening mammogram for malignant neoplasm of breast: Secondary | ICD-10-CM

## 2020-03-04 ENCOUNTER — Encounter: Payer: Self-pay | Admitting: Vascular Surgery

## 2020-03-04 ENCOUNTER — Ambulatory Visit (HOSPITAL_COMMUNITY)
Admission: RE | Admit: 2020-03-04 | Discharge: 2020-03-04 | Disposition: A | Discharge status: Home / Self Care | Payer: Medicare Other | Source: Ambulatory Visit | Attending: Vascular Surgery | Admitting: Vascular Surgery

## 2020-03-04 ENCOUNTER — Ambulatory Visit (INDEPENDENT_AMBULATORY_CARE_PROVIDER_SITE_OTHER): Payer: Medicare Other | Admitting: Vascular Surgery

## 2020-03-04 ENCOUNTER — Other Ambulatory Visit: Payer: Self-pay

## 2020-03-04 VITALS — BP 138/91 | HR 63 | Temp 97.8°F | Resp 20 | Ht 63.5 in | Wt 105.0 lb

## 2020-03-04 DIAGNOSIS — I872 Venous insufficiency (chronic) (peripheral): Secondary | ICD-10-CM

## 2020-03-04 NOTE — Progress Notes (Signed)
REASON FOR VISIT:   Follow-up of chronic venous insufficiency.  MEDICAL ISSUES:   CHRONIC VENOUS INSUFFICIENCY: This patient has some chronic venous insufficiency with deep venous reflux on the right.  Her symptoms are minimal.  I have encouraged her to continue to elevate her legs daily.  She faithfully wears her compression stockings.  We have also discussed importance of exercise and trying to avoid prolonged sitting and standing.  I will be happy to see her back at any time if her leg symptoms worsen or she develops worsening leg swelling.  However I reassured her that everything looks good on her duplex with no evidence of DVT and only a little bit of deep venous reflux on the right.  Her previous laser ablation of the great saphenous and small saphenous on the right were successful and these veins remain closed.  I will see her back as needed.   HPI:   Alexandria [name] is a pleasant 79 y.o. female who I saw in consultation on 06/19/2019 with chronic venous insufficiency.  She had phlebitis in the right thigh.  She had some induration over a superficial varicosity which was thrombosed.  We discussed importance of intermittent leg elevation, warm compresses, and compression therapy.  I felt this would take several months to resolve.  She had evidence of chronic venous insufficiency with CEAP C3 venous disease.  We discussed conservative measures.  She comes in for a routine follow-up visit.  Of note, patient has had previous laser ablation of the right great saphenous vein and small saphenous vein by Dr. Kellie Simmering.  Since I saw her last, she denies any significant leg pain.  She does have some occasional swelling.  She wears her compression stockings religiously.  She also has been elevating her legs above her heart.  She tries to stay fairly active.  She has had no recurrent episodes of phlebitis.  She denies chest pain or shortness of breath.  Past Medical History:  Diagnosis Date  . Allergy    . Anemia    hx of   . Anxiety   . Arthritis    left knee, hands, back  . Asthma    daily and prn inhalers  . 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 problems with balance  . Breast cancer ([facility]) 12/2011   right, ER+, PR -, Her 2 -  . COPD (chronic obstructive pulmonary disease) ([place])   . Degenerative joint disease of low back   . Depression   . Dysrhythmia    atrial fib   . GERD (gastroesophageal reflux disease)   . Hearing loss    bilateral hearing aids  . 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 History of kidney stones   . Hx of radiation therapy 03/12/12 -04/13/12   right breast  . Hyperlipidemia   . Hypertension    under control, has been on med. since age 56  . IBS (irritable bowel syndrome)   . Long term current use of aromatase inhibitor 05/08/2012  . Overactive bladder   . Palpitations   . Pneumonia    hx of x 2   . PONV (postoperative nausea and vomiting)   . PVD (peripheral vascular disease) ([facility])    varicose veins - left worse than right  . Spinal stenosis   . Stress incontinence   . 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 Traumatic hemopneumothorax 1982  left    Family History  Problem Relation Age of Onset  . Cancer Maternal Aunt        breast  . Cancer Paternal Aunt        Multiple Myeloma  . Stomach cancer Paternal Uncle   . Breast cancer Paternal Uncle   . Breast cancer Cousin        early 2s  . Breast cancer Paternal Aunt        late 48s-60s  . Stroke Mother   . Heart attack Father        Died age 68  . Heart attack Paternal Grandfather   . Breast cancer Paternal Aunt        bilateral breast cancer  . Dementia Paternal Aunt   . Congestive Heart Failure Paternal Uncle        Ischemic  . Congestive Heart Failure Paternal Uncle        Ischemic    SOCIAL HISTORY: Social History   Tobacco Use  . Smoking status: Former Smoker    Packs/day: 0.50     Years: 15.00    Pack years: 7.50    Types: Cigarettes    Quit date: 08/28/1993    Years since quitting: 26.5  . Smokeless tobacco: Never Used  Substance Use Topics  . Alcohol use: No    Alcohol/week: 0.0 standard drinks    Allergies  Allergen Reactions  . Augmentin [Amoxicillin-Pot Clavulanate] Itching, Rash and Other (See Comments)    PT DEVELOPED SEVERE RASH INVOLVING MUCUS MEMBRANES or SKIN NECROSIS WITH PENICILLINS: #  #  #  YES  #  #  #    . Penicillins Hives and Other (See Comments)    Has patient had a PCN reaction causing immediate rash, facial/tongue/throat swelling, SOB or lightheadedness with hypotension: No HAS PT DEVELOPED SEVERE RASH INVOLVING MUCUS MEMBRANES or SKIN NECROSIS: #  #  #  YES  #  #  #   Has patient had a PCN reaction that required hospitalization: No Has patient had a PCN reaction occurring within the last 10 years: No   . Aspirin Hives  . Chlorhexidine Gluconate Rash  . Chocolate Hives and Other (See Comments)    RUNNY NOSE   . Coffee Bean Extract Hives and Other (See Comments)    RUNNY NOSE   . Ibuprofen Hives and Other (See Comments)    RUNNY NOSE  . Oxycodone Hcl Hives and Nausea And Vomiting  . Shrimp [Shellfish Allergy] Hives and Other (See Comments)    RUNNY NOSE   . Sulfonamide Derivatives Hives  . Lipitor [Atorvastatin Calcium]     Leg Pain/Joint Pain/Back Pain    Current Outpatient Medications  Medication Sig Dispense Refill  . albuterol (VENTOLIN HFA) 108 (90 Base) MCG/ACT inhaler INHALE 2 PUFFS INTO THE LUNGS EVERY 6 HOURS AS NEEDED FOR SHORTNESS OF BREATH AND WHEEZING. 18 g 3  . ARNICA EX Apply 1 application topically daily as needed (pain).    Marland Kitchen b complex vitamins tablet Take 1 tablet by mouth 2 (two) times daily. PLUS FOLIC ACID PLUS VIT. C    . Biotin 5000 MCG CAPS Take 5,000 mcg by mouth daily.     . Capsaicin 0.025 % PADS Apply 1 application topically daily as needed (pain).    . Cholecalciferol (VITAMIN D-3) 5000 UNITS TABS  Take 5,000 Units by mouth daily.     . clopidogrel (PLAVIX) 75 MG tablet TAKE 1 TABLET BY MOUTH DAILY. 90 tablet 3  .  CO-ENZYME Q10-VITAMIN E PO Take 1 tablet by mouth daily.    . Diclofenac Sodium (PENNSAID) 2 % SOLN Apply 1 application topically 3 (three) times daily. 112 g 0  . diphenhydrAMINE (BENADRYL) 25 MG tablet Take 25 mg by mouth every 6 (six) hours as needed. Patient taking 1/2 (12.5 mg) as needed for allergies and itching    . DULERA 200-5 MCG/ACT AERO USE 2 PUFFS TWICE DAILY. 13 g 5  . escitalopram (LEXAPRO) 10 MG tablet TAKE 1 TABLET ONCE DAILY. 90 tablet 0  . hydrochlorothiazide (MICROZIDE) 12.5 MG capsule Take 1 capsule (12.5 mg total) by mouth 2 (two) times daily. 180 capsule 3  . loratadine (CLARITIN) 10 MG tablet Take 10 mg by mouth daily as needed for allergies.    . Misc Natural Products (OSTEO BI-FLEX ADV TRIPLE ST PO) Take 1 tablet by mouth daily.    . ondansetron (ZOFRAN ODT) 4 MG disintegrating tablet Take 1 tablet (4 mg total) by mouth every 8 (eight) hours as needed for nausea or vomiting. 20 tablet 1  . pantoprazole (PROTONIX) 40 MG tablet Take 1 tablet (40 mg total) by mouth daily. 90 tablet 3  . Polyethyl Glycol-Propyl Glycol (SYSTANE OP) Place 1 drop into both eyes daily as needed. Dry eyes    . potassium chloride SA (KLOR-CON) 20 MEQ tablet Take 3 tablets (60 mEq total) by mouth daily. 270 tablet 3  . predniSONE (DELTASONE) 20 MG tablet Take 60 mg daily x 2 days then 40 mg daily x 2 days then 20 mg daily x 2 days 12 tablet 0  . Probiotic Product (ALIGN PO) Take 1 tablet by mouth daily.    . Selenium 200 MCG CAPS Take 200 mcg by mouth 2 (two) times daily.     . traMADol (ULTRAM) 50 MG tablet Take 1 tablet (50 mg total) by mouth every 6 (six) hours as needed. (Patient taking differently: Take 50 mg by mouth every 6 (six) hours as needed for severe pain.) 30 tablet   . Trolamine Salicylate (ASPERCREME EX) Apply 1 application topically daily as needed (pain).    Marland Kitchen  amLODipine (NORVASC) 10 MG tablet Take 1 tablet (10 mg total) by mouth daily. 90 tablet 3  . simvastatin (ZOCOR) 10 MG tablet Take 1 tablet (10 mg total) by mouth 2 (two) times a week. TAKE (1) TABLET TWO TIMES A WEEK. 48 tablet 3   No current facility-administered medications for this visit.    REVIEW OF SYSTEMS:  $RemoveB'[X]'[id number]$  denotes positive finding, $RemoveBeforeDEI'[ ]'[id number]$  denotes negative finding Cardiac  Comments:  Chest pain or chest pressure:    Shortness of breath upon exertion: x   Short of breath when lying flat:    Irregular heart rhythm:        Vascular    Pain in calf, thigh, or hip brought on by ambulation:    Pain in feet at night that wakes you up from your sleep:     Blood clot in your veins:    Leg swelling:         Pulmonary    Oxygen at home:    Productive cough:     Wheezing:  x       Neurologic    Sudden weakness in arms or legs:     Sudden numbness in arms or legs:     Sudden onset of difficulty speaking or slurred speech:    Temporary loss of vision in one eye:     Problems with dizziness:  Gastrointestinal    Blood in stool:     Vomited blood:         Genitourinary    Burning when urinating:     Blood in urine:        Psychiatric    Major depression:         Hematologic    Bleeding problems:    Problems with blood clotting too easily:        Skin    Rashes or ulcers:        Constitutional    Fever or chills:     PHYSICAL EXAM:   Vitals:   03/04/20 1239  BP: (!) 138/91  Pulse: 63  Resp: 20  Temp: 97.8 F (36.6 C)  SpO2: 97%  Weight: 105 lb (47.6 kg)  Height: 5' 3.5" (1.613 m)    GENERAL: The patient is a well-nourished female, in no acute distress. The vital signs are documented above. CARDIAC: There is a regular rate and rhythm.  VASCULAR: I do not detect carotid bruits. She has palpable pedal pulses. She has some small varicose veins, reticular veins, and spider veins bilaterally. PULMONARY: There is good air exchange bilaterally without  wheezing or rales. ABDOMEN: Soft and non-tender with normal pitched bowel sounds.  MUSCULOSKELETAL: There are no major deformities or cyanosis. NEUROLOGIC: No focal weakness or paresthesias are detected. SKIN: There are no ulcers or rashes noted. PSYCHIATRIC: The patient has a normal affect.  DATA:    VENOUS DUPLEX: I have independently interpreted her venous duplex scan today.  On the right side there is no evidence of DVT or superficial venous thrombosis.  There is deep venous reflux involving the common femoral vein.  The great saphenous vein and small saphenous vein on the right have been ablated.  On the left side there is no evidence of DVT or superficial venous thrombosis.  There is no significant deep venous reflux.  The left great saphenous vein cannot be identified.  There are some chronic thrombus in the small saphenous vein with a small amount of reflux.   [name] [facility] [phone]

## 2020-03-18 ENCOUNTER — Encounter: Payer: Self-pay | Admitting: Family Medicine

## 2020-03-24 ENCOUNTER — Ambulatory Visit (INDEPENDENT_AMBULATORY_CARE_PROVIDER_SITE_OTHER): Payer: Medicare Other | Admitting: Pulmonary Disease

## 2020-03-24 ENCOUNTER — Encounter: Payer: Self-pay | Admitting: Pulmonary Disease

## 2020-03-24 ENCOUNTER — Other Ambulatory Visit: Payer: Self-pay

## 2020-03-24 VITALS — BP 124/78 | HR 71 | Temp 97.2°F | Ht 63.5 in | Wt 147.0 lb

## 2020-03-24 DIAGNOSIS — R0982 Postnasal drip: Secondary | ICD-10-CM

## 2020-03-24 DIAGNOSIS — J449 Chronic obstructive pulmonary disease, unspecified: Secondary | ICD-10-CM

## 2020-03-24 MED ORDER — FLUTICASONE PROPIONATE 50 MCG/ACT NA SUSP: 1.0000 | Freq: Every day | NASAL | 2 refills | Status: DC

## 2020-03-24 NOTE — Patient Instructions (Addendum)
Start flonase 1 spray per nostril each day  Take claritin 1 tab daily for allergies  Continue dulera daily and use albuterol as needed  We will schedule you for pulmonary function tests at your next follow up visit

## 2020-03-24 NOTE — Progress Notes (Signed)
Synopsis: Self referred in 03/2020 for COPD  Subjective:   PATIENT ID: Alexandria [name] GENDER: female DOB: [birth year], MRN: [id number]   HPI  Chief Complaint  Patient presents with  . Consult    Self referral for COPD. Per patient, was diagnosed with COPD back in the 80s. Was seen in the ED back in December 2021 for COPD exacerbation. States her breathing has slightly improved.    Alexandria [name] is a 80 year old woman, former smoker with history of asthma who is self-referred to pulmonary clinic for evaluation of COPD.    She was recently treated at the [facility] ER for COPD exacerbation. She was treated with steroids and albuterol nebulizer with improvement in her symptoms. She was discharged home with course of steroids and albuterol inhaler. She has been doing well since the ER visit. She has been using the albuterol 2-3 times per day since the ER. She also has dulera 200-72mcg 2 puffs twice daily.   She complains of sinus congestion and drainage along with heart burn symptoms. She is allergic to cats and has one at home. She has an 8 pack year history of smoking but grew up around second hand smoke as her parents smoked in the home.    Past Medical History:  Diagnosis Date  . Allergy   . Anemia    hx of   . Anxiety   . Arthritis    left knee, hands, back  . Asthma    daily and prn inhalers  . 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 problems with balance  . Breast cancer ([place]) 12/2011   right, ER+, PR -, Her 2 -  . COPD (chronic obstructive pulmonary disease) ([place])   . Degenerative joint disease of low back   . Depression   . Dysrhythmia    atrial fib   . GERD (gastroesophageal reflux disease)   . Hearing loss    bilateral hearing aids  . 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 History of kidney stones   . Hx of radiation therapy 03/12/12 -04/13/12   right breast  . Hyperlipidemia   . Hypertension     under control, has been on med. since age 47  . IBS (irritable bowel syndrome)   . Long term current use of aromatase inhibitor 05/08/2012  . Overactive bladder   . Palpitations   . Pneumonia    hx of x 2   . PONV (postoperative nausea and vomiting)   . PVD (peripheral vascular disease) ([facility])    varicose veins - left worse than right  . Spinal stenosis   . Stress incontinence   . Stroke [facility])    problem with expressive communicaiton  . Traumatic hemopneumothorax 1982   left     Family History  Problem Relation Age of Onset  . Cancer Maternal Aunt        breast  . Cancer Paternal Aunt        Multiple Myeloma  . Stomach cancer Paternal Uncle   . Breast cancer Paternal Uncle   . Breast cancer Cousin        early 87s  . Breast cancer Paternal Aunt        late 40s-60s  . Stroke Mother   . Heart attack Father        Died age 21  . Heart attack Paternal Grandfather   . Breast cancer Paternal Aunt  bilateral breast cancer  . Dementia Paternal Aunt   . Congestive Heart Failure Paternal Uncle        Ischemic  . Congestive Heart Failure Paternal Uncle        Ischemic     Social History   Socioeconomic History  . Marital status: Widowed    Spouse name: Not on file  . Number of children: Not on file  . Years of education: Not on file  . Highest education level: Not on file  Occupational History  . Not on file  Tobacco Use  . Smoking status: Former Smoker    Packs/day: 0.50    Years: 15.00    Pack years: 7.50    Types: Cigarettes    Quit date: 08/28/1993    Years since quitting: 26.5  . Smokeless tobacco: Never Used  Vaping Use  . Vaping Use: Never used  Substance and Sexual Activity  . Alcohol use: No    Alcohol/week: 0.0 standard drinks  . Drug use: No  . Sexual activity: Not Currently  Other Topics Concern  . Not on file  Social History Narrative   Widowed since 2014   Retired Scientist, product/process development    Son and daughter   Social Determinants of Health    Financial Resource Strain: Not on file  Food Insecurity: Not on file  Transportation Needs: Not on file  Physical Activity: Not on file  Stress: Not on file  Social Connections: Not on file  Intimate Partner Violence: Not on file     Allergies  Allergen Reactions  . Augmentin [Amoxicillin-Pot Clavulanate] Itching, Rash and Other (See Comments)    PT DEVELOPED SEVERE RASH INVOLVING MUCUS MEMBRANES or SKIN NECROSIS WITH PENICILLINS: #  #  #  YES  #  #  #    . Penicillins Hives and Other (See Comments)    Has patient had a PCN reaction causing immediate rash, facial/tongue/throat swelling, SOB or lightheadedness with hypotension: No HAS PT DEVELOPED SEVERE RASH INVOLVING MUCUS MEMBRANES or SKIN NECROSIS: #  #  #  YES  #  #  #   Has patient had a PCN reaction that required hospitalization: No Has patient had a PCN reaction occurring within the last 10 years: No   . Aspirin Hives  . Chlorhexidine Gluconate Rash  . Chocolate Hives and Other (See Comments)    RUNNY NOSE   . Coffee Bean Extract Hives and Other (See Comments)    RUNNY NOSE   . Ibuprofen Hives and Other (See Comments)    RUNNY NOSE  . Oxycodone Hcl Hives and Nausea And Vomiting  . Shrimp [Shellfish Allergy] Hives and Other (See Comments)    RUNNY NOSE   . Sulfonamide Derivatives Hives  . Lipitor [Atorvastatin Calcium]     Leg Pain/Joint Pain/Back Pain     Outpatient Medications Prior to Visit  Medication Sig Dispense Refill  . albuterol (VENTOLIN HFA) 108 (90 Base) MCG/ACT inhaler INHALE 2 PUFFS INTO THE LUNGS EVERY 6 HOURS AS NEEDED FOR SHORTNESS OF BREATH AND WHEEZING. 18 g 3  . ARNICA EX Apply 1 application topically daily as needed (pain).    Marland Kitchen b complex vitamins tablet Take 1 tablet by mouth 2 (two) times daily. PLUS FOLIC ACID PLUS VIT. C    . Biotin 5000 MCG CAPS Take 5,000 mcg by mouth daily.     . Capsaicin 0.025 % PADS Apply 1 application topically daily as needed (pain).    . Cholecalciferol (VITAMIN  D-3)  5000 UNITS TABS Take 5,000 Units by mouth daily.     . clopidogrel (PLAVIX) 75 MG tablet TAKE 1 TABLET BY MOUTH DAILY. 90 tablet 3  . CO-ENZYME Q10-VITAMIN E PO Take 1 tablet by mouth daily.    . Diclofenac Sodium (PENNSAID) 2 % SOLN Apply 1 application topically 3 (three) times daily. 112 g 0  . diphenhydrAMINE (BENADRYL) 25 MG tablet Take 25 mg by mouth every 6 (six) hours as needed. Patient taking 1/2 (12.5 mg) as needed for allergies and itching    . DULERA 200-5 MCG/ACT AERO USE 2 PUFFS TWICE DAILY. 13 g 5  . escitalopram (LEXAPRO) 10 MG tablet TAKE 1 TABLET ONCE DAILY. 90 tablet 0  . hydrochlorothiazide (MICROZIDE) 12.5 MG capsule Take 1 capsule (12.5 mg total) by mouth 2 (two) times daily. 180 capsule 3  . loratadine (CLARITIN) 10 MG tablet Take 10 mg by mouth daily as needed for allergies.    . Misc Natural Products (OSTEO BI-FLEX ADV TRIPLE ST PO) Take 1 tablet by mouth daily.    . ondansetron (ZOFRAN ODT) 4 MG disintegrating tablet Take 1 tablet (4 mg total) by mouth every 8 (eight) hours as needed for nausea or vomiting. 20 tablet 1  . pantoprazole (PROTONIX) 40 MG tablet Take 1 tablet (40 mg total) by mouth daily. 90 tablet 3  . Polyethyl Glycol-Propyl Glycol (SYSTANE OP) Place 1 drop into both eyes daily as needed. Dry eyes    . potassium chloride SA (KLOR-CON) 20 MEQ tablet Take 3 tablets (60 mEq total) by mouth daily. 270 tablet 3  . Probiotic Product (ALIGN PO) Take 1 tablet by mouth daily.    . Selenium 200 MCG CAPS Take 200 mcg by mouth 2 (two) times daily.     . traMADol (ULTRAM) 50 MG tablet Take 1 tablet (50 mg total) by mouth every 6 (six) hours as needed. (Patient taking differently: Take 50 mg by mouth every 6 (six) hours as needed for severe pain.) 30 tablet   . Trolamine Salicylate (ASPERCREME EX) Apply 1 application topically daily as needed (pain).    Marland Kitchen amLODipine (NORVASC) 10 MG tablet Take 1 tablet (10 mg total) by mouth daily. 90 tablet 3  . simvastatin (ZOCOR)  10 MG tablet Take 1 tablet (10 mg total) by mouth 2 (two) times a week. TAKE (1) TABLET TWO TIMES A WEEK. 48 tablet 3  . predniSONE (DELTASONE) 20 MG tablet Take 60 mg daily x 2 days then 40 mg daily x 2 days then 20 mg daily x 2 days 12 tablet 0   No facility-administered medications prior to visit.    Review of Systems  Constitutional: Negative for chills, fever, malaise/fatigue and weight loss.  HENT: Negative for congestion.   Respiratory: Positive for cough and sputum production. Negative for hemoptysis, shortness of breath, wheezing and stridor.   Cardiovascular: Negative for chest pain, palpitations, orthopnea, claudication, leg swelling and PND.  Gastrointestinal: Positive for heartburn. Negative for abdominal pain, constipation, diarrhea, nausea and vomiting.  Genitourinary: Negative.   Musculoskeletal: Negative.   Skin: Negative for rash.  Neurological: Negative.   Endo/Heme/Allergies: Negative.   Psychiatric/Behavioral: Negative.    Objective:   Vitals:   03/24/20 1418  BP: 124/78  Pulse: 71  Temp: (!) 97.2 F (36.2 C)  TempSrc: Temporal  SpO2: 98%  Weight: 147 lb (66.7 kg)  Height: 5' 3.5" (1.613 m)     Physical Exam Constitutional:      General: She is not in acute  distress.    Appearance: She is normal weight. She is not ill-appearing.  HENT:     Head: Normocephalic and atraumatic.  Eyes:     General: No scleral icterus.    Conjunctiva/sclera: Conjunctivae normal.     Pupils: Pupils are equal, round, and reactive to light.  Cardiovascular:     Rate and Rhythm: Normal rate and regular rhythm.     Pulses: Normal pulses.     Heart sounds: Normal heart sounds. No murmur heard.   Pulmonary:     Effort: Pulmonary effort is normal.     Breath sounds: No wheezing, rhonchi or rales.  Abdominal:     General: Bowel sounds are normal.     Palpations: Abdomen is soft.  Musculoskeletal:     Right lower leg: No edema.     Left lower leg: No edema.  Skin:     General: Skin is warm and dry.  Neurological:     General: No focal deficit present.     Mental Status: She is alert.  Psychiatric:        Mood and Affect: Mood normal.        Behavior: Behavior normal.        Thought Content: Thought content normal.        Judgment: Judgment normal.     CBC    Component Value Date/Time   WBC 8.4 02/13/2020 1800   RBC 4.35 02/13/2020 1800   HGB 12.8 02/13/2020 1800   HGB WILL FOLLOW 07/27/2017 1647   HGB 13.1 10/06/2014 1036   HCT 38.4 02/13/2020 1800   HCT WILL FOLLOW 07/27/2017 1647   HCT 38.7 10/06/2014 1036   PLT 250 02/13/2020 1800   PLT WILL FOLLOW 07/27/2017 1647   MCV 88.3 02/13/2020 1800   MCV WILL FOLLOW 07/27/2017 1647   MCV 89.5 10/06/2014 1036   MCH 29.4 02/13/2020 1800   MCHC 33.3 02/13/2020 1800   RDW 13.8 02/13/2020 1800   RDW WILL FOLLOW 07/27/2017 1647   RDW 14.1 10/06/2014 1036   LYMPHSABS 3.4 02/13/2020 1800   LYMPHSABS WILL FOLLOW 07/27/2017 1647   LYMPHSABS 1.8 10/06/2014 1036   MONOABS 0.6 02/13/2020 1800   MONOABS 0.5 10/06/2014 1036   EOSABS 0.1 02/13/2020 1800   EOSABS WILL FOLLOW 07/27/2017 1647   BASOSABS 0.1 02/13/2020 1800   BASOSABS WILL FOLLOW 07/27/2017 1647   BASOSABS 0.1 10/06/2014 1036   BMP Latest Ref Rng & Units 02/13/2020 10/01/2019 07/10/2019  Glucose 70 - 99 mg/dL 76 107(H) 87  BUN 8 - 23 mg/dL $Remove'23 15 15  'CGJzciq$ Creatinine 0.44 - 1.00 mg/dL 1.05(H) 0.87 0.91  Sodium 135 - 145 mmol/L 140 140 140  Potassium 3.5 - 5.1 mmol/L 4.1 4.5 3.4(L)  Chloride 98 - 111 mmol/L 104 106 101  CO2 22 - 32 mmol/L $RemoveB'23 29 29  'XUZRNetj$ Calcium 8.9 - 10.3 mg/dL 9.8 9.7 9.4   Chest imaging: CXR 02/13/20 The lungs are clear. Heart size is normal. No pneumothorax or pleural effusion. Aortic atherosclerosis. Surgical clips projecting over the right breast and chronic deformity of left ribs again seen.  PFT: No flowsheet data found.    Assessment & Plan:   Chronic obstructive pulmonary disease, unspecified COPD type ([place]) -  Plan: Pulmonary Function Test  Post-nasal drip  Discussion: Alexandria [name] is a 80 year old woman, former smoker with history of asthma who is self-referred to pulmonary clinic for evaluation of COPD.  She was treated for COPD exacerbation last month and has been  doing well since. She continues to have increased albuterol use on a daily basis. Her sinus congestion and post-nasal drip appear to be aggravating her ariways. She is to start flonase nasal spray daily and taker loratidine daily for allergies.   She is to continue dulera 2 puffs twice daily and as needed albuterol. We will schedule her for pulmonary function testing at her follow up visit in 3 months to better assess her underlying lung function.   Freda Jackson, MD [facility] Office: [phone]   See Amion for Pager Details    Current Outpatient Medications:  .  albuterol (VENTOLIN HFA) 108 (90 Base) MCG/ACT inhaler, INHALE 2 PUFFS INTO THE LUNGS EVERY 6 HOURS AS NEEDED FOR SHORTNESS OF BREATH AND WHEEZING., Disp: 18 g, Rfl: 3 .  ARNICA EX, Apply 1 application topically daily as needed (pain)., Disp: , Rfl:  .  b complex vitamins tablet, Take 1 tablet by mouth 2 (two) times daily. PLUS FOLIC ACID PLUS VIT. C, Disp: , Rfl:  .  Biotin 5000 MCG CAPS, Take 5,000 mcg by mouth daily. , Disp: , Rfl:  .  Capsaicin 0.025 % PADS, Apply 1 application topically daily as needed (pain)., Disp: , Rfl:  .  Cholecalciferol (VITAMIN D-3) 5000 UNITS TABS, Take 5,000 Units by mouth daily. , Disp: , Rfl:  .  clopidogrel (PLAVIX) 75 MG tablet, TAKE 1 TABLET BY MOUTH DAILY., Disp: 90 tablet, Rfl: 3 .  CO-ENZYME Q10-VITAMIN E PO, Take 1 tablet by mouth daily., Disp: , Rfl:  .  Diclofenac Sodium (PENNSAID) 2 % SOLN, Apply 1 application topically 3 (three) times daily., Disp: 112 g, Rfl: 0 .  diphenhydrAMINE (BENADRYL) 25 MG tablet, Take 25 mg by mouth every 6 (six) hours as needed. Patient taking 1/2 (12.5 mg) as needed for  allergies and itching, Disp: , Rfl:  .  DULERA 200-5 MCG/ACT AERO, USE 2 PUFFS TWICE DAILY., Disp: 13 g, Rfl: 5 .  escitalopram (LEXAPRO) 10 MG tablet, TAKE 1 TABLET ONCE DAILY., Disp: 90 tablet, Rfl: 0 .  fluticasone (FLONASE) 50 MCG/ACT nasal spray, Place 1 spray into both nostrils daily., Disp: 16 g, Rfl: 2 .  hydrochlorothiazide (MICROZIDE) 12.5 MG capsule, Take 1 capsule (12.5 mg total) by mouth 2 (two) times daily., Disp: 180 capsule, Rfl: 3 .  loratadine (CLARITIN) 10 MG tablet, Take 10 mg by mouth daily as needed for allergies., Disp: , Rfl:  .  Misc Natural Products (OSTEO BI-FLEX ADV TRIPLE ST PO), Take 1 tablet by mouth daily., Disp: , Rfl:  .  ondansetron (ZOFRAN ODT) 4 MG disintegrating tablet, Take 1 tablet (4 mg total) by mouth every 8 (eight) hours as needed for nausea or vomiting., Disp: 20 tablet, Rfl: 1 .  pantoprazole (PROTONIX) 40 MG tablet, Take 1 tablet (40 mg total) by mouth daily., Disp: 90 tablet, Rfl: 3 .  Polyethyl Glycol-Propyl Glycol (SYSTANE OP), Place 1 drop into both eyes daily as needed. Dry eyes, Disp: , Rfl:  .  potassium chloride SA (KLOR-CON) 20 MEQ tablet, Take 3 tablets (60 mEq total) by mouth daily., Disp: 270 tablet, Rfl: 3 .  Probiotic Product (ALIGN PO), Take 1 tablet by mouth daily., Disp: , Rfl:  .  Selenium 200 MCG CAPS, Take 200 mcg by mouth 2 (two) times daily. , Disp: , Rfl:  .  traMADol (ULTRAM) 50 MG tablet, Take 1 tablet (50 mg total) by mouth every 6 (six) hours as needed. (Patient taking differently: Take 50 mg  by mouth every 6 (six) hours as needed for severe pain.), Disp: 30 tablet, Rfl:  .  Trolamine Salicylate (ASPERCREME EX), Apply 1 application topically daily as needed (pain)., Disp: , Rfl:  .  amLODipine (NORVASC) 10 MG tablet, Take 1 tablet (10 mg total) by mouth daily., Disp: 90 tablet, Rfl: 3 .  simvastatin (ZOCOR) 10 MG tablet, Take 1 tablet (10 mg total) by mouth 2 (two) times a week. TAKE (1) TABLET TWO TIMES A WEEK., Disp: 48  tablet, Rfl: 3

## 2020-04-02 NOTE — Telephone Encounter (Signed)
Error message

## 2020-05-05 ENCOUNTER — Ambulatory Visit (INDEPENDENT_AMBULATORY_CARE_PROVIDER_SITE_OTHER): Payer: Medicare Other | Admitting: Adult Health

## 2020-05-05 ENCOUNTER — Encounter: Payer: Self-pay | Admitting: Adult Health

## 2020-05-05 ENCOUNTER — Other Ambulatory Visit: Payer: Self-pay

## 2020-05-05 VITALS — BP 130/70 | Temp 98.1°F | Wt 147.0 lb

## 2020-05-05 DIAGNOSIS — Z01818 Encounter for other preprocedural examination: Secondary | ICD-10-CM | POA: Diagnosis not present

## 2020-05-05 NOTE — Patient Instructions (Addendum)
We will send all of your info to Dr. Reva Bores once we get the  labs back   Please stop plavix 5 days before surgery.

## 2020-05-05 NOTE — Progress Notes (Signed)
Subjective:    Patient ID: Alexandria [name], female    DOB: Feb 05, 1941, 80 y.o.   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  She is being evaluated today for surgical clearance for Posterior Vaginal Wall and Perineal body repair done by Damian Leavell, MD at [facility].   The date of surgery is: TBD   Review of Systems See HPI   Past Medical History:  Diagnosis Date  . Allergy   . Anemia    hx of   . Anxiety   . Arthritis    left knee, hands, back  . Asthma    daily and prn inhalers  . Brainstem infarct, acute ([place]) 03/2011   slight expressive aphasia, occ. problems with balance  . Breast cancer ([place]) 12/2011   right, ER+, PR -, Her 2 -  . COPD (chronic obstructive pulmonary disease) ([place])   . Degenerative joint disease of low back   . Depression   . Dysrhythmia    atrial fib   . GERD (gastroesophageal reflux disease)   . Hearing loss    bilateral hearing aids  . 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 History of kidney stones   . Hx of radiation therapy 03/12/12 -04/13/12   right breast  . Hyperlipidemia   . Hypertension    under control, has been on med. since age 89  . IBS (irritable  bowel syndrome)   . Long term current use of aromatase inhibitor 05/08/2012  . Overactive bladder   . Palpitations   . Pneumonia    hx of x 2   . PONV (postoperative nausea and vomiting)   . PVD (peripheral vascular disease) ([facility])    varicose veins - left worse than right  . Spinal stenosis   . Stress incontinence   . Stroke [facility])    problem with expressive communicaiton  . Traumatic hemopneumothorax 1982   left    Social History   Socioeconomic History  . Marital status: Widowed    Spouse name: Not on file  . Number of children: Not on file  . Years of education: Not on file  . Highest education level: Not on file  Occupational History  . Not on file  Tobacco Use  . Smoking status: Former Smoker    Packs/day: 0.50    Years: 15.00    Pack years: 7.50    Types: Cigarettes    Quit date: 08/28/1993    Years since quitting: 26.7  . Smokeless tobacco: Never Used  Vaping Use  . Vaping Use: Never used  Substance and Sexual Activity  . Alcohol use: No    Alcohol/week: 0.0 standard drinks  . Drug use: No  . Sexual activity: Not Currently  Other Topics Concern  . Not on file  Social History Narrative   Widowed since 2014   Retired Scientist, product/process development    Son and daughter  Social Determinants of Health   Financial Resource Strain: Not on file  Food Insecurity: Not on file  Transportation Needs: Not on file  Physical Activity: Not on file  Stress: Not on file  Social Connections: Not on file  Intimate Partner Violence: Not on file    Past Surgical History:  Procedure Laterality Date  . ANKLE HARDWARE REMOVAL     right  . BREAST BIOPSY  1976   right  . BREAST BIOPSY Left 2019   benign lymph node  . BREAST LUMPECTOMY  2013   right with snbx  . CHOLECYSTECTOMY  1990s  . COLONOSCOPY    . CYSTOSCOPY  07/05/2005  . KNEE SURGERY  1980s   left  . KYPHOPLASTY N/A 06/15/2017   Procedure: KYPHOPLASTY T11;  Surgeon: Melina Schools, MD;  Location: [facility];  Service: Orthopedics;   Laterality: N/A;  90 mins  . ORIF ANKLE FRACTURE  1980s   right   . TOTAL KNEE ARTHROPLASTY Left 12/08/2015   Procedure: LEFT TOTAL KNEE ARTHROPLASTY;  Surgeon: Paralee Cancel, MD;  Location: [facility];  Service: Orthopedics;  Laterality: Left;  . TRANSOBTURATOR SLING  07/05/2005  . TUBAL LIGATION  1976  . VEIN SURGERY     vein ablation left leg    Family History  Problem Relation Age of Onset  . Cancer Maternal Aunt        breast  . Cancer Paternal Aunt        Multiple Myeloma  . Stomach cancer Paternal Uncle   . Breast cancer Paternal Uncle   . Breast cancer Cousin        early 32s  . Breast cancer Paternal Aunt        late 38s-60s  . Stroke Mother   . Heart attack Father        Died age 17  . Heart attack Paternal Grandfather   . Breast cancer Paternal Aunt        bilateral breast cancer  . Dementia Paternal Aunt   . Congestive Heart Failure Paternal Uncle        Ischemic  . Congestive Heart Failure Paternal Uncle        Ischemic    Allergies  Allergen Reactions  . Augmentin [Amoxicillin-Pot Clavulanate] Itching, Rash and Other (See Comments)    PT DEVELOPED SEVERE RASH INVOLVING MUCUS MEMBRANES or SKIN NECROSIS WITH PENICILLINS: #  #  #  YES  #  #  #    . Penicillins Hives and Other (See Comments)    Has patient had a PCN reaction causing immediate rash, facial/tongue/throat swelling, SOB or lightheadedness with hypotension: No HAS PT DEVELOPED SEVERE RASH INVOLVING MUCUS MEMBRANES or SKIN NECROSIS: #  #  #  YES  #  #  #   Has patient had a PCN reaction that required hospitalization: No Has patient had a PCN reaction occurring within the last 10 years: No   . Aspirin Hives  . Chlorhexidine Gluconate Rash  . Chocolate Hives and Other (See Comments)    RUNNY NOSE   . Coffee Bean Extract Hives and Other (See Comments)    RUNNY NOSE   . Ibuprofen Hives and Other (See Comments)    RUNNY NOSE  . Oxycodone Hcl Hives and Nausea And Vomiting  . Shrimp [Shellfish Allergy]  Hives and Other (See Comments)    RUNNY NOSE   . Sulfonamide Derivatives Hives  . Lipitor [Atorvastatin Calcium]     Leg Pain/Joint Pain/Back Pain  Current Outpatient Medications on File Prior to Visit  Medication Sig Dispense Refill  . albuterol (VENTOLIN HFA) 108 (90 Base) MCG/ACT inhaler INHALE 2 PUFFS INTO THE LUNGS EVERY 6 HOURS AS NEEDED FOR SHORTNESS OF BREATH AND WHEEZING. 18 g 3  . ARNICA EX Apply 1 application topically daily as needed (pain).    Marland Kitchen b complex vitamins tablet Take 1 tablet by mouth 2 (two) times daily. PLUS FOLIC ACID PLUS VIT. C    . Biotin 5000 MCG CAPS Take 5,000 mcg by mouth daily.     . Capsaicin 0.025 % PADS Apply 1 application topically daily as needed (pain).    . Cholecalciferol (VITAMIN D-3) 5000 UNITS TABS Take 5,000 Units by mouth daily.     . clopidogrel (PLAVIX) 75 MG tablet TAKE 1 TABLET BY MOUTH DAILY. 90 tablet 3  . CO-ENZYME Q10-VITAMIN E PO Take 1 tablet by mouth daily.    . Diclofenac Sodium (PENNSAID) 2 % SOLN Apply 1 application topically 3 (three) times daily. 112 g 0  . diphenhydrAMINE (BENADRYL) 25 MG tablet Take 25 mg by mouth every 6 (six) hours as needed. Patient taking 1/2 (12.5 mg) as needed for allergies and itching    . DULERA 200-5 MCG/ACT AERO USE 2 PUFFS TWICE DAILY. 13 g 5  . escitalopram (LEXAPRO) 10 MG tablet TAKE 1 TABLET ONCE DAILY. 90 tablet 0  . fluticasone (FLONASE) 50 MCG/ACT nasal spray Place 1 spray into both nostrils daily. 16 g 2  . hydrochlorothiazide (MICROZIDE) 12.5 MG capsule Take 1 capsule (12.5 mg total) by mouth 2 (two) times daily. 180 capsule 3  . loratadine (CLARITIN) 10 MG tablet Take 10 mg by mouth daily as needed for allergies.    . Misc Natural Products (OSTEO BI-FLEX ADV TRIPLE ST PO) Take 1 tablet by mouth daily.    . ondansetron (ZOFRAN ODT) 4 MG disintegrating tablet Take 1 tablet (4 mg total) by mouth every 8 (eight) hours as needed for nausea or vomiting. 20 tablet 1  . pantoprazole (PROTONIX) 40  MG tablet Take 1 tablet (40 mg total) by mouth daily. 90 tablet 3  . Polyethyl Glycol-Propyl Glycol (SYSTANE OP) Place 1 drop into both eyes daily as needed. Dry eyes    . potassium chloride SA (KLOR-CON) 20 MEQ tablet Take 3 tablets (60 mEq total) by mouth daily. 270 tablet 3  . Probiotic Product (ALIGN PO) Take 1 tablet by mouth daily.    . Selenium 200 MCG CAPS Take 200 mcg by mouth 2 (two) times daily.     . traMADol (ULTRAM) 50 MG tablet Take 1 tablet (50 mg total) by mouth every 6 (six) hours as needed. (Patient taking differently: Take 50 mg by mouth every 6 (six) hours as needed for severe pain.) 30 tablet   . Trolamine Salicylate (ASPERCREME EX) Apply 1 application topically daily as needed (pain).    Marland Kitchen amLODipine (NORVASC) 10 MG tablet Take 1 tablet (10 mg total) by mouth daily. 90 tablet 3  . simvastatin (ZOCOR) 10 MG tablet Take 1 tablet (10 mg total) by mouth 2 (two) times a week. TAKE (1) TABLET TWO TIMES A WEEK. 48 tablet 3   No current facility-administered medications on file prior to visit.    BP 130/70   Temp 98.1 F (36.7 C)   Wt 147 lb (66.7 kg)   BMI 25.63 kg/m       Objective:   Physical Exam Vitals and nursing note reviewed.  Constitutional:  Appearance: Normal appearance.  Cardiovascular:     Rate and Rhythm: Normal rate and regular rhythm.     Pulses: Normal pulses.     Heart sounds: Normal heart sounds.  Pulmonary:     Effort: Pulmonary effort is normal.     Breath sounds: Normal breath sounds.  Abdominal:     General: Abdomen is flat. Bowel sounds are normal.  Musculoskeletal:        General: Normal range of motion.  Skin:    General: Skin is warm and dry.     Capillary Refill: Capillary refill takes less than 2 seconds.  Neurological:     General: No focal deficit present.     Mental Status: She is alert and oriented to person, place, and time.  Psychiatric:        Mood and Affect: Mood normal.        Behavior: Behavior normal.         Thought Content: Thought content normal.        Judgment: Judgment normal.       Assessment & Plan:  1. Pre-operative clearance  - EKG 12-Lead- NSR, Rate 68  - Had xray done in two months ago which showed no acute cardiopulmonary disease - CBC with Differential/Platelet; Future - Comprehensive metabolic panel; Future - Stop Plavix 5 days prior to surgery   [name], NP

## 2020-05-05 NOTE — Addendum Note (Signed)
Addended by: Tessie Fass D on: 05/05/2020 03:41 PM   Modules accepted: Orders

## 2020-05-06 LAB — COMPREHENSIVE METABOLIC PANEL
ALT: 12 U/L (ref 0–35)
AST: 19 U/L (ref 0–37)
Albumin: 4.4 g/dL (ref 3.5–5.2)
Alkaline Phosphatase: 52 U/L (ref 39–117)
BUN: 18 mg/dL (ref 6–23)
CO2: 30 mEq/L (ref 19–32)
Calcium: 9.7 mg/dL (ref 8.4–10.5)
Chloride: 100 mEq/L (ref 96–112)
Creatinine, Ser: 0.98 mg/dL (ref 0.40–1.20)
GFR: 55.7 mL/min — ABNORMAL LOW (ref >60.00)
Glucose, Bld: 77 mg/dL (ref 70–99)
Potassium: 4.2 mEq/L (ref 3.5–5.1)
Sodium: 139 mEq/L (ref 135–145)
Total Bilirubin: 1.3 mg/dL — ABNORMAL HIGH (ref 0.2–1.2)
Total Protein: 7 g/dL (ref 6.0–8.3)

## 2020-05-06 LAB — CBC WITH DIFFERENTIAL/PLATELET
Basophils Absolute: 0.1 10*3/uL (ref 0.0–0.1)
Basophils Relative: 1.1 % (ref 0.0–3.0)
Eosinophils Absolute: 0.1 10*3/uL (ref 0.0–0.7)
Eosinophils Relative: 0.9 % (ref 0.0–5.0)
HCT: 38.6 % (ref 36.0–46.0)
Hemoglobin: 13 g/dL (ref 12.0–15.0)
Lymphocytes Relative: 32.3 % (ref 12.0–46.0)
Lymphs Abs: 2.5 10*3/uL (ref 0.7–4.0)
MCHC: 33.7 g/dL (ref 30.0–36.0)
MCV: 87.6 fl (ref 78.0–100.0)
Monocytes Absolute: 0.5 10*3/uL (ref 0.1–1.0)
Monocytes Relative: 6.9 % (ref 3.0–12.0)
Neutro Abs: 4.5 10*3/uL (ref 1.4–7.7)
Neutrophils Relative %: 58.8 % (ref 43.0–77.0)
Platelets: 242 10*3/uL (ref 150.0–400.0)
RBC: 4.41 Mil/uL (ref 3.87–5.11)
RDW: 13.9 % (ref 11.5–15.5)
WBC: 7.6 10*3/uL (ref 4.0–10.5)

## 2020-05-10 IMAGING — US US BREAST*R* LIMITED INC AXILLA
1 series · 2 of 2 positions shown · non-contrast
Comparison: Previous exam(s).

CLINICAL DATA: History of right breast cancer status post
lumpectomy in 8637. Patient complains of a palpable abnormality and
pain inferior to the lumpectomy site.Patient also complains of
diffuse pain in the lateral aspect of the right breast.

EXAM:
DIGITAL DIAGNOSTIC BILATERAL MAMMOGRAM WITH CAD AND TOMO
ULTRASOUND RIGHT BREAST

[Series 1: us breast*right* limited inc axilla · 0.06mm/px · 2 of 2 slices shown]
[im 1/2]
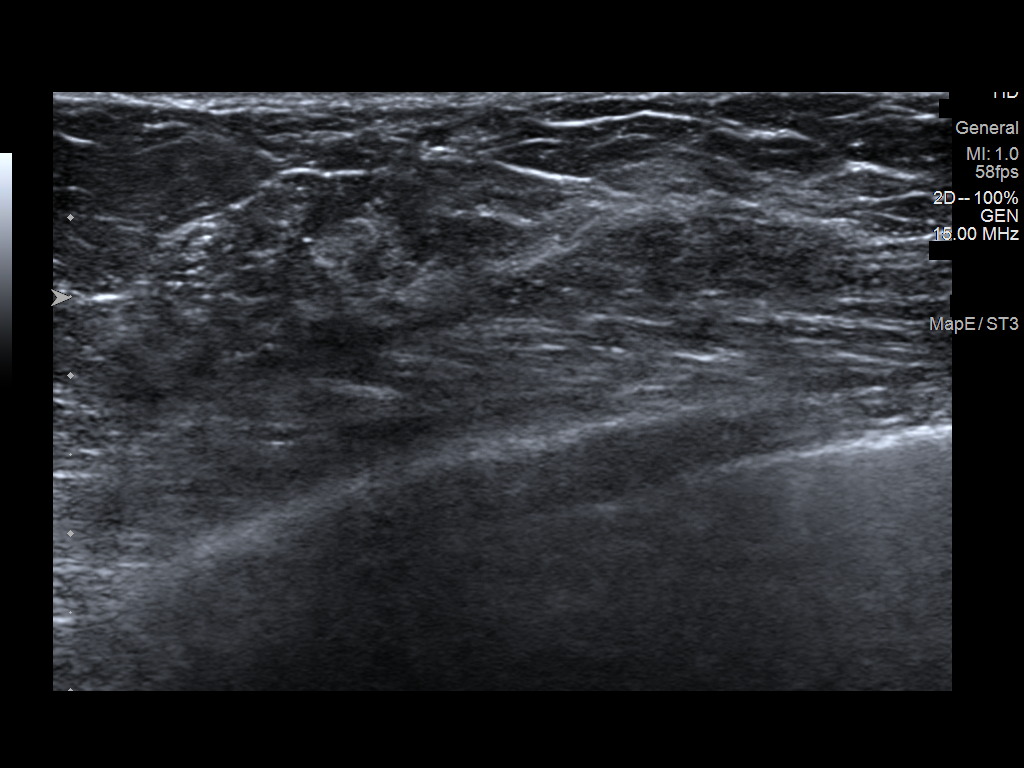
[im 2/2]
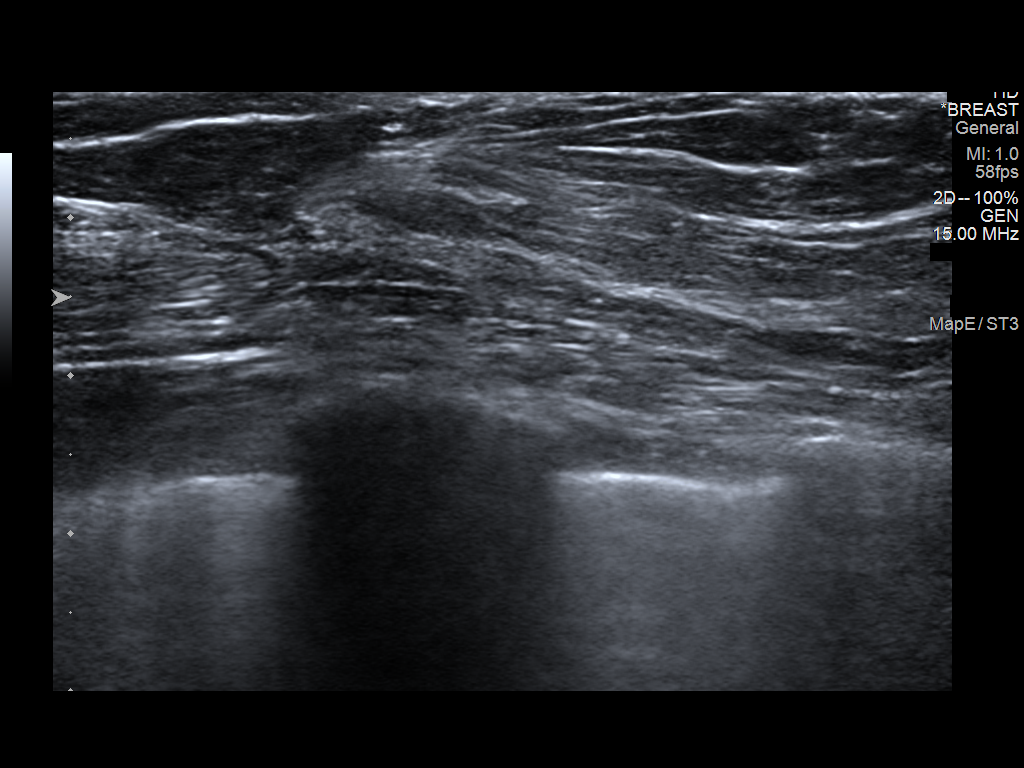

[2 of 2 positions shown; findings below may reference images not displayed]

ACR Breast Density Category b: There are scattered areas of
fibroglandular density.
FINDINGS: Stable lumpectomy changes are seen in the right breast. No
suspicious mass or malignant type microcalcifications identified in
either breast.

Mammographic images were processed with CAD.

On physical exam, I do not palpate a discrete mass in the area of
clinical concern in the right breast at [DATE] 8 cm from the nipple.

Targeted ultrasound is performed, showing normal tissue in the area
of clinical concern in the right breast at [DATE] 8 cm from the
nipple inferior to the lumpectomy scar.
IMPRESSION: No evidence of malignancy in either breast.

RECOMMENDATION:
Bilateral screening mammogram in 1 year is recommended.

I have discussed the findings and recommendations with the patient.
If applicable, a reminder letter will be sent to the patient
regarding the next appointment.

BI-RADS CATEGORY  2: Benign.

## 2020-05-10 IMAGING — MG DIGITAL DIAGNOSTIC BILAT W/ TOMO W/ CAD
6 of 10 series · 6 of 30 positions shown · non-contrast
Comparison: Previous exam(s).

CLINICAL DATA: History of right breast cancer status post
lumpectomy in 8637. Patient complains of a palpable abnormality and
pain inferior to the lumpectomy site.Patient also complains of
diffuse pain in the lateral aspect of the right breast.

EXAM:
DIGITAL DIAGNOSTIC BILATERAL MAMMOGRAM WITH CAD AND TOMO
ULTRASOUND RIGHT BREAST

[L CC synth-2D]
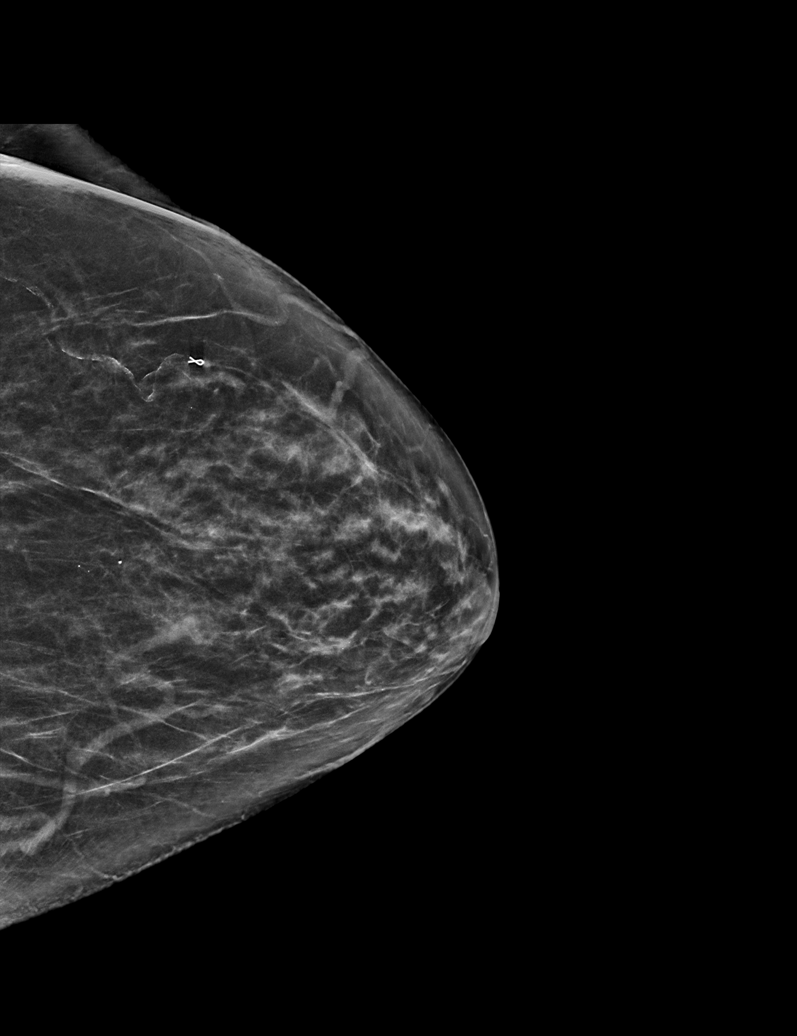

[L MLO synth-2D]
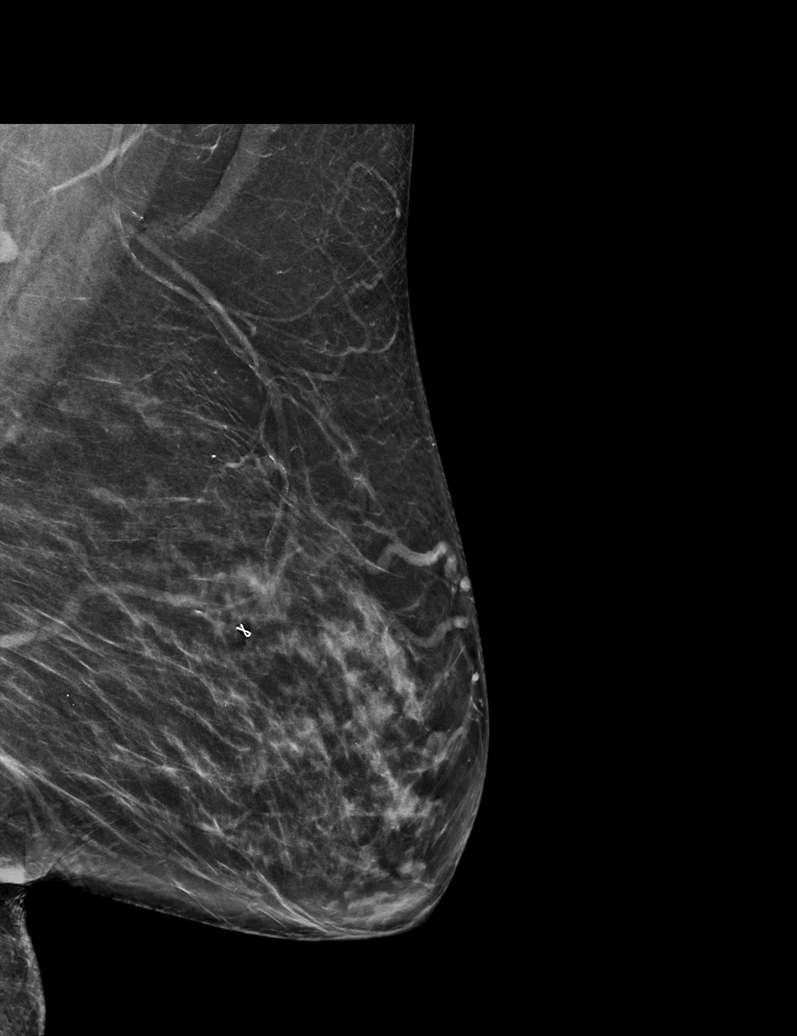

[R CC synth-2D]
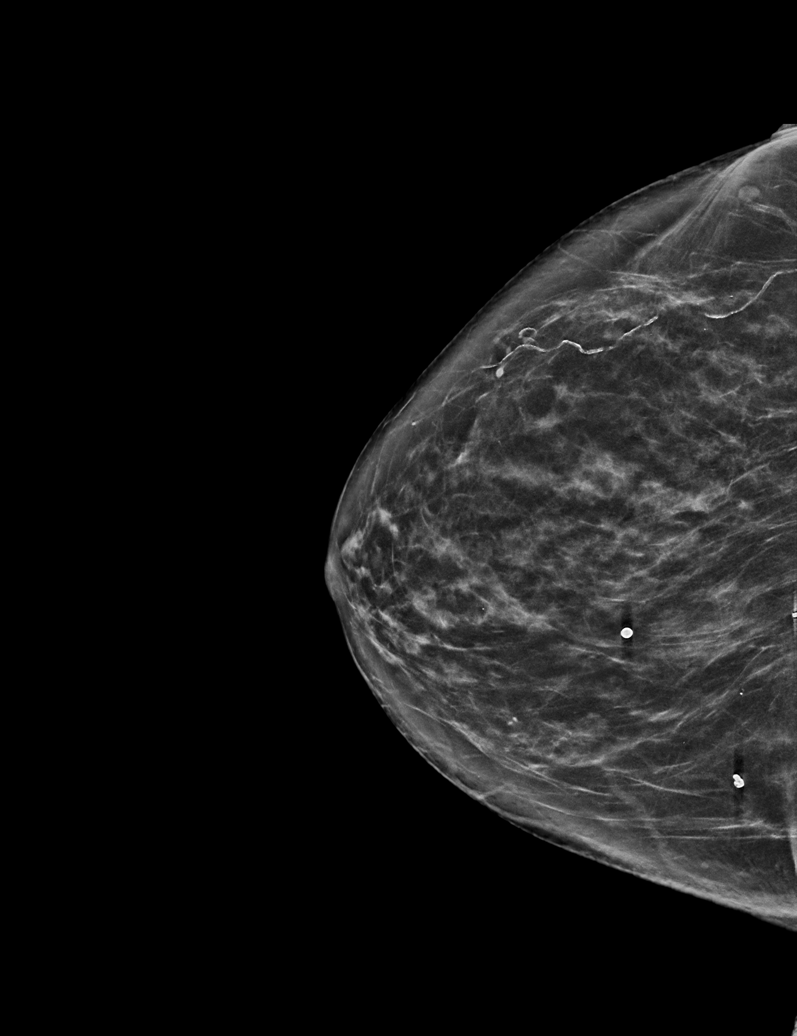

[R MLO synth-2D]
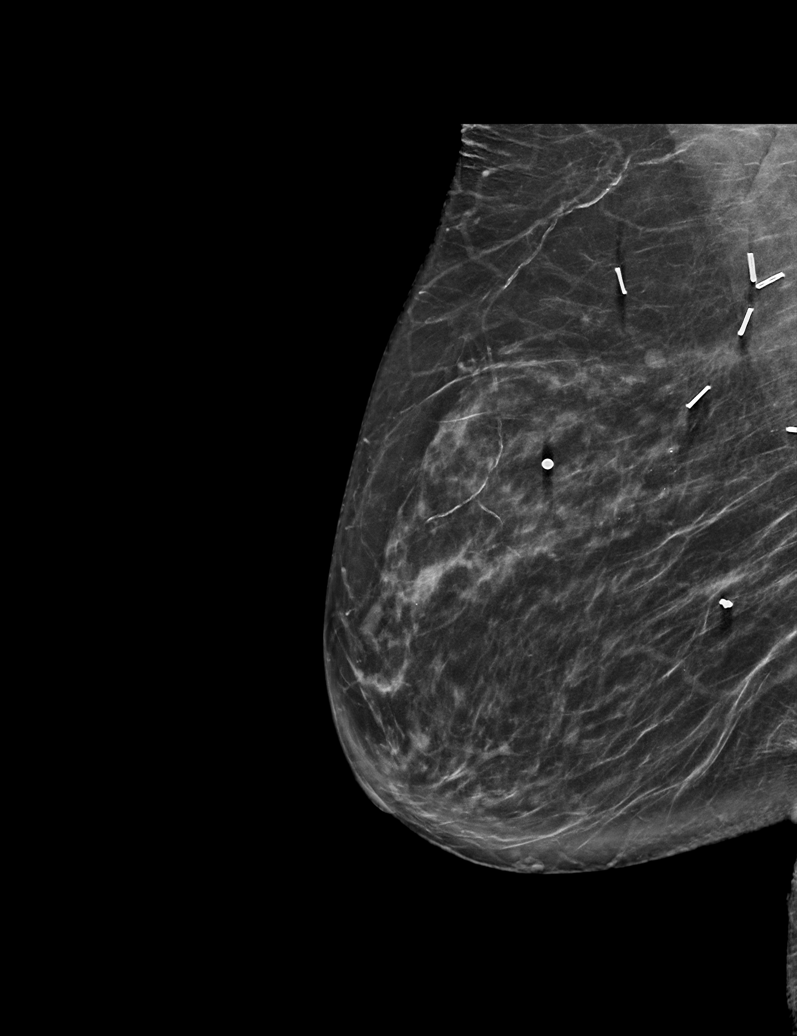

[R TAN synth-2D]
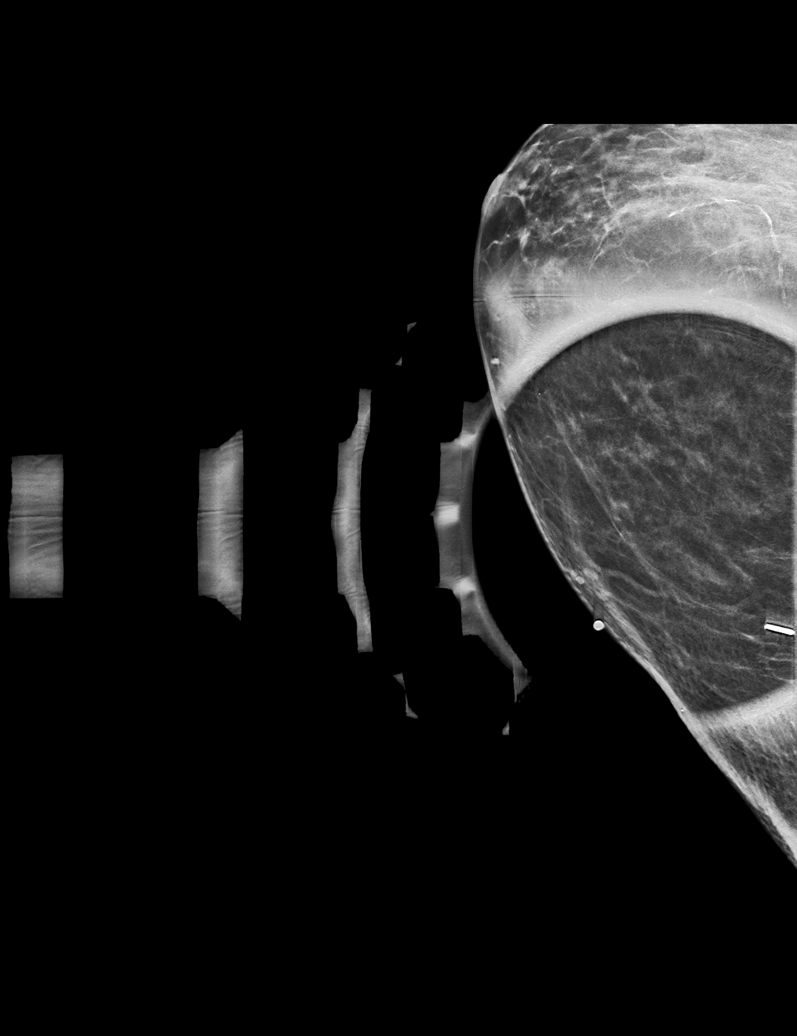

[R CC tomo · tomo slice 32/63.0]
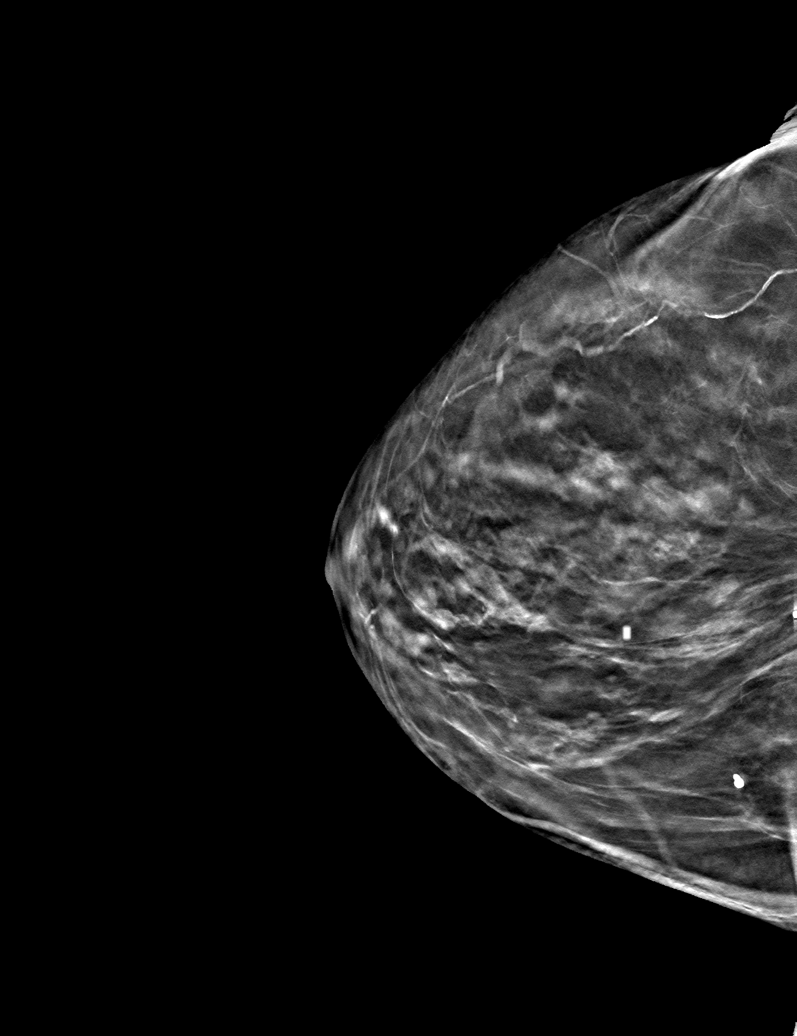

[6 of 30 positions shown; findings below may reference images not displayed]

ACR Breast Density Category b: There are scattered areas of
fibroglandular density.
FINDINGS: Stable lumpectomy changes are seen in the right breast. No
suspicious mass or malignant type microcalcifications identified in
either breast.

Mammographic images were processed with CAD.

On physical exam, I do not palpate a discrete mass in the area of
clinical concern in the right breast at [DATE] 8 cm from the nipple.

Targeted ultrasound is performed, showing normal tissue in the area
of clinical concern in the right breast at [DATE] 8 cm from the
nipple inferior to the lumpectomy scar.
IMPRESSION: No evidence of malignancy in either breast.

RECOMMENDATION:
Bilateral screening mammogram in 1 year is recommended.

I have discussed the findings and recommendations with the patient.
If applicable, a reminder letter will be sent to the patient
regarding the next appointment.

BI-RADS CATEGORY  2: Benign.

## 2020-05-18 ENCOUNTER — Other Ambulatory Visit: Payer: Self-pay | Admitting: Adult Health

## 2020-05-18 DIAGNOSIS — F32A Depression, unspecified: Secondary | ICD-10-CM

## 2020-05-18 DIAGNOSIS — F419 Anxiety disorder, unspecified: Secondary | ICD-10-CM

## 2020-05-19 ENCOUNTER — Telehealth: Payer: Self-pay | Admitting: Pulmonary Disease

## 2020-05-19 ENCOUNTER — Telehealth: Payer: Self-pay | Admitting: Adult Health

## 2020-05-19 MED ORDER — ADVAIR HFA 230-21 MCG/ACT IN AERO: 2.0000 | INHALATION_SPRAY | Freq: Two times a day (BID) | RESPIRATORY_TRACT | 6 refills | Status: DC

## 2020-05-19 NOTE — Telephone Encounter (Signed)
adviar hfa 261mcg will work, 2 puffs twice daily  Thanks, jon

## 2020-05-19 NOTE — Telephone Encounter (Signed)
Called and spoke with Abigail Butts at [facility]. She confirmed that Alexandria [name] is on backorder. Per Abigail Butts, patient has not been using the [facility] since last year but wanted to re-start it. She confirmed that the patient still has [facility] Medicare.   I looked at patient's formulary. The following medications are covered and do not need a PA: Advair Diskus and HFA, Arnuity, Breo, Flovent HFA and Diskus, Spiriva respimat, Symbicort, Trelegy and Wixela.   Dr. Erin Fulling, can you please advise? Thanks!

## 2020-05-19 NOTE — Telephone Encounter (Signed)
Pharmacy call and stated the [facility] inhaler they can't get it and want to know can you call something else in for her.

## 2020-05-19 NOTE — Telephone Encounter (Signed)
Called and spoke with patient. She had several questions about her appts. She needed to be scheduled for a PFT. She will be having surgery at the end of this month and wanted to have the PFT done before then. She has been scheduled for a PFT on 3/15. Covid test on 3/12. She is aware of the location. She has been scheduled for an OV with JD on 4/26 at 130pm.   I went over the instructions for Advair, she verbalized understanding. This has been sent in for her. Nothing further needed at time of call.

## 2020-05-21 ENCOUNTER — Other Ambulatory Visit: Payer: Self-pay | Admitting: *Deleted

## 2020-05-21 DIAGNOSIS — J449 Chronic obstructive pulmonary disease, unspecified: Secondary | ICD-10-CM

## 2020-05-21 NOTE — Progress Notes (Signed)
pft  

## 2020-05-23 ENCOUNTER — Other Ambulatory Visit (HOSPITAL_COMMUNITY)
Admission: RE | Admit: 2020-05-23 | Discharge: 2020-05-23 | Disposition: A | Discharge status: Home / Self Care | Payer: Medicare Other | Source: Ambulatory Visit | Attending: Internal Medicine | Admitting: Internal Medicine

## 2020-05-23 DIAGNOSIS — Z01812 Encounter for preprocedural laboratory examination: Secondary | ICD-10-CM | POA: Diagnosis present

## 2020-05-23 DIAGNOSIS — Z20822 Contact with and (suspected) exposure to covid-19: Secondary | ICD-10-CM | POA: Diagnosis not present

## 2020-05-23 LAB — SARS CORONAVIRUS 2 (TAT 6-24 HRS): SARS Coronavirus 2: NEGATIVE

## 2020-05-26 ENCOUNTER — Other Ambulatory Visit: Payer: Self-pay

## 2020-05-26 ENCOUNTER — Ambulatory Visit (INDEPENDENT_AMBULATORY_CARE_PROVIDER_SITE_OTHER): Payer: Medicare Other | Admitting: Pulmonary Disease

## 2020-05-26 DIAGNOSIS — J449 Chronic obstructive pulmonary disease, unspecified: Secondary | ICD-10-CM

## 2020-05-26 LAB — PULMONARY FUNCTION TEST
DL/VA % pred: 104 %
DL/VA: 4.31 ml/min/mmHg/L
DLCO cor % pred: 90 %
DLCO cor: 16.91 ml/min/mmHg
DLCO unc % pred: 90 %
DLCO unc: 16.91 ml/min/mmHg
FEF 25-75 Post: 2.2 L/sec
FEF 25-75 Pre: 1.8 L/sec
FEF2575-%Change-Post: 22 %
FEF2575-%Pred-Post: 143 %
FEF2575-%Pred-Pre: 117 %
FEV1-%Change-Post: 2 %
FEV1-%Pred-Post: 99 %
FEV1-%Pred-Pre: 97 %
FEV1-Post: 1.99 L
FEV1-Pre: 1.95 L
FEV1FVC-%Change-Post: -1 %
FEV1FVC-%Pred-Pre: 108 %
FEV6-%Change-Post: 3 %
FEV6-%Pred-Post: 98 %
FEV6-%Pred-Pre: 94 %
FEV6-Post: 2.49 L
FEV6-Pre: 2.4 L
FEV6FVC-%Pred-Post: 105 %
FEV6FVC-%Pred-Pre: 105 %
FVC-%Change-Post: 3 %
FVC-%Pred-Post: 93 %
FVC-%Pred-Pre: 90 %
FVC-Post: 2.49 L
FVC-Pre: 2.41 L
Post FEV1/FVC ratio: 80 %
Post FEV6/FVC ratio: 100 %
Pre FEV1/FVC ratio: 81 %
Pre FEV6/FVC Ratio: 100 %
RV % pred: 95 %
RV: 2.2 L
TLC % pred: 96 %
TLC: 4.79 L

## 2020-05-26 NOTE — Progress Notes (Signed)
Full PFT performed today. °

## 2020-05-26 NOTE — Patient Instructions (Signed)
Full  pulmonary function test performed today. 

## 2020-06-10 ENCOUNTER — Telehealth: Payer: Self-pay | Admitting: Pulmonary Disease

## 2020-06-10 NOTE — Telephone Encounter (Signed)
Pt's PFT results have been faxed to provided fax number by pt. Called and spoke with pt letting her know this had been done and she verbalized understanding. Nothing further needed.

## 2020-07-06 ENCOUNTER — Telehealth: Payer: Self-pay

## 2020-07-06 NOTE — Telephone Encounter (Signed)
Received a VM from patient's son that she had tried to contact us on Friday about the vein in her leg. History is a little confusing. She mentioned sitting in the ER for 9 hours but I think that may have been last year. She is very concerned about 2 - 4 spots of phlebitis and would like to be seen soon. I advised son that warm compresses, ibuprofen, compression and elevation were likely solutions - patient would like to be seen. Placed on [facility] vein day.

## 2020-07-07 ENCOUNTER — Other Ambulatory Visit: Payer: Self-pay

## 2020-07-07 ENCOUNTER — Ambulatory Visit (INDEPENDENT_AMBULATORY_CARE_PROVIDER_SITE_OTHER): Payer: Medicare Other | Admitting: Pulmonary Disease

## 2020-07-07 ENCOUNTER — Encounter: Payer: Self-pay | Admitting: Pulmonary Disease

## 2020-07-07 VITALS — BP 130/80 | HR 89 | Temp 98.4°F | Ht 63.5 in | Wt 146.8 lb

## 2020-07-07 DIAGNOSIS — R0982 Postnasal drip: Secondary | ICD-10-CM

## 2020-07-07 DIAGNOSIS — J4530 Mild persistent asthma, uncomplicated: Secondary | ICD-10-CM | POA: Diagnosis not present

## 2020-07-07 MED ORDER — IPRATROPIUM BROMIDE 0.03 % NA SOLN: 2.0000 | Freq: Two times a day (BID) | NASAL | 12 refills | Status: DC

## 2020-07-07 MED ORDER — MONTELUKAST SODIUM 10 MG PO TABS: 10.0000 mg | ORAL_TABLET | Freq: Every day | ORAL | 11 refills | Status: DC

## 2020-07-07 NOTE — Patient Instructions (Addendum)
Continue advair inhaler 2 puffs twice daily - rinse mouth out after each use  Continue albuterol as needed 1-2 puffs every 4-6 hours  Start montelukast 10mg  daily  Use flonase nasal spray 2 sprays per nostril daily  Start ipratropium nasal spray 2 sprays per nostril twice daily as needed for runny nose and post-nasal drainage  You do not have COPD. You likely have reactive airways disease or asthma that is aggravated by allergies, sinus drainage and GERD.

## 2020-07-07 NOTE — Progress Notes (Signed)
Synopsis: Self referred in 03/2020 for COPD  Subjective:   PATIENT ID: Alexandria [name] GENDER: female DOB: 1941-03-12, MRN: [id number]  Scheduled for posterior vaginal surgery, rectocele.  Then hurt herself after a fall in her drive. Seeing orthopedics for her left shoulder. Hurt the left side of her face.    [place] last week again.  Having issues with phlebitis in her legs and arms.   HPI  Chief Complaint  Patient presents with  . Follow-up    F/U after PFT. Patient states she was not able to get surgery due to a recent fall. States she has been more SOB over the last few weeks. Possible sinus infection.    Alexandria [name] is a 80 year old woman, former smoker with history of asthma who returns to pulmonary clinic for follow up after pulmonary function testing.  She had pulmonary function tests on 05/26/20 which were normal. 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   She also complains of sinus congestion and pressure. She is using advair 230-41mg 2 puffs twice daily and as needed albuterol.   She is planning to have surgery for a rectocele but this has been delayed due to her recent fall.  Past Medical History:  Diagnosis Date  . Allergy   . Anemia    hx of   . Anxiety   . Arthritis    left knee, hands, back  . Asthma    daily and prn inhalers  . Brain[place], [place] 03[place]   slight expressive aphasia, occ. problems with balance  . Breast cancer ([facility] 12/2011   right, ER+, PR -, Her 2 -  . COPD [place] obstructive pulmonary disease) [place]   . Degenerative joint disease of low back   . Depression   . Dysrhythmia    atrial fib   . GERD (gastroesophageal reflux disease)   . Hearing loss    bilateral hearing aids  . 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 History of  kidney stones   . Hx of radiation therapy 03/12/12 -04/13/12   right breast  . Hyperlipidemia   . Hypertension    under control, has been on med. since age 80 . IBS (irritable bowel syndrome)   . Long term current use of aromatase inhibitor 05/08/2012  . Overactive bladder   . Palpitations   . Pneumonia    hx of x 2   . PONV (postoperative nausea and vomiting)   . PVD (peripheral vascular disease) (HCC)    varicose veins - left worse than right  . Spinal stenosis   . Stress incontinence   . Stroke ([facility]    problem with expressive communicaiton  . Traumatic hemopneumothorax 1982   left     Family History  Problem Relation Age of Onset  . Cancer Maternal Aunt        breast  . Cancer Paternal Aunt        Multiple Myeloma  . Stomach cancer Paternal Uncle   . Breast cancer Paternal Uncle   . Breast cancer Cousin        early 442s . Breast cancer Paternal Aunt        late 554s-60s . Stroke Mother   . Heart attack Father  Died age 75  . Heart attack Paternal Grandfather   . Breast cancer Paternal Aunt        bilateral breast cancer  . Dementia Paternal Aunt   . Congestive Heart Failure Paternal Uncle        Ischemic  . Congestive Heart Failure Paternal Uncle        Ischemic     Social History   Socioeconomic History  . Marital status: Widowed    Spouse name: Not on file  . Number of children: Not on file  . Years of education: Not on file  . Highest education level: Not on file  Occupational History  . Not on file  Tobacco Use  . Smoking status: Former Smoker    Packs/day: 0.50    Years: 15.00    Pack years: 7.50    Types: Cigarettes    Quit date: 08/28/1993    Years since quitting: 26.9  . Smokeless tobacco: Never Used  Vaping Use  . Vaping Use: Never used  Substance and Sexual Activity  . Alcohol use: No    Alcohol/week: 0.0 standard drinks  . Drug use: No  . Sexual activity: Not Currently  Other Topics Concern  . Not on file  Social History  Narrative   Widowed since 2014   Retired Scientist, product/process development    Son and daughter   Social Determinants of Health   Financial Resource Strain: Not on file  Food Insecurity: Not on file  Transportation Needs: Not on file  Physical Activity: Not on file  Stress: Not on file  Social Connections: Not on file  Intimate Partner Violence: Not on file     Allergies  Allergen Reactions  . Augmentin [Amoxicillin-Pot Clavulanate] Itching, Rash and Other (See Comments)    PT DEVELOPED SEVERE RASH INVOLVING MUCUS MEMBRANES or SKIN NECROSIS WITH PENICILLINS: #  #  #  YES  #  #  #    . Penicillins Hives and Other (See Comments)    Has patient had a PCN reaction causing immediate rash, facial/tongue/throat swelling, SOB or lightheadedness with hypotension: No HAS PT DEVELOPED SEVERE RASH INVOLVING MUCUS MEMBRANES or SKIN NECROSIS: #  #  #  YES  #  #  #   Has patient had a PCN reaction that required hospitalization: No Has patient had a PCN reaction occurring within the last 10 years: No   . Aspirin Hives  . Chlorhexidine Gluconate Rash  . Chocolate Hives and Other (See Comments)    RUNNY NOSE   . Coffee Bean Extract Hives and Other (See Comments)    RUNNY NOSE   . Ibuprofen Hives and Other (See Comments)    RUNNY NOSE  . Oxycodone Hcl Hives and Nausea And Vomiting  . Shrimp [Shellfish Allergy] Hives and Other (See Comments)    RUNNY NOSE   . Sulfonamide Derivatives Hives  . Lipitor [Atorvastatin Calcium]     Leg Pain/Joint Pain/Back Pain     Outpatient Medications Prior to Visit  Medication Sig Dispense Refill  . albuterol (VENTOLIN HFA) 108 (90 Base) MCG/ACT inhaler INHALE 2 PUFFS INTO THE LUNGS EVERY 6 HOURS AS NEEDED FOR SHORTNESS OF BREATH AND WHEEZING. 18 g 3  . ARNICA EX Apply 1 application topically daily as needed (pain).    Marland Kitchen b complex vitamins tablet Take 1 tablet by mouth 2 (two) times daily. PLUS FOLIC ACID PLUS VIT. C    . Biotin 5000 MCG CAPS Take 5,000 mcg by mouth daily.      Marland Kitchen  Capsaicin 0.025 % PADS Apply 1 application topically daily as needed (pain).    . Cholecalciferol (VITAMIN D-3) 5000 UNITS TABS Take 5,000 Units by mouth daily.     . clopidogrel (PLAVIX) 75 MG tablet TAKE 1 TABLET BY MOUTH DAILY. 90 tablet 3  . CO-ENZYME Q10-VITAMIN E PO Take 1 tablet by mouth daily.    . Diclofenac Sodium (PENNSAID) 2 % SOLN Apply 1 application topically 3 (three) times daily. 112 g 0  . diphenhydrAMINE (BENADRYL) 25 MG tablet Take 25 mg by mouth every 6 (six) hours as needed. Patient taking 1/2 (12.5 mg) as needed for allergies and itching    . escitalopram (LEXAPRO) 10 MG tablet TAKE 1 TABLET ONCE DAILY. 90 tablet 1  . fluticasone (FLONASE) 50 MCG/ACT nasal spray Place 1 spray into both nostrils daily. 16 g 2  . fluticasone-salmeterol (ADVAIR HFA) 230-21 MCG/ACT inhaler Inhale 2 puffs into the lungs 2 (two) times daily. 1 each 6  . hydrochlorothiazide (MICROZIDE) 12.5 MG capsule Take 1 capsule (12.5 mg total) by mouth 2 (two) times daily. 180 capsule 3  . loratadine (CLARITIN) 10 MG tablet Take 10 mg by mouth daily as needed for allergies.    . Misc Natural Products (OSTEO BI-FLEX ADV TRIPLE ST PO) Take 1 tablet by mouth daily.    . ondansetron (ZOFRAN ODT) 4 MG disintegrating tablet Take 1 tablet (4 mg total) by mouth every 8 (eight) hours as needed for nausea or vomiting. 20 tablet 1  . pantoprazole (PROTONIX) 40 MG tablet Take 1 tablet (40 mg total) by mouth daily. 90 tablet 3  . Polyethyl Glycol-Propyl Glycol (SYSTANE OP) Place 1 drop into both eyes daily as needed. Dry eyes    . potassium chloride SA (KLOR-CON) 20 MEQ tablet Take 3 tablets (60 mEq total) by mouth daily. 270 tablet 3  . Probiotic Product (ALIGN PO) Take 1 tablet by mouth daily.    . Selenium 200 MCG CAPS Take 200 mcg by mouth 2 (two) times daily.     . traMADol (ULTRAM) 50 MG tablet Take 1 tablet (50 mg total) by mouth every 6 (six) hours as needed. (Patient taking differently: Take 50 mg by mouth every  6 (six) hours as needed for severe pain.) 30 tablet   . Trolamine Salicylate (ASPERCREME EX) Apply 1 application topically daily as needed (pain).    Marland Kitchen amLODipine (NORVASC) 10 MG tablet Take 1 tablet (10 mg total) by mouth daily. 90 tablet 3  . simvastatin (ZOCOR) 10 MG tablet Take 1 tablet (10 mg total) by mouth 2 (two) times a week. TAKE (1) TABLET TWO TIMES A WEEK. 48 tablet 3   No facility-administered medications prior to visit.    Review of Systems  Constitutional: Negative for chills, fever, malaise/fatigue and weight loss.  HENT: Positive for congestion.   Respiratory: Positive for cough and sputum production. Negative for hemoptysis, shortness of breath, wheezing and stridor.   Cardiovascular: Negative for chest pain, palpitations, orthopnea, claudication, leg swelling and PND.  Gastrointestinal: Positive for heartburn. Negative for abdominal pain, constipation, diarrhea, nausea and vomiting.  Genitourinary: Negative.   Musculoskeletal: Negative.   Skin: Negative for rash.  Neurological: Negative.   Endo/Heme/Allergies: Negative.   Psychiatric/Behavioral: Negative.    Objective:   Vitals:   07/07/20 1355  Pulse: 89  Temp: 98.4 F (36.9 C)  TempSrc: Temporal  SpO2: 96%  Weight: 146 lb 12.8 oz (66.6 kg)  Height: 5' 3.5" (1.613 m)     Physical Exam Constitutional:  General: She is not in acute distress.    Appearance: She is normal weight. She is not ill-appearing.  HENT:     Head: Normocephalic and atraumatic.  Eyes:     General: No scleral icterus.    Conjunctiva/sclera: Conjunctivae normal.     Pupils: Pupils are equal, round, and reactive to light.  Cardiovascular:     Rate and Rhythm: Normal rate and regular rhythm.     Pulses: Normal pulses.     Heart sounds: Normal heart sounds. No murmur heard.   Pulmonary:     Effort: Pulmonary effort is normal.     Breath sounds: No wheezing, rhonchi or rales.  Abdominal:     General: Bowel sounds are normal.      Palpations: Abdomen is soft.  Musculoskeletal:     Right lower leg: No edema.     Left lower leg: No edema.  Skin:    General: Skin is warm and dry.  Neurological:     General: No focal deficit present.     Mental Status: She is alert.  Psychiatric:        Mood and Affect: Mood normal.        Behavior: Behavior normal.        Thought Content: Thought content normal.        Judgment: Judgment normal.     CBC    Component Value Date/Time   WBC 7.6 05/05/2020 1542   RBC 4.41 05/05/2020 1542   HGB 13.0 05/05/2020 1542   HGB WILL FOLLOW 07/27/2017 1647   HGB 13.1 10/06/2014 1036   HCT 38.6 05/05/2020 1542   HCT WILL FOLLOW 07/27/2017 1647   HCT 38.7 10/06/2014 1036   PLT 242.0 05/05/2020 1542   PLT WILL FOLLOW 07/27/2017 1647   MCV 87.6 05/05/2020 1542   MCV WILL FOLLOW 07/27/2017 1647   MCV 89.5 10/06/2014 1036   MCH 29.4 02/13/2020 1800   MCHC 33.7 05/05/2020 1542   RDW 13.9 05/05/2020 1542   RDW WILL FOLLOW 07/27/2017 1647   RDW 14.1 10/06/2014 1036   LYMPHSABS 2.5 05/05/2020 1542   LYMPHSABS WILL FOLLOW 07/27/2017 1647   LYMPHSABS 1.8 10/06/2014 1036   MONOABS 0.5 05/05/2020 1542   MONOABS 0.5 10/06/2014 1036   EOSABS 0.1 05/05/2020 1542   EOSABS WILL FOLLOW 07/27/2017 1647   BASOSABS 0.1 05/05/2020 1542   BASOSABS WILL FOLLOW 07/27/2017 1647   BASOSABS 0.1 10/06/2014 1036   BMP Latest Ref Rng & Units 05/05/2020 02/13/2020 10/01/2019  Glucose 70 - 99 mg/dL 77 76 107(H)  BUN 6 - 23 mg/dL _0 Creatinine 0.40 - 1.20 mg/dL 0.98 1.05(H) 0.87  Sodium 135 - 145 mEq/L 139 140 140  Potassium 3.5 - 5.1 mEq/L 4.2 4.1 4.5  Chloride 96 - 112 mEq/L 100 104 106  CO2 19 - 32 mEq/L _1 Calcium 8.4 - 10.5 mg/dL 9.7 9.8 9.7   Chest imaging: CXR 02/13/20 The lungs are clear. Heart size is normal. No pneumothorax or pleural effusion. Aortic atherosclerosis. Surgical clips projecting over the right breast and chronic deformity of left ribs again seen.  PFT: PFT  Results Latest Ref Rng & Units 05/26/2020  FVC-Pre L 2.41  FVC-Predicted Pre % 90  FVC-Post L 2.49  FVC-Predicted Post % 93  Pre FEV1/FVC % % 81  Post FEV1/FCV % % 80  FEV1-Pre L 1.95  FEV1-Predicted Pre % 97  FEV1-Post L 1.99  DLCO uncorrected ml/min/mmHg 16.91  DLCO UNC% %  90  DLCO corrected ml/min/mmHg 16.91  DLCO COR %Predicted % 90  DLVA Predicted % 104  TLC L 4.79  TLC % Predicted % 96  RV % Predicted % 95  05/26/20: Normal PFTs    Assessment & Plan:   Mild persistent reactive airway disease without complication - Plan: montelukast (SINGULAIR) 10 MG tablet  Post-nasal drip - Plan: ipratropium (ATROVENT) 0.03 % nasal spray  Discussion: Alexandria [name] is a 80 year old woman, former smoker with history of asthma who returns to pulmonary clinic for follow up after pulmonary function testing.  She has persistent reactive airways disease which seem to be aggravated by her GERD and post-nasal drainage.   She is to continue on pantoprazole 42m daily for GERD.   She is to use flonase 2 sprays per nostril daily, start ipratropium nasal spray 2 sprays per nostril twice daily. She is to start montelukast 1103mdaily. She is to continue her Advair 230-214m2 puffs twice daily and as needed albuterol.   In regards to her upcoming surgical planning, her ARISCAT preoperative pulmonary risk index is 1.6% placing her at low risk for pulmonary related complications.  Follow up in 6 months.  JonFreda JacksonD [facility] Office: [phone] Current Outpatient Medications:  .  albuterol (VENTOLIN HFA) 108 (90 Base) MCG/ACT inhaler, INHALE 2 PUFFS INTO THE LUNGS EVERY 6 HOURS AS NEEDED FOR SHORTNESS OF BREATH AND WHEEZING., Disp: 18 g, Rfl: 3 .  ARNICA EX, Apply 1 application topically daily as needed (pain)., Disp: , Rfl:  .  b complex vitamins tablet, Take 1 tablet by mouth 2 (two) times daily. PLUS FOLIC ACID PLUS VIT. C, Disp: , Rfl:  .  Biotin 5000 MCG  CAPS, Take 5,000 mcg by mouth daily. , Disp: , Rfl:  .  Capsaicin 0.025 % PADS, Apply 1 application topically daily as needed (pain)., Disp: , Rfl:  .  Cholecalciferol (VITAMIN D-3) 5000 UNITS TABS, Take 5,000 Units by mouth daily. , Disp: , Rfl:  .  clopidogrel (PLAVIX) 75 MG tablet, TAKE 1 TABLET BY MOUTH DAILY., Disp: 90 tablet, Rfl: 3 .  CO-ENZYME Q10-VITAMIN E PO, Take 1 tablet by mouth daily., Disp: , Rfl:  .  Diclofenac Sodium (PENNSAID) 2 % SOLN, Apply 1 application topically 3 (three) times daily., Disp: 112 g, Rfl: 0 .  diphenhydrAMINE (BENADRYL) 25 MG tablet, Take 25 mg by mouth every 6 (six) hours as needed. Patient taking 1/2 (12.5 mg) as needed for allergies and itching, Disp: , Rfl:  .  escitalopram (LEXAPRO) 10 MG tablet, TAKE 1 TABLET ONCE DAILY., Disp: 90 tablet, Rfl: 1 .  fluticasone (FLONASE) 50 MCG/ACT nasal spray, Place 1 spray into both nostrils daily., Disp: 16 g, Rfl: 2 .  fluticasone-salmeterol (ADVAIR HFA) 230-21 MCG/ACT inhaler, Inhale 2 puffs into the lungs 2 (two) times daily., Disp: 1 each, Rfl: 6 .  hydrochlorothiazide (MICROZIDE) 12.5 MG capsule, Take 1 capsule (12.5 mg total) by mouth 2 (two) times daily., Disp: 180 capsule, Rfl: 3 .  ipratropium (ATROVENT) 0.03 % nasal spray, Place 2 sprays into both nostrils every 12 (twelve) hours., Disp: 30 mL, Rfl: 12 .  loratadine (CLARITIN) 10 MG tablet, Take 10 mg by mouth daily as needed for allergies., Disp: , Rfl:  .  Misc Natural Products (OSTEO BI-FLEX ADV TRIPLE ST PO), Take 1 tablet by mouth daily., Disp: , Rfl:  .  montelukast (SINGULAIR) 10 MG tablet, Take 1 tablet (10 mg total)  by mouth at bedtime., Disp: 30 tablet, Rfl: 11 .  ondansetron (ZOFRAN ODT) 4 MG disintegrating tablet, Take 1 tablet (4 mg total) by mouth every 8 (eight) hours as needed for nausea or vomiting., Disp: 20 tablet, Rfl: 1 .  pantoprazole (PROTONIX) 40 MG tablet, Take 1 tablet (40 mg total) by mouth daily., Disp: 90 tablet, Rfl: 3 .  Polyethyl  Glycol-Propyl Glycol (SYSTANE OP), Place 1 drop into both eyes daily as needed. Dry eyes, Disp: , Rfl:  .  potassium chloride SA (KLOR-CON) 20 MEQ tablet, Take 3 tablets (60 mEq total) by mouth daily., Disp: 270 tablet, Rfl: 3 .  Probiotic Product (ALIGN PO), Take 1 tablet by mouth daily., Disp: , Rfl:  .  Selenium 200 MCG CAPS, Take 200 mcg by mouth 2 (two) times daily. , Disp: , Rfl:  .  traMADol (ULTRAM) 50 MG tablet, Take 1 tablet (50 mg total) by mouth every 6 (six) hours as needed. (Patient taking differently: Take 50 mg by mouth every 6 (six) hours as needed for severe pain.), Disp: 30 tablet, Rfl:  .  Trolamine Salicylate (ASPERCREME EX), Apply 1 application topically daily as needed (pain)., Disp: , Rfl:  .  amLODipine (NORVASC) 10 MG tablet, Take 1 tablet (10 mg total) by mouth daily., Disp: 90 tablet, Rfl: 3 .  simvastatin (ZOCOR) 10 MG tablet, Take 1 tablet (10 mg total) by mouth 2 (two) times a week. TAKE (1) TABLET TWO TIMES A WEEK., Disp: 48 tablet, Rfl: 3

## 2020-07-09 ENCOUNTER — Ambulatory Visit (INDEPENDENT_AMBULATORY_CARE_PROVIDER_SITE_OTHER): Payer: Medicare Other | Admitting: Vascular Surgery

## 2020-07-09 ENCOUNTER — Other Ambulatory Visit: Payer: Self-pay

## 2020-07-09 ENCOUNTER — Encounter: Payer: Self-pay | Admitting: Vascular Surgery

## 2020-07-09 VITALS — BP 144/84 | HR 76 | Temp 98.1°F | Resp 14 | Ht 63.5 in | Wt 145.0 lb

## 2020-07-09 DIAGNOSIS — I872 Venous insufficiency (chronic) (peripheral): Secondary | ICD-10-CM

## 2020-07-09 NOTE — Progress Notes (Signed)
REASON FOR VISIT:   Follow-up of chronic venous insufficiency.  MEDICAL ISSUES:   CHRONIC VENOUS INSUFFICIENCY: This patient has some known deep venous reflux on the right.  Her superficial venous reflux has previously been treated by Dr. Kellie Simmering.  She has no significant deep or superficial venous reflux on the left side.  We have again discussed the importance of intermittent leg elevation and the proper positioning for this.  I encouraged her to continue to wear her compression stockings.  We discussed the importance of exercise specifically walking and water aerobics.  I have encouraged her to avoid prolonged sitting and standing.  She needs physical therapy after her recent fall and there is no contraindications from a vascular standpoint.  I will be happy to see her back at any time if her venous symptoms progress.    HPI:   Alexandria [name] is a pleasant 80 y.o. female who I last saw on 03/04/2020 for follow-up of her chronic venous insufficiency.  She has CEAP C3 venous disease.  I had originally seen her a year ago with phlebitis in the right thigh.  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  She had deep venous reflux on the right.  We discussed importance of leg elevation and also we encouraged her to continue to wear her compression stockings.  We also discussed the importance of exercise.  I encouraged her to avoid prolonged sitting and standing.  She comes back in for a routine follow-up visit.  Of note, this patient has had previous laser ablation of the right great saphenous vein and right small saphenous vein by Dr. Kellie Simmering.  Since I saw her last she is continue to have some bilateral leg pain.  However her symptoms have not changed significantly.  She also describes some swelling in her legs at times.  She fell on 05/20/2020 and injured her left rotator cuff.  She subsequent developed some phlebitis in her medial right thigh after that and this has improved.  She  describes some aching pain in her legs which is aggravated by standing and relieved with elevation.  She does wear compression stockings some.  These also help her symptoms somewhat.  Past Medical History:  Diagnosis Date  . Allergy   . Anemia    hx of   . Anxiety   . Arthritis    left knee, hands, back  . Asthma    daily and prn inhalers  . 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 problems with balance  . Breast cancer ([place]) 12/2011   right, ER+, PR -, Her 2 -  . COPD (chronic obstructive pulmonary disease) ([place])   . Degenerative joint disease of low back   . Depression   . Dysrhythmia    atrial fib   . GERD (gastroesophageal reflux disease)   . Hearing loss    bilateral hearing aids  . 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 History of kidney stones   . Hx of radiation therapy 03/12/12 -04/13/12   right breast  . Hyperlipidemia   . Hypertension    under control, has been on med. since age 52  . IBS (irritable bowel syndrome)   . Long term current use of aromatase inhibitor 05/08/2012  . Overactive bladder   . Palpitations   . Pneumonia    hx of x 2   . PONV (postoperative nausea and vomiting)   . PVD (  peripheral vascular disease) ([place])    varicose veins - left worse than right  . Spinal stenosis   . Stress incontinence   . 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 Traumatic hemopneumothorax 1982   left    Family History  Problem Relation Age of Onset  . Cancer Maternal Aunt        breast  . Cancer Paternal Aunt        Multiple Myeloma  . Stomach cancer Paternal Uncle   . Breast cancer Paternal Uncle   . Breast cancer Cousin        early 86s  . Breast cancer Paternal Aunt        late 19s-60s  . Stroke Mother   . Heart attack Father        Died age 50  . Heart attack Paternal Grandfather   . Breast cancer Paternal Aunt        bilateral breast cancer  . Dementia Paternal  Aunt   . Congestive Heart Failure Paternal Uncle        Ischemic  . Congestive Heart Failure Paternal Uncle        Ischemic    SOCIAL HISTORY: Social History   Tobacco Use  . Smoking status: Former Smoker    Packs/day: 0.50    Years: 15.00    Pack years: 7.50    Types: Cigarettes    Quit date: 08/28/1993    Years since quitting: 26.8  . Smokeless tobacco: Never Used  Substance Use Topics  . Alcohol use: No    Alcohol/week: 0.0 standard drinks    Allergies  Allergen Reactions  . Augmentin [Amoxicillin-Pot Clavulanate] Itching, Rash and Other (See Comments)    PT DEVELOPED SEVERE RASH INVOLVING MUCUS MEMBRANES or SKIN NECROSIS WITH PENICILLINS: #  #  #  YES  #  #  #    . Penicillins Hives and Other (See Comments)    Has patient had a PCN reaction causing immediate rash, facial/tongue/throat swelling, SOB or lightheadedness with hypotension: No HAS PT DEVELOPED SEVERE RASH INVOLVING MUCUS MEMBRANES or SKIN NECROSIS: #  #  #  YES  #  #  #   Has patient had a PCN reaction that required hospitalization: No Has patient had a PCN reaction occurring within the last 10 years: No   . Aspirin Hives  . Chlorhexidine Gluconate Rash  . Chocolate Hives and Other (See Comments)    RUNNY NOSE   . Coffee Bean Extract Hives and Other (See Comments)    RUNNY NOSE   . Ibuprofen Hives and Other (See Comments)    RUNNY NOSE  . Oxycodone Hcl Hives and Nausea And Vomiting  . Shrimp [Shellfish Allergy] Hives and Other (See Comments)    RUNNY NOSE   . Sulfonamide Derivatives Hives  . Lipitor [Atorvastatin Calcium]     Leg Pain/Joint Pain/Back Pain    Current Outpatient Medications  Medication Sig Dispense Refill  . albuterol (VENTOLIN HFA) 108 (90 Base) MCG/ACT inhaler INHALE 2 PUFFS INTO THE LUNGS EVERY 6 HOURS AS NEEDED FOR SHORTNESS OF BREATH AND WHEEZING. 18 g 3  . ARNICA EX Apply 1 application topically daily as needed (pain).    Marland Kitchen b complex vitamins tablet Take 1 tablet by mouth 2  (two) times daily. PLUS FOLIC ACID PLUS VIT. C    . Biotin 5000 MCG CAPS Take 5,000 mcg by mouth daily.     . Capsaicin 0.025 % PADS Apply 1  application topically daily as needed (pain).    . Cholecalciferol (VITAMIN D-3) 5000 UNITS TABS Take 5,000 Units by mouth daily.     . clopidogrel (PLAVIX) 75 MG tablet TAKE 1 TABLET BY MOUTH DAILY. 90 tablet 3  . CO-ENZYME Q10-VITAMIN E PO Take 1 tablet by mouth daily.    . Diclofenac Sodium (PENNSAID) 2 % SOLN Apply 1 application topically 3 (three) times daily. 112 g 0  . diphenhydrAMINE (BENADRYL) 25 MG tablet Take 25 mg by mouth every 6 (six) hours as needed. Patient taking 1/2 (12.5 mg) as needed for allergies and itching    . escitalopram (LEXAPRO) 10 MG tablet TAKE 1 TABLET ONCE DAILY. 90 tablet 1  . fluticasone (FLONASE) 50 MCG/ACT nasal spray Place 1 spray into both nostrils daily. 16 g 2  . fluticasone-salmeterol (ADVAIR HFA) 230-21 MCG/ACT inhaler Inhale 2 puffs into the lungs 2 (two) times daily. 1 each 6  . hydrochlorothiazide (MICROZIDE) 12.5 MG capsule Take 1 capsule (12.5 mg total) by mouth 2 (two) times daily. 180 capsule 3  . ipratropium (ATROVENT) 0.03 % nasal spray Place 2 sprays into both nostrils every 12 (twelve) hours. 30 mL 12  . loratadine (CLARITIN) 10 MG tablet Take 10 mg by mouth daily as needed for allergies.    . Misc Natural Products (OSTEO BI-FLEX ADV TRIPLE ST PO) Take 1 tablet by mouth daily.    . montelukast (SINGULAIR) 10 MG tablet Take 1 tablet (10 mg total) by mouth at bedtime. 30 tablet 11  . ondansetron (ZOFRAN ODT) 4 MG disintegrating tablet Take 1 tablet (4 mg total) by mouth every 8 (eight) hours as needed for nausea or vomiting. 20 tablet 1  . pantoprazole (PROTONIX) 40 MG tablet Take 1 tablet (40 mg total) by mouth daily. 90 tablet 3  . Polyethyl Glycol-Propyl Glycol (SYSTANE OP) Place 1 drop into both eyes daily as needed. Dry eyes    . potassium chloride SA (KLOR-CON) 20 MEQ tablet Take 3 tablets (60 mEq  total) by mouth daily. 270 tablet 3  . Probiotic Product (ALIGN PO) Take 1 tablet by mouth daily.    . Selenium 200 MCG CAPS Take 200 mcg by mouth 2 (two) times daily.     . traMADol (ULTRAM) 50 MG tablet Take 1 tablet (50 mg total) by mouth every 6 (six) hours as needed. (Patient taking differently: Take 50 mg by mouth every 6 (six) hours as needed for severe pain.) 30 tablet   . Trolamine Salicylate (ASPERCREME EX) Apply 1 application topically daily as needed (pain).    Marland Kitchen amLODipine (NORVASC) 10 MG tablet Take 1 tablet (10 mg total) by mouth daily. 90 tablet 3  . simvastatin (ZOCOR) 10 MG tablet Take 1 tablet (10 mg total) by mouth 2 (two) times a week. TAKE (1) TABLET TWO TIMES A WEEK. 48 tablet 3   No current facility-administered medications for this visit.    REVIEW OF SYSTEMS:  [X]  denotes positive finding, [ ]  denotes negative finding Cardiac  Comments:  Chest pain or chest pressure:    Shortness of breath upon exertion:    Short of breath when lying flat:    Irregular heart rhythm:        Vascular    Pain in calf, thigh, or hip brought on by ambulation:    Pain in feet at night that wakes you up from your sleep:     Blood clot in your veins:    Leg swelling:  x  Pulmonary    Oxygen at home:    Productive cough:     Wheezing:         Neurologic    Sudden weakness in arms or legs:     Sudden numbness in arms or legs:     Sudden onset of difficulty speaking or slurred speech:    Temporary loss of vision in one eye:     Problems with dizziness:         Gastrointestinal    Blood in stool:     Vomited blood:         Genitourinary    Burning when urinating:     Blood in urine:        Psychiatric    Major depression:         Hematologic    Bleeding problems:    Problems with blood clotting too easily:        Skin    Rashes or ulcers:        Constitutional    Fever or chills:     PHYSICAL EXAM:   Vitals:   07/09/20 1436  BP: (!) 144/84  Pulse: 76   Resp: 14  Temp: 98.1 F (36.7 C)  TempSrc: Temporal  SpO2: 97%  Weight: 145 lb (65.8 kg)  Height: 5' 3.5" (1.613 m)    GENERAL: The patient is a well-nourished female, in no acute distress. The vital signs are documented above. CARDIAC: There is a regular rate and rhythm.  VASCULAR: I do not detect carotid bruits. She has palpable dorsalis pedis pulses bilaterally. She has varicose veins and spider veins bilaterally as documented in the photograph below. She has no significant lower extremity swelling currently. There is some mild phlebitis in a varicose vein in the medial right thigh.    PULMONARY: There is good air exchange bilaterally without wheezing or rales. ABDOMEN: Soft and non-tender with normal pitched bowel sounds.  MUSCULOSKELETAL: There are no major deformities or cyanosis. NEUROLOGIC: No focal weakness or paresthesias are detected. SKIN: There are no ulcers or rashes noted. PSYCHIATRIC: The patient has a normal affect.  DATA:    No new data  [name] [facility] [phone]

## 2020-07-16 ENCOUNTER — Encounter: Payer: Self-pay | Admitting: Pulmonary Disease

## 2020-08-18 ENCOUNTER — Telehealth: Payer: Self-pay | Admitting: Adult Health

## 2020-08-18 DIAGNOSIS — I639 Cerebral infarction, unspecified: Secondary | ICD-10-CM

## 2020-08-18 DIAGNOSIS — I10 Essential (primary) hypertension: Secondary | ICD-10-CM

## 2020-08-18 NOTE — Telephone Encounter (Signed)
Pt is calling in needing a referral to [facility] at [facility] [place], [place] [address]  [phone].  Pt is needing a referral to get a female Cardiologist per [facility] due to thier protocol pt need a referral to be part of the new facility.

## 2020-08-19 NOTE — Telephone Encounter (Signed)
Okay for referral?

## 2020-08-19 NOTE — Telephone Encounter (Signed)
Message routed to PCP.

## 2020-08-21 NOTE — Telephone Encounter (Signed)
Noted! Referral placed. Tried to call pt to advised that referral has been placed but no answer.

## 2020-08-21 NOTE — Telephone Encounter (Signed)
Pt notified of update and verbalized of update.

## 2020-08-21 NOTE — Addendum Note (Signed)
Addended by: Gwenyth Ober R on: 08/21/2020 10:07 AM   Modules accepted: Orders

## 2020-08-25 ENCOUNTER — Other Ambulatory Visit: Payer: Self-pay | Admitting: Adult Health

## 2020-08-25 DIAGNOSIS — K219 Gastro-esophageal reflux disease without esophagitis: Secondary | ICD-10-CM

## 2020-09-17 ENCOUNTER — Other Ambulatory Visit: Payer: Self-pay | Admitting: Adult Health

## 2020-09-17 DIAGNOSIS — I10 Essential (primary) hypertension: Secondary | ICD-10-CM

## 2020-09-22 ENCOUNTER — Other Ambulatory Visit: Payer: Self-pay | Admitting: Adult Health

## 2020-10-27 ENCOUNTER — Other Ambulatory Visit: Payer: Self-pay | Admitting: Adult Health

## 2020-10-27 DIAGNOSIS — I10 Essential (primary) hypertension: Secondary | ICD-10-CM

## 2020-10-30 ENCOUNTER — Other Ambulatory Visit: Payer: Self-pay

## 2020-10-30 ENCOUNTER — Telehealth: Payer: Self-pay | Admitting: Pulmonary Disease

## 2020-10-30 DIAGNOSIS — J449 Chronic obstructive pulmonary disease, unspecified: Secondary | ICD-10-CM

## 2020-10-30 MED ORDER — ALBUTEROL SULFATE HFA 108 (90 BASE) MCG/ACT IN AERS: INHALATION_SPRAY | RESPIRATORY_TRACT | 3 refills | Status: DC

## 2020-10-30 MED ORDER — ALBUTEROL SULFATE HFA 108 (90 BASE) MCG/ACT IN AERS: 2.0000 | INHALATION_SPRAY | Freq: Four times a day (QID) | RESPIRATORY_TRACT | 5 refills | Status: DC | PRN

## 2020-10-30 NOTE — Telephone Encounter (Signed)
Call returned to pharmacy, confirmed patient. Requesting refill of ventolin. Refill sent.   Nothing further needed at this time.

## 2020-11-26 ENCOUNTER — Ambulatory Visit (INDEPENDENT_AMBULATORY_CARE_PROVIDER_SITE_OTHER): Payer: Medicare Other | Admitting: Cardiovascular Disease

## 2020-11-26 ENCOUNTER — Other Ambulatory Visit: Payer: Self-pay

## 2020-11-26 ENCOUNTER — Encounter (HOSPITAL_BASED_OUTPATIENT_CLINIC_OR_DEPARTMENT_OTHER): Payer: Self-pay | Admitting: Cardiovascular Disease

## 2020-11-26 VITALS — BP 126/76 | HR 80 | Ht 63.5 in | Wt 150.9 lb

## 2020-11-26 DIAGNOSIS — R0789 Other chest pain: Secondary | ICD-10-CM

## 2020-11-26 DIAGNOSIS — I639 Cerebral infarction, unspecified: Secondary | ICD-10-CM | POA: Diagnosis not present

## 2020-11-26 DIAGNOSIS — R072 Precordial pain: Secondary | ICD-10-CM | POA: Diagnosis not present

## 2020-11-26 DIAGNOSIS — I10 Essential (primary) hypertension: Secondary | ICD-10-CM | POA: Diagnosis not present

## 2020-11-26 DIAGNOSIS — E785 Hyperlipidemia, unspecified: Secondary | ICD-10-CM

## 2020-11-26 DIAGNOSIS — M79605 Pain in left leg: Secondary | ICD-10-CM

## 2020-11-26 DIAGNOSIS — M7989 Other specified soft tissue disorders: Secondary | ICD-10-CM

## 2020-11-26 DIAGNOSIS — I83893 Varicose veins of bilateral lower extremities with other complications: Secondary | ICD-10-CM

## 2020-11-26 HISTORY — DX: Other chest pain: R07.89

## 2020-11-26 MED ORDER — PREDNISONE 50 MG PO TABS: ORAL_TABLET | ORAL | 0 refills | Status: DC

## 2020-11-26 MED ORDER — METOPROLOL TARTRATE 100 MG PO TABS: ORAL_TABLET | ORAL | 0 refills | Status: DC

## 2020-11-26 NOTE — Progress Notes (Signed)
Cardiology Clinic Note    Date:  11/26/2020   ID:  SHAREA BOGGS, DOB 05/06/1940, MRN [id number]  PCP:  [name], NP  Cardiologist:  None  Nephrologist:  Referring MD: [name], NP   CC: Hypertension  History of Present Illness:    Alexandria [name] is a 80 y.o. female with a hx of anemia, arthritis, anxiety, asthma, acute brainstem infarct, right breast cancer s/p radiation therapy, COPD, DJD, depression, atrial fibrillation, GERD, hearing loss, glomerulonephritis, hyperlipidemia, hypertension, IBS, pneumonia, PVD (varicose veins), spinal stenosis, and left traumatic hemopneumothorax, here to establish care in the [facility]. She was last seen in Cardiology by Dr. Percival Spanish 05/15/2012 for evaluation of an apparent stroke. At that visit atenolol was discontinued and she was restarted on Toprol as it had been more effective. She had a nuclear stress test in 2014 that was negative.  Today, she reports she was last seen in Cardiology by Dr. Percival Spanish 8 years ago. Prior to a scheduled post-vaginal wall repair on 05/31/2020, she had a mechanical fall. She twisted her right ankle and fell flat on her driveway. Subsequently she developed three small thrombi above the ventricles in her posterior head. She had another fall, and she fell backwards on concrete and fractured her T11 vertebrae. She then tore her left rotator cuff. The post-vaginal wall repair was never completed due to these medical issues, but she wants to have this surgery. She endorses some chest pain and pressure that she attributes to GERD. However, she also has exertional chest pain. Mostly when she is walking up and down a hill to get the garbage cans. She brings up the empty cans halfway, rests, and then continues. The chest pain seems to have improved slightly due to the cooler weather. Of note, she attributes this exertional pain to a severe accident in 1982 causing traumatic arthritis. She reports orthopnea  since developing a thrombus in her right LE. Currently she notes 2 thrombi in her lower left LE present for two weeks that she believes was caused by her compression socks. Currently she is only wearing a compression sock on her right LE. Also complains of numbness and tingling in her hands. She has not seen a neurologist in a long time. For her diet, she typically adds cinnamon, tumeric, and ginger in her coffee. At the beginning of this summer she weighed 138 pounds. Previously, lipitor caused LE muscle cramps. She denies any palpitations. No lightheadedness, headaches, syncope, or PND.    Past Medical History:  Diagnosis Date   Allergy    Anemia    hx of    Anxiety    Arthritis    left knee, hands, back   Asthma    daily and prn inhalers   Brainstem infarct, acute ([place]) 03/2011   slight expressive aphasia, occ. problems with balance   Breast cancer ([place]) 12/2011   right, ER+, PR -, Her 2 -   COPD (chronic obstructive pulmonary disease) (HCC)    Degenerative joint disease of low back    Depression    Dysrhythmia    atrial fib    GERD (gastroesophageal reflux disease)    Hearing loss    bilateral hearing aids   History of glomerulonephritis as a child   and had abscess left kidney   History of kidney stones    Hx of radiation therapy 03/12/12 -04/13/12   right breast   Hyperlipidemia    Hypertension    under control, has been on  med. since age 30   IBS (irritable bowel syndrome)    Long term current use of aromatase inhibitor 05/08/2012   Overactive bladder    Palpitations    Pneumonia    hx of x 2    PONV (postoperative nausea and vomiting)    PVD (peripheral vascular disease) (HCC)    varicose veins - left worse than right   Spinal stenosis    Stress incontinence    Stroke [facility])    problem with expressive communicaiton   Traumatic hemopneumothorax 1982   left    Past Surgical History:  Procedure Laterality Date   ANKLE HARDWARE REMOVAL     right   BREAST BIOPSY   1976   right   BREAST BIOPSY Left 2019   benign lymph node   BREAST LUMPECTOMY  2013   right with snbx   CHOLECYSTECTOMY  1990s   COLONOSCOPY     CYSTOSCOPY  07/05/2005   KNEE SURGERY  1980s   left   KYPHOPLASTY N/A 06/15/2017   Procedure: KYPHOPLASTY T11;  Surgeon: Melina Schools, MD;  Location: [facility];  Service: Orthopedics;  Laterality: N/A;  90 mins   ORIF ANKLE FRACTURE  1980s   right    TOTAL KNEE ARTHROPLASTY Left 12/08/2015   Procedure: LEFT TOTAL KNEE ARTHROPLASTY;  Surgeon: Paralee Cancel, MD;  Location: [facility];  Service: Orthopedics;  Laterality: Left;   TRANSOBTURATOR SLING  07/05/2005   TUBAL LIGATION  1976   VEIN SURGERY     vein ablation left leg    Current Medications: Current Meds  Medication Sig   albuterol (PROAIR HFA) 108 (90 Base) MCG/ACT inhaler Inhale 2 puffs into the lungs every 6 (six) hours as needed for wheezing or shortness of breath.   amLODipine (NORVASC) 10 MG tablet TAKE 1 TABLET EACH DAY.   ARNICA EX Apply 1 application topically daily as needed (pain).   b complex vitamins tablet Take 1 tablet by mouth 2 (two) times daily. PLUS FOLIC ACID PLUS VIT. C   Biotin 5000 MCG CAPS Take 5,000 mcg by mouth daily.    Capsaicin 0.025 % PADS Apply 1 application topically daily as needed (pain).   Cholecalciferol (VITAMIN D-3) 5000 UNITS TABS Take 5,000 Units by mouth daily.    clopidogrel (PLAVIX) 75 MG tablet TAKE 1 TABLET BY MOUTH DAILY.   CO-ENZYME Q10-VITAMIN E PO Take 1 tablet by mouth daily.   Diclofenac Sodium (PENNSAID) 2 % SOLN Apply 1 application topically 3 (three) times daily.   diphenhydrAMINE (BENADRYL) 25 MG tablet Take 25 mg by mouth every 6 (six) hours as needed. Patient taking 1/2 (12.5 mg) as needed for allergies and itching   escitalopram (LEXAPRO) 10 MG tablet TAKE 1 TABLET ONCE DAILY.   fluticasone (FLONASE) 50 MCG/ACT nasal spray Place 1 spray into both nostrils daily.   fluticasone-salmeterol (ADVAIR HFA) 230-21 MCG/ACT inhaler Inhale 2  puffs into the lungs 2 (two) times daily.   hydrochlorothiazide (MICROZIDE) 12.5 MG capsule TAKE (1) CAPSULE TWICE DAILY.   ipratropium (ATROVENT) 0.03 % nasal spray Place 2 sprays into both nostrils every 12 (twelve) hours.   loratadine (CLARITIN) 10 MG tablet Take 10 mg by mouth daily as needed for allergies.   metoprolol tartrate (LOPRESSOR) 100 MG tablet TAKE 1 TABLET 2 HOURS PRIOR TO CT   Misc Natural Products (OSTEO BI-FLEX ADV TRIPLE ST PO) Take 1 tablet by mouth daily.   montelukast (SINGULAIR) 10 MG tablet Take 1 tablet (10 mg total) by mouth at  bedtime.   ondansetron (ZOFRAN ODT) 4 MG disintegrating tablet Take 1 tablet (4 mg total) by mouth every 8 (eight) hours as needed for nausea or vomiting.   pantoprazole (PROTONIX) 40 MG tablet TAKE 1 TABLET BY MOUTH DAILY.   Polyethyl Glycol-Propyl Glycol (SYSTANE OP) Place 1 drop into both eyes daily as needed. Dry eyes   potassium chloride SA (KLOR-CON) 20 MEQ tablet TAKE 3 TABLETS DAILY.   predniSONE (DELTASONE) 50 MG tablet Take 1 tablet 13 hours prior to procedure, 1 tablet 7 hours prior to procedure, and 1 tablet (with Benedryl 50 mg) prior to going to the hospital for procedure   Probiotic Product (ALIGN PO) Take 1 tablet by mouth daily.   Selenium 200 MCG CAPS Take 200 mcg by mouth 2 (two) times daily.    simvastatin (ZOCOR) 10 MG tablet TAKE (1) TABLET TWO TIMES A WEEK.   Trolamine Salicylate (ASPERCREME EX) Apply 1 application topically daily as needed (pain).     Allergies:   Augmentin [amoxicillin-pot clavulanate], Penicillins, Aspirin, Chlorhexidine gluconate, Chocolate, Coffee bean extract, Ibuprofen, Oxycodone hcl, Shrimp [shellfish allergy], Sulfonamide derivatives, and Lipitor [atorvastatin calcium]   Social History   Socioeconomic History   Marital status: Widowed    Spouse name: Not on file   Number of children: Not on file   Years of education: Not on file   Highest education level: Not on file  Occupational History    Not on file  Tobacco Use   Smoking status: Former    Packs/day: 0.50    Years: 15.00    Pack years: 7.50    Types: Cigarettes    Quit date: 08/28/1993    Years since quitting: 27.2   Smokeless tobacco: Never  Vaping Use   Vaping Use: Never used  Substance and Sexual Activity   Alcohol use: No    Alcohol/week: 0.0 standard drinks   Drug use: No   Sexual activity: Not Currently  Other Topics Concern   Not on file  Social History Narrative   Widowed since 2014   Retired Scientist, product/process development    Son and daughter   Social Determinants of Health   Financial Resource Strain: Low Risk    Difficulty of Paying Living Expenses: Not hard at all  Food Insecurity: No Food Insecurity   Worried About Charity fundraiser in the Last Year: Never true   Arboriculturist in the Last Year: Never true  Transportation Needs: No Transportation Needs   Lack of Transportation (Medical): No   Lack of Transportation (Non-Medical): No  Physical Activity: Insufficiently Active   Days of Exercise per Week: 3 days   Minutes of Exercise per Session: 30 min  Stress: Not on file  Social Connections: Not on file     Family History: The patient's family history includes Breast cancer in her cousin, daughter, paternal aunt, paternal aunt, and paternal uncle; Cancer in her maternal aunt and paternal aunt; Congestive Heart Failure in her paternal uncle and paternal uncle; Dementia in her paternal aunt; Heart attack in her father and paternal grandfather; Stomach cancer in her paternal uncle; Stroke in her mother.  ROS:   Please see the history of present illness.    (+) Mechanical falls (+) Chest pain/pressure (+) Shortness of breath (+) LE muscle cramps (+) Orthopnea (+) Numbness and tingling in bilateral hands All other systems reviewed and are negative.  EKGs/Labs/Other Studies Reviewed:    LE Venous Reflux 03/04/2020: Summary:  Right:  - No evidence  of deep vein thrombosis from the common femoral  through the  popliteal veins.  - No evidence of superficial vein thrombosis.  - Popliteal cyst observed.  - The common femoral vein is incompetent.  - The great and small saphenous veins were not visualized, consistent with  history of ablation.     Left:  - No evidence of deep vein thrombosis from the common femoral through the  popliteal veins.  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  - No true great saphenous vein identified.  - The small saphenous vein is not competent.   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  - Findings consistent with age indeterminate superficial vein thrombosis  involving the right superficial veins/varciosities.  - There is no evidence of deep vein thrombosis in the lower extremity.     - No cystic structure found in the popliteal fossa.     LEFT:  - No evidence of common femoral vein obstruction.   EKG:   11/26/2020: Sinus rhythm. Rate 80 bpm.  Recent Labs: 02/13/2020: B Natriuretic Peptide 48.7 05/05/2020: ALT 12; BUN 18; Creatinine, Ser 0.98; Hemoglobin 13.0; Platelets 242.0; Potassium 4.2; Sodium 139   Recent Lipid Panel    Component Value Date/Time   CHOL 246 (H) 07/10/2019 1441   TRIG 183.0 (H) 07/10/2019 1441   HDL 50.20 07/10/2019 1441   CHOLHDL 5 07/10/2019 1441   VLDL 36.6 07/10/2019 1441   LDLCALC 159 (H) 07/10/2019 1441   LDLDIRECT 183.5 12/17/2008 0953    Physical Exam:   VS:  BP 126/76   Pulse 80   Ht 5' 3.5" (1.613 m)   Wt 150 lb 14.4 oz (68.4 kg)   SpO2 95%   BMI 26.31 kg/m  , BMI Body mass index is 26.31 kg/m. GENERAL:  Well appearing HEENT: Pupils equal round and reactive, fundi not visualized, oral mucosa unremarkable NECK:  No jugular venous distention, waveform within normal limits, carotid upstroke brisk and symmetric, no bruits LUNGS:  Clear to  auscultation bilaterally HEART:  RRR.  PMI not displaced or sustained,S1 and S2 within normal limits, no S3, no S4, no clicks, no rubs, no murmurs ABD:  Flat, positive bowel sounds normal in frequency in pitch, no bruits, no rebound, no guarding, no midline pulsatile mass, no hepatomegaly, no splenomegaly EXT:  2 plus pulses throughout, no edema, no cyanosis no clubbing.  Small, tender palpable cord at L ankle SKIN:  No rashes no nodules NEURO:  Cranial nerves II through XII grossly intact, motor grossly intact throughout PSYCH:  Cognitively intact, oriented to person place and time   ASSESSMENT/PLAN:    Essential hypertension, benign BP well-controlled on amlodipine and HCTZ.  Continue current regimen.  Stroke Occurred in the setting of a fall in 2010.  She is on clopidogrel and tolerates it well.  BP well-controlled but lipids are poorly controlled on simvastatin.   Hyperlipidemia Lipids are poorly controlled.  She didn't tolerate atorvastatin due to cramps.  She wasn't taking simvastaitn when they were last checked.  We will have her come back for fasting lipids/CMP.  Continue simvastatin for now.    Screening for Secondary Hypertension:     Relevant Labs/Studies: Basic Labs Latest Ref Rng & Units 05/05/2020 02/13/2020 10/01/2019  Sodium 135 - 145 mEq/L 139 140 140  Potassium 3.5 - 5.1 mEq/L 4.2 4.1 4.5  Creatinine 0.40 - 1.20  mg/dL 0.98 1.05(H) 0.87    Thyroid  Latest Ref Rng & Units 07/10/2019 05/30/2018  TSH 0.35 - 4.50 uIU/mL 1.84 4.21                  Disposition:    FU with Clint Strupp C. Oval Linsey, MD, [facility] in 6 months   Medication Adjustments/Labs and Tests Ordered: Current medicines are reviewed at length with the patient today.  Concerns regarding medicines are outlined above.   Orders Placed This Encounter  Procedures   CT CORONARY MORPH W/CTA COR W/SCORE W/CA W/CM &/OR WO/CM   Lipid panel   Comprehensive metabolic panel   EKG [id number]   VAS [place] LOWER EXTREMITY  VENOUS (DVT)    Meds ordered this encounter  Medications   metoprolol tartrate (LOPRESSOR) 100 MG tablet    Sig: TAKE 1 TABLET 2 HOURS PRIOR TO CT    Dispense:  1 tablet    Refill:  0   predniSONE (DELTASONE) 50 MG tablet    Sig: Take 1 tablet 13 hours prior to procedure, 1 tablet 7 hours prior to procedure, and 1 tablet (with Benedryl 50 mg) prior to going to the hospital for procedure    Dispense:  3 tablet    Refill:  0    I,Mathew Stumpf,acting as a scribe for Skeet Latch, MD.,have documented all relevant documentation on the behalf of Skeet Latch, MD,as directed by  Skeet Latch, MD while in the presence of Skeet Latch, MD.  I, [place] Oval Linsey, MD have reviewed all documentation for this visit.  The documentation of the exam, diagnosis, procedures, and orders on 11/26/2020 are all accurate and complete.  Time spent: 50 minutes-Greater than 50% of this time was spent in counseling, explanation of diagnosis, planning of further management, and coordination of care.   Waynetta Pean  11/26/2020 3:43 PM     [facility]

## 2020-11-26 NOTE — Patient Instructions (Addendum)
Medication Instructions:  TAKE METOPROLOL 100 MG 2 HOURS PRIOR TO CARDIAC CT  TAKE THE FOLLOWING PRIOR TO CARDIAC CT   Prednisone 50 mg - take 13 hours prior to test Take another Prednisone 50 mg 7 hours prior to test Take another Prednisone 50 mg 1 hour prior to test Take Benadryl 50 mg 1 hour prior to test Patient must complete all four doses of above prophylactic medications. Patient will need a ride after test due to Benadryl.  *If you need a refill on your cardiac medications before your next appointment, please call your pharmacy*   Lab Work: FASTING LP/CMET 1 WEEK PRIOR TO [place] CT   If you have labs (blood work) drawn today and your tests are completely normal, you will receive your results only by: [place] (if you have MyChart) OR A paper copy in the mail If you have any lab test that is abnormal or we need to change your treatment, we will call you to review the results.   Testing/Procedures: Your physician has requested that you have cardiac CT. Cardiac computed tomography (CT) is a painless test that uses an x-ray machine to take clear, detailed pictures of your heart. For further information please visit [email or web address]. Please follow instruction sheet as given.  Your physician has requested that you have a lower or upper extremity venous duplex. This test is an ultrasound of the veins in the legs or arms. It looks at venous blood flow that carries blood from the heart to the legs or arms. Allow one hour for a Lower Venous exam. Allow thirty minutes for an Upper Venous exam. There are no restrictions or special instructions. LEFT LEG   Follow-Up: At [facility], you and your health needs are our priority.  As part of our continuing mission to provide you with exceptional heart care, we have created designated Provider Care Teams.  These Care Teams include your primary Cardiologist (physician) and Advanced Practice Providers (APPs -  Physician  Assistants and Nurse Practitioners) who all work together to provide you with the care you need, when you need it.  We recommend signing up for the patient portal called "MyChart".  Sign up information is provided on this After Visit Summary.  MyChart is used to connect with patients for Virtual Visits (Telemedicine).  Patients are able to view lab/test results, encounter notes, upcoming appointments, etc.  Non-urgent messages can be sent to your provider as well.   To learn more about what you can do with MyChart, go to [email or web address].    Your next appointment:   6 month(s)  The format for your next appointment:   In Person  Provider:   Laurann Montana, NP   Other Instructions    Your cardiac CT will be scheduled at one of the below locations:   [facility] [address] [place], [place] [address] [phone]  [place] [address] [place], [place] [address] [phone]  If scheduled at [facility], please arrive at the [facility] main entrance (entrance A) of [facility] 30 minutes prior to test start time. Proceed to the [facility] Radiology Department (first floor) to check-in and test prep.  If scheduled at [facility], please arrive 15 mins early for check-in and test prep.  Please follow these instructions carefully (unless otherwise directed):  Hold all erectile dysfunction medications at least 3 days (72 hrs) prior to test.  On the  Night Before the Test: Be sure to Drink plenty of water. Do not consume any caffeinated/decaffeinated beverages or chocolate 12 hours prior to your test. Do not take any antihistamines 12 hours prior to your test. If the patient has contrast allergy: Patient will need a prescription for Prednisone and very clear instructions (as follows): Prednisone 50 mg - take 13 hours prior to test Take another Prednisone 50  mg 7 hours prior to test Take another Prednisone 50 mg 1 hour prior to test Take Benadryl 50 mg 1 hour prior to test Patient must complete all four doses of above prophylactic medications. Patient will need a ride after test due to Benadryl.  On the Day of the Test: Drink plenty of water until 1 hour prior to the test. Do not eat any food 4 hours prior to the test. You may take your regular medications prior to the test.  Take metoprolol (Lopressor) two hours prior to test. HOLD Furosemide/Hydrochlorothiazide morning of the test. FEMALES- please wear underwire-free bra if available, avoid dresses & tight clothing      After the Test: Drink plenty of water. After receiving IV contrast, you may experience a mild flushed feeling. This is normal. On occasion, you may experience a mild rash up to 24 hours after the test. This is not dangerous. If this occurs, you can take Benadryl 25 mg and increase your fluid intake. If you experience trouble breathing, this can be serious. If it is severe call 911 IMMEDIATELY. If it is mild, please call our office. If you take any of these medications: Glipizide/Metformin, Avandament, Glucavance, please do not take 48 hours after completing test unless otherwise instructed.  Please allow 2-4 weeks for scheduling of routine cardiac CTs. Some insurance companies require a pre-authorization which may delay scheduling of this test.   For non-scheduling related questions, please contact the cardiac imaging nurse navigator should you have any questions/concerns: Marchia Bond, Cardiac Imaging Nurse Navigator Gordy Clement, Cardiac Imaging Nurse Navigator [facility] Direct Office Dial: [phone]   For scheduling needs, including cancellations and rescheduling, please call [place], [phone].  Cardiac CT Angiogram A cardiac CT angiogram is a procedure to look at the heart and the area around the heart. It may be done to help  find the cause of chest pains or other symptoms of heart disease. During this procedure, a substance called contrast dye is injected into the blood vessels in the area to be checked. A large X-ray machine, called a CT scanner, then takes detailed pictures of the heart and the surrounding area. The procedure is also sometimes called a coronary CT angiogram, coronary artery scanning, or CTA. A cardiac CT angiogram allows the health care provider to see how well blood is flowing to and from the heart. The health care provider will be able to see if there are any problems, such as: Blockage or narrowing of the coronary arteries in the heart. Fluid around the heart. Signs of weakness or disease in the muscles, valves, and tissues of the heart. Tell a health care provider about: Any allergies you have. This is especially important if you have had a previous allergic reaction to contrast dye. All medicines you are taking, including vitamins, herbs, eye drops, creams, and over-the-counter medicines. Any blood disorders you have. Any surgeries you have had. Any medical conditions you have. Whether you are pregnant or may be pregnant. Any anxiety disorders, chronic pain, or other conditions you have that may increase your stress  or prevent you from lying still. What are the risks? Generally, this is a safe procedure. However, problems may occur, including: Bleeding. Infection. Allergic reactions to medicines or dyes. Damage to other structures or organs. Kidney damage from the contrast dye that is used. Increased risk of cancer from radiation exposure. This risk is low. Talk with your health care provider about: The risks and benefits of testing. How you can receive the lowest dose of radiation. What happens before the procedure? Wear comfortable clothing and remove any jewelry, glasses, dentures, and hearing aids. Follow instructions from your health care provider about eating and drinking. This may  include: For 12 hours before the procedure -- avoid caffeine. This includes tea, coffee, soda, energy drinks, and diet pills. Drink plenty of water or other fluids that do not have caffeine in them. Being well hydrated can prevent complications. For 4-6 hours before the procedure -- stop eating and drinking. The contrast dye can cause nausea, but this is less likely if your stomach is empty. Ask your health care provider about changing or stopping your regular medicines. This is especially important if you are taking diabetes medicines, blood thinners, or medicines to treat problems with erections (erectile dysfunction). What happens during the procedure?  Hair on your chest may need to be removed so that small sticky patches called electrodes can be placed on your chest. These will transmit information that helps to monitor your heart during the procedure. An IV will be inserted into one of your veins. You might be given a medicine to control your heart rate during the procedure. This will help to ensure that good images are obtained. You will be asked to lie on an exam table. This table will slide in and out of the CT machine during the procedure. Contrast dye will be injected into the IV. You might feel warm, or you may get a metallic taste in your mouth. You will be given a medicine called nitroglycerin. This will relax or dilate the arteries in your heart. The table that you are lying on will move into the CT machine tunnel for the scan. The person running the machine will give you instructions while the scans are being done. You may be asked to: Keep your arms above your head. Hold your breath. Stay very still, even if the table is moving. When the scanning is complete, you will be moved out of the machine. The IV will be removed. The procedure may vary among health care providers and hospitals. What can I expect after the procedure? After your procedure, it is common to have: A metallic  taste in your mouth from the contrast dye. A feeling of warmth. A headache from the nitroglycerin. Follow these instructions at home: Take over-the-counter and prescription medicines only as told by your health care provider. If you are told, drink enough fluid to keep your urine pale yellow. This will help to flush the contrast dye out of your body. Most people can return to their normal activities right after the procedure. Ask your health care provider what activities are safe for you. It is up to you to get the results of your procedure. Ask your health care provider, or the department that is doing the procedure, when your results will be ready. Keep all follow-up visits as told by your health care provider. This is important. Contact a health care provider if: You have any symptoms of allergy to the contrast dye. These include: Shortness of breath. Rash or hives.  A racing heartbeat. Summary A cardiac CT angiogram is a procedure to look at the heart and the area around the heart. It may be done to help find the cause of chest pains or other symptoms of heart disease. During this procedure, a large X-ray machine, called a CT scanner, takes detailed pictures of the heart and the surrounding area after a contrast dye has been injected into blood vessels in the area. Ask your health care provider about changing or stopping your regular medicines before the procedure. This is especially important if you are taking diabetes medicines, blood thinners, or medicines to treat erectile dysfunction. If you are told, drink enough fluid to keep your urine pale yellow. This will help to flush the contrast dye out of your body. This information is not intended to replace advice given to you by your health care provider. Make sure you discuss any questions you have with your health care provider. Document Revised: 10/24/2018 Document Reviewed: 10/24/2018 Elsevier Patient Education  [place].

## 2020-11-26 NOTE — Assessment & Plan Note (Addendum)
Lipids are poorly controlled.  She didn't tolerate atorvastatin due to cramps.  She wasn't taking simvastaitn when they were last checked.  We will have her come back for fasting lipids/CMP.  Continue simvastatin for now.

## 2020-11-26 NOTE — Assessment & Plan Note (Signed)
Occurred in the setting of a fall in 2010.  She is on clopidogrel and tolerates it well.  BP well-controlled but lipids are poorly controlled on simvastatin.

## 2020-11-26 NOTE — Assessment & Plan Note (Signed)
BP well-controlled on amlodipine and HCTZ.  Continue current regimen.

## 2020-11-26 NOTE — Assessment & Plan Note (Signed)
Ms. Alexandria [name] has a history of venous insufficiency.  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  We will get LE Dopplers to assess.  Continue conservative management with compression and elevation as well as vein and vascular follow up.

## 2020-11-26 NOTE — Assessment & Plan Note (Signed)
Her chest pain is very atypical.  However, lipids are poorly controlled and she has prior tobacco abuse.  We will get a coronary CT-A.

## 2020-12-01 ENCOUNTER — Telehealth (HOSPITAL_COMMUNITY): Payer: Self-pay | Admitting: *Deleted

## 2020-12-01 ENCOUNTER — Ambulatory Visit (INDEPENDENT_AMBULATORY_CARE_PROVIDER_SITE_OTHER): Payer: Medicare Other

## 2020-12-01 ENCOUNTER — Other Ambulatory Visit: Payer: Self-pay

## 2020-12-01 DIAGNOSIS — M79605 Pain in left leg: Secondary | ICD-10-CM

## 2020-12-01 DIAGNOSIS — M7989 Other specified soft tissue disorders: Secondary | ICD-10-CM

## 2020-12-01 LAB — LIPID PANEL
Chol/HDL Ratio: 3.3 ratio (ref 0.0–4.4)
Cholesterol, Total: 225 mg/dL — ABNORMAL HIGH (ref 100–199)
HDL: 69 mg/dL (ref >39)
LDL Chol Calc (NIH): 135 mg/dL — ABNORMAL HIGH (ref 0–99)
Triglycerides: 118 mg/dL (ref 0–149)
VLDL Cholesterol Cal: 21 mg/dL (ref 5–40)

## 2020-12-01 LAB — COMPREHENSIVE METABOLIC PANEL
ALT: 10 IU/L (ref 0–32)
AST: 17 IU/L (ref 0–40)
Albumin/Globulin Ratio: 2.1 (ref 1.2–2.2)
Albumin: 4.7 g/dL (ref 3.7–4.7)
Alkaline Phosphatase: 65 IU/L (ref 44–121)
BUN/Creatinine Ratio: 15 (ref 12–28)
BUN: 14 mg/dL (ref 8–27)
Bilirubin Total: 0.9 mg/dL (ref 0.0–1.2)
CO2: 24 mmol/L (ref 20–29)
Calcium: 9.7 mg/dL (ref 8.7–10.3)
Chloride: 101 mmol/L (ref 96–106)
Creatinine, Ser: 0.96 mg/dL (ref 0.57–1.00)
Globulin, Total: 2.2 g/dL (ref 1.5–4.5)
Glucose: 100 mg/dL — ABNORMAL HIGH (ref 65–99)
Potassium: 4.3 mmol/L (ref 3.5–5.2)
Sodium: 140 mmol/L (ref 134–144)
Total Protein: 6.9 g/dL (ref 6.0–8.5)
eGFR: 61 mL/min/{1.73_m2} (ref >59)

## 2020-12-01 NOTE — Telephone Encounter (Signed)
Attempted to call patient regarding upcoming cardiac CT appointment. °Left message on voicemail with name and callback number ° °Laryn Venning RN Navigator Cardiac Imaging °[facility] °[phone] Office °[phone] Cell ° °

## 2020-12-03 ENCOUNTER — Ambulatory Visit (HOSPITAL_COMMUNITY)
Admission: RE | Admit: 2020-12-03 | Discharge: 2020-12-03 | Disposition: A | Discharge status: Home / Self Care | Payer: Medicare Other | Source: Ambulatory Visit | Attending: Cardiovascular Disease | Admitting: Cardiovascular Disease

## 2020-12-03 ENCOUNTER — Other Ambulatory Visit: Payer: Self-pay | Admitting: Cardiology

## 2020-12-03 ENCOUNTER — Other Ambulatory Visit: Payer: Self-pay

## 2020-12-03 ENCOUNTER — Ambulatory Visit (HOSPITAL_COMMUNITY)
Admission: RE | Admit: 2020-12-03 | Discharge: 2020-12-03 | Disposition: A | Discharge status: Home / Self Care | Payer: Medicare Other | Source: Ambulatory Visit | Attending: Cardiology | Admitting: Cardiology

## 2020-12-03 DIAGNOSIS — R079 Chest pain, unspecified: Secondary | ICD-10-CM

## 2020-12-03 DIAGNOSIS — E785 Hyperlipidemia, unspecified: Secondary | ICD-10-CM

## 2020-12-03 DIAGNOSIS — I700 Atherosclerosis of aorta: Secondary | ICD-10-CM

## 2020-12-03 DIAGNOSIS — R072 Precordial pain: Secondary | ICD-10-CM | POA: Diagnosis present

## 2020-12-03 DIAGNOSIS — I639 Cerebral infarction, unspecified: Secondary | ICD-10-CM | POA: Diagnosis present

## 2020-12-03 DIAGNOSIS — I10 Essential (primary) hypertension: Secondary | ICD-10-CM | POA: Insufficient documentation

## 2020-12-03 DIAGNOSIS — R931 Abnormal findings on diagnostic imaging of heart and coronary circulation: Secondary | ICD-10-CM

## 2020-12-03 MED ORDER — NITROGLYCERIN 0.4 MG SL SUBL
SUBLINGUAL_TABLET | SUBLINGUAL | Status: AC
  Filled 2020-12-03: qty 2

## 2020-12-03 MED ORDER — NITROGLYCERIN 0.4 MG SL SUBL
0.8000 mg | SUBLINGUAL_TABLET | Freq: Once | SUBLINGUAL | Status: AC
  Administered 2020-12-03: 0.8 mg via SUBLINGUAL

## 2020-12-03 MED ORDER — IOHEXOL 350 MG/ML SOLN
95.0000 mL | Freq: Once | INTRAVENOUS | Status: AC | PRN
  Administered 2020-12-03: 95 mL via INTRAVENOUS

## 2020-12-03 NOTE — Progress Notes (Signed)
ffr

## 2020-12-04 DIAGNOSIS — R931 Abnormal findings on diagnostic imaging of heart and coronary circulation: Secondary | ICD-10-CM

## 2020-12-04 DIAGNOSIS — I700 Atherosclerosis of aorta: Secondary | ICD-10-CM | POA: Diagnosis not present

## 2020-12-05 ENCOUNTER — Other Ambulatory Visit: Payer: Self-pay | Admitting: Adult Health

## 2020-12-05 DIAGNOSIS — F32A Depression, unspecified: Secondary | ICD-10-CM

## 2020-12-05 DIAGNOSIS — F419 Anxiety disorder, unspecified: Secondary | ICD-10-CM

## 2020-12-09 ENCOUNTER — Other Ambulatory Visit: Payer: Self-pay | Admitting: Pulmonary Disease

## 2020-12-11 ENCOUNTER — Telehealth: Payer: Self-pay

## 2020-12-11 NOTE — Telephone Encounter (Signed)
Called Patient for her [facility] telephone visit this afternoon scheduled by Virgilio Frees.  Patent was angry and upset because she states she was told the [facility] would be traveling to her home for this visit in person.  She states she called the office this morning to verify this appointment and that she was home all day today waiting for this in person visit.    NHA explained we do not do home visits, our visits are via telephone , video or in person.  Patient became more upset and said she was mislead.   NHA tried to explain that maybe her supplement [employer] may have scheduled a home visit. She then stated no , Tommi Rumps always send the nurse to my house from your office.   She then proceed to hang up the telephone.  L.Priscille Shadduck,LPN

## 2020-12-11 NOTE — Progress Notes (Deleted)
Subjective:   Alexandria [name] is a 80 y.o. female who presents for Medicare Annual (Subsequent) preventive examination.  Review of Systems    N/A       Objective:    There were no vitals filed for this visit. There is no height or weight on file to calculate BMI.  Advanced Directives 03/04/2020 06/11/2019 06/09/2017 04/12/2016 04/12/2016 12/08/2015 12/08/2015  Does Patient Have a Medical Advance Directive? Yes Yes Yes No Yes No No  Type of Paramedic of [facility];Living will [place];Living will [place];Living will - Healthcare Power of [place];Living will Healthcare Power of Attorney;Mental Health Advance Directive  Does patient want to make changes to medical advance directive? No - Patient declined - - - - Yes - information given No - Patient declined  Copy of [place] in Chart? - - Yes - No - copy requested No - copy requested No - copy requested  Would patient like information on creating a medical advance directive? - No - Patient declined - - - - -    Current Medications (verified) Outpatient Encounter Medications as of 12/11/2020  Medication Sig   albuterol (PROAIR HFA) 108 (90 Base) MCG/ACT inhaler Inhale 2 puffs into the lungs every 6 (six) hours as needed for wheezing or shortness of breath.   amLODipine (NORVASC) 10 MG tablet TAKE 1 TABLET EACH DAY.   ARNICA EX Apply 1 application topically daily as needed (pain).   b complex vitamins tablet Take 1 tablet by mouth 2 (two) times daily. PLUS FOLIC ACID PLUS VIT. C   Biotin 5000 MCG CAPS Take 5,000 mcg by mouth daily.    Capsaicin 0.025 % PADS Apply 1 application topically daily as needed (pain).   Cholecalciferol (VITAMIN D-3) 5000 UNITS TABS Take 5,000 Units by mouth daily.    clopidogrel (PLAVIX) 75 MG tablet TAKE 1 TABLET BY MOUTH DAILY.   CO-ENZYME Q10-VITAMIN E PO Take 1 tablet by mouth daily.   Diclofenac  Sodium (PENNSAID) 2 % SOLN Apply 1 application topically 3 (three) times daily.   diphenhydrAMINE (BENADRYL) 25 MG tablet Take 25 mg by mouth every 6 (six) hours as needed. Patient taking 1/2 (12.5 mg) as needed for allergies and itching   escitalopram (LEXAPRO) 10 MG tablet TAKE ONE TABLET BY MOUTH ONCE DAILY   fluticasone (FLONASE) 50 MCG/ACT nasal spray USE 1 SPRAY IN EACH NOSTRIL DAILY.   fluticasone-salmeterol (ADVAIR HFA) 230-21 MCG/ACT inhaler Inhale 2 puffs into the lungs 2 (two) times daily.   hydrochlorothiazide (MICROZIDE) 12.5 MG capsule TAKE (1) CAPSULE TWICE DAILY.   ipratropium (ATROVENT) 0.03 % nasal spray Place 2 sprays into both nostrils every 12 (twelve) hours.   loratadine (CLARITIN) 10 MG tablet Take 10 mg by mouth daily as needed for allergies.   metoprolol tartrate (LOPRESSOR) 100 MG tablet TAKE 1 TABLET 2 HOURS PRIOR TO CT   Misc Natural Products (OSTEO BI-FLEX ADV TRIPLE ST PO) Take 1 tablet by mouth daily.   montelukast (SINGULAIR) 10 MG tablet Take 1 tablet (10 mg total) by mouth at bedtime.   ondansetron (ZOFRAN ODT) 4 MG disintegrating tablet Take 1 tablet (4 mg total) by mouth every 8 (eight) hours as needed for nausea or vomiting.   pantoprazole (PROTONIX) 40 MG tablet TAKE 1 TABLET BY MOUTH DAILY.   Polyethyl Glycol-Propyl Glycol (SYSTANE OP) Place 1 drop into both eyes daily as needed. Dry eyes   potassium chloride SA (  KLOR-CON) 20 MEQ tablet TAKE 3 TABLETS DAILY.   predniSONE (DELTASONE) 50 MG tablet Take 1 tablet 13 hours prior to procedure, 1 tablet 7 hours prior to procedure, and 1 tablet (with Benedryl 50 mg) prior to going to the hospital for procedure   Probiotic Product (ALIGN PO) Take 1 tablet by mouth daily.   Selenium 200 MCG CAPS Take 200 mcg by mouth 2 (two) times daily.    simvastatin (ZOCOR) 10 MG tablet TAKE (1) TABLET TWO TIMES A WEEK.   Trolamine Salicylate (ASPERCREME EX) Apply 1 application topically daily as needed (pain).   No  facility-administered encounter medications on file as of 12/11/2020.    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 since age 50   IBS (irritable bowel syndrome)    Long term current use of aromatase inhibitor 05/08/2012   Overactive bladder    Palpitations    Pneumonia    hx of x 2    PONV (postoperative nausea and vomiting)    PVD (peripheral vascular disease) (HCC)    varicose veins - left worse than right   Spinal stenosis    Stress incontinence    Stroke ([place])    problem with expressive communicaiton   Traumatic hemopneumothorax 1982   left   Past Surgical History:  Procedure Laterality Date   ANKLE HARDWARE REMOVAL     right   BREAST BIOPSY  1976   right   BREAST BIOPSY Left 2019   benign lymph node   BREAST LUMPECTOMY  2013   right with snbx   CHOLECYSTECTOMY  1990s   COLONOSCOPY     CYSTOSCOPY  07/05/2005   KNEE SURGERY  1980s   left   KYPHOPLASTY N/A  06/15/2017   Procedure: KYPHOPLASTY T11;  Surgeon: Melina Schools, MD;  Location: [facility];  Service: Orthopedics;  Laterality: N/A;  90 mins   ORIF ANKLE FRACTURE  1980s   right    TOTAL KNEE ARTHROPLASTY Left 12/08/2015   Procedure: LEFT TOTAL KNEE ARTHROPLASTY;  Surgeon: Paralee Cancel, MD;  Location: [facility];  Service: Orthopedics;  Laterality: Left;   TRANSOBTURATOR SLING  07/05/2005   TUBAL LIGATION  1976   VEIN SURGERY     vein ablation left leg   Family History  Problem Relation Age of Onset   Stroke Mother  Heart attack Father        Died age 98   Cancer Maternal Aunt        breast   Cancer Paternal Aunt        Multiple Myeloma   Breast cancer Paternal Aunt        late 50s-60s   Breast cancer Paternal Aunt        bilateral breast cancer   Dementia Paternal Aunt    Stomach cancer Paternal Uncle    Breast cancer Paternal Uncle    Congestive Heart Failure Paternal Uncle        Ischemic   Congestive Heart Failure Paternal Uncle        Ischemic   Heart attack Paternal Grandfather    Breast cancer Cousin        early 40s   Breast cancer Daughter    Social History   Socioeconomic History   Marital status: Widowed    Spouse name: Not on file   Number of children: Not on file   Years of education: Not on file   Highest education level: Not on file  Occupational History   Not on file  Tobacco Use   Smoking status: Former    Packs/day: 0.50    Years: 15.00    Pack years: 7.50    Types: Cigarettes    Quit date: 08/28/1993    Years since quitting: 27.3   Smokeless tobacco: Never  Vaping Use   Vaping Use: Never used  Substance and Sexual Activity   Alcohol use: No    Alcohol/week: 0.0 standard drinks   Drug use: No   Sexual activity: Not Currently  Other Topics Concern   Not on file  Social History Narrative   Widowed since 2014   Retired Scientist, product/process development    Son and daughter   Social Determinants of Health   Financial Resource Strain: Low Risk    Difficulty of  Paying Living Expenses: Not hard at all  Food Insecurity: No Food Insecurity   Worried About Charity fundraiser in the Last Year: Never true   Arboriculturist in the Last Year: Never true  Transportation Needs: No Transportation Needs   Lack of Transportation (Medical): No   Lack of Transportation (Non-Medical): No  Physical Activity: Insufficiently Active   Days of Exercise per Week: 3 days   Minutes of Exercise per Session: 30 min  Stress: Not on file  Social Connections: Not on file    Tobacco Counseling Counseling given: Not Answered   Clinical Intake:                 Diabetic?***         Activities of Daily Living No flowsheet data found.  Patient Care Team: [name], NP as PCP - General (Family Medicine) Juanita Craver, MD as Consulting Physician (Gastroenterology) Damian Leavell, MD as Referring Physician (Obstetrics and Gynecology)  Indicate any recent Medical Services you may have received from other than [facility] providers in the past year (date may be approximate).     Assessment:   This is a routine wellness examination for Alexandria [name].  Hearing/Vision screen No results found.  Dietary issues and exercise activities discussed:     Goals Addressed   None    Depression Screen PHQ 2/9 Scores 07/11/2019 11/20/2013  PHQ - 2 Score 0 0    Fall Risk Fall Risk  07/11/2019 02/14/2014 11/20/2013  Falls in the past year? 1 Yes Yes  Number  falls in past yr: 0 2 or more 2 or more  Injury with Fall? 0 - -  Follow up Falls evaluation completed;Education provided;Falls prevention discussed - -    FALL RISK PREVENTION PERTAINING TO THE HOME:  Any stairs in or around the home? {YES/NO:21197} If so, are there any without handrails? {YES/NO:21197} Home free of loose throw rugs in walkways, pet beds, electrical cords, etc? {YES/NO:21197} Adequate lighting in your home to reduce risk of falls? {YES/NO:21197}  ASSISTIVE DEVICES UTILIZED TO PREVENT  FALLS:  Life alert? {YES/NO:21197} Use of a cane, walker or w/c? {YES/NO:21197} Grab bars in the bathroom? {YES/NO:21197} Shower chair or bench in shower? {YES/NO:21197} Elevated toilet seat or a handicapped toilet? {YES/NO:21197}  TIMED UP AND GO:  Was the test performed? {YES/NO:21197}.  Length of time to ambulate 10 feet: *** sec.   {Appearance of ERQS:1282081}  Cognitive Function:        Immunizations Immunization History  Administered Date(s) Administered   Fluad Quad(high Dose 65+) 12/11/2018   H1N1 02/26/2008   Influenza Split 12/15/2010, 12/30/2011   Influenza Whole 12/22/2006, 12/25/2007, 12/09/2008, 12/02/2009   Influenza, High Dose Seasonal PF 12/14/2012, 01/22/2016, 11/29/2016, 11/14/2017   Influenza,inj,Quad PF,6+ Mos 11/20/2013, 12/08/2014   PFIZER(Purple Top)SARS-COV-2 Vaccination 04/19/2019, 05/14/2019   Pneumococcal Conjugate-13 11/20/2013   Pneumococcal Polysaccharide-23 03/14/2001, 02/12/2007, 11/29/2016   Td 03/14/2001   Tdap 08/30/2011   Zoster, Live 03/11/2008    {TDAP status:2101805}  {Flu Vaccine status:2101806}  {Pneumococcal vaccine status:2101807}  {Covid-19 vaccine status:2101808}  Qualifies for Shingles Vaccine? {YES/NO:21197}  Zostavax completed {YES/NO:21197}  {Shingrix Completed?:2101804}  Screening Tests Health Maintenance  Topic Date Due   Hepatitis C Screening  Never done   Zoster Vaccines- Shingrix (1 of 2) Never done   COVID-19 Vaccine (3 - Pfizer risk series) 06/11/2019   INFLUENZA VACCINE  10/12/2020   TETANUS/TDAP  08/29/2021   DEXA SCAN  Completed   HPV VACCINES  Aged Out    Health Maintenance  Health Maintenance Due  Topic Date Due   Hepatitis C Screening  Never done   Zoster Vaccines- Shingrix (1 of 2) Never done   COVID-19 Vaccine (3 - Pfizer risk series) 06/11/2019   INFLUENZA VACCINE  10/12/2020    {Colorectal cancer screening:2101809}  {Mammogram status:21018020}  {Bone Density  status:21018021}  Lung Cancer Screening: (Low Dose CT Chest recommended if Age 12-80 years, 30 pack-year currently smoking OR have quit w/in 15years.) {DOES NOT does:27190::"does not"} qualify.   Lung Cancer Screening Referral: ***  Additional Screening:  Hepatitis C Screening: {DOES NOT does:27190::"does not"} qualify; Completed ***  Vision Screening: Recommended annual ophthalmology exams for early detection of glaucoma and other disorders of the eye. Is the patient up to date with their annual eye exam?  {YES/NO:21197} Who is the provider or what is the name of the office in which the patient attends annual eye exams? *** If pt is not established with a provider, would they like to be referred to a provider to establish care? {YES/NO:21197}.   Dental Screening: Recommended annual dental exams for proper oral hygiene  Community Resource Referral / Chronic Care Management: CRR required this visit?  {YES/NO:21197}  CCM required this visit?  {YES/NO:21197}     Plan:     I have personally reviewed and noted the following in the patient's chart:   Medical and social history Use of alcohol, tobacco or illicit drugs  Current medications and supplements including opioid prescriptions.  Functional ability and status Nutritional status Physical activity Advanced directives List  of other physicians Hospitalizations, surgeries, and ER visits in previous 12 months Vitals Screenings to include cognitive, depression, and falls Referrals and appointments  In addition, I have reviewed and discussed with patient certain preventive protocols, quality metrics, and best practice recommendations. A written personalized care plan for preventive services as well as general preventive health recommendations were provided to patient.     Randel Pigg, LPN   [id number]/72/[id number]   Nurse Notes: ***

## 2020-12-14 ENCOUNTER — Other Ambulatory Visit: Payer: Self-pay | Admitting: Adult Health

## 2020-12-14 DIAGNOSIS — Z1231 Encounter for screening mammogram for malignant neoplasm of breast: Secondary | ICD-10-CM

## 2020-12-28 ENCOUNTER — Telehealth (HOSPITAL_BASED_OUTPATIENT_CLINIC_OR_DEPARTMENT_OTHER): Payer: Self-pay | Admitting: *Deleted

## 2020-12-28 DIAGNOSIS — E785 Hyperlipidemia, unspecified: Secondary | ICD-10-CM

## 2020-12-28 DIAGNOSIS — Z5181 Encounter for therapeutic drug level monitoring: Secondary | ICD-10-CM

## 2020-12-28 DIAGNOSIS — I10 Essential (primary) hypertension: Secondary | ICD-10-CM

## 2020-12-28 MED ORDER — SIMVASTATIN 10 MG PO TABS: 10.0000 mg | ORAL_TABLET | Freq: Every day | ORAL | 3 refills | Status: DC

## 2020-12-28 NOTE — Telephone Encounter (Signed)
-----   Message from Skeet Latch, MD sent at 12/28/2020  5:54 AM EDT ----- Given that we do not know she has some coronary artery disease, her lipids are too high.  Recommend that she start taking the simvastatin every day and repeat labs in 2 to 3 months.  Her LDL needs to be less than 70.  If she does not tolerate that we will try different statin on consider a PCSK9 inhibitor.

## 2020-12-28 NOTE — Telephone Encounter (Signed)
-----   Message from Skeet Latch, MD sent at 12/28/2020  5:55 AM EDT ----- CT scan shows that she has mild-moderate blockage in 2 arteries.  We need to be very aggressive about prevention to avoid this getting worse.

## 2020-12-28 NOTE — Telephone Encounter (Signed)
-----   Message from Skeet Latch, MD sent at 12/28/2020  5:56 AM EDT ----- No DVTs.

## 2020-12-28 NOTE — Telephone Encounter (Signed)
Patient returned call

## 2020-12-28 NOTE — Telephone Encounter (Signed)
Left message to call back to discuss CT, labs, and duplex

## 2020-12-28 NOTE — Telephone Encounter (Signed)
Advised patient of all results, verbalized understanding  Mailed lab orders and Rx sent to pharmacy

## 2021-02-16 ENCOUNTER — Telehealth: Payer: Self-pay | Admitting: Adult Health

## 2021-02-16 ENCOUNTER — Encounter: Payer: Self-pay | Admitting: Adult Health

## 2021-02-16 ENCOUNTER — Ambulatory Visit (INDEPENDENT_AMBULATORY_CARE_PROVIDER_SITE_OTHER): Payer: Medicare Other | Admitting: Adult Health

## 2021-02-16 VITALS — BP 130/62 | HR 84 | Temp 97.2°F | Ht 63.5 in | Wt 142.0 lb

## 2021-02-16 DIAGNOSIS — Z0000 Encounter for general adult medical examination without abnormal findings: Secondary | ICD-10-CM | POA: Diagnosis not present

## 2021-02-16 DIAGNOSIS — E785 Hyperlipidemia, unspecified: Secondary | ICD-10-CM | POA: Diagnosis not present

## 2021-02-16 DIAGNOSIS — I10 Essential (primary) hypertension: Secondary | ICD-10-CM

## 2021-02-16 DIAGNOSIS — I639 Cerebral infarction, unspecified: Secondary | ICD-10-CM | POA: Diagnosis not present

## 2021-02-16 DIAGNOSIS — F419 Anxiety disorder, unspecified: Secondary | ICD-10-CM

## 2021-02-16 DIAGNOSIS — M8580 Other specified disorders of bone density and structure, unspecified site: Secondary | ICD-10-CM

## 2021-02-16 DIAGNOSIS — J449 Chronic obstructive pulmonary disease, unspecified: Secondary | ICD-10-CM

## 2021-02-16 DIAGNOSIS — R2681 Unsteadiness on feet: Secondary | ICD-10-CM

## 2021-02-16 DIAGNOSIS — F32A Depression, unspecified: Secondary | ICD-10-CM

## 2021-02-16 DIAGNOSIS — H9193 Unspecified hearing loss, bilateral: Secondary | ICD-10-CM

## 2021-02-16 LAB — CBC WITH DIFFERENTIAL/PLATELET
Basophils Absolute: 0.1 10*3/uL (ref 0.0–0.1)
Basophils Relative: 0.8 % (ref 0.0–3.0)
Eosinophils Absolute: 0.1 10*3/uL (ref 0.0–0.7)
Eosinophils Relative: 0.9 % (ref 0.0–5.0)
HCT: 39.4 % (ref 36.0–46.0)
Hemoglobin: 12.9 g/dL (ref 12.0–15.0)
Lymphocytes Relative: 29.4 % (ref 12.0–46.0)
Lymphs Abs: 2.3 10*3/uL (ref 0.7–4.0)
MCHC: 32.8 g/dL (ref 30.0–36.0)
MCV: 87.4 fl (ref 78.0–100.0)
Monocytes Absolute: 0.6 10*3/uL (ref 0.1–1.0)
Monocytes Relative: 7.1 % (ref 3.0–12.0)
Neutro Abs: 4.8 10*3/uL (ref 1.4–7.7)
Neutrophils Relative %: 61.8 % (ref 43.0–77.0)
Platelets: 241 10*3/uL (ref 150.0–400.0)
RBC: 4.5 Mil/uL (ref 3.87–5.11)
RDW: 13.9 % (ref 11.5–15.5)
WBC: 7.7 10*3/uL (ref 4.0–10.5)

## 2021-02-16 LAB — TSH: TSH: 2.24 u[IU]/mL (ref 0.35–5.50)

## 2021-02-16 NOTE — Progress Notes (Addendum)
Subjective:    Patient ID: Alexandria [name], female    DOB: 06/14/1940, 80 y.o.   MRN: [id number]  HPI  Patient presents for yearly preventative medicine examination. 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  Essential hypertension-takes Norvasc 10 mg daily and HCTZ 12.5 mg twice daily.  She denies dizziness, lightheadedness, chest pain.  BP Readings from Last 3 Encounters:  02/16/21 130/62  12/03/20 (!) 108/58  11/26/20 126/76   Hyperlipidemia-has not tolerated atorvastatin in the past due to cramping.  Currently on simvastatin 10 mg daily. Lab Results  Component Value Date   CHOL 225 (H) 12/01/2020   HDL 69 12/01/2020   LDLCALC 135 (H) 12/01/2020   LDLDIRECT 183.5 12/17/2008   TRIG 118 12/01/2020   CHOLHDL 3.3 12/01/2020   Anxiety/depression-takes Lexapro 10 mg daily and feels well controlled on this medication  H/o CVA -occurred in the setting of a fall back in 2010.  She is on Plavix 75 mg daily and tolerates this medication well.  COPD -managed by pulmonary.  Currently prescribed Advair 2 puffs twice daily, Singulair 10 mg daily, and albuterol inhaler as needed.  Hearing Loss and gait instability.   -recently saw her audiologist and had a new set of hearing aids.  During her audiology exam she reports that the hearing in her right ear decreased from "89% to 36%".  The audiologist was concerned and the abrupt decrease in her hearing loss in her right ear and advised that she be seen by a neurologist due to history of multiple head traumas from falling.  She reports having 2 falls this year, both were mechanical in nature where she got tripped up while walking.  She has had progressive gait instability since January 2013.  All immunizations and health maintenance protocols were reviewed with the patient and needed orders were placed.  Appropriate screening laboratory values were ordered for the patient including screening of hyperlipidemia, renal function and hepatic function.  Medication reconciliation,  past medical history, social history, problem list and allergies were reviewed in detail with the patient  Goals were established with regard to weight loss, exercise, and  diet in compliance with medications  Wt Readings from Last 3 Encounters:  02/16/21 142 lb (64.4 kg)  11/26/20 150 lb 14.4 oz (68.4 kg)  07/09/20 145 lb (65.8 kg)   Review of Systems See HPI   Past Medical History:  Diagnosis Date   Allergy    Anemia    hx of    Anxiety    Arthritis    left knee, hands, back   Asthma    daily and prn inhalers   Atypical chest pain 11/26/2020   Brainstem infarct, acute ([place]) 03/2011   slight expressive aphasia, occ. problems with balance   Breast cancer ([place]) 12/2011   right, ER+, PR -, Her 2 -   COPD (chronic obstructive pulmonary disease) ([facility])    Degenerative joint  disease of low back    Depression    Dysrhythmia    atrial fib    GERD (gastroesophageal reflux disease)    Hearing loss    bilateral hearing aids   History of glomerulonephritis as a child   and had abscess left kidney   History of kidney stones    Hx of radiation therapy 03/12/12 -04/13/12   right  breast   Hyperlipidemia    Hypertension    under control, has been on med. since age 89   IBS (irritable bowel syndrome)    Long term current use of aromatase inhibitor 05/08/2012   Overactive bladder    Palpitations    Pneumonia    hx of x 2    PONV (postoperative nausea and vomiting)    PVD (peripheral vascular disease) ([place])    varicose veins - left worse than right   Spinal stenosis    Stress incontinence    Stroke [facility])    problem with expressive communicaiton   Traumatic hemopneumothorax 1982   left    Social History   Socioeconomic History   Marital status: Widowed    Spouse name: Not on file   Number of children: Not on file   Years of education: Not on file   Highest education level: Not on file  Occupational History   Not on file  Tobacco Use   Smoking status: Former    Packs/day: 0.50    Years: 15.00    Pack years: 7.50    Types: Cigarettes    Quit date: 08/28/1993    Years since quitting: 27.4   Smokeless tobacco: Never  Vaping Use   Vaping Use: Never used  Substance and Sexual Activity   Alcohol use: No    Alcohol/week: 0.0 standard drinks   Drug use: No   Sexual activity: Not Currently  Other Topics Concern   Not on file  Social History Narrative   Widowed since 2014   Retired Scientist, product/process development    Son and daughter   Social Determinants of Health   Financial Resource Strain: Low Risk    Difficulty of Paying Living Expenses: Not hard at all  Food Insecurity: No Food Insecurity   Worried About Charity fundraiser in the Last Year: Never true   Arboriculturist in the Last Year: Never true  Transportation Needs: No Transportation Needs   Lack of Transportation (Medical): No   Lack of Transportation (Non-Medical): No  Physical Activity: Insufficiently Active   Days of Exercise per Week: 3 days   Minutes of Exercise per Session: 30 min  Stress: Not on file  Social Connections: Not on file  Intimate Partner Violence: Not on file    Past Surgical  History:  Procedure Laterality Date   ANKLE HARDWARE REMOVAL     right   BREAST BIOPSY  1976   right   BREAST BIOPSY Left 2019   benign lymph node   BREAST LUMPECTOMY  2013   right with snbx   [place]  07/05/2005   KNEE SURGERY  1980s   left   KYPHOPLASTY N/A 06/15/2017   Procedure: KYPHOPLASTY T11;  Surgeon: Melina Schools, MD;  Location: [facility];  Service: Orthopedics;  Laterality: N/A;  90 mins   ORIF ANKLE FRACTURE  1980s   right    TOTAL KNEE ARTHROPLASTY Left 12/08/2015   Procedure: LEFT TOTAL KNEE ARTHROPLASTY;  Surgeon: Paralee Cancel, MD;  Location: [facility];  Service: Orthopedics;  Laterality: Left;   TRANSOBTURATOR SLING  07/05/2005   TUBAL LIGATION  1976   VEIN SURGERY     vein ablation left leg    Family History  Problem Relation Age of Onset   Stroke Mother    Heart attack Father        Died age 3   Cancer Maternal Aunt        breast   Cancer Paternal Aunt        Multiple Myeloma   Breast cancer Paternal Aunt        late 50s-60s   Breast cancer Paternal Aunt        bilateral breast cancer   Dementia Paternal Aunt    Stomach cancer Paternal Uncle    Breast cancer Paternal Uncle    Congestive Heart Failure Paternal Uncle        Ischemic   Congestive Heart Failure Paternal Uncle        Ischemic   Heart attack Paternal Grandfather    Breast cancer Cousin        early 29s   Breast cancer Daughter     Allergies  Allergen Reactions   Augmentin [Amoxicillin-Pot Clavulanate] Itching, Rash and Other (See Comments)    PT DEVELOPED SEVERE RASH INVOLVING MUCUS MEMBRANES or SKIN NECROSIS WITH PENICILLINS: #  #  #  YES  #  #  #     Penicillins Hives and Other (See Comments)    Has patient had a PCN reaction causing immediate rash, facial/tongue/throat swelling, SOB or lightheadedness with hypotension: No HAS PT DEVELOPED SEVERE RASH INVOLVING MUCUS MEMBRANES or SKIN NECROSIS: #  #  #  YES  #  #  #   Has patient had a PCN  reaction that required hospitalization: No Has patient had a PCN reaction occurring within the last 10 years: No    Aspirin Hives   Chlorhexidine Gluconate Rash   Chocolate Hives and Other (See Comments)    RUNNY NOSE    Coffee Bean Extract Hives and Other (See Comments)    RUNNY NOSE    Diclofenac Other (See Comments)   Ibuprofen Hives and Other (See Comments)    RUNNY NOSE   Oxycodone Hcl Hives and Nausea And Vomiting   Shrimp [Shellfish Allergy] Hives and Other (See Comments)    RUNNY NOSE    Sulfonamide Derivatives Hives   Lipitor [Atorvastatin Calcium]     Leg Pain/Joint Pain/Back Pain    Current Outpatient Medications on File Prior to Visit  Medication Sig Dispense Refill   albuterol (PROAIR HFA) 108 (90 Base) MCG/ACT inhaler Inhale 2 puffs into the lungs every 6 (six) hours as needed for wheezing or shortness of breath. 8 g 5   amLODipine (NORVASC) 10 MG tablet TAKE 1 TABLET EACH DAY. 90 tablet 3   ARNICA EX Apply 1 application topically daily as needed (pain).     b complex vitamins tablet Take 1 tablet by mouth 2 (two) times daily. PLUS FOLIC ACID PLUS VIT. C     Biotin 5000 MCG CAPS Take 5,000 mcg by mouth daily.      Capsaicin 0.025 % PADS Apply 1 application topically daily as needed (pain).     Cholecalciferol (VITAMIN D-3) 5000 UNITS TABS Take 5,000 Units by mouth daily.      clopidogrel (PLAVIX) 75 MG tablet TAKE 1 TABLET BY MOUTH DAILY. 90 tablet 3   CO-ENZYME Q10-VITAMIN E PO Take 1 tablet by  mouth daily.     Diclofenac Sodium (PENNSAID) 2 % SOLN Apply 1 application topically 3 (three) times daily. 112 g 0   diphenhydrAMINE (BENADRYL) 25 MG tablet Take 25 mg by mouth every 6 (six) hours as needed. Patient taking 1/2 (12.5 mg) as needed for allergies and itching     escitalopram (LEXAPRO) 10 MG tablet TAKE ONE TABLET BY MOUTH ONCE DAILY 90 tablet 1   fluticasone (FLONASE) 50 MCG/ACT nasal spray USE 1 SPRAY IN EACH NOSTRIL DAILY. 16 g 5   fluticasone-salmeterol  (ADVAIR HFA) 230-21 MCG/ACT inhaler Inhale 2 puffs into the lungs 2 (two) times daily. 1 each 6   hydrochlorothiazide (MICROZIDE) 12.5 MG capsule TAKE (1) CAPSULE TWICE DAILY. 180 capsule 3   ipratropium (ATROVENT) 0.03 % nasal spray Place 2 sprays into both nostrils every 12 (twelve) hours. 30 mL 12   loratadine (CLARITIN) 10 MG tablet Take 10 mg by mouth daily as needed for allergies.     Misc Natural Products (OSTEO BI-FLEX ADV TRIPLE ST PO) Take 1 tablet by mouth daily.     montelukast (SINGULAIR) 10 MG tablet Take 1 tablet (10 mg total) by mouth at bedtime. 30 tablet 11   ondansetron (ZOFRAN ODT) 4 MG disintegrating tablet Take 1 tablet (4 mg total) by mouth every 8 (eight) hours as needed for nausea or vomiting. 20 tablet 1   pantoprazole (PROTONIX) 40 MG tablet TAKE 1 TABLET BY MOUTH DAILY. 90 tablet 3   Polyethyl Glycol-Propyl Glycol (SYSTANE OP) Place 1 drop into both eyes daily as needed. Dry eyes     potassium chloride SA (KLOR-CON) 20 MEQ tablet TAKE 3 TABLETS DAILY. 270 tablet 3   Probiotic Product (ALIGN PO) Take 1 tablet by mouth daily.     Selenium 200 MCG CAPS Take 200 mcg by mouth 2 (two) times daily.      simvastatin (ZOCOR) 10 MG tablet Take 1 tablet (10 mg total) by mouth daily at 6 PM. 90 tablet 3   Trolamine Salicylate (ASPERCREME EX) Apply 1 application topically daily as needed (pain).     No current facility-administered medications on file prior to visit.    BP 130/62   Pulse 84   Temp (!) 97.2 F (36.2 C) (Temporal)   Ht 5' 3.5" (1.613 m)   Wt 142 lb (64.4 kg)   SpO2 96%   BMI 24.76 kg/m       Objective:   Physical Exam Vitals and nursing note reviewed.  Constitutional:      Appearance: Normal appearance.  HENT:     Nose: Nose normal.  Cardiovascular:     Rate and Rhythm: Normal rate and regular rhythm.     Pulses: Normal pulses.     Heart sounds: Normal heart sounds.  Pulmonary:     Effort: Pulmonary effort is normal.     Breath sounds: Normal  breath sounds.  Abdominal:     General: Abdomen is flat. Bowel sounds are normal.     Palpations: Abdomen is soft.  Musculoskeletal:        General: Normal range of motion.  Skin:    General: Skin is warm and dry.  Neurological:     General: No focal deficit present.     Mental Status: She is alert and oriented to person, place, and time.  Psychiatric:        Mood and Affect: Mood normal.        Behavior: Behavior normal.  Thought Content: Thought content normal.        Judgment: Judgment normal.      Assessment & Plan:  1. Routine general medical examination at a health care facility -She will have her CMP and lipid profile done in a few weeks by cardiology.  We will refrain from drawn these today. - CBC with Differential/Platelet; Future - TSH; Future - TSH - CBC with Differential/Platelet  2. Essential hypertension, benign -Controlled no change in medication - CBC with Differential/Platelet; Future - TSH; Future - TSH - CBC with Differential/Platelet  3. Anxiety and depression -Controlled no change in medication  4. Cerebrovascular accident (CVA), unspecified mechanism ([place]) -Continue with Plavix and statin  5. Chronic obstructive pulmonary disease, unspecified COPD type ([place]) -Continue with inhaler -Follow-up with pulmonary as directed  6. Hyperlipidemia, unspecified hyperlipidemia type -Follow-up with cardiology as directed - CBC with Differential/Platelet; Future - TSH; Future - TSH - CBC with Differential/Platelet  7. Bilateral hearing loss, unspecified hearing loss type  - Ambulatory referral to Neurology  8. Gait instability  - Ambulatory referral to Neurology  [name], NP

## 2021-02-16 NOTE — Patient Instructions (Signed)
It was great seeing you today   We will follow up with you regarding your blood work   I will refer you to neurology and they will call and schedule   Let me know if you need anything

## 2021-02-16 NOTE — Telephone Encounter (Signed)
Patient stated that she is due for a bone density appointment. She would like to be scheduled for one.  Patient could be contacted at [phone].  Please advise.

## 2021-02-16 NOTE — Addendum Note (Signed)
Addended by: Apolinar Junes on: 02/16/2021 05:02 PM   Modules accepted: Orders

## 2021-02-17 NOTE — Telephone Encounter (Signed)
Ok to place Dexa scan order?

## 2021-02-18 NOTE — Telephone Encounter (Signed)
Dexa scan order placed

## 2021-02-18 NOTE — Telephone Encounter (Signed)
Patient notified of update  and verbalized understanding. 

## 2021-03-02 ENCOUNTER — Ambulatory Visit
Admission: RE | Admit: 2021-03-02 | Discharge: 2021-03-02 | Disposition: A | Discharge status: Home / Self Care | Payer: Medicare Other | Source: Ambulatory Visit | Attending: Adult Health | Admitting: Adult Health

## 2021-03-02 DIAGNOSIS — Z1231 Encounter for screening mammogram for malignant neoplasm of breast: Secondary | ICD-10-CM

## 2021-03-05 LAB — COMPREHENSIVE METABOLIC PANEL
ALT: 10 IU/L (ref 0–32)
AST: 19 IU/L (ref 0–40)
Albumin/Globulin Ratio: 2.3 — ABNORMAL HIGH (ref 1.2–2.2)
Albumin: 4.5 g/dL (ref 3.7–4.7)
Alkaline Phosphatase: 56 IU/L (ref 44–121)
BUN/Creatinine Ratio: 25 (ref 12–28)
BUN: 20 mg/dL (ref 8–27)
Bilirubin Total: 0.6 mg/dL (ref 0.0–1.2)
CO2: 27 mmol/L (ref 20–29)
Calcium: 9.8 mg/dL (ref 8.7–10.3)
Chloride: 104 mmol/L (ref 96–106)
Creatinine, Ser: 0.79 mg/dL (ref 0.57–1.00)
Globulin, Total: 2 g/dL (ref 1.5–4.5)
Glucose: 96 mg/dL (ref 70–99)
Potassium: 3.9 mmol/L (ref 3.5–5.2)
Sodium: 147 mmol/L — ABNORMAL HIGH (ref 134–144)
Total Protein: 6.5 g/dL (ref 6.0–8.5)
eGFR: 77 mL/min/{1.73_m2} (ref >59)

## 2021-03-05 LAB — LIPID PANEL
Chol/HDL Ratio: 2.8 ratio (ref 0.0–4.4)
Cholesterol, Total: 183 mg/dL (ref 100–199)
HDL: 65 mg/dL (ref >39)
LDL Chol Calc (NIH): 99 mg/dL (ref 0–99)
Triglycerides: 105 mg/dL (ref 0–149)
VLDL Cholesterol Cal: 19 mg/dL (ref 5–40)

## 2021-03-27 ENCOUNTER — Other Ambulatory Visit: Payer: Self-pay | Admitting: Adult Health

## 2021-03-30 ENCOUNTER — Telehealth: Payer: Self-pay | Admitting: Adult Health

## 2021-03-30 NOTE — Telephone Encounter (Signed)
This was taking care of. °

## 2021-03-30 NOTE — Telephone Encounter (Signed)
Patient called because she needs refill on clopidogrel (PLAVIX) 75 MG tablet   Refill needs to be sent before 2 so she can have prescription delivered.  Please send to   [place] [place] [address] Phone:  [phone]  Fax:  [phone]          Please advise

## 2021-03-30 NOTE — Telephone Encounter (Signed)
Spoke with patient to schedule Medicare Annual Wellness Visit (AWV) either virtually or in office.   Patient declined stating insurance came to home nov 2022  Last AWV 08/30/11  please schedule at anytime with [facility] [address] or 2   This should be a 45 minute visit.

## 2021-03-31 ENCOUNTER — Telehealth (HOSPITAL_BASED_OUTPATIENT_CLINIC_OR_DEPARTMENT_OTHER): Payer: Self-pay | Admitting: *Deleted

## 2021-03-31 DIAGNOSIS — E785 Hyperlipidemia, unspecified: Secondary | ICD-10-CM

## 2021-03-31 DIAGNOSIS — I10 Essential (primary) hypertension: Secondary | ICD-10-CM

## 2021-03-31 DIAGNOSIS — Z5181 Encounter for therapeutic drug level monitoring: Secondary | ICD-10-CM

## 2021-03-31 MED ORDER — SIMVASTATIN 20 MG PO TABS: 20.0000 mg | ORAL_TABLET | Freq: Every day | ORAL | 3 refills | Status: DC

## 2021-03-31 NOTE — Telephone Encounter (Signed)
-----   Message from Skeet Latch, MD sent at 03/29/2021  9:15 AM EST ----- Normal  kidney function.  Cholesterol is improving but not at goal.  LDL needs to be <70.  Increase simvastatin to 20mg . Repeat lipids/CMP in 2-3 months.

## 2021-03-31 NOTE — Telephone Encounter (Signed)
Advised patient of lab results and medication changes

## 2021-06-24 ENCOUNTER — Other Ambulatory Visit: Payer: Self-pay | Admitting: Adult Health

## 2021-06-24 DIAGNOSIS — K219 Gastro-esophageal reflux disease without esophagitis: Secondary | ICD-10-CM

## 2021-06-24 DIAGNOSIS — F419 Anxiety disorder, unspecified: Secondary | ICD-10-CM

## 2021-07-05 ENCOUNTER — Encounter (HOSPITAL_BASED_OUTPATIENT_CLINIC_OR_DEPARTMENT_OTHER): Payer: Self-pay | Admitting: Family

## 2021-07-05 ENCOUNTER — Ambulatory Visit (INDEPENDENT_AMBULATORY_CARE_PROVIDER_SITE_OTHER): Payer: Medicare Other | Admitting: Family

## 2021-07-05 VITALS — BP 130/64 | HR 64 | Ht 63.5 in | Wt 142.1 lb

## 2021-07-05 DIAGNOSIS — I25118 Atherosclerotic heart disease of native coronary artery with other forms of angina pectoris: Secondary | ICD-10-CM | POA: Diagnosis not present

## 2021-07-05 DIAGNOSIS — I10 Essential (primary) hypertension: Secondary | ICD-10-CM

## 2021-07-05 DIAGNOSIS — E785 Hyperlipidemia, unspecified: Secondary | ICD-10-CM

## 2021-07-05 DIAGNOSIS — N816 Rectocele: Secondary | ICD-10-CM

## 2021-07-05 DIAGNOSIS — Z789 Other specified health status: Secondary | ICD-10-CM

## 2021-07-05 LAB — LIPID PANEL
Chol/HDL Ratio: 2.7 ratio (ref 0.0–4.4)
Cholesterol, Total: 170 mg/dL (ref 100–199)
HDL: 62 mg/dL
LDL Chol Calc (NIH): 85 mg/dL (ref 0–99)
Triglycerides: 131 mg/dL (ref 0–149)
VLDL Cholesterol Cal: 23 mg/dL (ref 5–40)

## 2021-07-05 LAB — COMPREHENSIVE METABOLIC PANEL
ALT: 11 IU/L (ref 0–32)
AST: 20 IU/L (ref 0–40)
Albumin/Globulin Ratio: 2.1 (ref 1.2–2.2)
Albumin: 4.4 g/dL (ref 3.7–4.7)
Alkaline Phosphatase: 58 IU/L (ref 44–121)
BUN/Creatinine Ratio: 16 (ref 12–28)
BUN: 14 mg/dL (ref 8–27)
Bilirubin Total: 1 mg/dL (ref 0.0–1.2)
CO2: 26 mmol/L (ref 20–29)
Calcium: 9.8 mg/dL (ref 8.7–10.3)
Chloride: 104 mmol/L (ref 96–106)
Creatinine, Ser: 0.87 mg/dL (ref 0.57–1.00)
Globulin, Total: 2.1 g/dL (ref 1.5–4.5)
Glucose: 90 mg/dL (ref 70–99)
Potassium: 4.3 mmol/L (ref 3.5–5.2)
Sodium: 142 mmol/L (ref 134–144)
Total Protein: 6.5 g/dL (ref 6.0–8.5)
eGFR: 68 mL/min/{1.73_m2} (ref >59)

## 2021-07-05 MED ORDER — SIMVASTATIN 20 MG PO TABS: 20.0000 mg | ORAL_TABLET | ORAL | 3 refills | Status: DC

## 2021-07-05 MED ORDER — REPATHA SURECLICK 140 MG/ML ~~LOC~~ SOAJ: 140.0000 mg | SUBCUTANEOUS | 6 refills | Status: DC

## 2021-07-05 NOTE — Patient Instructions (Signed)
Medication Instructions:  ?Your physician has recommended you make the following change in your medication:  ? ?Change: Simvastatin to twice weekly  ? ?Start: Repatha once every 14 days  ? ?*If you need a refill on your cardiac medications before your next appointment, please call your pharmacy* ? ? ?Lab Work: ?Please return for Lab work In 3 months for fasting Lipid Panel and CMET. You may come to the...  ? ?[facility] (3rd floor) ?[address] [place], [place]  ?Open: 8am-Noon and 1pm-4:30pm  ? ?[place] [facility] ?[place]  ? ?[employer]- Any location ? ?**no appointments needed** ? ?If you have labs (blood work) drawn today and your tests are completely normal, you will receive your results only by: ?MyChart Message (if you have MyChart) OR ?A paper copy in the mail ?If you have any lab test that is abnormal or we need to change your treatment, we will call you to review the results. ? ? ?Testing/Procedures: ?Your EKG looks good today!  ? ? ?Follow-Up: ?At [facility], you and your health needs are our priority.  As part of our continuing mission to provide you with exceptional heart care, we have created designated Provider Care Teams.  These Care Teams include your primary Cardiologist (physician) and Advanced Practice Providers (APPs -  Physician Assistants and Nurse Practitioners) who all work together to provide you with the care you need, when you need it. ? ?We recommend signing up for the patient portal called "MyChart".  Sign up information is provided on this After Visit Summary.  MyChart is used to connect with patients for Virtual Visits (Telemedicine).  Patients are able to view lab/test results, encounter notes, upcoming appointments, etc.  Non-urgent messages can be sent to your provider as well.   ?To learn more about what you can do with MyChart, go to [email or web address].   ? ?Your next appointment:   ?4 month(s) ? ?The  format for your next appointment:   ?In Person ? ?Provider:   ?Skeet Latch, MD or Laurann Montana, NP{ ? ?Other Instructions ?We have referred you to OBGYN!  ? ?Important Information About [place] ? ? ? ? ? ? ?

## 2021-07-05 NOTE — Progress Notes (Signed)
? ?Office Visit  ?  ?Patient Name: Alexandria [name] ?Date of Encounter: 07/05/2021 ? ?PCP:  [name], NP ?  ?[place]  ?Cardiologist:  Skeet Latch, MD  ?Advanced Practice Provider:  No care team member to display ?Electrophysiologist:  None  ?   ? ?Chief Complaint  ?  ?Alexandria [name] is a 81 y.o. female with a hx of CAD, DVT, anemia, arthritis, anxiety, asthma, acute brainstem infarct, right breast cancer s/p radiation therapy, COPD, DJD, depression, atrial fibrillation, GERD, hearing loss, glomerulonephritis, hyperlipidemia, hypertension, IBS, pneumonia, PVD (varicose veins), spinal stenosis, left traumatic hemopneumothorax presents today for follow-up of hypertension ? ?Past Medical History  ?  ?Past Medical History:  ?Diagnosis Date  ? Allergy   ? Anemia   ? hx of   ? Anxiety   ? Arthritis   ? left knee, hands, back  ? Asthma   ? daily and prn inhalers  ? Atypical chest pain 11/26/2020  ? Brainstem infarct, acute ([place]) 03/2011  ? slight expressive aphasia, occ. problems with balance  ? Breast cancer ([place]) 12/2011  ? right, ER+, PR -, Her 2 -  ? COPD (chronic obstructive pulmonary disease) ([place])   ? Degenerative joint disease of low back   ? Depression   ? Dysrhythmia   ? atrial fib   ? GERD (gastroesophageal reflux disease)   ? Hearing loss   ? bilateral hearing aids  ? 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 and had abscess left kidney  ? History of kidney stones   ? Hx of radiation therapy 03/12/12 -04/13/12  ? right breast  ? Hyperlipidemia   ? Hypertension   ? under control, has been on med. since age 20  ? IBS (irritable bowel syndrome)   ? Long term current use of aromatase inhibitor 05/08/2012  ? Overactive bladder   ? Palpitations   ? Pneumonia   ? hx of x 2   ? PONV (postoperative nausea and vomiting)   ? PVD (peripheral vascular disease) ([place])   ? varicose veins - left worse than right  ? Spinal stenosis   ? Stress incontinence   ? Stroke [facility])   ? problem with  expressive communicaiton  ? Traumatic hemopneumothorax 1982  ? left  ? ?Past Surgical History:  ?Procedure Laterality Date  ? ANKLE HARDWARE REMOVAL    ? right  ? BREAST BIOPSY  1976  ? right  ? BREAST BIOPSY Left 2019  ? benign lymph node  ? BREAST LUMPECTOMY  2013  ? right with snbx  ? CHOLECYSTECTOMY  1990s  ? COLONOSCOPY    ? CYSTOSCOPY  07/05/2005  ? KNEE SURGERY  1980s  ? left  ? KYPHOPLASTY N/A 06/15/2017  ? Procedure: KYPHOPLASTY T11;  Surgeon: Melina Schools, MD;  Location: [place];  Service: Orthopedics;  Laterality: N/A;  90 mins  ? ORIF ANKLE FRACTURE  1980s  ? right   ? TOTAL KNEE ARTHROPLASTY Left 12/08/2015  ? Procedure: LEFT TOTAL KNEE ARTHROPLASTY;  Surgeon: Paralee Cancel, MD;  Location: [facility];  Service: Orthopedics;  Laterality: Left;  ? TRANSOBTURATOR SLING  07/05/2005  ? TUBAL LIGATION  1976  ? VEIN SURGERY    ? vein ablation left leg  ? ? ?Allergies ? ?Allergies  ?Allergen Reactions  ? Augmentin [Amoxicillin-Pot Clavulanate] Itching, Rash and Other (See Comments)  ?  PT DEVELOPED SEVERE RASH INVOLVING MUCUS MEMBRANES or SKIN NECROSIS WITH PENICILLINS: #  #  #  YES  #  #  #    ?  Penicillins Hives and Other (See Comments)  ?  Has patient had a PCN reaction causing immediate rash, facial/tongue/throat swelling, SOB or lightheadedness with hypotension: No ?HAS PT DEVELOPED SEVERE RASH INVOLVING MUCUS MEMBRANES or SKIN NECROSIS: #  #  #  YES  #  #  #   ?Has patient had a PCN reaction that required hospitalization: No ?Has patient had a PCN reaction occurring within the last 10 years: No ?  ? Aspirin Hives  ? Chlorhexidine Gluconate Rash  ? Chocolate Hives and Other (See Comments)  ?  RUNNY NOSE ?  ? Coffee Bean Extract Hives and Other (See Comments)  ?  RUNNY NOSE ?  ? Diclofenac Other (See Comments)  ? Ibuprofen Hives and Other (See Comments)  ?  RUNNY NOSE  ? Oxycodone Hcl Hives and Nausea And Vomiting  ? Shrimp [Shellfish Allergy] Hives and Other (See Comments)  ?  RUNNY NOSE ?  ? Sulfonamide Derivatives  Hives  ? Lipitor [Atorvastatin Calcium]   ?  Leg Pain/Joint Pain/Back Pain  ? ? ?History of Present Illness  ?  ?Alexandria [name] is a 81 y.o. female with a hx of CAD, DVT, anemia, arthritis, anxiety, asthma, acute brainstem infarct, right breast cancer s/p radiation therapy, COPD, DJD, depression, atrial fibrillation, GERD, hearing loss, glomerulonephritis, hyperlipidemia, hypertension, IBS, pneumonia, PVD (varicose veins), spinal stenosis, left traumatic hemopneumothorax last seen 11/26/2020 by Dr. Oval Linsey. ? ?Prior stress test 2010 with EF 82%, no ischemia nor infarct. Previously evaluated in 2014 by Dr. Percival Spanish after diagnosis of stroke -due to palpitations atenolol was transitioned to Toprol.  Echocardiogram was recommended and performed 05/2012 revealing EF 60%, no RWMA, no cardiac source of emboli.  Myoview 2014 with no evidence of ischemia nor infarct. ? ?Evaluated 11/26/2020 after referral from PCP for evaluation of hypertension.  Noted prior to a scheduled posterior vaginal wall repair 05/31/2020 she had mechanical fall.  Twisted right ankle and fell flat in her driveway.  Subsequently developed 3 small thrombi above the ventricles in her posterior head.  Another fall where she fell backwards on concrete and fractured T11 vertebrae.  Then tore her left rotator cuff.  Posterior vaginal wall repair was never able to be completed due to these medical issues but she was inclined to have that surgery.  She admitted chest pain which she attributed to GERD.  However also with exertional component and was recommended for ischemic evaluation. She did not previous muscle cramps with Atorvastatin. Cardiac CTA 11/2020 coronary calcium score of 71 placing her in the 47th percentile for age and sex matched control (RCA 25-49%, proximal LAD 0-24%, proximal large first diagonal 25-49%).  FFR with no significant stenosis. ? ?She presents today for follow-up.  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  She tells me her lower extremity edema is unchanged though manageable with compression stockings.  Notes still with occasional word finding difficulties due to her history of stroke.  Since last seen she denies chest pain, dyspnea, orthopnea, PND, palpitations.  She does note increased myalgias with taking simvastatin 20 mg daily which she additionally noticed on atorvastatin. ? ? ?EKGs/Labs/Other Studies Reviewed:  ? ?The following studies were reviewed today: ? ?Cardiac CTA 12/04/2018 ? Aorta:  Normal size.  Mild calcifications.  No dissection. ?  ?Aortic Valve: No calcifications. ?  ?Coronary Arteries:  Normal coronary origin.  Right dominance. ?  ?RCA is a large dominant artery that gives rise to PDA  and PLA. There ?is proximal calcified plaque, 25-49% stenosis. ?  ?Left main is a large artery that gives rise to LAD and LCX arteries. ?  ?LAD is a large vessel that has proximal calcified plaque, 0-24% ?stenosis, proximal large first diagonal calcified plaque, 25-49% ?stenosis. ?  ?LCX is a non-dominant artery that gives rise to one large OM1 ?branch. There is no plaque. ?  ?Other findings: ?  ?Normal pulmonary vein drainage into the left atrium. ?  ?Normal left atrial appendage without a thrombus. ?  ?Normal size of the pulmonary artery. ?  ?Please see radiology report for non cardiac findings. ?  ?IMPRESSION: ?1. Coronary calcium score of 71 (LAD 50, RCA 21). This was 54 ?percentile for age and sex matched control. ?  ?2. Normal coronary origin with right dominance. ?  ?3. Moderate CAD in LAD and RCA distribution. Will send for FFR ?analysis. ?  ?4.  Aortic atherosclerosis. ?  ?CAD-RADS 2. Mild non-obstructive CAD (25-49%). Consider ?non-atherosclerotic causes of chest pain. Consider preventive ?therapy and risk factor modification. ? ?FFR ?1. Left Main: No significant stenosis. ?  ?2. LAD: No significant stenosis. ?3. LCX: No significant stenosis. ?4. RCA: No significant stenosis. ?   ?IMPRESSION: ?1.  CT FFR analysis didn't show any significant stenosis. ? ? ?LE Venous Reflux 03/04/2020: ?Summary:  ?Right:  ?- No evidence of deep vein thrombosis from the common femoral through the  ?pop

## 2021-07-06 ENCOUNTER — Encounter (HOSPITAL_BASED_OUTPATIENT_CLINIC_OR_DEPARTMENT_OTHER): Payer: Self-pay

## 2021-07-12 ENCOUNTER — Other Ambulatory Visit: Payer: Self-pay | Admitting: Pharmacist

## 2021-07-12 MED ORDER — REPATHA SURECLICK 140 MG/ML ~~LOC~~ SOAJ: 140.0000 mg | SUBCUTANEOUS | 11 refills | Status: DC

## 2021-07-27 ENCOUNTER — Telehealth: Payer: Self-pay | Admitting: Pulmonary Disease

## 2021-07-27 MED ORDER — FLUTICASONE-SALMETEROL 230-21 MCG/ACT IN AERO: 2.0000 | INHALATION_SPRAY | Freq: Two times a day (BID) | RESPIRATORY_TRACT | 6 refills | Status: DC

## 2021-07-27 NOTE — Telephone Encounter (Signed)
Called patient and just confirmed where she would like her refill sent to. Nothing further needed!  ?

## 2021-08-12 ENCOUNTER — Telehealth (HOSPITAL_BASED_OUTPATIENT_CLINIC_OR_DEPARTMENT_OTHER): Payer: Self-pay | Admitting: Family

## 2021-08-12 NOTE — Telephone Encounter (Signed)
Noted - thanks! Will likely not need to see Dr. Garwin Brothers. Will defer furter uro-obgyn follow up to Dr. Reva Bores.   Loel Dubonnet, NP

## 2021-08-12 NOTE — Telephone Encounter (Signed)
Called patient to follow up on our referral to Dr. Garwin Brothers.  She wants you to know Dr. Reva Bores has her surgery scheduled Monday 08/30/21

## 2021-08-27 ENCOUNTER — Other Ambulatory Visit: Payer: Self-pay | Admitting: Pulmonary Disease

## 2021-08-27 DIAGNOSIS — J4530 Mild persistent asthma, uncomplicated: Secondary | ICD-10-CM

## 2021-09-23 ENCOUNTER — Other Ambulatory Visit: Payer: Self-pay

## 2021-09-23 DIAGNOSIS — J449 Chronic obstructive pulmonary disease, unspecified: Secondary | ICD-10-CM

## 2021-09-24 ENCOUNTER — Ambulatory Visit: Payer: Medicare Other | Admitting: Pulmonary Disease

## 2021-09-24 NOTE — Progress Notes (Deleted)
Synopsis: Self referred in 03/2020 for COPD  Subjective:   PATIENT ID: Alexandria [name] GENDER: female DOB: 1940/10/11, MRN: [id number]  Scheduled for posterior vaginal surgery, rectocele.  Then hurt herself after a fall in her drive. Seeing orthopedics for her left shoulder. Hurt the left side of her face.    Larey Seat last week again.  Having issues with phlebitis in her legs and arms.   HPI  No chief complaint on file.  Alexandria [name] is a 81 year old woman, former smoker with history of asthma who returns to pulmonary clinic for follow up after pulmonary function testing.  She had pulmonary function tests on 05/26/20 which were normal. 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   She also complains of sinus congestion and pressure. She is using advair 230-39mcg 2 puffs twice daily and as needed albuterol.   She is planning to have surgery for a rectocele but this has been delayed due to her recent fall.  Past Medical History:  Diagnosis Date  . Allergy   . Anemia    hx of   . Anxiety   . Arthritis    left knee, hands, back  . Asthma    daily and prn inhalers  . Atypical chest pain 11/26/2020  . 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 problems with balance  . Breast cancer (HCC) 12/2011   right, ER+, PR -, Her 2 -  . COPD (chronic obstructive pulmonary disease) (HCC)   . Degenerative joint disease of low back   . Depression   . Dysrhythmia    atrial fib   . GERD (gastroesophageal reflux disease)   . Hearing loss    bilateral hearing aids  . 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 History of kidney stones   . Hx of radiation therapy 03/12/12 -04/13/12   right breast  . Hyperlipidemia   . Hypertension    under control, has been on med. since age 43   . IBS (irritable bowel syndrome)   . Long term current use of aromatase inhibitor 05/08/2012  . Overactive bladder   . Palpitations   . Pneumonia    hx of x 2   . PONV (postoperative nausea and vomiting)   . PVD (peripheral vascular disease) (HCC)    varicose veins - left worse than right  . Spinal stenosis   . Stress incontinence   . Stroke [facility])    problem with expressive communicaiton  . Traumatic hemopneumothorax 1982   left     Family History  Problem Relation Age of Onset  . Stroke Mother   . Heart attack Father        Died age 21  . Cancer Maternal Aunt        breast  . Cancer Paternal Aunt        Multiple Myeloma  . Breast cancer Paternal Aunt        late 82s-60s  . Breast cancer Paternal Aunt        bilateral breast cancer  . Dementia Paternal Aunt   . Stomach cancer Paternal Uncle   . Breast cancer Paternal Uncle   . Congestive Heart Failure Paternal Uncle        Ischemic  . Congestive Heart Failure  Paternal Uncle        Ischemic  . Heart attack Paternal Grandfather   . Breast cancer Cousin        early 46s  . Breast cancer Daughter      Social History   Socioeconomic History  . Marital status: Widowed    Spouse name: Not on file  . Number of children: Not on file  . Years of education: Not on file  . Highest education level: Not on file  Occupational History  . Not on file  Tobacco Use  . Smoking status: Former    Packs/day: 0.50    Years: 15.00    Total pack years: 7.50    Types: Cigarettes    Quit date: 08/28/1993    Years since quitting: 28.0  . Smokeless tobacco: Never  Vaping Use  . Vaping Use: Never used  Substance and Sexual Activity  . Alcohol use: No    Alcohol/week: 0.0 standard drinks of alcohol  . Drug use: No  . Sexual activity: Not Currently  Other Topics Concern  . Not on file  Social History Narrative   Widowed since 2014   Retired Scientist, product/process development    Son and daughter   Social Determinants of Health   Financial  Resource Strain: [place]  (11/26/2020)   Overall Financial Resource Strain (CARDIA)   . Difficulty of Paying Living Expenses: Not hard at all  Food Insecurity: No Food Insecurity (11/26/2020)   Hunger Vital Sign   . Worried About Charity fundraiser in the Last Year: Never true   . Ran Out of Food in the Last Year: Never true  Transportation Needs: No Transportation Needs (11/26/2020)   PRAPARE - Transportation   . Lack of Transportation (Medical): No   . Lack of Transportation (Non-Medical): No  Physical Activity: Insufficiently Active (11/26/2020)   Exercise Vital Sign   . Days of Exercise per Week: 3 days   . Minutes of Exercise per Session: 30 min  Stress: Not on file  Social Connections: Not on file  Intimate Partner Violence: Not on file     Allergies  Allergen Reactions  . Augmentin [Amoxicillin-Pot Clavulanate] Itching, Rash and Other (See Comments)    PT DEVELOPED SEVERE RASH INVOLVING MUCUS MEMBRANES or SKIN NECROSIS WITH PENICILLINS: #  #  #  YES  #  #  #    . Penicillins Hives and Other (See Comments)    Has patient had a PCN reaction causing immediate rash, facial/tongue/throat swelling, SOB or lightheadedness with hypotension: No HAS PT DEVELOPED SEVERE RASH INVOLVING MUCUS MEMBRANES or SKIN NECROSIS: #  #  #  YES  #  #  #   Has patient had a PCN reaction that required hospitalization: No Has patient had a PCN reaction occurring within the last 10 years: No   . Aspirin Hives  . Chlorhexidine Gluconate Rash  . Chocolate Hives and Other (See Comments)    RUNNY NOSE   . Coffee Bean Extract Hives and Other (See Comments)    RUNNY NOSE   . Diclofenac Other (See Comments)  . Ibuprofen Hives and Other (See Comments)    RUNNY NOSE  . Oxycodone Hcl Hives and Nausea And Vomiting  . Shrimp [Shellfish Allergy] Hives and Other (See Comments)    RUNNY NOSE   . Sulfonamide Derivatives Hives  . Lipitor [Atorvastatin Calcium]     Leg Pain/Joint Pain/Back Pain      Outpatient Medications Prior to Visit  Medication  Sig Dispense Refill  . albuterol (PROAIR HFA) 108 (90 Base) MCG/ACT inhaler Inhale 2 puffs into the lungs every 6 (six) hours as needed for wheezing or shortness of breath. 8 g 5  . amLODipine (NORVASC) 10 MG tablet TAKE 1 TABLET EACH DAY. 90 tablet 3  . ARNICA EX Apply 1 application topically daily as needed (pain).    Marland Kitchen b complex vitamins tablet Take 1 tablet by mouth 2 (two) times daily. PLUS FOLIC ACID PLUS VIT. C    . Biotin 5000 MCG CAPS Take 5,000 mcg by mouth daily.     . Capsaicin 0.025 % PADS Apply 1 application topically daily as needed (pain).    . Cholecalciferol (VITAMIN D-3) 5000 UNITS TABS Take 5,000 Units by mouth daily.     . clopidogrel (PLAVIX) 75 MG tablet TAKE 1 TABLET BY MOUTH DAILY. 90 tablet 3  . CO-ENZYME Q10-VITAMIN E PO Take 1 tablet by mouth daily.    . Diclofenac Sodium (PENNSAID) 2 % SOLN Apply 1 application topically 3 (three) times daily. 112 g 0  . diphenhydrAMINE (BENADRYL) 25 MG tablet Take 25 mg by mouth every 6 (six) hours as needed. Patient taking 1/2 (12.5 mg) as needed for allergies and itching    . escitalopram (LEXAPRO) 10 MG tablet TAKE ONE TABLET BY MOUTH ONCE DAILY 90 tablet 1  . Evolocumab (REPATHA SURECLICK) 140 MG/ML SOAJ Inject 140 mg into the skin every 14 (fourteen) days. 2 mL 11  . fluticasone (FLONASE) 50 MCG/ACT nasal spray USE 1 SPRAY IN EACH NOSTRIL DAILY. 16 g 5  . fluticasone-salmeterol (ADVAIR HFA) 230-21 MCG/ACT inhaler Inhale 2 puffs into the lungs 2 (two) times daily. 1 each 6  . hydrochlorothiazide (MICROZIDE) 12.5 MG capsule TAKE (1) CAPSULE TWICE DAILY. 180 capsule 3  . ipratropium (ATROVENT) 0.03 % nasal spray Place 2 sprays into both nostrils every 12 (twelve) hours. 30 mL 12  . loratadine (CLARITIN) 10 MG tablet Take 10 mg by mouth daily as needed for allergies.    . Misc Natural Products (OSTEO BI-FLEX ADV TRIPLE ST PO) Take 1 tablet by mouth daily.    . montelukast  (SINGULAIR) 10 MG tablet Take 1 tablet (10 mg total) by mouth at bedtime. 30 tablet 0  . ondansetron (ZOFRAN-ODT) 4 MG disintegrating tablet DISSOLVE 1 TABLET ON TONGUE EVERY 8 HOURS AS NEEDED FOR NAUSEA/VOMITING. 20 tablet 1  . pantoprazole (PROTONIX) 40 MG tablet TAKE 1 TABLET BY MOUTH DAILY. 90 tablet 3  . Polyethyl Glycol-Propyl Glycol (SYSTANE OP) Place 1 drop into both eyes daily as needed. Dry eyes    . potassium chloride SA (KLOR-CON) 20 MEQ tablet TAKE 3 TABLETS DAILY. 270 tablet 3  . Probiotic Product (ALIGN PO) Take 1 tablet by mouth daily.    . Selenium 200 MCG CAPS Take 200 mcg by mouth 2 (two) times daily.     . simvastatin (ZOCOR) 20 MG tablet Take 1 tablet (20 mg total) by mouth 2 (two) times a week. 25 tablet 3  . Trolamine Salicylate (ASPERCREME EX) Apply 1 application topically daily as needed (pain).     No facility-administered medications prior to visit.    Review of Systems  Constitutional:  Negative for chills, fever, malaise/fatigue and weight loss.  HENT:  Positive for congestion.   Respiratory:  Positive for cough and sputum production. Negative for hemoptysis, shortness of breath, wheezing and stridor.   Cardiovascular:  Negative for chest pain, palpitations, orthopnea, claudication, leg swelling and PND.  Gastrointestinal:  Positive for heartburn. Negative for abdominal pain, constipation, diarrhea, nausea and vomiting.  Genitourinary: Negative.   Musculoskeletal: Negative.   Skin:  Negative for rash.  Neurological: Negative.   Endo/Heme/Allergies: Negative.   Psychiatric/Behavioral: Negative.     Objective:   There were no vitals filed for this visit.    Physical Exam Constitutional:      General: She is not in acute distress.    Appearance: She is normal weight. She is not ill-appearing.  HENT:     Head: Normocephalic and atraumatic.  Eyes:     General: No scleral icterus.    Conjunctiva/sclera: Conjunctivae normal.     Pupils: Pupils are equal,  round, and reactive to light.  Cardiovascular:     Rate and Rhythm: Normal rate and regular rhythm.     Pulses: Normal pulses.     Heart sounds: Normal heart sounds. No murmur heard. Pulmonary:     Effort: Pulmonary effort is normal.     Breath sounds: No wheezing, rhonchi or rales.  Abdominal:     General: Bowel sounds are normal.     Palpations: Abdomen is soft.  Musculoskeletal:     Right lower leg: No edema.     Left lower leg: No edema.  Skin:    General: Skin is warm and dry.  Neurological:     General: No focal deficit present.     Mental Status: She is alert.  Psychiatric:        Mood and Affect: Mood normal.        Behavior: Behavior normal.        Thought Content: Thought content normal.        Judgment: Judgment normal.    CBC    Component Value Date/Time   WBC 7.7 02/16/2021 1440   RBC 4.50 02/16/2021 1440   HGB 12.9 02/16/2021 1440   HGB WILL FOLLOW 07/27/2017 1647   HGB 13.1 10/06/2014 1036   HCT 39.4 02/16/2021 1440   HCT WILL FOLLOW 07/27/2017 1647   HCT 38.7 10/06/2014 1036   PLT 241.0 02/16/2021 1440   PLT WILL FOLLOW 07/27/2017 1647   MCV 87.4 02/16/2021 1440   MCV WILL FOLLOW 07/27/2017 1647   MCV 89.5 10/06/2014 1036   MCH 29.4 02/13/2020 1800   MCHC 32.8 02/16/2021 1440   RDW 13.9 02/16/2021 1440   RDW WILL FOLLOW 07/27/2017 1647   RDW 14.1 10/06/2014 1036   LYMPHSABS 2.3 02/16/2021 1440   LYMPHSABS WILL FOLLOW 07/27/2017 1647   LYMPHSABS 1.8 10/06/2014 1036   MONOABS 0.6 02/16/2021 1440   MONOABS 0.5 10/06/2014 1036   EOSABS 0.1 02/16/2021 1440   EOSABS WILL FOLLOW 07/27/2017 1647   BASOSABS 0.1 02/16/2021 1440   BASOSABS WILL FOLLOW 07/27/2017 1647   BASOSABS 0.1 10/06/2014 1036      Latest Ref Rng & Units 07/05/2021    9:54 AM 03/04/2021    8:07 AM 12/01/2020   10:38 AM  BMP  Glucose 70 - 99 mg/dL 90  96  100   BUN 8 - 27 mg/dL $Remove'14  20  14   'vlTSEtP$ Creatinine 0.57 - 1.00 mg/dL 0.87  0.79  0.96   BUN/Creat Ratio 12 - $Re'28 16  25  15    'zhM$ Sodium 134 - 144 mmol/L 142  147  140   Potassium 3.5 - 5.2 mmol/L 4.3  3.9  4.3   Chloride 96 - 106 mmol/L 104  104  101   CO2 20 - 29 mmol/L 26  27  24   Calcium 8.7 - 10.3 mg/dL 9.8  9.8  9.7    Chest imaging: CXR 02/13/20 The lungs are clear. Heart size is normal. No pneumothorax or pleural effusion. Aortic atherosclerosis. Surgical clips projecting over the right breast and chronic deformity of left ribs again seen.  PFT:    Latest Ref Rng & Units 05/26/2020   11:50 AM  PFT Results  FVC-Pre L 2.41   FVC-Predicted Pre % 90   FVC-Post L 2.49   FVC-Predicted Post % 93   Pre FEV1/FVC % % 81   Post FEV1/FCV % % 80   FEV1-Pre L 1.95   FEV1-Predicted Pre % 97   FEV1-Post L 1.99   DLCO uncorrected ml/min/mmHg 16.91   DLCO UNC% % 90   DLCO corrected ml/min/mmHg 16.91   DLCO COR %Predicted % 90   DLVA Predicted % 104   TLC L 4.79   TLC % Predicted % 96   RV % Predicted % 95   05/26/20: Normal PFTs    Assessment & Plan:   No diagnosis found.  Discussion: Alexandria [name] is a 81 year old woman, former smoker with history of asthma who returns to pulmonary clinic for follow up after pulmonary function testing.  She has persistent reactive airways disease which seem to be aggravated by her GERD and post-nasal drainage.   She is to continue on pantoprazole $RemoveBeforeD'40mg'VAVOijESESICSG$  daily for GERD.   She is to use flonase 2 sprays per nostril daily, start ipratropium nasal spray 2 sprays per nostril twice daily. She is to start montelukast $RemoveBeforeDEI'10mg'DZNHtidGjVjbIhVl$  daily. She is to continue her Advair 230-16mcg 2 puffs twice daily and as needed albuterol.   In regards to her upcoming surgical planning, her ARISCAT preoperative pulmonary risk index is 1.6% placing her at low risk for pulmonary related complications.  Follow up in 6 months.  Freda Jackson, MD [facility] Office: [phone]    Current Outpatient Medications:  .  albuterol (PROAIR HFA) 108 (90 Base) MCG/ACT inhaler, Inhale 2  puffs into the lungs every 6 (six) hours as needed for wheezing or shortness of breath., Disp: 8 g, Rfl: 5 .  amLODipine (NORVASC) 10 MG tablet, TAKE 1 TABLET EACH DAY., Disp: 90 tablet, Rfl: 3 .  ARNICA EX, Apply 1 application topically daily as needed (pain)., Disp: , Rfl:  .  b complex vitamins tablet, Take 1 tablet by mouth 2 (two) times daily. PLUS FOLIC ACID PLUS VIT. C, Disp: , Rfl:  .  Biotin 5000 MCG CAPS, Take 5,000 mcg by mouth daily. , Disp: , Rfl:  .  Capsaicin 0.025 % PADS, Apply 1 application topically daily as needed (pain)., Disp: , Rfl:  .  Cholecalciferol (VITAMIN D-3) 5000 UNITS TABS, Take 5,000 Units by mouth daily. , Disp: , Rfl:  .  clopidogrel (PLAVIX) 75 MG tablet, TAKE 1 TABLET BY MOUTH DAILY., Disp: 90 tablet, Rfl: 3 .  CO-ENZYME Q10-VITAMIN E PO, Take 1 tablet by mouth daily., Disp: , Rfl:  .  Diclofenac Sodium (PENNSAID) 2 % SOLN, Apply 1 application topically 3 (three) times daily., Disp: 112 g, Rfl: 0 .  diphenhydrAMINE (BENADRYL) 25 MG tablet, Take 25 mg by mouth every 6 (six) hours as needed. Patient taking 1/2 (12.5 mg) as needed for allergies and itching, Disp: , Rfl:  .  escitalopram (LEXAPRO) 10 MG tablet, TAKE ONE TABLET BY MOUTH ONCE DAILY, Disp: 90 tablet, Rfl: 1 .  Evolocumab (REPATHA SURECLICK) 272 MG/ML SOAJ,  Inject 140 mg into the skin every 14 (fourteen) days., Disp: 2 mL, Rfl: 11 .  fluticasone (FLONASE) 50 MCG/ACT nasal spray, USE 1 SPRAY IN EACH NOSTRIL DAILY., Disp: 16 g, Rfl: 5 .  fluticasone-salmeterol (ADVAIR HFA) 230-21 MCG/ACT inhaler, Inhale 2 puffs into the lungs 2 (two) times daily., Disp: 1 each, Rfl: 6 .  hydrochlorothiazide (MICROZIDE) 12.5 MG capsule, TAKE (1) CAPSULE TWICE DAILY., Disp: 180 capsule, Rfl: 3 .  ipratropium (ATROVENT) 0.03 % nasal spray, Place 2 sprays into both nostrils every 12 (twelve) hours., Disp: 30 mL, Rfl: 12 .  loratadine (CLARITIN) 10 MG tablet, Take 10 mg by mouth daily as needed for allergies., Disp: , Rfl:  .   Misc Natural Products (OSTEO BI-FLEX ADV TRIPLE ST PO), Take 1 tablet by mouth daily., Disp: , Rfl:  .  montelukast (SINGULAIR) 10 MG tablet, Take 1 tablet (10 mg total) by mouth at bedtime., Disp: 30 tablet, Rfl: 0 .  ondansetron (ZOFRAN-ODT) 4 MG disintegrating tablet, DISSOLVE 1 TABLET ON TONGUE EVERY 8 HOURS AS NEEDED FOR NAUSEA/VOMITING., Disp: 20 tablet, Rfl: 1 .  pantoprazole (PROTONIX) 40 MG tablet, TAKE 1 TABLET BY MOUTH DAILY., Disp: 90 tablet, Rfl: 3 .  Polyethyl Glycol-Propyl Glycol (SYSTANE OP), Place 1 drop into both eyes daily as needed. Dry eyes, Disp: , Rfl:  .  potassium chloride SA (KLOR-CON) 20 MEQ tablet, TAKE 3 TABLETS DAILY., Disp: 270 tablet, Rfl: 3 .  Probiotic Product (ALIGN PO), Take 1 tablet by mouth daily., Disp: , Rfl:  .  Selenium 200 MCG CAPS, Take 200 mcg by mouth 2 (two) times daily. , Disp: , Rfl:  .  simvastatin (ZOCOR) 20 MG tablet, Take 1 tablet (20 mg total) by mouth 2 (two) times a week., Disp: 25 tablet, Rfl: 3 .  Trolamine Salicylate (ASPERCREME EX), Apply 1 application topically daily as needed (pain)., Disp: , Rfl:

## 2021-10-03 ENCOUNTER — Other Ambulatory Visit: Payer: Self-pay | Admitting: Adult Health

## 2021-10-03 DIAGNOSIS — K219 Gastro-esophageal reflux disease without esophagitis: Secondary | ICD-10-CM

## 2021-10-11 ENCOUNTER — Other Ambulatory Visit: Payer: Self-pay | Admitting: Pulmonary Disease

## 2021-10-11 DIAGNOSIS — R0982 Postnasal drip: Secondary | ICD-10-CM

## 2021-10-31 ENCOUNTER — Other Ambulatory Visit: Payer: Self-pay | Admitting: Pulmonary Disease

## 2021-10-31 ENCOUNTER — Other Ambulatory Visit: Payer: Self-pay | Admitting: Adult Health

## 2021-10-31 DIAGNOSIS — I10 Essential (primary) hypertension: Secondary | ICD-10-CM

## 2021-10-31 DIAGNOSIS — J4530 Mild persistent asthma, uncomplicated: Secondary | ICD-10-CM

## 2021-11-02 NOTE — Therapy (Unsigned)
OUTPATIENT PHYSICAL THERAPY FEMALE PELVIC EVALUATION   Patient Name: Alexandria [name] MRN: [id number] DOB:1940-04-19, 81 y.o., female Today's Date: 11/03/2021   PT End of Session - 11/03/21 1442     Visit Number 1    Date for PT Re-Evaluation 01/26/22    Authorization Type [employer] medicare    Authorization - Visit Number 1    Authorization - Number of Visits 10    PT Start Time 1400    PT Stop Time 6387    PT Time Calculation (min) 45 min    Activity Tolerance Patient tolerated treatment well    Behavior During Therapy WFL for tasks assessed/performed             Past Medical History:  Diagnosis Date   Allergy    Anemia    hx of    Anxiety    Arthritis    left knee, hands, back   Asthma    daily and prn inhalers   Atypical chest pain 11/26/2020   Brainstem infarct, 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 since age 5   IBS (irritable bowel syndrome)    Long term current use of aromatase inhibitor 05/08/2012   Overactive bladder    Palpitations    Pneumonia    hx of x 2    PONV (postoperative nausea and vomiting)    PVD (peripheral vascular disease) (HCC)    varicose veins - left worse than right   Spinal stenosis    Stress incontinence    Stroke ([place])    problem with expressive communicaiton   Traumatic hemopneumothorax 1982   left   Past Surgical History:  Procedure Laterality Date   ANKLE HARDWARE REMOVAL     right    BREAST BIOPSY  1976   right   BREAST BIOPSY Left 2019   benign lymph node   BREAST LUMPECTOMY  2013   right with snbx   CHOLECYSTECTOMY  1990s   COLONOSCOPY     CYSTOSCOPY  07/05/2005   KNEE SURGERY  1980s   left   KYPHOPLASTY N/A 06/15/2017   Procedure: KYPHOPLASTY T11;  Surgeon: Melina Schools, MD;  Location: [facility];  Service: Orthopedics;  Laterality: N/A;  90 mins   ORIF ANKLE FRACTURE  1980s   right    TOTAL KNEE ARTHROPLASTY Left 12/08/2015   Procedure: LEFT TOTAL KNEE ARTHROPLASTY;  Surgeon: Paralee Cancel, MD;  Location: [facility];  Service: Orthopedics;  Laterality: Left;   TRANSOBTURATOR SLING  07/05/2005   TUBAL LIGATION  1976   VEIN SURGERY     vein ablation left leg   Patient Active Problem List   Diagnosis Date Noted   Atypical chest pain 11/26/2020   S/P kyphoplasty 06/15/2017   S/P total knee replacement using cement 12/08/2015   Atrial fibrillation ([place]) 11/19/2015  Anxiety and depression 07/09/2015   Varicose veins of bilateral lower extremities with other complications 27/78/2423   Essential hypertension, benign 12/08/2014   Acute hemorrhagic cystitis 02/18/2014   Stroke ([place]) 05/15/2012   Spinal stenosis    COPD (chronic obstructive pulmonary disease) ([place])    Long term current use of aromatase inhibitor 05/08/2012   Hx of radiation therapy    Breast cancer of upper-inner quadrant of right female breast ([place]) 12/23/2011   Ataxia 06/23/2011   Contact dermatitis and eczema due to plant 01/04/2011   BACK PAIN 08/11/2009   PALPITATIONS, RECURRENT 02/27/2009   PERS HX TOBACCO USE PRESENTING HAZARDS HEALTH 12/17/2008   Hyperlipidemia 02/26/2008   ASTHMA 02/26/2008   GERD 02/12/2007    PCP: [name], NP  REFERRING PROVIDER: Damian Leavell, MD  REFERRING DIAG: N39.46 (ICD-10-CM) - Mixed incontinence  THERAPY DIAG:  Muscle weakness (generalized)  Other lack of coordination  Rationale for Evaluation and Treatment Rehabilitation  ONSET DATE:  08/30/2021  SUBJECTIVE:                                                                                                                                                                                           SUBJECTIVE STATEMENT: I had surgery , Posterior repair, perineoplasty cystoscopy, urethral bulking, 08/30/2021. I do not have incontinence. .  Every now and then I will have some soiling.      PAIN:  Are you having pain? Yes NPRS scale: 6/10 Pain location:  pelvic floor  Pain type: discomfort Pain description: intermittent   Aggravating factors: in the morning Relieving factors: later in the day  PRECAUTIONS: Other: had a stroke ; hearing deficits, Melanoma  WEIGHT BEARING RESTRICTIONS No  FALLS:  Has patient fallen in last 6 months? No  LIVING ENVIRONMENT: Lives with: lives alone  OCCUPATION: retired  PLOF: Independent  PATIENT GOALS stop the stool leakage to return to swimming and activities and not have to have surgery again  PERTINENT HISTORY:  Posterior repair, perineoplasty cystoscopy, urethral bulking, 08/30/2021; Breast Cancer 12/2011 right, ER+; HX of radiation therapy 03/12/12-04/13/12; Hypertension; IBS; COPD; Stroke; Kyphoplasty 06/15/2017; Transobturator sling 07/05/2005; Melanoma  BOWEL MOVEMENT Pain with bowel movement: No Type of bowel movement:Type (Bristol Stool Scale) 1, 3,4, 5, Frequency daily , and Strain No Fully empty rectum: Yes: 90% of the time  but if has Type 1 and will go again Leakage: Yes: comes our randomly Pads: Yes: 2-3 per week Fiber supplement: Yes: colace  URINATION Pain with urination: Yes when she does not have enough fluid Fully empty bladder: Yes:   Stream: Strong, she will urinate a second time.  Urgency:  No Frequency: average Leakage:  when waiting too long to go to the bathroom Pads: Yes: 2-3 per week    OBJECTIVE:   DIAGNOSTIC FINDINGS:  none  COGNITION:  Overall cognitive status: Impaired, expressive  communication from stroke on the left side    SENSATION:  Light touch: Appears intact  Proprioception: Appears intact               POSTURE: No Significant postural limitations   PELVIC ALIGNMENT:correct alignment  LUMBARAROM/PROM  A/PROM A/PROM  eval  Flexion Decreased by 25%  Extension Decreased by 25%  Right lateral flexion Decreased by 25%  Left lateral flexion Decreased by 25%  Right rotation Decreased by 25%  Left rotation Decreased by 25%   (Blank rows = not tested)  LOWER EXTREMITY ROM:  Passive ROM Right eval Left eval  Hip external rotation 25 75   (Blank rows = not tested)  LOWER EXTREMITY MMT:  MMT Right eval Left eval  Hip extension 4/5 4/5  Hip abduction 4/5 4/5    PALPATION:   General  tenderness located above the umbilicus;                 External Perineal Exam tightness in the perineal body                             Internal Pelvic Floor tightness in the internal anal sphincter, along the puborectalis  Patient confirms identification and approves PT to assess internal pelvic floor and treatment Yes  PELVIC MMT:   MMT eval  Internal Anal Sphincter 2/5 1 sec  External Anal Sphincter 2/5 1 sec  Puborectalis 2/5 1 sec  (Blank rows = not tested)        TONE: good  PROLAPSE: none  TODAY'S TREATMENT  EVAL Date: 11/03/2021 HEP established-see below    PATIENT EDUCATION:  11/03/2021 Education details: Access Code: [email or web address] Person educated: Patient Education method: Explanation, Demonstration, Tactile cues, Verbal cues, and Handouts Education comprehension: verbalized understanding, returned demonstration, verbal cues required, tactile cues required, and needs further education   HOME EXERCISE PROGRAM: 11/03/2021 Access Code: [email or web address] URL: [email or web address] Date: 11/03/2021 Prepared by: Earlie Counts  Exercises - Sidelying Pelvic Floor Contraction with Self-Palpation  - 3 x daily - 7 x weekly - 1 sets - 5 reps -  3 sec hold  ASSESSMENT:  CLINICAL IMPRESSION: Patient is a 81 y.o. female who was seen today for physical therapy evaluation and treatment for mixed incontinence. Patient had Posterior repair, perineoplasty cystoscopy, urethral bulking on  08/30/2021. Patient has fecal leakage since then. She will wear a pad 2-3 times per week. The stool leakage comes out randomly. Patient stool type is 1, 3, 4, 5. She is able to empty her rectum 90% of the time. When she has type 1 stool she has to go a second time. Rectal strength is 2/5 holding for 1 second. She has trouble with making a circular contraction. She is not able to lift the sphincter muscles and trouble with the puborectalis coming forward during a contraction. Bilateral hip abduction and extension is 4/5. Lumbar ROM is decreased by 25%. Patient report 6/10 pain level in the lower abdomen in the morning. Patient will benefit from skilled therapy to improve rectal strength to reduce her stool leakage.    OBJECTIVE IMPAIRMENTS decreased activity tolerance, decreased coordination, decreased endurance, decreased strength, increased fascial restrictions, and pain.   ACTIVITY LIMITATIONS continence and toileting  PARTICIPATION  LIMITATIONS: community activity  PERSONAL FACTORS Age, Fitness, Time since onset of injury/illness/exacerbation, and 3+ comorbidities:    Posterior repair, perineoplasty cystoscopy, urethral bulking, 08/30/2021; Breast Cancer 12/2011 right, ER+; HX of radiation therapy 03/12/12-04/13/12; Hypertension; IBS; COPD; Stroke; Kyphoplasty 06/15/2017; are also affecting patient's functional outcome.   REHAB POTENTIAL: Excellent  CLINICAL DECISION MAKING: Stable/uncomplicated  EVALUATION COMPLEXITY: Low   GOALS: Goals reviewed with patient? Yes  SHORT TERM GOALS: Target date: 12/01/2021  Patient independent with diaphragmatic breathing to bulge the pelvic floor.  Baseline: Goal status: INITIAL  2.  Patient educated patient on correct  toileting to fully empty her rectum.  Baseline:  Goal status: INITIAL   LONG TERM GOALS: Target date: 01/26/2022   Patient independent with pelvic floor strengthening to reduce her leakage.  Baseline:  Goal status: INITIAL  2.  Rectal strength is 3/5 with good lift of the rectum to reduce stool leakage.  Baseline:  Goal status: INITIAL  3.  Patient reports she is not having to wear a pad due to reduction on stool leakage.  Baseline:  Goal status: INITIAL  4.  Patient reports her lower abdominal pain decreased </= 2/10 due to improved tissue restrictions.  Baseline:  Goal status: INITIAL    PLAN: PT FREQUENCY: 1x/week  PT DURATION: 12 weeks  PLANNED INTERVENTIONS: Therapeutic exercises, Therapeutic activity, Neuromuscular re-education, Patient/Family education, Self Care, Dry Needling, Electrical stimulation, Cryotherapy, Moist heat, Contrast bath, and Manual therapy  PLAN FOR NEXT SESSION: work on the rectum to improve circular contraction and release the restrictions, diaphragmatic breathing   Earlie Counts, PT 11/03/21 9:26 PM

## 2021-11-03 ENCOUNTER — Other Ambulatory Visit: Payer: Self-pay

## 2021-11-03 ENCOUNTER — Encounter: Payer: Self-pay | Admitting: Physical Therapy

## 2021-11-03 ENCOUNTER — Ambulatory Visit: Payer: Medicare Other | Attending: Obstetrics and Gynecology | Admitting: Physical Therapy

## 2021-11-03 DIAGNOSIS — M6281 Muscle weakness (generalized): Secondary | ICD-10-CM | POA: Insufficient documentation

## 2021-11-03 DIAGNOSIS — R278 Other lack of coordination: Secondary | ICD-10-CM | POA: Insufficient documentation

## 2021-11-03 LAB — LIPID PANEL
Chol/HDL Ratio: 2.2 ratio (ref 0.0–4.4)
Cholesterol, Total: 156 mg/dL (ref 100–199)
HDL: 72 mg/dL (ref >39)
LDL Chol Calc (NIH): 66 mg/dL (ref 0–99)
Triglycerides: 102 mg/dL (ref 0–149)
VLDL Cholesterol Cal: 18 mg/dL (ref 5–40)

## 2021-11-03 LAB — COMPREHENSIVE METABOLIC PANEL
ALT: 11 IU/L (ref 0–32)
AST: 15 IU/L (ref 0–40)
Albumin/Globulin Ratio: 2.2 (ref 1.2–2.2)
Albumin: 4.7 g/dL (ref 3.8–4.8)
Alkaline Phosphatase: 65 IU/L (ref 44–121)
BUN/Creatinine Ratio: 25 (ref 12–28)
BUN: 24 mg/dL (ref 8–27)
Bilirubin Total: 0.7 mg/dL (ref 0.0–1.2)
CO2: 24 mmol/L (ref 20–29)
Calcium: 9.8 mg/dL (ref 8.7–10.3)
Chloride: 105 mmol/L (ref 96–106)
Creatinine, Ser: 0.95 mg/dL (ref 0.57–1.00)
Globulin, Total: 2.1 g/dL (ref 1.5–4.5)
Glucose: 87 mg/dL (ref 70–99)
Potassium: 4.3 mmol/L (ref 3.5–5.2)
Sodium: 143 mmol/L (ref 134–144)
Total Protein: 6.8 g/dL (ref 6.0–8.5)
eGFR: 61 mL/min/{1.73_m2} (ref >59)

## 2021-11-04 ENCOUNTER — Telehealth (HOSPITAL_BASED_OUTPATIENT_CLINIC_OR_DEPARTMENT_OTHER): Payer: Self-pay

## 2021-11-04 ENCOUNTER — Ambulatory Visit (INDEPENDENT_AMBULATORY_CARE_PROVIDER_SITE_OTHER): Payer: Medicare Other | Admitting: Family

## 2021-11-04 ENCOUNTER — Encounter (HOSPITAL_BASED_OUTPATIENT_CLINIC_OR_DEPARTMENT_OTHER): Payer: Self-pay | Admitting: Family

## 2021-11-04 VITALS — BP 136/60 | HR 71 | Ht 63.5 in | Wt 139.0 lb

## 2021-11-04 DIAGNOSIS — Z86718 Personal history of other venous thrombosis and embolism: Secondary | ICD-10-CM | POA: Diagnosis not present

## 2021-11-04 DIAGNOSIS — Z8673 Personal history of transient ischemic attack (TIA), and cerebral infarction without residual deficits: Secondary | ICD-10-CM | POA: Diagnosis not present

## 2021-11-04 DIAGNOSIS — E785 Hyperlipidemia, unspecified: Secondary | ICD-10-CM

## 2021-11-04 DIAGNOSIS — E876 Hypokalemia: Secondary | ICD-10-CM

## 2021-11-04 DIAGNOSIS — I25118 Atherosclerotic heart disease of native coronary artery with other forms of angina pectoris: Secondary | ICD-10-CM

## 2021-11-04 MED ORDER — NITROGLYCERIN 0.4 MG SL SUBL
0.4000 mg | SUBLINGUAL_TABLET | SUBLINGUAL | 3 refills | Status: DC | PRN
Start: 2021-11-04 — End: 2024-01-16

## 2021-11-04 MED ORDER — POTASSIUM CHLORIDE ER 10 MEQ PO TBCR
20.0000 meq | EXTENDED_RELEASE_TABLET | Freq: Two times a day (BID) | ORAL | 2 refills | Status: DC
Start: 2021-11-04 — End: 2022-12-20

## 2021-11-04 MED ORDER — SIMVASTATIN 20 MG PO TABS: 10.0000 mg | ORAL_TABLET | ORAL | 3 refills | Status: DC

## 2021-11-04 NOTE — Progress Notes (Signed)
Office Visit    Patient Name: Alexandria [name] Date of Encounter: 11/04/2021  PCP:  [name], NP   [place]  Cardiologist:  Skeet Latch, MD  Advanced Practice Provider:  No care team member to display Electrophysiologist:  None      Chief Complaint    Alexandria [name] is a 81 y.o. female with a hx of CAD, DVT, anemia, arthritis, anxiety, asthma, acute brainstem infarct, right breast cancer s/p radiation therapy, COPD, DJD, depression, atrial fibrillation, GERD, hearing loss, glomerulonephritis, hyperlipidemia, hypertension, IBS, pneumonia, PVD (varicose veins), spinal stenosis, left traumatic hemopneumothorax presents today for follow-up of hyperlipidemia  Past Medical History    Past Medical History:  Diagnosis Date   Allergy    Anemia    hx of    Anxiety    Arthritis    left knee, hands, back   Asthma    daily and prn inhalers   Atypical chest pain 11/26/2020   Brainstem infarct, acute ([place]) 03/2011   slight expressive aphasia, occ. 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 since age 82   IBS (irritable bowel syndrome)    Long term current use of aromatase inhibitor 05/08/2012   Overactive bladder    Palpitations    Pneumonia    hx of x 2    PONV (postoperative nausea and vomiting)    PVD (peripheral vascular disease) ([place])    varicose veins - left worse than right   Spinal stenosis    Stress incontinence    Stroke [facility])    problem with  expressive communicaiton   Traumatic hemopneumothorax 1982   left   Past Surgical History:  Procedure Laterality Date   ANKLE HARDWARE REMOVAL     right   BREAST BIOPSY  1976   right   BREAST BIOPSY Left 2019   benign lymph node   BREAST LUMPECTOMY  2013   right with snbx   CHOLECYSTECTOMY  1990s   COLONOSCOPY     CYSTOSCOPY  07/05/2005   KNEE SURGERY  1980s   left   KYPHOPLASTY N/A 06/15/2017   Procedure: KYPHOPLASTY T11;  Surgeon: Melina Schools, MD;  Location: [place];  Service: Orthopedics;  Laterality: N/A;  90 mins   ORIF ANKLE FRACTURE  1980s   right    TOTAL KNEE ARTHROPLASTY Left 12/08/2015   Procedure: LEFT TOTAL KNEE ARTHROPLASTY;  Surgeon: Paralee Cancel, MD;  Location: [facility];  Service: Orthopedics;  Laterality: Left;   TRANSOBTURATOR SLING  07/05/2005   TUBAL LIGATION  1976   VEIN SURGERY     vein ablation left leg    Allergies  Allergies  Allergen Reactions   Augmentin [Amoxicillin-Pot Clavulanate] Itching, Rash and Other (See Comments)    PT DEVELOPED SEVERE RASH INVOLVING MUCUS MEMBRANES or SKIN NECROSIS WITH PENICILLINS: #  #  #  YES  #  #  #  Penicillins Hives and Other (See Comments)    Has patient had a PCN reaction causing immediate rash, facial/tongue/throat swelling, SOB or lightheadedness with hypotension: No HAS PT DEVELOPED SEVERE RASH INVOLVING MUCUS MEMBRANES or SKIN NECROSIS: #  #  #  YES  #  #  #   Has patient had a PCN reaction that required hospitalization: No Has patient had a PCN reaction occurring within the last 10 years: No    Aspirin Hives   Chlorhexidine Gluconate Rash   Chocolate Hives and Other (See Comments)    RUNNY NOSE    Coffee Bean Extract Hives and Other (See Comments)    RUNNY NOSE    Diclofenac Other (See Comments)   Ibuprofen Hives and Other (See Comments)    RUNNY NOSE   Oxycodone Hcl Hives and Nausea And Vomiting   Shrimp [Shellfish Allergy] Hives and Other (See Comments)    RUNNY NOSE    Sulfonamide Derivatives  Hives   Lipitor [Atorvastatin Calcium]     Leg Pain/Joint Pain/Back Pain    History of Present Illness    Alexandria [name] is a 81 y.o. female with a hx of CAD, DVT, anemia, arthritis, anxiety, asthma, acute brainstem infarct, right breast cancer s/p radiation therapy, COPD, DJD, depression, atrial fibrillation, GERD, hearing loss, glomerulonephritis, hyperlipidemia, hypertension, IBS, pneumonia, PVD (varicose veins), spinal stenosis, left traumatic hemopneumothorax last seen 11/26/2020 by Dr. Oval Linsey.  Prior stress test 2010 with EF 82%, no ischemia nor infarct. Previously evaluated in 2014 by Dr. Percival Spanish after diagnosis of stroke -due to palpitations atenolol was transitioned to Toprol.  Echocardiogram was recommended and performed 05/2012 revealing EF 60%, no RWMA, no cardiac source of emboli.  Myoview 2014 with no evidence of ischemia nor infarct.  Evaluated 11/26/2020 after referral from PCP for evaluation of hypertension.  Noted prior to a scheduled posterior vaginal wall repair 05/31/2020 she had mechanical fall.  Twisted right ankle and fell flat in her driveway.  Subsequently developed 3 small thrombi above the ventricles in her posterior head.  Another fall where she fell backwards on concrete and fractured T11 vertebrae.  Then tore her left rotator cuff.  Posterior vaginal wall repair was never able to be completed due to these medical issues but she was inclined to have that surgery.  She admitted chest pain which she attributed to GERD.  However also with exertional component and was recommended for ischemic evaluation. She did not previous muscle cramps with Atorvastatin. Cardiac CTA 11/2020 coronary calcium score of 71 placing her in the 47th percentile for age and sex matched control (RCA 25-49%, proximal LAD 0-24%, proximal large first diagonal 25-49%).  FFR with no significant stenosis.   At clinic visit 07/05/2021 her simvastatin was reduced to twice per week due to myalgias.  She was  started on Repatha.  Repeat lipid panel 11/02/2020 AST 15, ALT 11, LDL 66.  She presents today for follow-up.  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  She tells me her lower extremity edema is unchanged though manageable with compression stockings.  Notes still with occasional word finding difficulties due to her history of stroke.  Reports no shortness of breath nor dyspnea on exertion. Reports no chest pain, pressure, or tightness. No  orthopnea, PND. Reports no palpitations.  Still with myalgias on simvastatin.  Planning to start swimming at [facility] for exercise.  EKGs/Labs/Other Studies Reviewed:   The following studies were reviewed today:  Cardiac CTA 12/04/2018  Aorta:  Normal size.  Mild calcifications.  No dissection.   Aortic Valve: No calcifications.   Coronary Arteries:  Normal coronary origin.  Right dominance.   RCA is a large dominant artery that gives rise to PDA and PLA. There is proximal calcified plaque, 25-49% stenosis.   Left main is a large artery that gives rise to LAD and LCX arteries.   LAD is a large vessel that has proximal calcified plaque, 0-24% stenosis, proximal large first diagonal calcified plaque, 25-49% stenosis.   LCX is a non-dominant artery that gives rise to one large OM1 branch. There is no plaque.   Other findings:   Normal pulmonary vein drainage into the left atrium.   Normal left atrial appendage without a thrombus.   Normal size of the pulmonary artery.   Please see radiology report for non cardiac findings.   IMPRESSION: 1. Coronary calcium score of 71 (LAD 50, RCA 21). This was 44 percentile for age and sex matched control.   2. Normal coronary origin with right dominance.   3. Moderate CAD in LAD and RCA distribution. Will send for FFR analysis.   4.  Aortic atherosclerosis.   CAD-RADS 2. Mild non-obstructive CAD (25-49%). Consider non-atherosclerotic causes of chest pain. Consider  preventive therapy and risk factor modification.  FFR 1. Left Main: No significant stenosis.   2. LAD: No significant stenosis. 3. LCX: No significant stenosis. 4. RCA: No significant stenosis.   IMPRESSION: 1.  CT FFR analysis didn't show any significant stenosis.   LE Venous Reflux 03/04/2020: Summary:  Right:  - No evidence of deep vein thrombosis from the common femoral through the  popliteal veins.  - No evidence of superficial vein thrombosis.  - Popliteal cyst observed.  - The common femoral vein is incompetent.  - The great and small saphenous veins were not visualized, consistent with  history of ablation.     Left:  - No evidence of deep vein thrombosis from the common femoral through the  popliteal veins.  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  - No true great saphenous vein identified.  - The small saphenous vein is not competent.    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  - Findings consistent with age indeterminate superficial vein thrombosis  involving the right superficial veins/varciosities.  - There is no evidence of deep vein thrombosis in the lower extremity.     - No cystic structure found in the popliteal fossa.   EKG: No EKG today  Recent Labs: 02/16/2021: Hemoglobin 12.9; Platelets 241.0; TSH 2.24 11/02/2021: ALT 11; BUN 24; Creatinine, Ser 0.95; Potassium 4.3; Sodium 143  Recent Lipid Panel    Component Value Date/Time   CHOL 156 11/02/2021 0826   TRIG 102 11/02/2021 0826   HDL 72 11/02/2021 0826   CHOLHDL 2.2 11/02/2021 0826   CHOLHDL 5 07/10/2019 1441   VLDL 36.6 07/10/2019 1441   LDLCALC 66 11/02/2021 0826   LDLDIRECT 183.5 12/17/2008 0953    Home Medications   Current Meds  Medication Sig   albuterol (PROAIR HFA) 108 (90 Base) MCG/ACT inhaler Inhale 2 puffs into the  lungs every 6 (six) hours as needed for wheezing or shortness of breath.   amLODipine (NORVASC) 10 MG tablet TAKE 1 TABLET EACH DAY.   ARNICA EX Apply 1 application topically daily as needed (pain).   b  complex vitamins tablet Take 1 tablet by mouth 2 (two) times daily. PLUS FOLIC ACID PLUS VIT. C   Biotin 5000 MCG CAPS Take 5,000 mcg by mouth daily.    Capsaicin 0.025 % PADS Apply 1 application topically daily as needed (pain).   Cholecalciferol (VITAMIN D-3) 5000 UNITS TABS Take 5,000 Units by mouth daily.    clopidogrel (PLAVIX) 75 MG tablet TAKE 1 TABLET BY MOUTH DAILY.   CO-ENZYME Q10-VITAMIN E PO Take 1 tablet by mouth daily.   Diclofenac Sodium (PENNSAID) 2 % SOLN Apply 1 application topically 3 (three) times daily. (Patient taking differently: Apply 1 application  topically as needed.)   diphenhydrAMINE (BENADRYL) 25 MG tablet Take 25 mg by mouth every 6 (six) hours as needed. Patient taking 1/2 (12.5 mg) as needed for allergies and itching   escitalopram (LEXAPRO) 10 MG tablet TAKE ONE TABLET BY MOUTH ONCE DAILY   Evolocumab (REPATHA SURECLICK) 176 MG/ML SOAJ Inject 140 mg into the skin every 14 (fourteen) days.   fluticasone (FLONASE) 50 MCG/ACT nasal spray USE 1 SPRAY IN EACH NOSTRIL DAILY.   fluticasone-salmeterol (ADVAIR HFA) 230-21 MCG/ACT inhaler Inhale 2 puffs into the lungs 2 (two) times daily.   hydrochlorothiazide (MICROZIDE) 12.5 MG capsule TAKE (1) CAPSULE TWICE DAILY.   ipratropium (ATROVENT) 0.03 % nasal spray Place 2 sprays into both nostrils every 12 (twelve) hours.   loratadine (CLARITIN) 10 MG tablet Take 10 mg by mouth daily as needed for allergies.   Misc Natural Products (OSTEO BI-FLEX ADV TRIPLE ST PO) Take 1 tablet by mouth daily.   montelukast (SINGULAIR) 10 MG tablet Take 1 tablet (10 mg total) by mouth at bedtime.   ondansetron (ZOFRAN-ODT) 4 MG disintegrating tablet DISSOLVE 1 TABLET ON TONGUE EVERY 8 HOURS AS NEEDED FOR NAUSEA/VOMITING.   pantoprazole  (PROTONIX) 40 MG tablet TAKE ONE TABLET BY MOUTH DAILY   Polyethyl Glycol-Propyl Glycol (SYSTANE OP) Place 1 drop into both eyes daily as needed. Dry eyes   potassium chloride SA (KLOR-CON M) 20 MEQ tablet TAKE 3 TABLETS DAILY.   Probiotic Product (ALIGN PO) Take 1 tablet by mouth daily.   Selenium 200 MCG CAPS Take 200 mcg by mouth 2 (two) times daily.    simvastatin (ZOCOR) 20 MG tablet Take 1 tablet (20 mg total) by mouth 2 (two) times a week.   Trolamine Salicylate (ASPERCREME EX) Apply 1 application topically daily as needed (pain).     Review of Systems      All other systems reviewed and are otherwise negative except as noted above.  Physical Exam    VS:  BP 136/60   Pulse 71   Ht 5' 3.5" (1.613 m)   Wt 139 lb (63 kg)   SpO2 97%   BMI 24.24 kg/m  , BMI Body mass index is 24.24 kg/m.  Wt Readings from Last 3 Encounters:  11/04/21 139 lb (63 kg)  07/05/21 142 lb 1.6 oz (64.5 kg)  02/16/21 142 lb (64.4 kg)    GEN: Well nourished, well developed, in no acute distress. HEENT: normal. Neck: Supple, no JVD, carotid bruits, or masses. Cardiac: RRR, no murmurs, rubs, or gallops. No clubbing, cyanosis, edema.  Radials/PT 2+ and equal bilaterally.  Respiratory:  Respirations regular and unlabored, clear to auscultation bilaterally. GI: Soft, nontender, nondistended. MS: No deformity or atrophy. Skin: Warm and dry, no rash. Neuro:  Strength and sensation are intact. Psych: Normal affect.  Assessment & Plan   CAD - Moderate nonobstructive disease by cardiac CTA  11/2020 with no evidence of significant stenosis by FFR. Stable with no anginal symptoms. No indication for ischemic evaluation.  GDMT includes Plavix, simvastatin, Repatha. Rx PRN nitroglycerin.  Heart healthy diet and regular cardiovascular exercise encouraged.    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 11/2020 LE duplex no DVT.  No indication for anticoagulation.  HLD, LDL goal <70 - Did not  tolerate Atorvastatin with myalgias. Did not tolerate Simvasatin at daily dose. Still myalgia with '20mg'$  dose twice per week - will reduce to '10mg'$  twice per week.  Repatha initiated at last visit with repeat labs 11/02/21 LDL 66.   HTN - BP well controlled. Continue current antihypertensive regimen.    Hx of CVA - Continue Aspirin and statin.  Rectocele -Follows with Dr. Reva Bores. Had surgery 09/10/21 with good results. Participating in pelvic PT.  Disposition: Follow up in 6 month(s) with Skeet Latch, MD or APP.  Signed, Loel Dubonnet, NP 11/04/2021, 4:02 PM [facility]

## 2021-11-04 NOTE — Telephone Encounter (Addendum)
Results called to patient who verbalizes understanding!      ----- Message from Loel Dubonnet, NP sent at 11/03/2021 10:04 AM EDT ----- Cholesterol panel looks fantastic with all numbers at goal since addition of Repatha.  Normal kidney, liver, electrolytes.  Continue current medications.

## 2021-11-04 NOTE — Patient Instructions (Addendum)
Medication Instructions:  Your physician has recommended you make the following change in your medication:   REDUCE Simvastatin to half tablet ('10mg'$ ) twice per week  START Nitroglycerin as needed for chest pain  For as needed Nitroglycerin, if you develop chest pain: Sit and rest 5 minutes. If chest pain does not resolve place 1 nitroglycerin under your tongue and wait 5 minutes. If chest pain does not resolve, place a 2nd nitroglycerin under your tongue and wait 5 more minutes. If chest pain does not resolve, place a 3rd nitroglycerin under your tongue and seek emergency services.    CHANGE Potassium to 2 tablets (20 mEq) twice per day. This gest you to the same dose of 52mq daily.  *STOP the 272m tablets   *If you need a refill on your cardiac medications before your next appointment, please call your pharmacy*  Lab Work: None ordered today.  Your recent cholesterol numbers looked fantastic!  Testing/Procedures: None ordered today.   Follow-Up: [facility].  As part of our continuing mission to provide you with exceptional heart care, we have created designated Provider Care Teams.  These Care Teams include your primary Cardiologist (physician) and Advanced Practice Providers (APPs -  Physician Assistants and Nurse Practitioners) who all work together to provide you with the care you need, when you need it.  We recommend signing up for the patient portal called "MyChart".  Sign up information is provided on this After Visit Summary.  MyChart is used to connect with patients for Virtual Visits (Telemedicine).  Patients are able to view lab/test results, encounter notes, upcoming appointments, etc.  Non-urgent messages can be sent to your provider as well.   To learn more about what you can do with MyChart, go to [email or web address]   Your next appointment:   6 month(s)  The format for your next appointment:   In  Person  Provider:   TiSkeet LatchMD or CaLaurann MontanaNP   Other Instructions   Heart Healthy Diet Recommendations: A low-salt diet is recommended. Meats should be grilled, baked, or boiled. Avoid fried foods. Focus on lean protein sources like fish or chicken with vegetables and fruits. The American Heart Association is a GRMicrobiologist American Heart Association Diet and Lifeystyle Recommendations   Exercise recommendations: The American Heart Association recommends 150 minutes of moderate intensity exercise weekly. Try 30 minutes of moderate intensity exercise 4-5 times per week. This could include walking, jogging, or swimming.   Important Information About Sugar

## 2021-11-16 ENCOUNTER — Telehealth: Payer: Self-pay | Admitting: Adult Health

## 2021-11-16 NOTE — Telephone Encounter (Signed)
Left message for patient to call back and schedule Medicare Annual Wellness Visit (AWV) either virtually or in office. Left  my Herbie Drape number [phone]   Last AWV ;08/30/11  please schedule at anytime with [facility] Nurse Health Advisor 1 or 2

## 2021-11-17 NOTE — Telephone Encounter (Signed)
Patient returned my call.  She stated she would rather do the AWV with her insurance company.  They give her a nice little gift.  She also stated she has a lot going on right now

## 2021-12-03 ENCOUNTER — Ambulatory Visit (INDEPENDENT_AMBULATORY_CARE_PROVIDER_SITE_OTHER): Payer: Medicare Other | Admitting: Adult Health

## 2021-12-03 ENCOUNTER — Encounter: Payer: Self-pay | Admitting: Adult Health

## 2021-12-03 VITALS — BP 130/68 | HR 72 | Temp 98.1°F | Ht 63.5 in | Wt 139.0 lb

## 2021-12-03 DIAGNOSIS — S5011XA Contusion of right forearm, initial encounter: Secondary | ICD-10-CM

## 2021-12-03 DIAGNOSIS — T148XXA Other injury of unspecified body region, initial encounter: Secondary | ICD-10-CM | POA: Diagnosis not present

## 2021-12-03 DIAGNOSIS — S5012XA Contusion of left forearm, initial encounter: Secondary | ICD-10-CM

## 2021-12-03 LAB — CBC WITH DIFFERENTIAL/PLATELET
Basophils Absolute: 0.1 10*3/uL (ref 0.0–0.1)
Basophils Relative: 1.3 % (ref 0.0–3.0)
Eosinophils Absolute: 0.1 10*3/uL (ref 0.0–0.7)
Eosinophils Relative: 1.7 % (ref 0.0–5.0)
HCT: 35.5 % — ABNORMAL LOW (ref 36.0–46.0)
Hemoglobin: 12.1 g/dL (ref 12.0–15.0)
Lymphocytes Relative: 36 % (ref 12.0–46.0)
Lymphs Abs: 2 10*3/uL (ref 0.7–4.0)
MCHC: 34 g/dL (ref 30.0–36.0)
MCV: 87.6 fl (ref 78.0–100.0)
Monocytes Absolute: 0.5 10*3/uL (ref 0.1–1.0)
Monocytes Relative: 8.2 % (ref 3.0–12.0)
Neutro Abs: 2.9 10*3/uL (ref 1.4–7.7)
Neutrophils Relative %: 52.8 % (ref 43.0–77.0)
Platelets: 213 10*3/uL (ref 150.0–400.0)
RBC: 4.05 Mil/uL (ref 3.87–5.11)
RDW: 13.4 % (ref 11.5–15.5)
WBC: 5.5 10*3/uL (ref 4.0–10.5)

## 2021-12-03 LAB — IBC + FERRITIN
Ferritin: 8.8 ng/mL — ABNORMAL LOW (ref 10.0–291.0)
Iron: 63 ug/dL (ref 42–145)
Saturation Ratios: 16.6 % — ABNORMAL LOW (ref 20.0–50.0)
TIBC: 379.4 ug/dL (ref 250.0–450.0)
Transferrin: 271 mg/dL (ref 212.0–360.0)

## 2021-12-03 LAB — APTT: aPTT: 31.3 s (ref 25.4–36.8)

## 2021-12-03 NOTE — Progress Notes (Signed)
Subjective:    Patient ID: Alexandria [name], female    DOB: Aug 04, 1940, 81 y.o.   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  She presents to the office today for concern of worsening bleeding and bruising. She reports that she has noticed that she " barely has to do anything and I get a bruise". She has bruises on her forearms from leaning against the kitchen counter and bruising on her fingers from carrying grocery bags. She is on Plavix   She is not taking Vitamin E, Ginger, or Ginko Supplements.    Review of Systems See HPI   Past Medical History:  Diagnosis Date   Allergy    Anemia    hx of    Anxiety    Arthritis    left knee, hands, back   Asthma    daily and prn inhalers   Atypical chest pain 11/26/2020   Brainstem infarct, 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 problems with balance   Breast cancer (HCC) 12/2011   right, ER+, PR -, Her 2 -   COPD (chronic obstructive pulmonary disease) (HCC)    Degenerative joint disease of low back    Depression    Dysrhythmia    atrial fib    GERD (gastroesophageal reflux disease)    Hearing loss    bilateral hearing aids   History of  glomerulonephritis as a child   and had abscess left kidney   History of kidney stones    Hx of radiation therapy 03/12/12 -04/13/12   right breast   Hyperlipidemia    Hypertension    under control, has been on med. since age 73   IBS (irritable bowel syndrome)    Long term current use of aromatase inhibitor 05/08/2012   Overactive bladder    Palpitations    Pneumonia    hx of x 2    PONV (postoperative nausea and vomiting)    PVD (peripheral vascular disease) (HCC)    varicose veins - left worse than right   Spinal stenosis    Stress incontinence    Stroke [facility])    problem with expressive communicaiton   Traumatic hemopneumothorax 1982   left    Social History   Socioeconomic History   Marital status: Widowed    Spouse name: Not on file   Number of children: Not on file   Years of education: Not on file   Highest education level: Not on file  Occupational History   Not on file  Tobacco Use   Smoking status: Former    Packs/day: 0.50    Years: 15.00    Total pack years: 7.50    Types: Cigarettes    Quit date: 08/28/1993    Years since quitting: 28.2   Smokeless tobacco: Never  Vaping Use   Vaping Use: Never used  Substance and Sexual Activity   Alcohol use: No  Alcohol/week: 0.0 standard drinks of alcohol   Drug use: No   Sexual activity: Not Currently  Other Topics Concern   Not on file  Social History Narrative   Widowed since 2014   Retired Scientist, product/process development    Son and daughter   Social Determinants of Health   Financial Resource Strain: [place]  (11/26/2020)   Overall Financial Resource Strain (CARDIA)    Difficulty of Paying Living Expenses: Not hard at all  Food Insecurity: No Food Insecurity (11/26/2020)   Hunger Vital Sign    Worried About Running Out of Food in the Last Year: Never true    Ran Out of Food in the Last Year: Never true  Transportation Needs: No Transportation Needs (11/26/2020)   PRAPARE - Hydrologist  (Medical): No    Lack of Transportation (Non-Medical): No  Physical Activity: Insufficiently Active (11/26/2020)   Exercise Vital Sign    Days of Exercise per Week: 3 days    Minutes of Exercise per Session: 30 min  Stress: Not on file  Social Connections: Not on file  Intimate Partner Violence: Not on file    Past Surgical History:  Procedure Laterality Date   ANKLE HARDWARE REMOVAL     right   BREAST BIOPSY  1976   right   BREAST BIOPSY Left 2019   benign lymph node   BREAST LUMPECTOMY  2013   right with snbx   [place]  07/05/2005   KNEE SURGERY  1980s   left   KYPHOPLASTY N/A 06/15/2017   Procedure: KYPHOPLASTY T11;  Surgeon: Melina Schools, MD;  Location: [place];  Service: Orthopedics;  Laterality: N/A;  90 mins   ORIF ANKLE FRACTURE  1980s   right    TOTAL KNEE ARTHROPLASTY Left 12/08/2015   Procedure: LEFT TOTAL KNEE ARTHROPLASTY;  Surgeon: Paralee Cancel, MD;  Location: [facility];  Service: Orthopedics;  Laterality: Left;   TRANSOBTURATOR SLING  07/05/2005   TUBAL LIGATION  1976   VEIN SURGERY     vein ablation left leg    Family History  Problem Relation Age of Onset   Stroke Mother    Heart attack Father        Died age 30   Cancer Maternal Aunt        breast   Cancer Paternal Aunt        Multiple Myeloma   Breast cancer Paternal Aunt        late 50s-60s   Breast cancer Paternal Aunt        bilateral breast cancer   Dementia Paternal Aunt    Stomach cancer Paternal Uncle    Breast cancer Paternal Uncle    Congestive Heart Failure Paternal Uncle        Ischemic   Congestive Heart Failure Paternal Uncle        Ischemic   Heart attack Paternal Grandfather    Breast cancer Cousin        early 31s   Breast cancer Daughter     Allergies  Allergen Reactions   Augmentin [Amoxicillin-Pot Clavulanate] Itching, Rash and Other (See Comments)    PT DEVELOPED SEVERE RASH INVOLVING MUCUS MEMBRANES or SKIN NECROSIS WITH  PENICILLINS: #  #  #  YES  #  #  #     Penicillins Hives and Other (See Comments)    Has patient had a PCN reaction causing immediate rash,  facial/tongue/throat swelling, SOB or lightheadedness with hypotension: No HAS PT DEVELOPED SEVERE RASH INVOLVING MUCUS MEMBRANES or SKIN NECROSIS: #  #  #  YES  #  #  #   Has patient had a PCN reaction that required hospitalization: No Has patient had a PCN reaction occurring within the last 10 years: No    Aspirin Hives   Chlorhexidine Gluconate Rash   Chocolate Hives and Other (See Comments)    RUNNY NOSE    Coffee Bean Extract Hives and Other (See Comments)    RUNNY NOSE    Diclofenac Other (See Comments)   Ibuprofen Hives and Other (See Comments)    RUNNY NOSE   Oxycodone Hcl Hives and Nausea And Vomiting   Shrimp [Shellfish Allergy] Hives and Other (See Comments)    RUNNY NOSE    Sulfonamide Derivatives Hives   Lipitor [Atorvastatin Calcium]     Leg Pain/Joint Pain/Back Pain   Methocarbamol Other (See Comments)    Current Outpatient Medications on File Prior to Visit  Medication Sig Dispense Refill   albuterol (PROAIR HFA) 108 (90 Base) MCG/ACT inhaler Inhale 2 puffs into the lungs every 6 (six) hours as needed for wheezing or shortness of breath. 8 g 5   amLODipine (NORVASC) 10 MG tablet TAKE 1 TABLET EACH DAY. 90 tablet 1   ARNICA EX Apply 1 application topically daily as needed (pain).     b complex vitamins tablet Take 1 tablet by mouth 2 (two) times daily. PLUS FOLIC ACID PLUS VIT. C     Biotin 5000 MCG CAPS Take 5,000 mcg by mouth daily.      Capsaicin 0.025 % PADS Apply 1 application topically daily as needed (pain).     Cholecalciferol (VITAMIN D-3) 5000 UNITS TABS Take 5,000 Units by mouth daily.      clopidogrel (PLAVIX) 75 MG tablet TAKE 1 TABLET BY MOUTH DAILY. 90 tablet 3   CO-ENZYME Q10-VITAMIN E PO Take 1 tablet by mouth daily.     Diclofenac Sodium (PENNSAID) 2 % SOLN Apply 1 application topically 3 (three) times  daily. (Patient taking differently: Apply 1 application  topically as needed.) 112 g 0   diphenhydrAMINE (BENADRYL) 25 MG tablet Take 25 mg by mouth every 6 (six) hours as needed. Patient taking 1/2 (12.5 mg) as needed for allergies and itching     escitalopram (LEXAPRO) 10 MG tablet TAKE ONE TABLET BY MOUTH ONCE DAILY 90 tablet 1   Evolocumab (REPATHA SURECLICK) 027 MG/ML SOAJ Inject 140 mg into the skin every 14 (fourteen) days. 2 mL 11   fluticasone (FLONASE) 50 MCG/ACT nasal spray USE 1 SPRAY IN EACH NOSTRIL DAILY. 16 g 5   fluticasone-salmeterol (ADVAIR HFA) 230-21 MCG/ACT inhaler Inhale 2 puffs into the lungs 2 (two) times daily. 1 each 6   hydrochlorothiazide (MICROZIDE) 12.5 MG capsule TAKE (1) CAPSULE TWICE DAILY. 180 capsule 3   ipratropium (ATROVENT) 0.03 % nasal spray Place 2 sprays into both nostrils every 12 (twelve) hours. 30 mL 0   loratadine (CLARITIN) 10 MG tablet Take 10 mg by mouth daily as needed for allergies.     Misc Natural Products (OSTEO BI-FLEX ADV TRIPLE ST PO) Take 1 tablet by mouth daily.     montelukast (SINGULAIR) 10 MG tablet Take 1 tablet (10 mg total) by mouth at bedtime. 30 tablet 3   nitroGLYCERIN (NITROSTAT) 0.4 MG SL tablet Place 1 tablet (0.4 mg total) under the tongue every 5 (five) minutes as needed for chest pain.  25 tablet 3   ondansetron (ZOFRAN-ODT) 4 MG disintegrating tablet DISSOLVE 1 TABLET ON TONGUE EVERY 8 HOURS AS NEEDED FOR NAUSEA/VOMITING. 20 tablet 1   pantoprazole (PROTONIX) 40 MG tablet TAKE ONE TABLET BY MOUTH DAILY 90 tablet 3   Polyethyl Glycol-Propyl Glycol (SYSTANE OP) Place 1 drop into both eyes daily as needed. Dry eyes     potassium chloride (KLOR-CON) 10 MEQ tablet Take 2 tablets (20 mEq total) by mouth 2 (two) times daily. 360 tablet 2   Probiotic Product (ALIGN PO) Take 1 tablet by mouth daily.     Selenium 200 MCG CAPS Take 200 mcg by mouth 2 (two) times daily.      simvastatin (ZOCOR) 20 MG tablet Take 0.5 tablets (10 mg total)  by mouth 2 (two) times a week. 13 tablet 3   Trolamine Salicylate (ASPERCREME EX) Apply 1 application topically daily as needed (pain).     No current facility-administered medications on file prior to visit.    BP 130/68   Pulse 72   Temp 98.1 F (36.7 C) (Oral)   Ht 5' 3.5" (1.613 m)   Wt 139 lb (63 kg)   SpO2 98%   BMI 24.24 kg/m       Objective:   Physical Exam Vitals and nursing note reviewed.  Constitutional:      Appearance: Normal appearance.  Cardiovascular:     Rate and Rhythm: Normal rate and regular rhythm.     Pulses: Normal pulses.     Heart sounds: Normal heart sounds.  Pulmonary:     Effort: Pulmonary effort is normal.     Breath sounds: Normal breath sounds.  Skin:    General: Skin is warm and dry.     Capillary Refill: Capillary refill takes less than 2 seconds.     Findings: Bruising present.     Comments: Bruising on both forearms and left leg   Neurological:     General: No focal deficit present.     Mental Status: She is alert and oriented to person, place, and time.  Psychiatric:        Mood and Affect: Mood normal.        Behavior: Behavior normal.        Thought Content: Thought content normal.        Judgment: Judgment normal.        Assessment & Plan:  1. Bruising - advised that increased bruising is likely due to thinning of the skin. She would still like to check blood work  - CBC with Differential/Platelet; Future - IBC + Ferritin; Future - APTT; Future  Time spent with patient today was 32 minutes which consisted of chart review, discussing bleeding disorders, work up, treatment , listening, answering questions and documentation.

## 2021-12-08 ENCOUNTER — Ambulatory Visit: Payer: Medicare Other | Attending: Obstetrics and Gynecology | Admitting: Physical Therapy

## 2021-12-08 ENCOUNTER — Encounter: Payer: Self-pay | Admitting: Physical Therapy

## 2021-12-08 DIAGNOSIS — R278 Other lack of coordination: Secondary | ICD-10-CM | POA: Diagnosis present

## 2021-12-08 DIAGNOSIS — M6281 Muscle weakness (generalized): Secondary | ICD-10-CM | POA: Insufficient documentation

## 2021-12-08 NOTE — Therapy (Signed)
OUTPATIENT PHYSICAL THERAPY TREATMENT NOTE   Patient Name: Alexandria [name] MRN: [id number] DOB:[birth year], 81 y.o., female Today's Date: 12/08/2021  PCP: [name], NP REFERRING PROVIDER: Damian Leavell, MD  END OF SESSION:   PT End of Session - 12/08/21 1401     Visit Number 2    Date for PT Re-Evaluation 01/26/22    Authorization Type [employer] medicare    Authorization - Visit Number 2    Authorization - Number of Visits 10    PT Start Time 1400    PT Stop Time 1440    PT Time Calculation (min) 40 min    Activity Tolerance Patient tolerated treatment well    Behavior During Therapy WFL for tasks assessed/performed             Past Medical History:  Diagnosis Date   Allergy    Anemia    hx of    Anxiety    Arthritis    left knee, hands, back   Asthma    daily and prn inhalers   Atypical chest pain 11/26/2020   Brainstem infarct, acute ([place]) 03/2011   slight expressive aphasia, occ. 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 since age 62   IBS (irritable bowel syndrome)    Long term current use of aromatase inhibitor 05/08/2012   Overactive bladder    Palpitations    Pneumonia    hx of x 2    PONV (postoperative nausea and vomiting)    PVD (peripheral vascular disease) (HCC)    varicose veins - left worse than right   Spinal stenosis    Stress incontinence    Stroke [facility])    problem with expressive communicaiton   Traumatic hemopneumothorax 1982   left   Past  Surgical History:  Procedure Laterality Date   ANKLE HARDWARE REMOVAL     right   BREAST BIOPSY  1976   right   BREAST BIOPSY Left 2019   benign lymph node   BREAST LUMPECTOMY  2013   right with snbx   CHOLECYSTECTOMY  1990s   COLONOSCOPY     CYSTOSCOPY  07/05/2005   KNEE SURGERY  1980s   left   KYPHOPLASTY N/A 06/15/2017   Procedure: KYPHOPLASTY T11;  Surgeon: Melina Schools, MD;  Location: [place];  Service: Orthopedics;  Laterality: N/A;  90 mins   ORIF ANKLE FRACTURE  1980s   right    TOTAL KNEE ARTHROPLASTY Left 12/08/2015   Procedure: LEFT TOTAL KNEE ARTHROPLASTY;  Surgeon: Paralee Cancel, MD;  [facility];  Service: Orthopedics;  Laterality: Left;   TRANSOBTURATOR SLING  07/05/2005   TUBAL LIGATION  1976   VEIN SURGERY     vein ablation left leg   Patient Active Problem List   Diagnosis Date Noted   Atypical chest pain 11/26/2020   S/P kyphoplasty 06/15/2017  S/P total knee replacement using cement 12/08/2015   Atrial fibrillation ([place]) 11/19/2015   Anxiety and depression 07/09/2015   Varicose veins of bilateral lower extremities with other complications 30/16/0109   Essential hypertension, benign 12/08/2014   Acute hemorrhagic cystitis 02/18/2014   Stroke ([place]) 05/15/2012   Spinal stenosis    COPD (chronic obstructive pulmonary disease) ([place])    Long term current use of aromatase inhibitor 05/08/2012   Hx of radiation therapy    Breast cancer of upper-inner quadrant of right female breast ([place]) 12/23/2011   Ataxia 06/23/2011   Contact dermatitis and eczema due to plant 01/04/2011   BACK PAIN 08/11/2009   PALPITATIONS, RECURRENT 02/27/2009   PERS HX TOBACCO USE PRESENTING HAZARDS HEALTH 12/17/2008   Hyperlipidemia 02/26/2008   ASTHMA 02/26/2008   GERD 02/12/2007   REFERRING DIAG: N39.46 (ICD-10-CM) - Mixed incontinence   THERAPY DIAG:  Muscle weakness (generalized)   Other lack of coordination   Rationale for Evaluation and Treatment Rehabilitation    ONSET DATE: 08/30/2021   SUBJECTIVE:                                                                                                                                                                                            SUBJECTIVE STATEMENT:  I need to have a neurological exam. If I walk with my hands out and I am more balance. My folate is low. When I contract the anus everything else contracts. No pain in the pelvic floor. The replens works great for the vaginal dryness. I stopped the medication for the bladder due to it being expensive. I am not wetting with my pants. I sleep all night. I am not leaking urine. I have to sit  a little to fully urinate. I am dizzy during the day that happened in the last 3 weeks. I am wearing pads due to leaking from the rectum. I leak due to taking the Ducolax. I am eating more fiber to firm up my stools.      PAIN:  Are you having pain? no NPRS scale: 0/10 Pain location:  pelvic floor     PRECAUTIONS: Other: had a stroke ; hearing deficits, Melanoma   WEIGHT BEARING RESTRICTIONS No   FALLS:  Has patient fallen in last 6 months? No   LIVING ENVIRONMENT: Lives with: lives alone   OCCUPATION: retired   PLOF: Independent   PATIENT GOALS stop the stool leakage to return to swimming and activities and not have to have surgery again   PERTINENT HISTORY:  Posterior repair, perineoplasty cystoscopy, urethral bulking, 08/30/2021; Breast Cancer 12/2011 right, ER+; HX of radiation therapy  03/12/12-04/13/12; Hypertension; IBS; COPD; Stroke; Kyphoplasty 06/15/2017; Transobturator sling 07/05/2005; Melanoma   BOWEL MOVEMENT Pain with bowel movement: No Type of bowel movement:Type (Bristol Stool Scale) 1, 3,4, 5, Frequency daily , and Strain No Fully empty rectum: Yes: 90% of the time  but if has Type 1 and will go again Leakage: Yes: comes our randomly Pads: Yes: 2-3 per week Fiber supplement: Yes: colace   URINATION Pain with urination: Yes when she  does not have enough fluid Fully empty bladder: Yes:   Stream: Strong, she will urinate a second time.  Urgency: No Frequency: average Leakage:  when waiting too long to go to the bathroom Pads: Yes: 2-3 per week       OBJECTIVE:    DIAGNOSTIC FINDINGS:  none   COGNITION:            Overall cognitive status: Impaired, expressive communication from stroke on the left side                        SENSATION:            Light touch: Appears intact            Proprioception: Appears intact                POSTURE: No Significant postural limitations               PELVIC ALIGNMENT:correct alignment   LUMBARAROM/PROM   A/PROM A/PROM  eval  Flexion Decreased by 25%  Extension Decreased by 25%  Right lateral flexion Decreased by 25%  Left lateral flexion Decreased by 25%  Right rotation Decreased by 25%  Left rotation Decreased by 25%   (Blank rows = not tested)   LOWER EXTREMITY ROM:   Passive ROM Right eval Left eval  Hip external rotation 25 75   (Blank rows = not tested)   LOWER EXTREMITY MMT:   MMT Right eval Left eval  Hip extension 4/5 4/5  Hip abduction 4/5 4/5     PALPATION:   General  tenderness located above the umbilicus;                  External Perineal Exam tightness in the perineal body                             Internal Pelvic Floor tightness in the internal anal sphincter, along the puborectalis   Patient confirms identification and approves PT to assess internal pelvic floor and treatment Yes   PELVIC MMT:   MMT eval  Internal Anal Sphincter 2/5 1 sec  External Anal Sphincter 2/5 1 sec  Puborectalis 2/5 1 sec  (Blank rows = not tested)         TONE: good   PROLAPSE: none   TODAY'S TREATMENT  12/08/2021 Neuromuscular re-education: Pelvic floor contraction training:supine ball squeeze hold 5 sec and contract the anus Bridge with anal contraction and ball squeeze Supine hip abduction with red band 15x Supine marching with red  band 30x    EVAL Date: 11/03/2021 HEP established-see below      PATIENT EDUCATION:  12/08/2021 Education details: Access Code: [email or web address] Person educated: Patient Education method: Explanation, Demonstration, Tactile cues, Verbal cues, and Handouts Education comprehension: verbalized understanding, returned demonstration, verbal cues required, tactile cues required, and needs further education     HOME EXERCISE PROGRAM: 12/08/2021 Access Code: [email or web address] URL: [email or web address] Date: 12/08/2021 Prepared  by: Earlie Counts  Exercises - Sidelying Pelvic Floor Contraction with Self-Palpation  - 3 x daily - 7 x weekly - 1 sets - 5 reps - 3 sec hold - Supine Hip Adduction Isometric with Ball  - 1 x daily - 4 x weekly - 1 sets - 5 reps - 5 sec hold - Supine Bridge with Mini Swiss Ball Between Knees  - 1 x daily - 4 x weekly - 1 sets - 15 reps - Hooklying Isometric Clamshell  - 1 x daily - 4 x weekly - 1 sets - 15 reps - Supine March with Resistance Band  - 1 x daily - 4 x weekly - 1 sets - 15 reps    ASSESSMENT:   CLINICAL IMPRESSION: Patient is a 81 y.o. female who was seen today for physical therapy treatment for mixed incontinence.  Patient was able to do the exercises. She is not leaking urine. She is leaking stool. Patient is having trouble with balance and waiting to see the neurologist. Patient will benefit from skilled therapy to improve rectal strength to reduce her stool leakage.      OBJECTIVE IMPAIRMENTS decreased activity tolerance, decreased coordination, decreased endurance, decreased strength, increased fascial restrictions, and pain.    ACTIVITY LIMITATIONS continence and toileting   PARTICIPATION LIMITATIONS: community activity   PERSONAL FACTORS Age, Fitness, Time since onset of injury/illness/exacerbation, and 3+ comorbidities:    Posterior repair, perineoplasty cystoscopy, urethral bulking, 08/30/2021; Breast Cancer 12/2011 right, ER+; HX of radiation  therapy 03/12/12-04/13/12; Hypertension; IBS; COPD; Stroke; Kyphoplasty 06/15/2017; are also affecting patient's functional outcome.    REHAB POTENTIAL: Excellent   CLINICAL DECISION MAKING: Stable/uncomplicated   EVALUATION COMPLEXITY: Low     GOALS: Goals reviewed with patient? Yes   SHORT TERM GOALS: Target date: 12/01/2021   Patient independent with diaphragmatic breathing to bulge the pelvic floor.  Baseline: Goal status: Met 12/08/2021   2.  Patient educated patient on correct toileting to fully empty her rectum.  Baseline:  Goal status: Met 12/08/2021     LONG TERM GOALS: Target date: 01/26/2022    Patient independent with pelvic floor strengthening to reduce her leakage.  Baseline:  Goal status: INITIAL   2.  Rectal strength is 3/5 with good lift of the rectum to reduce stool leakage.  Baseline:  Goal status: INITIAL   3.  Patient reports she is not having to wear a pad due to reduction on stool leakage.  Baseline:  Goal status: INITIAL   4.  Patient reports her lower abdominal pain decreased </= 2/10 due to improved tissue restrictions.  Baseline:  Goal status: Met 12/08/2021       PLAN: PT FREQUENCY: 1x/week   PT DURATION: 12 weeks   PLANNED INTERVENTIONS: Therapeutic exercises, Therapeutic activity, Neuromuscular re-education, Patient/Family education, Self Care, Dry Needling, Electrical stimulation, Cryotherapy, Moist heat, Contrast bath, and Manual therapy   PLAN FOR NEXT SESSION: continue to progress HEP, diaphragmatic breathing  Earlie Counts, PT 12/08/21 2:44 PM

## 2021-12-10 ENCOUNTER — Telehealth: Payer: Self-pay | Admitting: Adult Health

## 2021-12-10 DIAGNOSIS — R42 Dizziness and giddiness: Secondary | ICD-10-CM

## 2021-12-10 NOTE — Telephone Encounter (Signed)
Pt is calling and still waiting for the referral to neurologist for dizziness . Pt was seen on 12-03-2021

## 2021-12-14 NOTE — Telephone Encounter (Signed)
Referral placed.

## 2021-12-15 ENCOUNTER — Ambulatory Visit: Payer: Medicare Other | Attending: Obstetrics and Gynecology | Admitting: Physical Therapy

## 2021-12-15 ENCOUNTER — Encounter: Payer: Self-pay | Admitting: Physical Therapy

## 2021-12-15 DIAGNOSIS — M6281 Muscle weakness (generalized): Secondary | ICD-10-CM | POA: Insufficient documentation

## 2021-12-15 DIAGNOSIS — R278 Other lack of coordination: Secondary | ICD-10-CM | POA: Insufficient documentation

## 2021-12-15 NOTE — Therapy (Signed)
OUTPATIENT PHYSICAL THERAPY TREATMENT NOTE   Patient Name: Alexandria [name] MRN: [id number] DOB:07-28-1940, 81 y.o., female Today's Date: 12/15/2021  PCP: [name], NP REFERRING PROVIDER: Damian Leavell, MD  END OF SESSION:   PT End of Session - 12/15/21 1359     Visit Number 3    Date for PT Re-Evaluation 01/26/22    Authorization Type [employer] medicare    Authorization - Visit Number 3    Authorization - Number of Visits 10    PT Start Time 1400    PT Stop Time 1440    PT Time Calculation (min) 40 min    Activity Tolerance Patient tolerated treatment well    Behavior During Therapy WFL for tasks assessed/performed             Past Medical History:  Diagnosis Date   Allergy    Anemia    hx of    Anxiety    Arthritis    left knee, hands, back   Asthma    daily and prn inhalers   Atypical chest pain 11/26/2020   Brainstem infarct, 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 since age 13   IBS (irritable bowel syndrome)    Long term current use of aromatase inhibitor 05/08/2012   Overactive bladder    Palpitations    Pneumonia    hx of x 2    PONV (postoperative nausea and vomiting)    PVD (peripheral vascular disease) (HCC)    varicose veins - left worse than right   Spinal stenosis    Stress incontinence    Stroke [facility])    problem with expressive communicaiton   Traumatic hemopneumothorax 1982   left   Past  Surgical History:  Procedure Laterality Date   ANKLE HARDWARE REMOVAL     right   BREAST BIOPSY  1976   right   BREAST BIOPSY Left 2019   benign lymph node   BREAST LUMPECTOMY  2013   right with snbx   CHOLECYSTECTOMY  1990s   COLONOSCOPY     CYSTOSCOPY  07/05/2005   KNEE SURGERY  1980s   left   KYPHOPLASTY N/A 06/15/2017   Procedure: KYPHOPLASTY T11;  Surgeon: Melina Schools, MD;  Location: [place];  Service: Orthopedics;  Laterality: N/A;  90 mins   ORIF ANKLE FRACTURE  1980s   right    TOTAL KNEE ARTHROPLASTY Left 12/08/2015   Procedure: LEFT TOTAL KNEE ARTHROPLASTY;  Surgeon: Paralee Cancel, MD;  Location: [facility];  Service: Orthopedics;  Laterality: Left;   TRANSOBTURATOR SLING  07/05/2005   TUBAL LIGATION  1976   VEIN SURGERY     vein ablation left leg   Patient Active Problem List   Diagnosis Date Noted   Atypical chest pain 11/26/2020   S/P kyphoplasty 06/15/2017  S/P total knee replacement using cement 12/08/2015   Atrial fibrillation ([place]) 11/19/2015   Anxiety and depression 07/09/2015   Varicose veins of bilateral lower extremities with other complications 46/80/3212   Essential hypertension, benign 12/08/2014   Acute hemorrhagic cystitis 02/18/2014   Stroke ([place]) 05/15/2012   Spinal stenosis    COPD (chronic obstructive pulmonary disease) ([place])    Long term current use of aromatase inhibitor 05/08/2012   Hx of radiation therapy    Breast cancer of upper-inner quadrant of right female breast ([place]) 12/23/2011   Ataxia 06/23/2011   Contact dermatitis and eczema due to plant 01/04/2011   BACK PAIN 08/11/2009   PALPITATIONS, RECURRENT 02/27/2009   PERS HX TOBACCO USE PRESENTING HAZARDS HEALTH 12/17/2008   Hyperlipidemia 02/26/2008   ASTHMA 02/26/2008   GERD 02/12/2007   REFERRING DIAG: N39.46 (ICD-10-CM) - Mixed incontinence   THERAPY DIAG:  Muscle weakness (generalized)   Other lack of coordination   Rationale for Evaluation and Treatment Rehabilitation    ONSET DATE: 08/30/2021   SUBJECTIVE:                                                                                                                                                                                            SUBJECTIVE STATEMENT:  I did my exercises 3 times per week.     PAIN:  Are you having pain? no NPRS scale: 0/10 Pain location:  pelvic floor     PRECAUTIONS: Other: had a stroke ; hearing deficits, Melanoma   WEIGHT BEARING RESTRICTIONS No   FALLS:  Has patient fallen in last 6 months? No   LIVING ENVIRONMENT: Lives with: lives alone   OCCUPATION: retired   PLOF: Independent   PATIENT GOALS stop the stool leakage to return to swimming and activities and not have to have surgery again   PERTINENT HISTORY:  Posterior repair, perineoplasty cystoscopy, urethral bulking, 08/30/2021; Breast Cancer 12/2011 right, ER+; HX of radiation therapy 03/12/12-04/13/12; Hypertension; IBS; COPD; Stroke; Kyphoplasty 06/15/2017; Transobturator sling 07/05/2005; Melanoma   BOWEL MOVEMENT Pain with bowel movement: No Type of bowel movement:Type ([place] Stool Scale) 1, 3,4, 5, Frequency daily , and Strain No Fully empty rectum: Yes: 90% of the time  but if has Type 1 and will go again Leakage: Yes: comes our randomly Pads: Yes: 2-3 per week Fiber supplement: Yes: colace   URINATION Pain with urination: Yes when she does not have enough fluid Fully empty bladder: Yes:   Stream: Strong, she will urinate a second time.  Urgency: No Frequency: average Leakage:  when waiting too long to go to the bathroom Pads: Yes: 2-3 per  week       OBJECTIVE:    DIAGNOSTIC FINDINGS:  none   COGNITION:            Overall cognitive status: Impaired, expressive communication from stroke on the left side                        SENSATION:            Light touch: Appears intact            Proprioception: Appears intact                POSTURE: No Significant postural limitations                PELVIC ALIGNMENT:correct alignment   LUMBARAROM/PROM   A/PROM A/PROM  eval  Flexion Decreased by 25%  Extension Decreased by 25%  Right lateral flexion Decreased by 25%  Left lateral flexion Decreased by 25%  Right rotation Decreased by 25%  Left rotation Decreased by 25%   (Blank rows = not tested)   LOWER EXTREMITY ROM:   Passive ROM Right eval Left eval  Hip external rotation 25 75   (Blank rows = not tested)   LOWER EXTREMITY MMT:   MMT Right eval Left eval  Hip extension 4/5 4/5  Hip abduction 4/5 4/5     PALPATION:   General  tenderness located above the umbilicus;                  External Perineal Exam tightness in the perineal body                             Internal Pelvic Floor tightness in the internal anal sphincter, along the puborectalis   Patient confirms identification and approves PT to assess internal pelvic floor and treatment Yes   PELVIC MMT:   MMT eval  Internal Anal Sphincter 2/5 1 sec  External Anal Sphincter 2/5 1 sec  Puborectalis 2/5 1 sec  (Blank rows = not tested)         TONE: good   PROLAPSE: none   TODAY'S TREATMENT  12/15/2021 Neuromuscular re-education: Pelvic floor contraction training:all squeeze hold 5 sec and contract the anus Bridge with anal contraction and ball squeeze Supine hip abduction with red band 15x Supine marching with red band 30x Sitting pelvic floor contraction with therapist giving tactile cues to the anus for a lift.     12/08/2021 Neuromuscular re-education: Pelvic floor contraction training:supine ball squeeze hold 5 sec and contract the anus Bridge with anal contraction and ball squeeze Supine hip abduction with red band 15x Supine marching with red band 30x    EVAL Date: 11/03/2021 HEP established-see below      PATIENT EDUCATION:  12/15/2021 Education details: Access Code: [email or web address] Person educated: Patient Education method: Explanation, Demonstration, Tactile cues, Verbal  cues, and Handouts Education comprehension: verbalized understanding, returned demonstration, verbal cues required, tactile cues required, and needs further education     HOME EXERCISE PROGRAM: 12/15/2021  Access Code: [email or web address] URL: [email or web address] Date: 12/15/2021 Prepared by: Earlie Counts  Exercises - Sidelying Pelvic Floor Contraction with Self-Palpation  - 3 x daily - 7 x weekly - 1 sets - 5 reps - 3 sec hold - Supine Hip Adduction Isometric with Ball  - 1 x daily - 4 x weekly - 1 sets - 5 reps - 5 sec hold - Supine Bridge with  Mini Swiss Ball Between Knees  - 1 x daily - 4 x weekly - 1 sets - 15 reps - Hooklying Isometric Clamshell  - 1 x daily - 4 x weekly - 1 sets - 15 reps - Supine March with Resistance Band  - 1 x daily - 4 x weekly - 1 sets - 15 reps - Seated Pelvic Floor Contraction  - 2 x daily - 7 x weekly - 1 sets - 10 reps - 5 sec hold   ASSESSMENT:   CLINICAL IMPRESSION: Patient is a 81 y.o. female who was seen today for physical therapy treatment for mixed incontinence.  She is leaking stool. And is 30% better. She is able to isolate the anal contraction with tactile cues from the therapist. Patient is having trouble with balance and waiting to see the neurologist. Patient will benefit from skilled therapy to improve rectal strength to reduce her stool leakage.      OBJECTIVE IMPAIRMENTS decreased activity tolerance, decreased coordination, decreased endurance, decreased strength, increased fascial restrictions, and pain.    ACTIVITY LIMITATIONS continence and toileting   PARTICIPATION LIMITATIONS: community activity   PERSONAL FACTORS Age, Fitness, Time since onset of injury/illness/exacerbation, and 3+ comorbidities:    Posterior repair, perineoplasty cystoscopy, urethral bulking, 08/30/2021; Breast Cancer 12/2011 right, ER+; HX of radiation therapy 03/12/12-04/13/12; Hypertension; IBS; COPD; Stroke; Kyphoplasty 06/15/2017; are also affecting patient's  functional outcome.    REHAB POTENTIAL: Excellent   CLINICAL DECISION MAKING: Stable/uncomplicated   EVALUATION COMPLEXITY: Low     GOALS: Goals reviewed with patient? Yes   SHORT TERM GOALS: Target date: 12/01/2021   Patient independent with diaphragmatic breathing to bulge the pelvic floor.  Baseline: Goal status: Met 12/08/2021   2.  Patient educated patient on correct toileting to fully empty her rectum.  Baseline:  Goal status: Met 12/08/2021     LONG TERM GOALS: Target date: 01/26/2022    Patient independent with pelvic floor strengthening to reduce her leakage.  Baseline:  Goal status: INITIAL   2.  Rectal strength is 3/5 with good lift of the rectum to reduce stool leakage.  Baseline:  Goal status: INITIAL   3.  Patient reports she is not having to wear a pad due to reduction on stool leakage.  Baseline:  Goal status: INITIAL   4.  Patient reports her lower abdominal pain decreased </= 2/10 due to improved tissue restrictions.  Baseline:  Goal status: Met 12/08/2021       PLAN: PT FREQUENCY: 1x/week   PT DURATION: 12 weeks   PLANNED INTERVENTIONS: Therapeutic exercises, Therapeutic activity, Neuromuscular re-education, Patient/Family education, Self Care, Dry Needling, Electrical stimulation, Cryotherapy, Moist heat, Contrast bath, and Manual therapy   PLAN FOR NEXT SESSION: continue to progress HEP, diaphragmatic breathing  Earlie Counts, PT 12/15/21 2:39 PM

## 2021-12-21 ENCOUNTER — Telehealth: Payer: Self-pay | Admitting: Adult Health

## 2021-12-21 ENCOUNTER — Other Ambulatory Visit: Payer: Self-pay | Admitting: Adult Health

## 2021-12-21 DIAGNOSIS — R42 Dizziness and giddiness: Secondary | ICD-10-CM

## 2021-12-21 DIAGNOSIS — I639 Cerebral infarction, unspecified: Secondary | ICD-10-CM

## 2021-12-21 NOTE — Telephone Encounter (Signed)
Pt is calling and [place]  neurologist has not set her up an appt yet and she would like cory to return her call there is note in the referral

## 2021-12-22 ENCOUNTER — Ambulatory Visit: Payer: Medicare Other | Admitting: Physical Therapy

## 2021-12-22 ENCOUNTER — Telehealth: Payer: Medicare Other | Admitting: Adult Health

## 2021-12-23 ENCOUNTER — Other Ambulatory Visit: Payer: Self-pay | Admitting: Adult Health

## 2021-12-23 MED ORDER — TRAMADOL HCL 50 MG PO TABS: 50.0000 mg | ORAL_TABLET | Freq: Three times a day (TID) | ORAL | 0 refills | Status: DC | PRN

## 2021-12-23 NOTE — Telephone Encounter (Signed)
Pt declined appt. And stated that she spoke with [facility] regarding the pain issue and would like Ultram called in.

## 2021-12-23 NOTE — Telephone Encounter (Signed)
Pt is calling requesting ultram/tramadol for pain relief. States percocet does not work unless she takes amounts that make her [place], [place] Phone:  [phone]  Fax:  [phone]

## 2021-12-29 ENCOUNTER — Ambulatory Visit: Payer: Medicare Other | Admitting: Physical Therapy

## 2021-12-30 ENCOUNTER — Telehealth: Payer: Self-pay | Admitting: Cardiovascular Disease

## 2021-12-30 MED ORDER — ISOSORBIDE MONONITRATE ER 30 MG PO TB24: ORAL_TABLET | ORAL | 3 refills | Status: DC

## 2021-12-30 NOTE — Telephone Encounter (Signed)
Pt c/o of Chest Pain: STAT if CP now or developed within 24 hours  1. Are you having CP right now? No   2. Are you experiencing any other symptoms (ex. SOB, nausea, vomiting, sweating)? Sweating   3. How long have you been experiencing CP? Since the middle of Sept   4. Is your CP continuous or coming and going? Coming and going depending on how active she is.   5. Have you taken Nitroglycerin? Took 2  ?

## 2021-12-30 NOTE — Telephone Encounter (Addendum)
Spoke with patient regarding her chest pain Stated it is across entire chest but does radiate into right shoulder down arm Discomfort comes with exertion just doing ADL's, has been going on since 9/22 She saw PCP 9/22 for fatigue, increased bruising, staggering, and joint pain.  Per patient PCP wanted to start OTC iron but she can not take secondary to IBS She increased iron in dietary intake and is feeling better in regards to why saw PCP  Yesterday started having the chest pain/shoulder pain so she took her arthritis medication and Tramadol, no improvement. She took NTG x 2 several hours later and before the 2nd NTG completely dissolved her chest had resolved.  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  She is allergic to ASA   Discussed with Overton Mam NP and will start Imdur 15 mg daily at bedtime and scheduled her appointment next week Advised patient and scheduled appointment with A Duke PA for 10/24. She is to take it easy until seen  Patient aware of date, time and location

## 2022-01-03 NOTE — Progress Notes (Unsigned)
Cardiology Office Note:    Date:  01/05/2022   ID:  Alexandria [name], DOB 04-11-1940, MRN [id number]  PCP:  Alexandria Peng, NP   [facility] Providers Cardiologist:  Alexandria Latch, MD { Referring MD: Alexandria Peng, NP   Chief Complaint  Patient presents with   Follow-up    Chest pain    History of Present Illness:    Alexandria [name] is a 81 y.o. female with a hx of nonobstructive CAD by coronary CTA 12/03/2020, HTN, PAF, acute brainstem infarct and stroke, GERD, DVT, COPD, HLD, and PVD. She has a hx of breast cancer and s/p radiation therapy. Stress test 2010 with EF 82% that was nonischemic and negative for infarct. She was seen in 2014 by Dr. Percival Spanish for palpitations following her stoke. She was transitioned to toprol. Echo 05/2012 with preserved EF, no RWMA, no cardiac source of emboli. Nuclear stress test 2014 was nonischemic.  In 2022 she had a series of mechanical falls resulting in 3 small thrombi above the ventricles in her posterior brain and a fall backwards onto concrete resulting in fracture of T11.  She also had a fall in which she sustained a left rotator cuff tear.  She did report chest pain that she attributed to GERD at the time.  Due to exertional chest discomfort, she underwent cardiac CTA 11/2020 that showed a coronary calcium score of 71 placing her at the 47th percentile. Coronary CTA 12/03/20 showed mild to moderate two-vessel disease that was not significant by FFR analysis (RCA and large D1).  She is allergic to aspirin.   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  Simvastatin was reduced to twice per week for myalgias.  She was started on Repatha with improvement in her LDL to 66 on 11/02/2020.  She has residual word finding difficulties from prior stroke.  She called our office with nitro responsive exertional chest pain x1 month.  She was started on low dose imdur and placed on my  schedule.   She is a retired Marine scientist in psychiatry.   Today she describes chest pain as pain in her scapula. She is having 2 different kinds of chest pain. She is sitting with knees lifted on exam table and pressing on her knees to relieve her scapula pain. She has a history of kyphoplasty after injury to T11, she thinks she is having more nerve pain stemming from her back. She also describes neck pain, multiple prior head injuries. Chest pain has been completely relieved with imdur. CP was a substernal, burning, "pushing" pain accompanied with nausea. She described it as an elephant sitting on her chest but states it was mostly radiation to her scapula. She has not had a recurrence of chest pain since starting imdur. However, she is having headaches. EKG is nonischemic today.   No further chest pain but she has stopped walking or exerting herself due to prior CP. I have asked her to resume activities and walk her 7 steps at home repeatedly, as she did prior to this. She will also walk aisles at the grocery store.  She reports fatigue over the last several weeks, which she thinks was worsened by imdur. She is in significant pain with her back. She has multiple complaints with prior injuries. She does have some dizziness at night after taking imdur. She takes HCTZ 12.5 mg BID. I have asked her to stop the nighttime dose.    Past  Medical History:  Diagnosis Date   Allergy    Anemia    hx of    Anxiety    Arthritis    left knee, hands, back   Asthma    daily and prn inhalers   Atypical chest pain 11/26/2020   Brainstem infarct, acute ([place]) 03/2011   slight expressive aphasia, occ. problems with balance   Breast cancer ([place]) 12/2011   right, ER+, PR -, Her 2 -   COPD (chronic obstructive pulmonary disease) (HCC)    Degenerative joint disease of low back    Depression    Dysrhythmia    atrial fib    GERD (gastroesophageal reflux disease)    Hearing loss    bilateral hearing aids   History of  glomerulonephritis as a child   and had abscess left kidney   History of kidney stones    Hx of radiation therapy 03/12/12 -04/13/12   right breast   Hyperlipidemia    Hypertension    under control, has been on med. since age 55   IBS (irritable bowel syndrome)    Long term current use of aromatase inhibitor 05/08/2012   Overactive bladder    Palpitations    Pneumonia    hx of x 2    PONV (postoperative nausea and vomiting)    PVD (peripheral vascular disease) ([place])    varicose veins - left worse than right   Spinal stenosis    Stress incontinence    Stroke [facility])    problem with expressive communicaiton   Traumatic hemopneumothorax 1982   left    Past Surgical History:  Procedure Laterality Date   ANKLE HARDWARE REMOVAL     right   BREAST BIOPSY  1976   right   BREAST BIOPSY Left 2019   benign lymph node   BREAST LUMPECTOMY  2013   right with snbx   CHOLECYSTECTOMY  1990s   COLONOSCOPY     CYSTOSCOPY  07/05/2005   KNEE SURGERY  1980s   left   KYPHOPLASTY N/A 06/15/2017   Procedure: KYPHOPLASTY T11;  Surgeon: Melina Schools, MD;  Location: [facility];  Service: Orthopedics;  Laterality: N/A;  90 mins   ORIF ANKLE FRACTURE  1980s   right    TOTAL KNEE ARTHROPLASTY Left 12/08/2015   Procedure: LEFT TOTAL KNEE ARTHROPLASTY;  Surgeon: Paralee Cancel, MD;  Location: [facility];  Service: Orthopedics;  Laterality: Left;   TRANSOBTURATOR SLING  07/05/2005   TUBAL LIGATION  1976   VEIN SURGERY     vein ablation left leg    Current Medications: Current Meds  Medication Sig   albuterol (PROAIR HFA) 108 (90 Base) MCG/ACT inhaler Inhale 2 puffs into the lungs every 6 (six) hours as needed for wheezing or shortness of breath.   amLODipine (NORVASC) 10 MG tablet TAKE 1 TABLET EACH DAY.   ARNICA EX Apply 1 application topically daily as needed (pain).   b complex vitamins tablet Take 1 tablet by mouth 2 (two) times daily. PLUS FOLIC ACID PLUS VIT. C   Biotin 5000 MCG CAPS Take 5,000 mcg by  mouth daily.    Capsaicin 0.025 % PADS Apply 1 application topically daily as needed (pain).   Cholecalciferol (VITAMIN D-3) 5000 UNITS TABS Take 5,000 Units by mouth daily.    clopidogrel (PLAVIX) 75 MG tablet TAKE 1 TABLET BY MOUTH DAILY.   CO-ENZYME Q10-VITAMIN E PO Take 1 tablet by mouth daily.   Diclofenac Sodium (PENNSAID) 2 % SOLN Apply 1  application topically 3 (three) times daily. (Patient taking differently: Apply 1 application  topically as needed.)   diphenhydrAMINE (BENADRYL) 25 MG tablet Take 25 mg by mouth every 6 (six) hours as needed. Patient taking 1/2 (12.5 mg) as needed for allergies and itching   escitalopram (LEXAPRO) 10 MG tablet TAKE ONE TABLET BY MOUTH ONCE DAILY   Evolocumab (REPATHA SURECLICK) 086 MG/ML SOAJ Inject 140 mg into the skin every 14 (fourteen) days.   fluticasone (FLONASE) 50 MCG/ACT nasal spray USE 1 SPRAY IN EACH NOSTRIL DAILY.   fluticasone-salmeterol (ADVAIR HFA) 230-21 MCG/ACT inhaler Inhale 2 puffs into the lungs 2 (two) times daily.   hydrochlorothiazide (MICROZIDE) 12.5 MG capsule Take 12.5 mg by mouth daily. Take 1 Tablet Daily   ipratropium (ATROVENT) 0.03 % nasal spray Place 2 sprays into both nostrils every 12 (twelve) hours.   loratadine (CLARITIN) 10 MG tablet Take 10 mg by mouth daily as needed for allergies.   Misc Natural Products (OSTEO BI-FLEX ADV TRIPLE ST PO) Take 1 tablet by mouth daily.   montelukast (SINGULAIR) 10 MG tablet Take 1 tablet (10 mg total) by mouth at bedtime.   nitroGLYCERIN (NITROSTAT) 0.4 MG SL tablet Place 1 tablet (0.4 mg total) under the tongue every 5 (five) minutes as needed for chest pain.   ondansetron (ZOFRAN-ODT) 4 MG disintegrating tablet DISSOLVE 1 TABLET ON TONGUE EVERY 8 HOURS AS NEEDED FOR NAUSEA/VOMITING.   pantoprazole (PROTONIX) 40 MG tablet TAKE ONE TABLET BY MOUTH DAILY   Polyethyl Glycol-Propyl Glycol (SYSTANE OP) Place 1 drop into both eyes daily as needed. Dry eyes   potassium chloride (KLOR-CON)  10 MEQ tablet Take 2 tablets (20 mEq total) by mouth 2 (two) times daily.   Probiotic Product (ALIGN PO) Take 1 tablet by mouth daily.   Selenium 200 MCG CAPS Take 200 mcg by mouth 2 (two) times daily.    simvastatin (ZOCOR) 20 MG tablet Take 0.5 tablets (10 mg total) by mouth 2 (two) times a week.   Trolamine Salicylate (ASPERCREME EX) Apply 1 application topically daily as needed (pain).   [DISCONTINUED] hydrochlorothiazide (MICROZIDE) 12.5 MG capsule TAKE (1) CAPSULE TWICE DAILY.   [DISCONTINUED] isosorbide mononitrate (IMDUR) 30 MG 24 hr tablet Take 1/2 tablet at bedtime     Allergies:   Augmentin [amoxicillin-pot clavulanate], Penicillins, Aspirin, Chlorhexidine gluconate, Chocolate, Coffee bean extract, Diclofenac, Ibuprofen, Oxycodone hcl, Shrimp [shellfish allergy], Sulfonamide derivatives, Lipitor [atorvastatin calcium], and Methocarbamol   Social History   Socioeconomic History   Marital status: Widowed    Spouse name: Not on file   Number of children: Not on file   Years of education: Not on file   Highest education level: Not on file  Occupational History   Not on file  Tobacco Use   Smoking status: Former    Packs/day: 0.50    Years: 15.00    Total pack years: 7.50    Types: Cigarettes    Quit date: 08/28/1993    Years since quitting: 28.3   Smokeless tobacco: Never  Vaping Use   Vaping Use: Never used  Substance and Sexual Activity   Alcohol use: No    Alcohol/week: 0.0 standard drinks of alcohol   Drug use: No   Sexual activity: Not Currently  Other Topics Concern   Not on file  Social History Narrative   Widowed since 2014   Retired Scientist, product/process development    Son and daughter   Social Determinants of Health   Financial Resource Strain: [place]  (  11/26/2020)   Overall Financial Resource Strain (CARDIA)    Difficulty of Paying Living Expenses: Not hard at all  Food Insecurity: No Food Insecurity (11/26/2020)   Hunger Vital Sign    Worried About Running Out of Food  in the Last Year: Never true    Ran Out of Food in the Last Year: Never true  Transportation Needs: No Transportation Needs (11/26/2020)   PRAPARE - Hydrologist (Medical): No    Lack of Transportation (Non-Medical): No  Physical Activity: Insufficiently Active (11/26/2020)   Exercise Vital Sign    Days of Exercise per Week: 3 days    Minutes of Exercise per Session: 30 min  Stress: Not on file  Social Connections: Not on file     Family History: The patient's family history includes Breast cancer in her cousin, daughter, paternal aunt, paternal aunt, and paternal uncle; Cancer in her maternal aunt and paternal aunt; Congestive Heart Failure in her paternal uncle and paternal uncle; Dementia in her paternal aunt; Heart attack in her father and paternal grandfather; Stomach cancer in her paternal uncle; Stroke in her mother.  ROS:   Please see the history of present illness.     All other systems reviewed and are negative.  EKGs/Labs/Other Studies Reviewed:    The following studies were reviewed today:  CT coronary 2023: IMPRESSION: 1. Coronary calcium score of 71 (LAD 50, RCA 21). This was 76 percentile for age and sex matched control.   2. Normal coronary origin with right dominance.   3. Moderate CAD in LAD and RCA distribution. Will send for FFR analysis.   4.  Aortic atherosclerosis.  FFR: 1. Left Main: No significant stenosis.   2. LAD: No significant stenosis. 3. LCX: No significant stenosis. 4. RCA: No significant stenosis.   IMPRESSION: 1.  CT FFR analysis didn't show any significant stenosis.   EKG:  EKG is  ordered today.  The ekg ordered today demonstrates sinus rhythm with HR 61  Recent Labs: 02/16/2021: TSH 2.24 11/02/2021: ALT 11; BUN 24; Creatinine, Ser 0.95; Potassium 4.3; Sodium 143 12/03/2021: Hemoglobin 12.1; Platelets 213.0  Recent Lipid Panel    Component Value Date/Time   CHOL 156 11/02/2021 0826   TRIG 102  11/02/2021 0826   HDL 72 11/02/2021 0826   CHOLHDL 2.2 11/02/2021 0826   CHOLHDL 5 07/10/2019 1441   VLDL 36.6 07/10/2019 1441   LDLCALC 66 11/02/2021 0826   LDLDIRECT 183.5 12/17/2008 0953     Risk Assessment/Calculations:                Physical Exam:    VS:  BP (!) 118/58   Pulse 61   Ht 5' 3.5" (1.613 m)   Wt 137 lb 6.4 oz (62.3 kg)   SpO2 98%   BMI 23.96 kg/m     Wt Readings from Last 3 Encounters:  01/05/22 137 lb 6.4 oz (62.3 kg)  12/03/21 139 lb (63 kg)  11/04/21 139 lb (63 kg)     GEN:  Well nourished, well developed in no acute distress HEENT: Normal NECK: No JVD; No carotid bruits LYMPHATICS: No lymphadenopathy CARDIAC: RRR, no murmurs, rubs, gallops RESPIRATORY:  Clear to auscultation without rales, wheezing or rhonchi  ABDOMEN: Soft, non-tender, non-distended MUSCULOSKELETAL:  No edema; No deformity  SKIN: Warm and dry NEUROLOGIC:  Alert and oriented x 3 PSYCHIATRIC:  Normal affect   ASSESSMENT:    1. Coronary artery disease of native artery of native heart  with stable angina pectoris ([place])   2. Chest pain of uncertain etiology   3. Essential hypertension, benign   4. History of CVA (cerebrovascular accident)   5. History of DVT (deep vein thrombosis)   6. Other fatigue   7. Hyperlipidemia LDL goal <70    PLAN:    In order of problems listed above:  Chest pain Nonobstructive CAD by CT coronary CP has been relieved with imdur, but complicated by headache.  Chest pain description is also complicated by multiple prior injuries, including back and neck injuries.  It is unclear what portion of her chest pain may be attributed to angina.  It is somewhat concerning that chest pain has been completely relieved with Imdur.  I again reviewed her CT coronary with negative FFR. We discussed definitive angiography, but she is no longer having symptoms on low dose imdur. She is also on 10 mg amlodipine. She will resume activities as she has been very  sedentary. I think if she has a recurrence of chest pain, we will likely need to proceed with heart catheterization, even though this may postpone any kind of upcoming back surgery.  I gave her strict ER precautions should her chest pain return.   Hypertension Medications as above. Conitnue 10 mg amlodipine and 15 mg imdur. Reduce HCTZ to 12.5 mg to once daily. She will stop the evening dose.    PAF - in 1995 on 24 hr holter monitor Prior stroke Superficial DVT Not on anticoagulation No palpitations   Fatigue Unclear if this is medication related vs arrhythmia vs CAD She denies OSA symptoms   Hyperlipidemia with LDL goal less than 70 Intolerant to higher doses or more frequent dosing of statin On Repatha 10/2021: LDL 66  Close follow-up within the next 3 to 4 weeks with Dr. Oval Linsey or Laurann Montana.   Medication Adjustments/Labs and Tests Ordered: Current medicines are reviewed at length with the patient today.  Concerns regarding medicines are outlined above.  Orders Placed This Encounter  Procedures   EKG 12-Lead   Meds ordered this encounter  Medications   isosorbide mononitrate (IMDUR) 30 MG 24 hr tablet    Sig: Take 1/2 tablet at bedtime    Dispense:  30 tablet    Refill:  3    Patient Instructions  Medication Instructions:  Decrease HCTZ 12.5 mg ( From 1 Tablet Twice Daily to 1 Tablet Daily). *If you need a refill on your cardiac medications before your next appointment, please call your pharmacy*   Lab Work: No Labs If you have labs (blood work) drawn today and your tests are completely normal, you will receive your results only by: [place] (if you have MyChart) OR A paper copy in the mail If you have any lab test that is abnormal or we need to change your treatment, we will call you to review the results.   Testing/Procedures: No Testing   Follow-Up: At [facility], you and your health needs are our priority.  As part of our  continuing mission to provide you with exceptional heart care, we have created designated Provider Care Teams.  These Care Teams include your primary Cardiologist (physician) and Advanced Practice Providers (APPs -  Physician Assistants and Nurse Practitioners) who all work together to provide you with the care you need, when you need it.  We recommend signing up for the patient portal called "MyChart".  Sign up information is provided on this After Visit Summary.  MyChart is used to  connect with patients for Virtual Visits (Telemedicine).  Patients are able to view lab/test results, encounter notes, upcoming appointments, etc.  Non-urgent messages can be sent to your provider as well.   To learn more about what you can do with MyChart, go to [email or web address].    Your next appointment:   3-4 week(s)  The format for your next appointment:   In Person  Provider:   Laurann Montana, NP      Signed, [place], PA  01/05/2022 11:46 AM    [place]

## 2022-01-05 ENCOUNTER — Encounter: Payer: Self-pay | Admitting: Physician Assistant

## 2022-01-05 ENCOUNTER — Encounter: Payer: Self-pay | Admitting: Physical Therapy

## 2022-01-05 ENCOUNTER — Ambulatory Visit: Payer: Medicare Other | Admitting: Physical Therapy

## 2022-01-05 ENCOUNTER — Ambulatory Visit: Payer: Medicare Other | Attending: Physician Assistant | Admitting: Physician Assistant

## 2022-01-05 VITALS — BP 118/58 | HR 61 | Ht 63.5 in | Wt 137.4 lb

## 2022-01-05 DIAGNOSIS — R079 Chest pain, unspecified: Secondary | ICD-10-CM | POA: Diagnosis not present

## 2022-01-05 DIAGNOSIS — M6281 Muscle weakness (generalized): Secondary | ICD-10-CM

## 2022-01-05 DIAGNOSIS — Z86718 Personal history of other venous thrombosis and embolism: Secondary | ICD-10-CM

## 2022-01-05 DIAGNOSIS — Z8673 Personal history of transient ischemic attack (TIA), and cerebral infarction without residual deficits: Secondary | ICD-10-CM

## 2022-01-05 DIAGNOSIS — I25118 Atherosclerotic heart disease of native coronary artery with other forms of angina pectoris: Secondary | ICD-10-CM

## 2022-01-05 DIAGNOSIS — R278 Other lack of coordination: Secondary | ICD-10-CM

## 2022-01-05 DIAGNOSIS — E785 Hyperlipidemia, unspecified: Secondary | ICD-10-CM

## 2022-01-05 DIAGNOSIS — I10 Essential (primary) hypertension: Secondary | ICD-10-CM | POA: Diagnosis not present

## 2022-01-05 DIAGNOSIS — R5383 Other fatigue: Secondary | ICD-10-CM

## 2022-01-05 MED ORDER — ISOSORBIDE MONONITRATE ER 30 MG PO TB24: ORAL_TABLET | ORAL | 3 refills | Status: DC

## 2022-01-05 NOTE — Therapy (Signed)
OUTPATIENT PHYSICAL THERAPY TREATMENT NOTE   Patient Name: Alexandria [name] MRN: [id number] DOB:Feb 25, 1941, 81 y.o., female Today's Date: 01/05/2022  PCP: [name], NP REFERRING PROVIDER: Damian Leavell, MD  END OF SESSION:   PT End of Session - 01/05/22 1400     Visit Number 4    Date for PT Re-Evaluation 01/26/22    Authorization Type [employer] medicare    Authorization - Visit Number 4    Authorization - Number of Visits 10    PT Start Time 1400    PT Stop Time 1440    PT Time Calculation (min) 40 min    Activity Tolerance Patient tolerated treatment well    Behavior During Therapy WFL for tasks assessed/performed             Past Medical History:  Diagnosis Date   Allergy    Anemia    hx of    Anxiety    Arthritis    left knee, hands, back   Asthma    daily and prn inhalers   Atypical chest pain 11/26/2020   Brainstem infarct, 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 since age 49   IBS (irritable bowel syndrome)    Long term current use of aromatase inhibitor 05/08/2012   Overactive bladder    Palpitations    Pneumonia    hx of x 2    PONV (postoperative nausea and vomiting)    PVD (peripheral vascular disease) (HCC)    varicose veins - left worse than right   Spinal stenosis    Stress incontinence    Stroke [facility])    problem with expressive communicaiton   Traumatic hemopneumothorax 1982   left   Past  Surgical History:  Procedure Laterality Date   ANKLE HARDWARE REMOVAL     right   BREAST BIOPSY  1976   right   BREAST BIOPSY Left 2019   benign lymph node   BREAST LUMPECTOMY  2013   right with snbx   CHOLECYSTECTOMY  1990s   COLONOSCOPY     CYSTOSCOPY  07/05/2005   KNEE SURGERY  1980s   left   KYPHOPLASTY N/A 06/15/2017   Procedure: KYPHOPLASTY T11;  Surgeon: Melina Schools, MD;  Location: [facility];  Service: Orthopedics;  Laterality: N/A;  90 mins   ORIF ANKLE FRACTURE  1980s   right    TOTAL KNEE ARTHROPLASTY Left 12/08/2015   Procedure: LEFT TOTAL KNEE ARTHROPLASTY;  Surgeon: Paralee Cancel, MD;  Location: [facility];  Service: Orthopedics;  Laterality: Left;   TRANSOBTURATOR SLING  07/05/2005   TUBAL LIGATION  1976   VEIN SURGERY     vein ablation left leg   Patient Active Problem List   Diagnosis Date Noted   Atypical chest pain 11/26/2020   S/P kyphoplasty 06/15/2017  S/P total knee replacement using cement 12/08/2015   Atrial fibrillation ([place]) 11/19/2015   Anxiety and depression 07/09/2015   Varicose veins of bilateral lower extremities with other complications 85/04/7739   Essential hypertension, benign 12/08/2014   Acute hemorrhagic cystitis 02/18/2014   Stroke ([place]) 05/15/2012   Spinal stenosis    COPD (chronic obstructive pulmonary disease) ([place])    Long term current use of aromatase inhibitor 05/08/2012   Hx of radiation therapy    Breast cancer of upper-inner quadrant of right female breast ([place]) 12/23/2011   Ataxia 06/23/2011   Contact dermatitis and eczema due to plant 01/04/2011   BACK PAIN 08/11/2009   PALPITATIONS, RECURRENT 02/27/2009   PERS HX TOBACCO USE PRESENTING HAZARDS HEALTH 12/17/2008   Hyperlipidemia 02/26/2008   ASTHMA 02/26/2008   GERD 02/12/2007   REFERRING DIAG: N39.46 (ICD-10-CM) - Mixed incontinence   THERAPY DIAG:  Muscle weakness (generalized)   Other lack of coordination   Rationale for Evaluation and Treatment Rehabilitation    ONSET DATE: 08/30/2021   SUBJECTIVE:                                                                                                                                                                                            SUBJECTIVE STATEMENT:   I have had a EKG. I had pain in my shoulder blade. They are doing test on me.   PAIN:  Are you having pain? no NPRS scale: 0/10 Pain location:  pelvic floor     PRECAUTIONS: Other: had a stroke ; hearing deficits, Melanoma   WEIGHT BEARING RESTRICTIONS No   FALLS:  Has patient fallen in last 6 months? No   LIVING ENVIRONMENT: Lives with: lives alone   OCCUPATION: retired   PLOF: Independent   PATIENT GOALS stop the stool leakage to return to swimming and activities and not have to have surgery again   PERTINENT HISTORY:  Posterior repair, perineoplasty cystoscopy, urethral bulking, 08/30/2021; Breast Cancer 12/2011 right, ER+; HX of radiation therapy 03/12/12-04/13/12; Hypertension; IBS; COPD; Stroke; Kyphoplasty 06/15/2017; Transobturator sling 07/05/2005; Melanoma   BOWEL MOVEMENT Pain with bowel movement: No Type of bowel movement:Type (Bristol Stool Scale) 1, 3,4, 5, Frequency daily , and Strain No Fully empty rectum: Yes: 90% of the time  but if has Type 1 and will go again Leakage: Yes: comes our randomly Pads: Yes: 2-3 per week Fiber supplement: Yes: colace   URINATION Pain with urination: Yes when she does not have enough fluid Fully empty bladder: Yes:   Stream: Strong, she will urinate a second time.  Urgency: No Frequency: average Leakage:  when waiting too long  to go to the bathroom Pads: Yes: 2-3 per week       OBJECTIVE:    DIAGNOSTIC FINDINGS:  none   COGNITION:            Overall cognitive status: Impaired, expressive communication from stroke on the left side                        SENSATION:            Light touch: Appears intact            Proprioception: Appears intact                POSTURE: No  Significant postural limitations               PELVIC ALIGNMENT:correct alignment   LUMBARAROM/PROM   A/PROM A/PROM  eval  Flexion Decreased by 25%  Extension Decreased by 25%  Right lateral flexion Decreased by 25%  Left lateral flexion Decreased by 25%  Right rotation Decreased by 25%  Left rotation Decreased by 25%   (Blank rows = not tested)   LOWER EXTREMITY ROM:   Passive ROM Right eval Left eval  Hip external rotation 25 75   (Blank rows = not tested)   LOWER EXTREMITY MMT:   MMT Right eval Left eval  Hip extension 4/5 4/5  Hip abduction 4/5 4/5     PALPATION:   General  tenderness located above the umbilicus;                  External Perineal Exam tightness in the perineal body                             Internal Pelvic Floor tightness in the internal anal sphincter, along the puborectalis   Patient confirms identification and approves PT to assess internal pelvic floor and treatment Yes   PELVIC MMT:   MMT eval  Internal Anal Sphincter 2/5 1 sec  External Anal Sphincter 2/5 1 sec  Puborectalis 2/5 1 sec  (Blank rows = not tested)         TONE: good   PROLAPSE: none   TODAY'S TREATMENT  01/05/2022 Exercises: Stretches/mobility: Strengthening: dead bug with green band around the knees and holding 1# in each hand Bridge with shoulder flexion to shoulder height with 1# in each hand Clam with green band around knees and bil. Shoulder horizontal abduction holding 1# wt.  Supine shoulder press with 1# wt. In hands and hold knees apart against green band Supine elbow flexion with 1# wt and hold knees apart against green band.  Nustep level 3 for 5 inutes    12/15/2021 Neuromuscular re-education: Pelvic floor contraction training:all squeeze hold 5 sec and contract the anus Bridge with anal contraction and ball squeeze Supine hip abduction with red band 15x Supine marching with red band 30x Sitting pelvic floor contraction with therapist  giving tactile cues to the anus for a lift.     12/08/2021 Neuromuscular re-education: Pelvic floor contraction training:supine ball squeeze hold 5 sec and contract the anus Bridge with anal contraction and ball squeeze Supine hip abduction with red band 15x Supine marching with red band 30x    PATIENT EDUCATION:  01/05/2022 Education details: Access Code: [email or web address] Person educated: Patient Education method: Explanation, Demonstration, Tactile cues, Verbal cues, and Handouts Education comprehension: verbalized understanding, returned demonstration, verbal cues required, tactile cues required, and  needs further education     HOME EXERCISE PROGRAM: 01/05/2022  Access Code: [email or web address] URL: [email or web address] Date: 01/05/2022 Prepared by: Earlie Counts  Exercises - Sidelying Pelvic Floor Contraction with Self-Palpation  - 3 x daily - 7 x weekly - 1 sets - 5 reps - 3 sec hold - Supine Hip Adduction Isometric with Ball  - 1 x daily - 4 x weekly - 1 sets - 5 reps - 5 sec hold - Supine Bridge with Mini Swiss Ball Between Knees  - 1 x daily - 4 x weekly - 1 sets - 15 reps - Hooklying Isometric Clamshell  - 1 x daily - 3 x weekly - 1 sets - 15 reps - Seated Pelvic Floor Contraction  - 2 x daily - 7 x weekly - 1 sets - 10 reps - 5 sec hold - Dead Bug  - 1 x daily - 3 x weekly - 1 sets - 10 reps - Bridge on Foam Roll - Arms Raised  - 1 x daily - 3 x weekly - 1 sets - 10 reps - Supine Shoulder Press  - 1 x daily - 3 x weekly - 1 sets - 10 reps - Supine Elbow Flexion Extension AROM  - 1 x daily - 7 x weekly - 1 sets - 10 reps   ASSESSMENT:   CLINICAL IMPRESSION: Patient is a 81 y.o. female who was seen today for physical therapy treatment for mixed incontinence.  Patient is happier with her contracting the pelvic floor. She is taking medication that makes her constipated so she will have stool leakage. Stool leakage  is 30% better. She is able to isolate the anal contraction with  tactile cues from the therapist. Patient is having trouble with balance and waiting to see the neurologist. Patient has learned new exercises that are more challenging. She is going for more test for her pain in the back. Patient is now using a thinner pad and sometimes does not wear one due to decreased stool leakage.  Patient will benefit from skilled therapy to improve rectal strength to reduce her stool leakage.      OBJECTIVE IMPAIRMENTS decreased activity tolerance, decreased coordination, decreased endurance, decreased strength, increased fascial restrictions, and pain.    ACTIVITY LIMITATIONS continence and toileting   PARTICIPATION LIMITATIONS: community activity   PERSONAL FACTORS Age, Fitness, Time since onset of injury/illness/exacerbation, and 3+ comorbidities:    Posterior repair, perineoplasty cystoscopy, urethral bulking, 08/30/2021; Breast Cancer 12/2011 right, ER+; HX of radiation therapy 03/12/12-04/13/12; Hypertension; IBS; COPD; Stroke; Kyphoplasty 06/15/2017; are also affecting patient's functional outcome.    REHAB POTENTIAL: Excellent   CLINICAL DECISION MAKING: Stable/uncomplicated   EVALUATION COMPLEXITY: Low     GOALS: Goals reviewed with patient? Yes   SHORT TERM GOALS: Target date: 12/01/2021   Patient independent with diaphragmatic breathing to bulge the pelvic floor.  Baseline: Goal status: Met 12/08/2021   2.  Patient educated patient on correct toileting to fully empty her rectum.  Baseline:  Goal status: Met 12/08/2021     LONG TERM GOALS: Target date: 01/26/2022    Patient independent with pelvic floor strengthening to reduce her leakage.  Baseline:  Goal status: INITIAL   2.  Rectal strength is 3/5 with good lift of the rectum to reduce stool leakage.  Baseline:  Goal status: ongoing 01/05/2022   3.  Patient reports she is not having to wear a pad due to reduction on stool leakage.  Baseline:  Goal status: ongoing 01/05/2022  4.  Patient  reports her lower abdominal pain decreased </= 2/10 due to improved tissue restrictions.  Baseline:  Goal status: Met 12/08/2021       PLAN: PT FREQUENCY: 1x/week   PT DURATION: 12 weeks   PLANNED INTERVENTIONS: Therapeutic exercises, Therapeutic activity, Neuromuscular re-education, Patient/Family education, Self Care, Dry Needling, Electrical stimulation, Cryotherapy, Moist heat, Contrast bath, and Manual therapy   PLAN FOR NEXT SESSION: continue to progress HEP, nustep Earlie Counts, PT 01/05/22 2:04 PM

## 2022-01-05 NOTE — Patient Instructions (Signed)
Medication Instructions:  Decrease HCTZ 12.5 mg ( From 1 Tablet Twice Daily to 1 Tablet Daily). *If you need a refill on your cardiac medications before your next appointment, please call your pharmacy*   Lab Work: No Labs If you have labs (blood work) drawn today and your tests are completely normal, you will receive your results only by: [place] (if you have MyChart) OR A paper copy in the mail If you have any lab test that is abnormal or we need to change your treatment, we will call you to review the results.   Testing/Procedures: No Testing   Follow-Up: At [facility], you and your health needs are our priority.  As part of our continuing mission to provide you with exceptional heart care, we have created designated Provider Care Teams.  These Care Teams include your primary Cardiologist (physician) and Advanced Practice Providers (APPs -  Physician Assistants and Nurse Practitioners) who all work together to provide you with the care you need, when you need it.  We recommend signing up for the patient portal called "MyChart".  Sign up information is provided on this After Visit Summary.  MyChart is used to connect with patients for Virtual Visits (Telemedicine).  Patients are able to view lab/test results, encounter notes, upcoming appointments, etc.  Non-urgent messages can be sent to your provider as well.   To learn more about what you can do with MyChart, go to [email or web address].    Your next appointment:   3-4 week(s)  The format for your next appointment:   In Person  Provider:   Laurann Montana, NP

## 2022-01-12 ENCOUNTER — Ambulatory Visit: Payer: Medicare Other | Attending: Obstetrics and Gynecology | Admitting: Physical Therapy

## 2022-01-12 ENCOUNTER — Other Ambulatory Visit: Payer: Self-pay | Admitting: Adult Health

## 2022-01-12 DIAGNOSIS — R278 Other lack of coordination: Secondary | ICD-10-CM | POA: Insufficient documentation

## 2022-01-12 DIAGNOSIS — F419 Anxiety disorder, unspecified: Secondary | ICD-10-CM

## 2022-01-12 DIAGNOSIS — M6281 Muscle weakness (generalized): Secondary | ICD-10-CM | POA: Insufficient documentation

## 2022-01-19 ENCOUNTER — Other Ambulatory Visit: Payer: Self-pay | Admitting: Adult Health

## 2022-01-19 ENCOUNTER — Ambulatory Visit: Payer: Medicare Other | Admitting: Physical Therapy

## 2022-01-19 ENCOUNTER — Telehealth: Payer: Self-pay | Admitting: Physical Therapy

## 2022-01-19 NOTE — Telephone Encounter (Signed)
Called patient and she has not attended therapy due to back pain limiting her from getting out of her house. Waiting for MRI results. She is aware of her next appointment on 12/6. Earlie Counts, PT '@11'$ /10/2021@ 2:23 PM

## 2022-01-20 NOTE — Telephone Encounter (Signed)
Okay for refill?  

## 2022-01-25 ENCOUNTER — Ambulatory Visit (INDEPENDENT_AMBULATORY_CARE_PROVIDER_SITE_OTHER): Payer: Medicare Other | Admitting: Family

## 2022-01-25 ENCOUNTER — Encounter (HOSPITAL_BASED_OUTPATIENT_CLINIC_OR_DEPARTMENT_OTHER): Payer: Self-pay | Admitting: Family

## 2022-01-25 VITALS — BP 145/79 | HR 71 | Ht 63.5 in | Wt 137.1 lb

## 2022-01-25 DIAGNOSIS — I25118 Atherosclerotic heart disease of native coronary artery with other forms of angina pectoris: Secondary | ICD-10-CM | POA: Diagnosis not present

## 2022-01-25 DIAGNOSIS — Z8673 Personal history of transient ischemic attack (TIA), and cerebral infarction without residual deficits: Secondary | ICD-10-CM

## 2022-01-25 DIAGNOSIS — E785 Hyperlipidemia, unspecified: Secondary | ICD-10-CM

## 2022-01-25 DIAGNOSIS — Z86718 Personal history of other venous thrombosis and embolism: Secondary | ICD-10-CM

## 2022-01-25 DIAGNOSIS — I10 Essential (primary) hypertension: Secondary | ICD-10-CM | POA: Diagnosis not present

## 2022-01-25 MED ORDER — ISOSORBIDE MONONITRATE ER 30 MG PO TB24: 15.0000 mg | ORAL_TABLET | Freq: Every day | ORAL | 3 refills | Status: DC

## 2022-01-25 MED ORDER — SIMVASTATIN 10 MG PO TABS: 10.0000 mg | ORAL_TABLET | ORAL | 3 refills | Status: DC

## 2022-01-25 NOTE — Patient Instructions (Addendum)
Medication Instructions:  Your physician has recommended you make the following change in your medication:   REDUCE Simvastatin to '10mg'$  once per week.   We have sent in the lower strength tablet for you.   *If you need a refill on your cardiac medications before your next appointment, please call your pharmacy*   Lab Work/Testing/Procedures: None ordered today.    Follow-Up: At [facility], you and your health needs are our priority.  As part of our continuing mission to provide you with exceptional heart care, we have created designated Provider Care Teams.  These Care Teams include your primary Cardiologist (physician) and Advanced Practice Providers (APPs -  Physician Assistants and Nurse Practitioners) who all work together to provide you with the care you need, when you need it.  We recommend signing up for the patient portal called "MyChart".  Sign up information is provided on this After Visit Summary.  MyChart is used to connect with patients for Virtual Visits (Telemedicine).  Patients are able to view lab/test results, encounter notes, upcoming appointments, etc.  Non-urgent messages can be sent to your provider as well.   To learn more about what you can do with MyChart, go to [email or web address].    Your next appointment:   As scheduled with Dr. Oval Linsey  Other Instructions  Heart Healthy Diet Recommendations: A low-salt diet is recommended. Meats should be grilled, baked, or boiled. Avoid fried foods. Focus on lean protein sources like fish or chicken with vegetables and fruits. The American Heart Association is a Microbiologist!  American Heart Association Diet and Lifeystyle Recommendations   Exercise recommendations: The American Heart Association recommends 150 minutes of moderate intensity exercise weekly. Try 30 minutes of moderate intensity exercise 4-5 times per week. This could include walking, jogging, or swimming.  Gradually increase activity as  your back will tolerate  _______________________________________  For coronary artery disease often called "heart disease" we aim for optimal guideline directed medical therapy. We use the "A, B, C"s to help keep [place] on track!  A = Antiplatelet - this is your Plavix B = Blood pressure control C = Cholesterol control. You take Simvastatin and Repatha to help control your cholesterol.  D = Don't forget nitroglycerin! This is an emergency tablet to be used if you have chest pain. E = Extras. In your case, this is Isosorbide Mononitrate    Important Information About Sugar

## 2022-01-25 NOTE — Progress Notes (Signed)
Office Visit    Patient Name: Alexandria [name] Date of Encounter: 01/25/2022  PCP:  Alexandria Peng, NP   Alexandria [name]  Cardiologist:  Alexandria Latch, MD  Advanced Practice Provider:  No care team member to display Electrophysiologist:  None      Chief Complaint    Alexandria [name] is a 81 y.o. 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 since age 37   IBS (irritable bowel syndrome)    Long term current use of aromatase inhibitor 05/08/2012   Overactive bladder    Palpitations    Pneumonia    hx of x 2    PONV (postoperative nausea and vomiting)    PVD (peripheral vascular disease) ([place])    varicose veins - left worse than right   Spinal stenosis    Stress incontinence    Stroke [facility])    problem with expressive communicaiton   Traumatic hemopneumothorax 1982   left   Past Surgical History:  Procedure Laterality Date   ANKLE HARDWARE REMOVAL     right   BREAST BIOPSY  1976   right   BREAST BIOPSY Left 2019   benign lymph node   BREAST LUMPECTOMY  2013   right with snbx   CHOLECYSTECTOMY  1990s   COLONOSCOPY     CYSTOSCOPY   07/05/2005   KNEE SURGERY  1980s   left   KYPHOPLASTY N/A 06/15/2017   Procedure: KYPHOPLASTY T11;  Surgeon: Alexandria Schools, MD;  Location: [place];  Service: Orthopedics;  Laterality: N/A;  90 mins   ORIF ANKLE FRACTURE  1980s   right    TOTAL KNEE ARTHROPLASTY Left 12/08/2015   Procedure: LEFT TOTAL KNEE ARTHROPLASTY;  Surgeon: Alexandria Cancel, MD;  Location: [facility];  Service: Orthopedics;  Laterality: Left;   TRANSOBTURATOR SLING  07/05/2005   TUBAL LIGATION  1976   VEIN SURGERY     vein ablation left leg    Allergies  Allergies  Allergen Reactions   Augmentin [Amoxicillin-Pot Clavulanate] Itching, Rash and Other (See Comments)    PT DEVELOPED SEVERE RASH INVOLVING MUCUS MEMBRANES or SKIN NECROSIS WITH PENICILLINS: #  #  #  YES  #  #  #     Penicillins Hives and Other (See Comments)    Has patient had a PCN reaction causing immediate rash, facial/tongue/throat swelling, SOB or lightheadedness with hypotension: No HAS PT DEVELOPED SEVERE  RASH INVOLVING MUCUS MEMBRANES or SKIN NECROSIS: #  #  #  YES  #  #  #   Has patient had a PCN reaction that required hospitalization: No Has patient had a PCN reaction occurring within the last 10 years: No    Aspirin Hives   Chlorhexidine Gluconate Rash   Chocolate Hives and Other (See Comments)    RUNNY NOSE    Coffee Bean Extract Hives and Other (See Comments)    RUNNY NOSE    Diclofenac Other (See Comments)   Ibuprofen Hives and Other (See Comments)    RUNNY NOSE   Oxycodone Hcl Hives and Nausea And Vomiting   Shrimp [Shellfish Allergy] Hives and Other (See Comments)    RUNNY NOSE    Sulfonamide Derivatives Hives   Lipitor [Atorvastatin Calcium]     Leg Pain/Joint Pain/Back Pain   Methocarbamol Other (See Comments)    History of Present Illness    Alexandria [name] is a 80 y.o. female with a hx of CAD, DVT, anemia, arthritis, anxiety, asthma, acute brainstem infarct, right breast cancer s/p radiation therapy, COPD, DJD, depression, atrial  fibrillation, GERD, hearing loss, glomerulonephritis, hyperlipidemia, hypertension, IBS, pneumonia, PVD (varicose veins), spinal stenosis, left traumatic hemopneumothorax last seen 01/05/22  Prior stress test 2010 with EF 82%, no ischemia nor infarct. Previously evaluated in 2014 by Dr. Percival [name] after diagnosis of stroke -due to palpitations atenolol was transitioned to Toprol.  Echocardiogram was recommended and performed 05/2012 revealing EF 60%, no RWMA, no cardiac source of emboli.  Myoview 2014 with no evidence of ischemia nor infarct.  Evaluated 11/26/2020 after referral from PCP for evaluation of hypertension.  Noted prior to a scheduled posterior vaginal wall repair 05/31/2020 she had mechanical fall.  Twisted right ankle and fell flat in her driveway.  Subsequently developed 3 small thrombi above the ventricles in her posterior head.  Another fall where she fell backwards on concrete and fractured T11 vertebrae.  Then tore her left rotator cuff.  Posterior vaginal wall repair was never able to be completed due to these medical issues but she was inclined to have that surgery.  She admitted chest pain which she attributed to GERD.  However also with exertional component and was recommended for ischemic evaluation. She did not previous muscle cramps with Atorvastatin. Cardiac CTA 11/2020 coronary calcium score of 71 placing her in the 47th percentile for age and sex matched control (RCA 25-49%, proximal LAD 0-24%, proximal large first diagonal 25-49%).  FFR with no significant stenosis.  At clinic visit 07/05/2021 her simvastatin was reduced to twice per week due to myalgias.  She was started on Repatha.  Repeat lipid panel 11/02/2020 AST 15, ALT 11, LDL 66.  Seen 11/04/21 with no anginal symptoms. Simvastatin was reduced due to myalgias.   Seen 01/05/22. Chest pain relieved by Imdur but complicated by headache. Given her chest discomfort had resolved with Imdur/Amlodipine, cardiac catheterization was  deferred. HCTZ reduced to 12.'5mg'$  daily.   She presents today for follow-up.  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 She is seeing Dr. Rolena [name] tomorrow due to back pain that is limiting her ADLs. She had MRI on Sunday for evaluation and is hopeful that she will get some answers at her upcoming appointment with Dr. Rolena [name]. Has had lots of family members with illness, surgeries including her daughter with breast cancer. No recurrent chest discomfort. No significant exertional dyspnea. B pat home at rest 120s and with pain  140s.    EKGs/Labs/Other Studies Reviewed:   The following studies were reviewed today:  Cardiac CTA 12/04/2018  Aorta:  Normal size.  Mild calcifications.  No dissection.   Aortic Valve: No calcifications.   Coronary Arteries:  Normal coronary origin.  Right dominance.   RCA is a large dominant artery that gives rise to PDA and PLA. There is proximal calcified plaque, 25-49% stenosis.   Left main is a large artery that gives rise to LAD and LCX arteries.   LAD is a large vessel that has proximal calcified plaque, 0-24% stenosis, proximal large first diagonal calcified plaque, 25-49% stenosis.   LCX is a non-dominant artery that gives rise to one large OM1 branch. There is no plaque.   Other findings:   Normal pulmonary vein drainage into the left atrium.   Normal left atrial appendage without a thrombus.   Normal size of the pulmonary artery.   Please see radiology report for non cardiac findings.   IMPRESSION: 1. Coronary calcium score of 71 (LAD 50, RCA 21). This was 42 percentile for age and sex matched control.   2. Normal coronary origin with right dominance.   3. Moderate CAD in LAD and RCA distribution. Will send for FFR analysis.   4.  Aortic atherosclerosis.   CAD-RADS 2. Mild non-obstructive CAD (25-49%). Consider non-atherosclerotic causes of chest pain. Consider preventive therapy and risk factor  modification.  FFR 1. Left Main: No significant stenosis.   2. LAD: No significant stenosis. 3. LCX: No significant stenosis. 4. RCA: No significant stenosis.   IMPRESSION: 1.  CT FFR analysis didn't show any significant stenosis.   LE Venous Reflux 03/04/2020: Summary:  Right:  - No evidence of deep vein thrombosis from the common femoral through the  popliteal veins.  - No evidence of superficial vein thrombosis.  - Popliteal cyst observed.  - The common femoral vein is incompetent.  - The great and small saphenous veins were not visualized, consistent with  history of ablation.     Left:  - No evidence of deep vein thrombosis from the common femoral through the  popliteal veins.  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  - No true great saphenous vein identified.  - The small saphenous vein is not competent.    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  - Findings consistent with age indeterminate superficial vein thrombosis  involving the right superficial veins/varciosities.  - There is no evidence of deep vein thrombosis in the lower extremity.     - No cystic structure found in the popliteal fossa.   EKG: No EKG today  Recent Labs: 02/16/2021: TSH 2.24 11/02/2021: ALT 11; BUN 24; Creatinine, Ser 0.95; Potassium 4.3; Sodium 143 12/03/2021: Hemoglobin 12.1; Platelets 213.0  Recent Lipid Panel    Component Value Date/Time   CHOL 156 11/02/2021 0826   TRIG 102 11/02/2021 0826   HDL 72 11/02/2021 0826   CHOLHDL 2.2 11/02/2021 0826   CHOLHDL 5 07/10/2019 1441   VLDL 36.6 07/10/2019 1441   LDLCALC 66 11/02/2021 0826   LDLDIRECT 183.5 12/17/2008 0953    Home Medications   Current Meds  Medication Sig   albuterol (PROAIR HFA) 108 (90 Base) MCG/ACT inhaler Inhale 2 puffs into the lungs every 6 (six)  hours as needed for wheezing or shortness of breath.   amLODipine (NORVASC) 10 MG  tablet TAKE 1 TABLET EACH DAY.   ARNICA EX Apply 1 application topically daily as needed (pain).   b complex vitamins tablet Take 1 tablet by mouth 2 (two) times daily. PLUS FOLIC ACID PLUS VIT. C   Biotin 5000 MCG CAPS Take 5,000 mcg by mouth daily.    Capsaicin 0.025 % PADS Apply 1 application topically daily as needed (pain).   Cholecalciferol (VITAMIN D-3) 5000 UNITS TABS Take 5,000 Units by mouth daily.    clopidogrel (PLAVIX) 75 MG tablet TAKE 1 TABLET BY MOUTH DAILY.   CO-ENZYME Q10-VITAMIN E PO Take 1 tablet by mouth daily.   Diclofenac Sodium (PENNSAID) 2 % SOLN Apply 1 application topically 3 (three) times daily. (Patient taking differently: Apply 1 application  topically as needed.)   diphenhydrAMINE (BENADRYL) 25 MG tablet Take 25 mg by mouth every 6 (six) hours as needed. Patient taking 1/2 (12.5 mg) as needed for allergies and itching   escitalopram (LEXAPRO) 10 MG tablet TAKE ONE TABLET BY MOUTH ONCE DAILY   Evolocumab (REPATHA SURECLICK) 676 MG/ML SOAJ Inject 140 mg into the skin every 14 (fourteen) days.   fluticasone (FLONASE) 50 MCG/ACT nasal spray USE 1 SPRAY IN EACH NOSTRIL DAILY.   fluticasone-salmeterol (ADVAIR HFA) 230-21 MCG/ACT inhaler Inhale 2 puffs into the lungs 2 (two) times daily.   hydrochlorothiazide (MICROZIDE) 12.5 MG capsule Take 12.5 mg by mouth daily. Take 1 Tablet Daily   ipratropium (ATROVENT) 0.03 % nasal spray Place 2 sprays into both nostrils every 12 (twelve) hours.   isosorbide mononitrate (IMDUR) 30 MG 24 hr tablet Take 1/2 tablet at bedtime   loratadine (CLARITIN) 10 MG tablet Take 10 mg by mouth daily as needed for allergies.   Misc Natural Products (OSTEO BI-FLEX ADV TRIPLE ST PO) Take 1 tablet by mouth daily.   montelukast (SINGULAIR) 10 MG tablet Take 1 tablet (10 mg total) by mouth at bedtime.   nitroGLYCERIN (NITROSTAT) 0.4 MG SL tablet Place 1 tablet (0.4 mg  total) under the tongue every 5 (five) minutes as needed for chest pain.   ondansetron (ZOFRAN-ODT) 4 MG disintegrating tablet DISSOLVE 1 TABLET ON TONGUE EVERY 8 HOURS AS NEEDED FOR NAUSEA/VOMITING.   pantoprazole (PROTONIX) 40 MG tablet TAKE ONE TABLET BY MOUTH DAILY   Polyethyl Glycol-Propyl Glycol (SYSTANE OP) Place 1 drop into both eyes daily as needed. Dry eyes   potassium chloride (KLOR-CON) 10 MEQ tablet Take 2 tablets (20 mEq total) by mouth 2 (two) times daily.   Probiotic Product (ALIGN PO) Take 1 tablet by mouth daily.   Selenium 200 MCG CAPS Take 200 mcg by mouth 2 (two) times daily.    simvastatin (ZOCOR) 20 MG tablet Take 0.5 tablets (10 mg total) by mouth 2 (two) times a week.   traMADol (ULTRAM) 50 MG tablet Take 1 tablet (50 mg total) by mouth every 8 (eight) hours as needed for up to 5 days.   Trolamine Salicylate (ASPERCREME EX) Apply 1 application topically daily as needed (pain).     Review of Systems      All other systems reviewed and are otherwise negative except as noted above.  Physical Exam    VS:  BP (!) 145/79 (BP Location: Left Arm, Patient Position: Sitting, Cuff Size: Normal)   Pulse 71   Ht 5' 3.5" (1.613 m)   Wt 137 lb 1.6 oz (62.2 kg)   SpO2 97%   BMI 23.91 kg/m  , BMI Body mass index is 23.91 kg/m.  Wt Readings from  Last 3 Encounters:  01/25/22 137 lb 1.6 oz (62.2 kg)  01/05/22 137 lb 6.4 oz (62.3 kg)  12/03/21 139 lb (63 kg)    GEN: Well nourished, well developed, in no acute distress. HEENT: normal. Neck: Supple, no JVD, carotid bruits, or masses. Cardiac: RRR, no murmurs, rubs, or gallops. No clubbing, cyanosis, edema.  Radials/PT 2+ and equal bilaterally.  Respiratory:  Respirations regular and unlabored, clear to auscultation bilaterally. GI: Soft, nontender, nondistended. MS: No deformity or atrophy. Skin: Warm and dry, no rash. Neuro:  Strength and sensation are intact. Psych: Normal affect.  Assessment & Plan   CAD - Moderate  nonobstructive disease by cardiac CTA 11/2020 with no evidence of significant stenosis by FFR. Stable with no anginal symptoms. No indication for ischemic evaluation.  GDMT includes Plavix, simvastatin, Repatha, PRN nitroglycerin.  Heart healthy diet and regular cardiovascular exercise encouraged.    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 11/2020 LE duplex no DVT.  No indication for anticoagulation.  HLD, LDL goal <70 - Did not tolerate Atorvastatin with myalgias. Did not tolerate Simvasatin at daily dose and still with myalgias twice weekly - will reduce to daily. Repatha initiated with repeat labs 11/02/21 LDL 66. Continue same.   HTN - BP elevated in clinic associated with back pain and has upcoming visit with Dr. Rolena [name] for evaluation..  BP at home routinely 120s with pain or activity will be as high as 140s. Systolic routinely in the 60s. Continue Amlodipine '10mg'$  daily, Imdur '15mg'$  QHS.   Hx of CVA - Continue Aspirin, statin, Repatha.    Disposition: Follow up  as scheduled  with Alexandria Latch, MD or APP.  Signed, Loel Dubonnet, NP 01/25/2022, 1:53 PM [facility]

## 2022-02-16 ENCOUNTER — Ambulatory Visit: Payer: Medicare Other | Attending: Obstetrics and Gynecology | Admitting: Physical Therapy

## 2022-02-16 ENCOUNTER — Encounter: Payer: Self-pay | Admitting: Physical Therapy

## 2022-02-16 DIAGNOSIS — M6281 Muscle weakness (generalized): Secondary | ICD-10-CM | POA: Diagnosis present

## 2022-02-16 DIAGNOSIS — R278 Other lack of coordination: Secondary | ICD-10-CM | POA: Diagnosis present

## 2022-02-16 NOTE — Therapy (Signed)
OUTPATIENT PHYSICAL THERAPY TREATMENT NOTE   Patient Name: Alexandria [name] MRN: [id number] DOB:[birth year], 81 y.o., female Today's Date: 02/16/2022  PCP:  [name], NP  REFERRING PROVIDER:  Damian Leavell, MD   END OF SESSION:   PT End of Session - 02/16/22 1535     Visit Number 5    Date for PT Re-Evaluation 04/20/21    Authorization Type [employer] medicare    Authorization - Visit Number 5    Authorization - Number of Visits 10    PT Start Time 6440    PT Stop Time 1610    PT Time Calculation (min) 40 min    Activity Tolerance Patient tolerated treatment well    Behavior During Therapy WFL for tasks assessed/performed             Past Medical History:  Diagnosis Date   Allergy    Anemia    hx of    Anxiety    Arthritis    left knee, hands, back   Asthma    daily and prn inhalers   Atypical chest pain 11/26/2020   Brainstem infarct, 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 since age 8   IBS (irritable bowel syndrome)    Long term current use of aromatase inhibitor 05/08/2012   Overactive bladder    Palpitations    Pneumonia    hx of x 2    PONV (postoperative nausea and vomiting)    PVD (peripheral vascular disease) (HCC)    varicose veins - left worse than right   Spinal stenosis    Stress incontinence    Stroke [facility])    problem with expressive communicaiton   Traumatic hemopneumothorax 1982   left   Past  Surgical History:  Procedure Laterality Date   ANKLE HARDWARE REMOVAL     right   BREAST BIOPSY  1976   right   BREAST BIOPSY Left 2019   benign lymph node   BREAST LUMPECTOMY  2013   right with snbx   CHOLECYSTECTOMY  1990s   COLONOSCOPY     CYSTOSCOPY  07/05/2005   KNEE SURGERY  1980s   left   KYPHOPLASTY N/A 06/15/2017   Procedure: KYPHOPLASTY T11;  Surgeon: Melina Schools, MD;  Location: [facility];  Service: Orthopedics;  Laterality: N/A;  90 mins   ORIF ANKLE FRACTURE  1980s   right    TOTAL KNEE ARTHROPLASTY Left 12/08/2015   Procedure: LEFT TOTAL KNEE ARTHROPLASTY;  Surgeon: Paralee Cancel, MD;  Location: [facility];  Service: Orthopedics;  Laterality: Left;   TRANSOBTURATOR SLING  07/05/2005   TUBAL LIGATION  1976   VEIN SURGERY     vein ablation left leg   Patient Active Problem List   Diagnosis Date Noted   Atypical chest pain 11/26/2020   S/P  kyphoplasty 06/15/2017   S/P total knee replacement using cement 12/08/2015   Atrial fibrillation ([place]) 11/19/2015   Anxiety and depression 07/09/2015   Varicose veins of bilateral lower extremities with other complications 09/38/1829   Essential hypertension, benign 12/08/2014   Acute hemorrhagic cystitis 02/18/2014   Stroke ([place]) 05/15/2012   Spinal stenosis    COPD (chronic obstructive pulmonary disease) ([place])    Long term current use of aromatase inhibitor 05/08/2012   Hx of radiation therapy    Breast cancer of upper-inner quadrant of right female breast ([place]) 12/23/2011   Ataxia 06/23/2011   Contact dermatitis and eczema due to plant 01/04/2011   BACK PAIN 08/11/2009   PALPITATIONS, RECURRENT 02/27/2009   PERS HX TOBACCO USE PRESENTING HAZARDS HEALTH 12/17/2008   Hyperlipidemia 02/26/2008   ASTHMA 02/26/2008   GERD 02/12/2007   REFERRING DIAG: N39.46 (ICD-10-CM) - Mixed incontinence   THERAPY DIAG:  Muscle weakness (generalized)   Other lack of coordination   Rationale for Evaluation and Treatment Rehabilitation    ONSET DATE: 08/30/2021   SUBJECTIVE:                                                                                                                                                                                            SUBJECTIVE STATEMENT:   I have not been to therapy due to my back hurting and upset stomach.  Was constipated. I had red blood with my bowel movement. I still get stool on my underwear. No urinary leakage.    PAIN:  Are you having pain? no NPRS scale: 0/10 Pain location:  pelvic floor     PRECAUTIONS: Other: had a stroke ; hearing deficits, Melanoma   WEIGHT BEARING RESTRICTIONS No   FALLS:  Has patient fallen in last 6 months? No   LIVING ENVIRONMENT: Lives with: lives alone   OCCUPATION: retired   PLOF: Independent   PATIENT GOALS stop the stool leakage to return to swimming and activities and not have to have surgery again   PERTINENT HISTORY:  Posterior repair, perineoplasty cystoscopy, urethral bulking, 08/30/2021; Breast Cancer 12/2011 right, ER+; HX of radiation therapy 03/12/12-04/13/12; Hypertension; IBS; COPD; Stroke; Kyphoplasty 06/15/2017; Transobturator sling 07/05/2005; Melanoma   BOWEL MOVEMENT Pain with bowel movement: No Type of bowel movement:Type (Bristol Stool Scale) 1, 3,4, 5, Frequency daily , and Strain No Fully empty rectum: Yes: 90% of the time  but if has Type 1 and will go again Leakage: Yes: comes our randomly Pads: Yes: 2-3 per week Fiber supplement: Yes: colace   URINATION Pain with urination: Yes when she does not have enough fluid Fully empty bladder:  Yes:   Stream: Strong, she will urinate a second time.  Urgency: No Frequency: average Leakage:  when waiting too long to go to the bathroom Pads: Yes: 2-3 per week       OBJECTIVE:    DIAGNOSTIC FINDINGS:  none   COGNITION:            Overall cognitive status: Impaired, expressive communication from stroke on the left side                        SENSATION:             Light touch: Appears intact            Proprioception: Appears intact                POSTURE: No Significant postural limitations               PELVIC ALIGNMENT:correct alignment   LUMBARAROM/PROM   A/PROM A/PROM  eval  Flexion Decreased by 25%  Extension Decreased by 25%  Right lateral flexion Decreased by 25%  Left lateral flexion Decreased by 25%  Right rotation Decreased by 25%  Left rotation Decreased by 25%   (Blank rows = not tested)   LOWER EXTREMITY ROM:   Passive ROM Right eval Left eval  Hip external rotation 25 75   (Blank rows = not tested)   LOWER EXTREMITY MMT:   MMT Right eval Left eval Right 02/16/22 Left  02/16/2022  Hip extension 4/5 4/5 4/5 4/5  Hip abduction 4/5 4/5 4/5 4/5     PALPATION:                 External Perineal Exam tightness in the perineal body                             Internal Pelvic Floor tightness in the internal anal sphincter, along the puborectalis, puborectalis does not come forward   Patient confirms identification and approves PT to assess internal pelvic floor and treatment Yes   PELVIC MMT:   MMT eval 02/16/2022  Internal Anal Sphincter 2/5 1 sec 2/5 40 sec  External Anal Sphincter 2/5 1 sec 2/5 40 sec  Puborectalis 2/5 1 sec 2/5 40 sec  (Blank rows = not tested)         TONE: good   PROLAPSE: none   TODAY'S TREATMENT  02/16/2022 Manual: Internal pelvic floor techniques:No emotional/communication barriers or cognitive limitation. Patient is motivated to learn. Patient understands and agrees with treatment goals and plan. PT explains patient will be examined in standing, sitting, and lying down to see how their muscles and joints work. When they are ready, they will be asked to remove their underwear so PT can examine their perineum. The patient is also given the option of providing their own chaperone as one is not provided in our facility. The patient also has the right and is explained the right to defer  or refuse any part of the evaluation or treatment including the internal exam. With the patient's consent, PT will use one gloved finger to gently assess the muscles of the pelvic floor, seeing how well it contracts and relaxes and if there is muscle symmetry. After, the patient will get dressed and PT and patient will discuss exam findings and plan of care. PT and patient discuss plan of care, schedule, attendance policy and HEP activities. Going through the  rectum working on the puborectalis, along the anococcyxgeal ligament, along the EAS and IAS to lengthen the muscles for a better contraction Neuromuscular re-education: Pelvic floor contraction training: Using tactile cues to the rectum to contract the EAS and IAS with a circular contraction and lift, contraction 40 sec Using tactile cues to have the puborectalis come forward  Exercises: Strengthening: Nustep level 5 for 5 minutes while therapist is assessing patient 01/05/2022 Exercises: Stretches/mobility: Strengthening: dead bug with green band around the knees and holding 1# in each hand Bridge with shoulder flexion to shoulder height with 1# in each hand Clam with green band around knees and bil. Shoulder horizontal abduction holding 1# wt.  Supine shoulder press with 1# wt. In hands and hold knees apart against green band Supine elbow flexion with 1# wt and hold knees apart against green band.  Nustep level 3 for 5 inutes     12/15/2021 Neuromuscular re-education: Pelvic floor contraction training:all squeeze hold 5 sec and contract the anus Bridge with anal contraction and ball squeeze Supine hip abduction with red band 15x Supine marching with red band 30x Sitting pelvic floor contraction with therapist giving tactile cues to the anus for a lift.        PATIENT EDUCATION:  01/05/2022 Education details: Access Code: [email or web address] ,work on anal contraction holding 40 sec Person educated: Patient Education method: Consulting civil engineer,  Media planner, Corporate treasurer cues, Verbal cues, and Handouts Education comprehension: verbalized understanding, returned demonstration, verbal cues required, tactile cues required, and needs further education     HOME EXERCISE PROGRAM: 01/05/2022  Access Code: [email or web address] URL: [email or web address] Date: 01/05/2022 Prepared by: Earlie Counts   Exercises - Sidelying Pelvic Floor Contraction with Self-Palpation  - 3 x daily - 7 x weekly - 1 sets - 5 reps - 3 sec hold - Supine Hip Adduction Isometric with Ball  - 1 x daily - 4 x weekly - 1 sets - 5 reps - 5 sec hold - Supine Bridge with Mini Swiss Ball Between Knees  - 1 x daily - 4 x weekly - 1 sets - 15 reps - Hooklying Isometric Clamshell  - 1 x daily - 3 x weekly - 1 sets - 15 reps - Seated Pelvic Floor Contraction  - 2 x daily - 7 x weekly - 1 sets - 10 reps - 5 sec hold - Dead Bug  - 1 x daily - 3 x weekly - 1 sets - 10 reps - Bridge on Foam Roll - Arms Raised  - 1 x daily - 3 x weekly - 1 sets - 10 reps - Supine Shoulder Press  - 1 x daily - 3 x weekly - 1 sets - 10 reps - Supine Elbow Flexion Extension AROM  - 1 x daily - 7 x weekly - 1 sets - 10 reps   ASSESSMENT:   CLINICAL IMPRESSION: Patient is a 81 y.o. female who was seen today for physical therapy treatment for mixed incontinence.  Patient has not been to therapy due to her back pain and getting treatment. Pelvic floor strength is 2/5. The puborectalis does not go forward with contraction. Patient has difficulty with anal contraction due to her trying to contract the gluteal. Patient is not having urinary leakage. She will leak stool and has to wear a pad.   Patient will benefit from skilled therapy to improve rectal strength to reduce her stool leakage.      OBJECTIVE IMPAIRMENTS decreased activity tolerance, decreased coordination, decreased endurance, decreased strength, increased fascial restrictions,  and pain.    ACTIVITY LIMITATIONS continence and toileting    PARTICIPATION LIMITATIONS: community activity   PERSONAL FACTORS Age, Fitness, Time since onset of injury/illness/exacerbation, and 3+ comorbidities:    Posterior repair, perineoplasty cystoscopy, urethral bulking, 08/30/2021; Breast Cancer 12/2011 right, ER+; HX of radiation therapy 03/12/12-04/13/12; Hypertension; IBS; COPD; Stroke; Kyphoplasty 06/15/2017; are also affecting patient's functional outcome.    REHAB POTENTIAL: Excellent   CLINICAL DECISION MAKING: Stable/uncomplicated   EVALUATION COMPLEXITY: Low     GOALS: Goals reviewed with patient? Yes   SHORT TERM GOALS: Target date: 12/01/2021   Patient independent with diaphragmatic breathing to bulge the pelvic floor.  Baseline: Goal status: Met 12/08/2021   2.  Patient educated patient on correct toileting to fully empty her rectum.  Baseline:  Goal status: Met 12/08/2021     LONG TERM GOALS: Target date: 04/20/2022    Patient independent with pelvic floor strengthening to reduce her leakage.  Baseline:  Goal status: ongoing 02/17/2023   2.  Rectal strength is 3/5 with good lift of the rectum to reduce stool leakage.  Baseline:  Goal status: ongoing 02/16/2022   3.  Patient reports she is not having to wear a pad due to reduction on stool leakage.  Baseline:  Goal status: ongoing 02/16/2022   4.  Patient reports her lower abdominal pain decreased </= 2/10 due to improved tissue restrictions.  Baseline:  Goal status: Met 12/08/2021       PLAN: PT FREQUENCY: 1x/week   PT DURATION: 12 weeks   PLANNED INTERVENTIONS: Therapeutic exercises, Therapeutic activity, Neuromuscular re-education, Patient/Family education, Self Care, Dry Needling, Electrical stimulation, Cryotherapy, Moist heat, Contrast bath, and Manual therapy   PLAN FOR NEXT SESSION: continue to progress HEP, nustep, working on the anal contraction  Earlie Counts, PT 02/16/22 4:14 PM

## 2022-02-23 ENCOUNTER — Encounter: Payer: Self-pay | Admitting: Physical Therapy

## 2022-02-23 ENCOUNTER — Ambulatory Visit: Payer: Medicare Other | Admitting: Physical Therapy

## 2022-02-23 DIAGNOSIS — M6281 Muscle weakness (generalized): Secondary | ICD-10-CM | POA: Diagnosis not present

## 2022-02-23 DIAGNOSIS — R278 Other lack of coordination: Secondary | ICD-10-CM

## 2022-02-23 NOTE — Therapy (Addendum)
OUTPATIENT PHYSICAL THERAPY TREATMENT NOTE   Patient Name: Alexandria [name] MRN: [id number] DOB:1940/10/03, 81 y.o., female Today's Date: 02/23/2022  PCP: [name], NP  REFERRING PROVIDER: Damian Leavell, MD    END OF SESSION:   PT End of Session - 02/23/22 1236     Visit Number 6    Date for PT Re-Evaluation 04/20/21    Authorization Type [employer] medicare    Authorization - Visit Number 6    Authorization - Number of Visits 10    PT Start Time 4098    PT Stop Time 1310    PT Time Calculation (min) 40 min    Activity Tolerance Patient tolerated treatment well    Behavior During Therapy WFL for tasks assessed/performed             Past Medical History:  Diagnosis Date   Allergy    Anemia    hx of    Anxiety    Arthritis    left knee, hands, back   Asthma    daily and prn inhalers   Atypical chest pain 11/26/2020   Brainstem infarct, 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 since age 28   IBS (irritable bowel syndrome)    Long term current use of aromatase inhibitor 05/08/2012   Overactive bladder    Palpitations    Pneumonia    hx of x 2    PONV (postoperative nausea and vomiting)    PVD (peripheral vascular disease) (HCC)    varicose veins - left worse than right   Spinal stenosis    Stress incontinence    Stroke [facility])    problem with expressive communicaiton   Traumatic hemopneumothorax 1982   left   Past  Surgical History:  Procedure Laterality Date   ANKLE HARDWARE REMOVAL     right   BREAST BIOPSY  1976   right   BREAST BIOPSY Left 2019   benign lymph node   BREAST LUMPECTOMY  2013   right with snbx   CHOLECYSTECTOMY  1990s   COLONOSCOPY     CYSTOSCOPY  07/05/2005   KNEE SURGERY  1980s   left   KYPHOPLASTY N/A 06/15/2017   Procedure: KYPHOPLASTY T11;  Surgeon: Melina Schools, MD;  Location: [place];  Service: Orthopedics;  Laterality: N/A;  90 mins   ORIF ANKLE FRACTURE  1980s   right    TOTAL KNEE ARTHROPLASTY Left 12/08/2015   Procedure: LEFT TOTAL KNEE ARTHROPLASTY;  Surgeon: Paralee Cancel, MD;  Location: [facility];  Service: Orthopedics;  Laterality: Left;   TRANSOBTURATOR SLING  07/05/2005   TUBAL LIGATION  1976   VEIN SURGERY     vein ablation left leg   Patient Active Problem List   Diagnosis Date Noted   Atypical chest pain 11/26/2020   S/P kyphoplasty  06/15/2017   S/P total knee replacement using cement 12/08/2015   Atrial fibrillation ([place]) 11/19/2015   Anxiety and depression 07/09/2015   Varicose veins of bilateral lower extremities with other complications 40/98/1191   Essential hypertension, benign 12/08/2014   Acute hemorrhagic cystitis 02/18/2014   Stroke ([place]) 05/15/2012   Spinal stenosis    COPD (chronic obstructive pulmonary disease) ([place])    Long term current use of aromatase inhibitor 05/08/2012   Hx of radiation therapy    Breast cancer of upper-inner quadrant of right female breast ([place]) 12/23/2011   Ataxia 06/23/2011   Contact dermatitis and eczema due to plant 01/04/2011   BACK PAIN 08/11/2009   PALPITATIONS, RECURRENT 02/27/2009   PERS HX TOBACCO USE PRESENTING HAZARDS HEALTH 12/17/2008   Hyperlipidemia 02/26/2008   ASTHMA 02/26/2008   GERD 02/12/2007   REFERRING DIAG: N39.46 (ICD-10-CM) - Mixed incontinence   THERAPY DIAG:  Muscle weakness (generalized)   Other lack of coordination   Rationale for Evaluation and Treatment Rehabilitation    ONSET DATE: 08/30/2021   SUBJECTIVE:                                                                                                                                                                                            SUBJECTIVE STATEMENT:   I am sore from back therapy I had yesterday and not able to sleep.     PAIN:  Are you having pain? no NPRS scale: 0/10 Pain location:  pelvic floor     PRECAUTIONS: Other: had a stroke ; hearing deficits, Melanoma   WEIGHT BEARING RESTRICTIONS No   FALLS:  Has patient fallen in last 6 months? No   LIVING ENVIRONMENT: Lives with: lives alone   OCCUPATION: retired   PLOF: Independent   PATIENT GOALS stop the stool leakage to return to swimming and activities and not have to have surgery again   PERTINENT HISTORY:  Posterior repair, perineoplasty cystoscopy, urethral bulking, 08/30/2021; Breast Cancer 12/2011 right, ER+; HX of radiation therapy 03/12/12-04/13/12; Hypertension; IBS; COPD; Stroke; Kyphoplasty 06/15/2017; Transobturator sling 07/05/2005; Melanoma   BOWEL MOVEMENT Pain with bowel movement: No Type of bowel movement:Type (Bristol Stool Scale) 1, 3,4, 5, Frequency daily , and Strain No Fully empty rectum: Yes: 90% of the time  but if has Type 1 and will go again Leakage: Yes: comes our randomly Pads: Yes: 2-3 per week Fiber supplement: Yes: colace   URINATION Pain with urination: Yes when she does not have enough fluid Fully empty bladder: Yes:   Stream: Strong, she will urinate a second time.  Urgency: No Frequency: average Leakage:  when waiting too  long to go to the bathroom Pads: Yes: 2-3 per week       OBJECTIVE:    DIAGNOSTIC FINDINGS:  none   COGNITION:            Overall cognitive status: Impaired, expressive communication from stroke on the left side                        SENSATION:            Light touch: Appears intact            Proprioception: Appears intact                POSTURE: No  Significant postural limitations               PELVIC ALIGNMENT:correct alignment   LUMBARAROM/PROM   A/PROM A/PROM  eval  Flexion Decreased by 25%  Extension Decreased by 25%  Right lateral flexion Decreased by 25%  Left lateral flexion Decreased by 25%  Right rotation Decreased by 25%  Left rotation Decreased by 25%   (Blank rows = not tested)   LOWER EXTREMITY ROM:   Passive ROM Right eval Left eval  Hip external rotation 25 75   (Blank rows = not tested)   LOWER EXTREMITY MMT:   MMT Right eval Left eval Right 02/16/22 Left  02/16/2022  Hip extension 4/5 4/5 4/5 4/5  Hip abduction 4/5 4/5 4/5 4/5     PALPATION:                 External Perineal Exam tightness in the perineal body                             Internal Pelvic Floor tightness in the internal anal sphincter, along the puborectalis, puborectalis does not come forward   Patient confirms identification and approves PT to assess internal pelvic floor and treatment Yes   PELVIC MMT:   MMT eval 02/16/2022 02/23/2022  Internal Anal Sphincter 2/5 1 sec 2/5 40 sec 3/5  External Anal Sphincter 2/5 1 sec 2/5 40 sec 3/5  Puborectalis 2/5 1 sec 2/5 40 sec 3/5  (Blank rows = not tested)         TONE: good   PROLAPSE: none   TODAY'S TREATMENT  02/23/2022 Manual: Internal pelvic floor techniques:No emotional/communication barriers or cognitive limitation. Patient is motivated to learn. Patient understands and agrees with treatment goals and plan. PT explains patient will be examined in standing, sitting, and lying down to see how their muscles and joints work. When they are ready, they will be asked to remove their underwear so PT can examine their perineum. The patient is also given the option of providing their own chaperone as one is not provided in our facility. The patient also has the right and is explained the right to defer or refuse any part of the evaluation or treatment including the internal exam.  With the patient's consent, PT will use one gloved finger to gently assess the muscles of the pelvic floor, seeing how well it contracts and relaxes and if there is muscle symmetry. After, the patient will get dressed and PT and patient will discuss exam findings and plan of care. PT and patient discuss plan of care, schedule, attendance policy and HEP activities.  Going through the anus working on the anococcygeal ligament and puborectalis to have the muscle come forward with contraction  Neuromuscular re-education: Pelvic floor contraction training: Therapist finger in the anal canal working on contraction with a lift for 30-40 seconds Exercises:  Strengthening: Nustep level 5 for 6 minutes while therapist is assessing patient   02/16/2022 Manual: Internal pelvic floor techniques:No emotional/communication barriers or cognitive limitation. Patient is motivated to learn. Patient understands and agrees with treatment goals and plan. PT explains patient will be examined in standing, sitting, and lying down to see how their muscles and joints work. When they are ready, they will be asked to remove their underwear so PT can examine their perineum. The patient is also given the option of providing their own chaperone as one is not provided in our facility. The patient also has the right and is explained the right to defer or refuse any part of the evaluation or treatment including the internal exam. With the patient's consent, PT will use one gloved finger to gently assess the muscles of the pelvic floor, seeing how well it contracts and relaxes and if there is muscle symmetry. After, the patient will get dressed and PT and patient will discuss exam findings and plan of care. PT and patient discuss plan of care, schedule, attendance policy and HEP activities. Going through the rectum working on the puborectalis, along the anococcyxgeal ligament, along the EAS and IAS to lengthen the muscles for a better  contraction Neuromuscular re-education: Pelvic floor contraction training: Using tactile cues to the rectum to contract the EAS and IAS with a circular contraction and lift, contraction 40 sec Using tactile cues to have the puborectalis come forward  Exercises: Strengthening: Nustep level 5 for 5 minutes while therapist is assessing patient 01/05/2022 Exercises: Stretches/mobility: Strengthening: dead bug with green band around the knees and holding 1# in each hand Bridge with shoulder flexion to shoulder height with 1# in each hand Clam with green band around knees and bil. Shoulder horizontal abduction holding 1# wt.  Supine shoulder press with 1# wt. In hands and hold knees apart against green band Supine elbow flexion with 1# wt and hold knees apart against green band.  Nustep level 3 for 5 inutes     PATIENT EDUCATION:  01/05/2022 Education details: Access Code: [email or web address] ,work on anal contraction holding 40 sec Person educated: Patient Education method: Consulting civil engineer, Media planner, Corporate treasurer cues, Verbal cues, and Handouts Education comprehension: verbalized understanding, returned demonstration, verbal cues required, tactile cues required, and needs further education     HOME EXERCISE PROGRAM: 01/05/2022  Access Code: [email or web address] URL: [email or web address] Date: 01/05/2022 Prepared by: Earlie Counts   Exercises - Sidelying Pelvic Floor Contraction with Self-Palpation  - 3 x daily - 7 x weekly - 1 sets - 5 reps - 3 sec hold - Supine Hip Adduction Isometric with Ball  - 1 x daily - 4 x weekly - 1 sets - 5 reps - 5 sec hold - Supine Bridge with Mini Swiss Ball Between Knees  - 1 x daily - 4 x weekly - 1 sets - 15 reps - Hooklying Isometric Clamshell  - 1 x daily - 3 x weekly - 1 sets - 15 reps - Seated Pelvic Floor Contraction  - 2 x daily - 7 x weekly - 1 sets - 10 reps - 5 sec hold - Dead Bug  - 1 x daily - 3 x weekly - 1 sets - 10 reps - Bridge on Foam Roll - Arms  Raised  - 1 x daily - 3 x weekly - 1 sets - 10 reps - Supine  Shoulder Press  - 1 x daily - 3 x weekly - 1 sets - 10 reps - Supine Elbow Flexion Extension AROM  - 1 x daily - 7 x weekly - 1 sets - 10 reps   ASSESSMENT:   CLINICAL IMPRESSION: Patient is a 81 y.o. female who was seen today for physical therapy treatment for mixed incontinence. Pelvic floor strength increased to 3/5 and the puborectalis muscle can come forward now. She is able to feel the contraction of the rectum when at home and hold for 40 seconds. Patient was sore from her back therapy yesterday so we were careful of exercise today and she only had several hours of sleep.  Patient will benefit from skilled therapy to improve rectal strength to reduce her stool leakage.      OBJECTIVE IMPAIRMENTS decreased activity tolerance, decreased coordination, decreased endurance, decreased strength, increased fascial restrictions, and pain.    ACTIVITY LIMITATIONS continence and toileting   PARTICIPATION LIMITATIONS: community activity   PERSONAL FACTORS Age, Fitness, Time since onset of injury/illness/exacerbation, and 3+ comorbidities:    Posterior repair, perineoplasty cystoscopy, urethral bulking, 08/30/2021; Breast Cancer 12/2011 right, ER+; HX of radiation therapy 03/12/12-04/13/12; Hypertension; IBS; COPD; Stroke; Kyphoplasty 06/15/2017; are also affecting patient's functional outcome.    REHAB POTENTIAL: Excellent   CLINICAL DECISION MAKING: Stable/uncomplicated   EVALUATION COMPLEXITY: Low     GOALS: Goals reviewed with patient? Yes   SHORT TERM GOALS: Target date: 12/01/2021   Patient independent with diaphragmatic breathing to bulge the pelvic floor.  Baseline: Goal status: Met 12/08/2021   2.  Patient educated patient on correct toileting to fully empty her rectum.  Baseline:  Goal status: Met 12/08/2021     LONG TERM GOALS: Target date: 04/20/2022    Patient independent with pelvic floor strengthening to reduce her  leakage.  Baseline:  Goal status: ongoing 02/17/2023   2.  Rectal strength is 3/5 with good lift of the rectum to reduce stool leakage.  Baseline:  Goal status: ongoing 02/16/2022   3.  Patient reports she is not having to wear a pad due to reduction on stool leakage.  Baseline:  Goal status: ongoing 02/16/2022   4.  Patient reports her lower abdominal pain decreased </= 2/10 due to improved tissue restrictions.  Baseline:  Goal status: Met 12/08/2021       PLAN: PT FREQUENCY: 1x/week   PT DURATION: 12 weeks   PLANNED INTERVENTIONS: Therapeutic exercises, Therapeutic activity, Neuromuscular re-education, Patient/Family education, Self Care, Dry Needling, Electrical stimulation, Cryotherapy, Moist heat, Contrast bath, and Manual therapy   PLAN FOR NEXT SESSION: continue to progress HEP, nustep, ask about leakage  Earlie Counts, PT 02/23/22 1:11 PM    PHYSICAL THERAPY DISCHARGE SUMMARY  Visits from Start of Care: 6  Current functional level related to goals / functional outcomes: See above.    Remaining deficits: See above. Patient has not been reassessed due to not returning after 02/24/23.    Education / Equipment: HEP   Patient agrees to discharge. Patient goals were not met. Patient is being discharged due to not returning since the last visit. Thank you for the referral. Earlie Counts, PT 04/19/22 8:03 AM

## 2022-02-28 ENCOUNTER — Telehealth (HOSPITAL_BASED_OUTPATIENT_CLINIC_OR_DEPARTMENT_OTHER): Payer: Self-pay | Admitting: Cardiovascular Disease

## 2022-02-28 NOTE — Telephone Encounter (Signed)
Returned call to NP calling in regards to the patient, informed her that we have not checked an A1C on this patient

## 2022-02-28 NOTE — Telephone Encounter (Signed)
Calling to get patient labs results (A1C). Please advise

## 2022-03-02 ENCOUNTER — Ambulatory Visit: Payer: Medicare Other | Admitting: Physical Therapy

## 2022-03-23 ENCOUNTER — Ambulatory Visit: Payer: Medicare Other | Admitting: Physical Therapy

## 2022-03-30 ENCOUNTER — Telehealth (HOSPITAL_BASED_OUTPATIENT_CLINIC_OR_DEPARTMENT_OTHER): Payer: Self-pay | Admitting: *Deleted

## 2022-03-30 ENCOUNTER — Encounter: Payer: Medicare Other | Admitting: Physical Therapy

## 2022-03-30 ENCOUNTER — Telehealth (HOSPITAL_BASED_OUTPATIENT_CLINIC_OR_DEPARTMENT_OTHER): Payer: Self-pay | Admitting: Cardiovascular Disease

## 2022-03-30 NOTE — Telephone Encounter (Signed)
Message from Plan Request Reference Number: [id number]. REPATHA SURE INJ '140MG'$ /ML is approved through 09/27/2022. Your patient may now fill this prescription and it will be covered.  Above message received via Covermymeds, mychart message sent to patient

## 2022-03-30 NOTE — Telephone Encounter (Signed)
Patient states she has been under a lot of stress recently.  Her heart has been doing funny things and her back is bothering her.  Her daughter passed away yesterday and she just wants to talk to someone about her heart and her back.

## 2022-03-30 NOTE — Telephone Encounter (Signed)
Spoke with patient, she mainly just talked while I listened  Daughter passed away from breast cancer  Continues to have back pain and wanted orthopedist at [place] location name/number  Did give her number of [facility] and Dr Eddie Dibbles to make an appointment

## 2022-04-13 ENCOUNTER — Other Ambulatory Visit: Payer: Self-pay | Admitting: Pulmonary Disease

## 2022-04-13 DIAGNOSIS — R0982 Postnasal drip: Secondary | ICD-10-CM

## 2022-04-15 ENCOUNTER — Other Ambulatory Visit: Payer: Self-pay | Admitting: Pulmonary Disease

## 2022-04-15 DIAGNOSIS — R0982 Postnasal drip: Secondary | ICD-10-CM

## 2022-04-21 ENCOUNTER — Other Ambulatory Visit (HOSPITAL_BASED_OUTPATIENT_CLINIC_OR_DEPARTMENT_OTHER): Payer: Self-pay | Admitting: Cardiovascular Disease

## 2022-04-22 NOTE — Telephone Encounter (Signed)
Rx request sent to pharmacy.  

## 2022-04-26 ENCOUNTER — Telehealth (HOSPITAL_BASED_OUTPATIENT_CLINIC_OR_DEPARTMENT_OTHER): Payer: Self-pay | Admitting: Cardiovascular Disease

## 2022-04-26 NOTE — Telephone Encounter (Signed)
Patient was a walk in today asking if she needed to have lab work prior to her 222/24 visit with Dr. Pat Kocher can leave a message on her home phone.

## 2022-04-26 NOTE — Telephone Encounter (Signed)
NO LABS ORDERED, LEFT MESSAGE ON PATIENT HOME PHONE AS REQUESTED

## 2022-04-27 ENCOUNTER — Other Ambulatory Visit: Payer: Self-pay | Admitting: Adult Health

## 2022-05-05 ENCOUNTER — Ambulatory Visit (INDEPENDENT_AMBULATORY_CARE_PROVIDER_SITE_OTHER): Payer: Medicare Other | Admitting: Cardiovascular Disease

## 2022-05-05 ENCOUNTER — Encounter (HOSPITAL_BASED_OUTPATIENT_CLINIC_OR_DEPARTMENT_OTHER): Payer: Self-pay | Admitting: Cardiovascular Disease

## 2022-05-05 VITALS — BP 146/68 | HR 71 | Ht 63.5 in | Wt 138.9 lb

## 2022-05-05 DIAGNOSIS — I10 Essential (primary) hypertension: Secondary | ICD-10-CM | POA: Diagnosis not present

## 2022-05-05 DIAGNOSIS — I2510 Atherosclerotic heart disease of native coronary artery without angina pectoris: Secondary | ICD-10-CM | POA: Insufficient documentation

## 2022-05-05 DIAGNOSIS — I639 Cerebral infarction, unspecified: Secondary | ICD-10-CM | POA: Diagnosis not present

## 2022-05-05 NOTE — Patient Instructions (Addendum)
Medication Instructions:  Your physician recommends that you continue on your current medications as directed. Please refer to the Current Medication list given to you today.   *If you need a refill on your cardiac medications before your next appointment, please call your pharmacy*  Lab Work: NONE  Testing/Procedures: NONE  Follow-Up: At [facility], you and your health needs are our priority.  As part of our continuing mission to provide you with exceptional heart care, we have created designated Provider Care Teams.  These Care Teams include your primary Cardiologist (physician) and Advanced Practice Providers (APPs -  Physician Assistants and Nurse Practitioners) who all work together to provide you with the care you need, when you need it.  We recommend signing up for the patient portal called "MyChart".  Sign up information is provided on this After Visit Summary.  MyChart is used to connect with patients for Virtual Visits (Telemedicine).  Patients are able to view lab/test results, encounter notes, upcoming appointments, etc.  Non-urgent messages can be sent to your provider as well.   To learn more about what you can do with MyChart, go to [email or web address].    Your next appointment:   1 month(s)  Provider:   Skeet Latch, MD or Laurann Montana, NP    Other Instructions MONITOR YOUR BLOOD PRESSURE TWICE A DAY, LOG.  BRING LOG AND YOUR BLOOD PRESSURE MACHINE TO YOUR FOLLOW UP IN 1 MONTH

## 2022-05-05 NOTE — Progress Notes (Signed)
Advanced [facility]pertension Clinic Follow-up:     Date:  06/10/2022   ID:  Alexandria [name], DOB 1940-07-05, MRN [id number]  PCP:  [name], NP  Cardiologist:  Skeet Latch, MD  Nephrologist:  Referring MD: [name], NP   CC: Hypertension  History of Present Illness:    Alexandria [name] is a 82 y.o. female with a hx of nonobstructive CAD, anemia, arthritis, anxiety, asthma, brainstem infarct, right breast cancer s/p radiation therapy, COPD, DJD, depression, atrial fibrillation, GERD, hearing loss, glomerulonephritis, hyperlipidemia, hypertension, IBS, pneumonia, PVD (varicose veins), spinal stenosis, and left traumatic hemopneumothorax, here for follow-up. She was initially seen in the [facility] 11/2020. She was seen in Cardiology by Dr. Percival Spanish 05/15/2012 for evaluation of an apparent stroke. At that visit atenolol was discontinued and she was restarted on Toprol as it had been more effective. She had a nuclear stress test in 2014 that was negative.  She followed up with Laurann Montana, NP 06/2021 and reported myalgias after increasing simvastatin. Simvastatin was reduced to twice per week. Repatha was started. At her follow-up 10/2021 she continued to complain of myalgias. Simvastatin was reduced to 10 mg twice per week. Repeat labs 11/02/21 showed LDL 66. Her blood pressure was well controlled. She reported exertional chest pain just with ADL's. No improvement with her arthritis medication and tramadol. She took nitro x2 several hours later and before the 2nd nitro completely dissolved her chest pain had resolved. She was started on 15 mg Imdur which relieved her chest pain but caused headache. Her HCTZ was reduced to 12.5 mg once daily 12/2021. She last followed up with Laurann Montana, NP 01/2022. Simvastatin was again reduced to 10 mg once a week.  Today, she complains of terrible lower back pain. This is associated with tightness and pain radiating to her right  shoulder. Her last Dexa was 2.5 years ago. She plans to see Dr. Sammuel Hines in orthopedics. From a cardiac standpoint she believes she is doing well aside from her stress and grief. Unfortunately she recently lost her daughter to breast cancer. She has discovered that her BP cuff is inaccurate. In clinic today her BP is 136/64 (146/68 on recheck). Sometimes she has noticed low blood pressures associated with lightheadedness. She endorses intermittent LE swelling by the end of the day. For treatment she does wear compression socks which helps. She monitors her weight daily; at most she may gain 6 lbs in a day. She does not add salt to her meals. Additionally she complains of environmental allergies. For exercise she regularly visits the [employer] and uses other equipment at home. She denies any palpitations, chest pain, shortness of breath, headaches, syncope, orthopnea, or PND.   Past Medical History:  Diagnosis Date   Allergy    Anemia    hx of    Anxiety    Arthritis    left knee, hands, back   Asthma    daily and prn inhalers   Atypical chest pain 11/26/2020   Brainstem infarct, acute ([place]) 03/2011   slight expressive aphasia, occ. problems with balance   Breast cancer ([place]) 12/2011   right, ER+, PR -, Her 2 -   COPD (chronic obstructive pulmonary disease) (HCC)    Degenerative joint disease of low back    Depression    Dysrhythmia    atrial fib    GERD (gastroesophageal reflux disease)    Hearing loss    bilateral hearing aids   History of glomerulonephritis as a  child   and had abscess left kidney   History of kidney stones    Hx of radiation therapy 03/12/12 -04/13/12   right breast   Hyperlipidemia    Hypertension    under control, has been on med. since age 61   IBS (irritable bowel syndrome)    Long term current use of aromatase inhibitor 05/08/2012   Overactive bladder    Palpitations    Pneumonia    hx of x 2    PONV (postoperative nausea and vomiting)    PVD  (peripheral vascular disease) ([place])    varicose veins - left worse than right   Spinal stenosis    Stress incontinence    Stroke [facility])    problem with expressive communicaiton   Traumatic hemopneumothorax 1982   left    Past Surgical History:  Procedure Laterality Date   ANKLE HARDWARE REMOVAL     right   BREAST BIOPSY  1976   right   BREAST BIOPSY Left 2019   benign lymph node   BREAST LUMPECTOMY  2013   right with snbx   CHOLECYSTECTOMY  1990s   COLONOSCOPY     CYSTOSCOPY  07/05/2005   KNEE SURGERY  1980s   left   KYPHOPLASTY N/A 06/15/2017   Procedure: KYPHOPLASTY T11;  Surgeon: Melina Schools, MD;  Location: [facility];  Service: Orthopedics;  Laterality: N/A;  90 mins   ORIF ANKLE FRACTURE  1980s   right    TOTAL KNEE ARTHROPLASTY Left 12/08/2015   Procedure: LEFT TOTAL KNEE ARTHROPLASTY;  Surgeon: Paralee Cancel, MD;  Location: [facility];  Service: Orthopedics;  Laterality: Left;   TRANSOBTURATOR SLING  07/05/2005   TUBAL LIGATION  1976   VEIN SURGERY     vein ablation left leg    Current Medications: Current Meds  Medication Sig   albuterol (PROAIR HFA) 108 (90 Base) MCG/ACT inhaler Inhale 2 puffs into the lungs every 6 (six) hours as needed for wheezing or shortness of breath.   ARNICA EX Apply 1 application topically daily as needed (pain).   b complex vitamins tablet Take 1 tablet by mouth 2 (two) times daily. PLUS FOLIC ACID PLUS VIT. C   Biotin 5000 MCG CAPS Take 5,000 mcg by mouth daily.    Capsaicin 0.025 % PADS Apply 1 application topically daily as needed (pain).   Cholecalciferol (VITAMIN D-3) 5000 UNITS TABS Take 5,000 Units by mouth daily.    clopidogrel (PLAVIX) 75 MG tablet TAKE ONE TABLET BY MOUTH DAILY   CO-ENZYME Q10-VITAMIN E PO Take 1 tablet by mouth daily.   Diclofenac Sodium (PENNSAID) 2 % SOLN Apply 1 application topically 3 (three) times daily. (Patient taking differently: Apply 1 application  topically as needed.)   diphenhydrAMINE (BENADRYL) 25 MG  tablet Take 25 mg by mouth every 6 (six) hours as needed. Patient taking 1/2 (12.5 mg) as needed for allergies and itching   escitalopram (LEXAPRO) 10 MG tablet TAKE ONE TABLET BY MOUTH ONCE DAILY   fluticasone (FLONASE) 50 MCG/ACT nasal spray USE 1 SPRAY IN EACH NOSTRIL DAILY.   fluticasone-salmeterol (ADVAIR HFA) 230-21 MCG/ACT inhaler Inhale 2 puffs into the lungs 2 (two) times daily.   ipratropium (ATROVENT) 0.03 % nasal spray Place 2 sprays into both nostrils every 12 (twelve) hours.   loratadine (CLARITIN) 10 MG tablet Take 10 mg by mouth daily as needed for allergies.   Misc Natural Products (OSTEO BI-FLEX ADV TRIPLE ST PO) Take 1 tablet by mouth daily.  montelukast (SINGULAIR) 10 MG tablet Take 1 tablet (10 mg total) by mouth at bedtime.   ondansetron (ZOFRAN-ODT) 4 MG disintegrating tablet DISSOLVE 1 TABLET ON TONGUE EVERY 8 HOURS AS NEEDED FOR NAUSEA/VOMITING.   pantoprazole (PROTONIX) 40 MG tablet TAKE ONE TABLET BY MOUTH DAILY   Polyethyl Glycol-Propyl Glycol (SYSTANE OP) Place 1 drop into both eyes daily as needed. Dry eyes   potassium chloride (KLOR-CON) 10 MEQ tablet Take 2 tablets (20 mEq total) by mouth 2 (two) times daily.   Probiotic Product (ALIGN PO) Take 1 tablet by mouth daily.   Selenium 200 MCG CAPS Take 200 mcg by mouth 2 (two) times daily.    simvastatin (ZOCOR) 10 MG tablet Take 1 tablet (10 mg total) by mouth once a week.   Trolamine Salicylate (ASPERCREME EX) Apply 1 application topically daily as needed (pain).   [DISCONTINUED] amLODipine (NORVASC) 10 MG tablet TAKE 1 TABLET EACH DAY.   [DISCONTINUED] hydrochlorothiazide (MICROZIDE) 12.5 MG capsule Take 12.5 mg by mouth daily. Take 1 Tablet Daily   [DISCONTINUED] isosorbide mononitrate (IMDUR) 30 MG 24 hr tablet Take 0.5 tablets (15 mg total) by mouth daily. Take at bedtime.   [DISCONTINUED] REPATHA SURECLICK [id number] MG/ML SOAJ INJECT 140MG  SUBCUTANEOUSLY  EVERY 2 WEEKS   [DISCONTINUED] tiZANidine (ZANAFLEX) 2 MG  tablet Take 2 mg by mouth at bedtime as needed. (Patient not taking: Reported on 06/03/2022)     Allergies:   Augmentin [amoxicillin-pot clavulanate], Penicillins, Aspirin, Chlorhexidine gluconate, Chocolate, Coffee bean extract, Diclofenac, Ibuprofen, Oxycodone hcl, Shrimp [shellfish allergy], Sulfonamide derivatives, Lipitor [atorvastatin calcium], and Methocarbamol   Social History   Socioeconomic History   Marital status: Widowed    Spouse name: Not on file   Number of children: Not on file   Years of education: Not on file   Highest education level: Not on file  Occupational History   Not on file  Tobacco Use   Smoking status: Former    Packs/day: 0.50    Years: 15.00    Additional pack years: 0.00    Total pack years: 7.50    Types: Cigarettes    Quit date: 08/28/1993    Years since quitting: 28.8   Smokeless tobacco: Never  Vaping Use   Vaping Use: Never used  Substance and Sexual Activity   Alcohol use: No    Alcohol/week: 0.0 standard drinks of alcohol   Drug use: No   Sexual activity: Not Currently  Other Topics Concern   Not on file  Social History Narrative   Widowed since 2014   Retired Scientist, product/process development    Son and daughter   Social Determinants of Health   Financial Resource Strain: [place]  (11/26/2020)   Overall Financial Resource Strain (CARDIA)    Difficulty of Paying Living Expenses: Not hard at all  Food Insecurity: No Food Insecurity (11/26/2020)   Hunger Vital Sign    Worried About Running Out of Food in the Last Year: Never true    [place] in the Last Year: Never true  Transportation Needs: No Transportation Needs (11/26/2020)   PRAPARE - Hydrologist (Medical): No    Lack of Transportation (Non-Medical): No  Physical Activity: Insufficiently Active (11/26/2020)   Exercise Vital Sign    Days of Exercise per Week: 3 days    Minutes of Exercise per Session: 30 min  Stress: Not on file  Social Connections: Not on  file     Family History: The patient's  family history includes Breast cancer in her cousin, daughter, paternal aunt, paternal aunt, and paternal uncle; Cancer in her maternal aunt and paternal aunt; Congestive Heart Failure in her paternal uncle and paternal uncle; Dementia in her paternal aunt; Heart attack in her father and paternal grandfather; Stomach cancer in her paternal uncle; Stroke in her mother.  ROS:   Please see the history of present illness.    (+) Lower back pain and tightness (+) Right shoulder pain (+) Intermittent LE edema (+) Lightheadedness secondary to low blood pressure All other systems reviewed and are negative.  EKGs/Labs/Other Studies Reviewed:    Coronary CT  12/03/2020: IMPRESSION: 1. Coronary calcium score of 71 (LAD 50, RCA 21). This was 11 percentile for age and sex matched control.   2. Normal coronary origin with right dominance.   3. Moderate CAD in LAD and RCA distribution. Will send for FFR analysis.   4.  Aortic atherosclerosis.   CAD-RADS 2. Mild non-obstructive CAD (25-49%). Consider non-atherosclerotic causes of chest pain. Consider preventive therapy and risk factor modification.  LE Venous Reflux 03/04/2020: Summary:  Right:  - No evidence of deep vein thrombosis from the common femoral through the  popliteal veins.  - No evidence of superficial vein thrombosis.  - Popliteal cyst observed.  - The common femoral vein is incompetent.  - The great and small saphenous veins were not visualized, consistent with  history of ablation.     Left:  - No evidence of deep vein thrombosis from the common femoral through the  popliteal veins.  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  - No true great saphenous vein identified.  - The small saphenous vein is not competent.   LE Venous DVT 06/11/2019: Summary:  RIGHT:  - Findings consistent with acute superficial vein thrombosis involving the  right  varicosities or other superficial veins.  - Findings consistent with age indeterminate superficial vein thrombosis  involving the right superficial veins/varciosities.  - There is no evidence of deep vein thrombosis in the lower extremity.     - No cystic structure found in the popliteal fossa.     LEFT:  - No evidence of common femoral vein obstruction.   EKG:  EKG is personally reviewed. 05/05/2022:  EKG was not ordered. 11/26/2020: Sinus rhythm. Rate 80 bpm.  Recent Labs: 11/02/2021: ALT 11; BUN 24; Creatinine, Ser 0.95; Potassium 4.3; Sodium 143 12/03/2021: Hemoglobin 12.1; Platelets 213.0   Recent Lipid Panel    Component Value Date/Time   CHOL 156 11/02/2021 0826   TRIG 102 11/02/2021 0826   HDL 72 11/02/2021 0826   CHOLHDL 2.2 11/02/2021 0826   CHOLHDL 5 07/10/2019 1441   VLDL 36.6 07/10/2019 1441   LDLCALC 66 11/02/2021 0826   LDLDIRECT 183.5 12/17/2008 0953    Physical Exam:    VS:  BP (!) 146/68 (BP Location: Left Arm, Patient Position: Sitting, Cuff Size: Large)   Pulse 71   Ht 5' 3.5" (1.613 m)   Wt 138 lb 14.4 oz (63 kg)   BMI 24.22 kg/m  , BMI Body mass index is 24.22 kg/m. GENERAL:  Well appearing HEENT: Pupils equal round and reactive, fundi not visualized, oral mucosa unremarkable NECK:  No jugular venous distention, waveform within normal limits, carotid upstroke brisk and symmetric, no bruits LUNGS:  Clear to auscultation bilaterally HEART:  RRR.  PMI not displaced or sustained,S1 and S2 within normal limits, no S3, no S4, no clicks, no rubs, no murmurs  ABD:  Flat, positive bowel sounds normal in frequency in pitch, no bruits, no rebound, no guarding, no midline pulsatile mass, no hepatomegaly, no splenomegaly EXT:  2 plus pulses throughout, no edema, no cyanosis no clubbing.  Small, tender palpable cord at L ankle SKIN:  No rashes no nodules NEURO:  Cranial nerves II through XII grossly intact, motor grossly intact throughout PSYCH:  Cognitively  intact, oriented to person place and time   ASSESSMENT/PLAN:    Stroke No residual symptoms.  Continue with secondary prevention with aspirin, clopidogrel , Repatha and simvastatin.  CAD in native artery Non-obstructive CAD on coronary CT-A in 2022.  She has no angina and hear breathing is stable.  Lipids are well-controlled.  Continue simvastatin and Repartha.  Continue amlodipine, Clopidogrel, Imdur.    Essential hypertension, benign Blood pressure is uncontrolled.  She recently lost her daughter and has been quite stressed.  Will track BP at home.  Continue amlodipine, HCTZ and Imdur.  BP goal is <130/80.   Screening for Secondary Hypertension:     Relevant Labs/Studies:    Latest Ref Rng & Units 11/02/2021    8:26 AM 07/05/2021    9:54 AM 03/04/2021    8:07 AM  Basic Labs  Sodium 134 - 144 mmol/L 143  142  147   Potassium 3.5 - 5.2 mmol/L 4.3  4.3  3.9   Creatinine 0.57 - 1.00 mg/dL 0.95  0.87  0.79        Latest Ref Rng & Units 02/16/2021    2:40 PM 07/10/2019    2:41 PM  Thyroid   TSH 0.35 - 5.50 uIU/mL 2.24  1.84                   Disposition:    FU with APP in 1 month.  Medication Adjustments/Labs and Tests Ordered: Current medicines are reviewed at length with the patient today.  Concerns regarding medicines are outlined above.   No orders of the defined types were placed in this encounter.  No orders of the defined types were placed in this encounter.  I,Mathew Stumpf,acting as a Education administrator for Skeet Latch, MD.,have documented all relevant documentation on the behalf of Skeet Latch, MD,as directed by  Skeet Latch, MD while in the presence of Skeet Latch, MD.  I, Patterson Oval Linsey, MD have reviewed all documentation for this visit.  The documentation of the exam, diagnosis, procedures, and orders on 06/10/2022 are all accurate and complete.  Signed, Skeet Latch, MD  06/10/2022 2:39 PM    [facility]

## 2022-05-05 NOTE — Assessment & Plan Note (Signed)
No residual symptoms.  Continue with secondary prevention with aspirin, clopidogrel , Repatha and simvastatin.

## 2022-05-09 ENCOUNTER — Other Ambulatory Visit: Payer: Self-pay | Admitting: Adult Health

## 2022-05-09 DIAGNOSIS — I10 Essential (primary) hypertension: Secondary | ICD-10-CM

## 2022-05-18 ENCOUNTER — Other Ambulatory Visit (HOSPITAL_BASED_OUTPATIENT_CLINIC_OR_DEPARTMENT_OTHER): Payer: Self-pay | Admitting: Orthopaedic Surgery

## 2022-05-18 ENCOUNTER — Ambulatory Visit (INDEPENDENT_AMBULATORY_CARE_PROVIDER_SITE_OTHER): Payer: Medicare Other | Admitting: Orthopaedic Surgery

## 2022-05-18 ENCOUNTER — Ambulatory Visit (INDEPENDENT_AMBULATORY_CARE_PROVIDER_SITE_OTHER): Payer: Medicare Other

## 2022-05-18 DIAGNOSIS — Z9889 Other specified postprocedural states: Secondary | ICD-10-CM

## 2022-05-18 DIAGNOSIS — M546 Pain in thoracic spine: Secondary | ICD-10-CM | POA: Diagnosis not present
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
## 2022-05-26 ENCOUNTER — Ambulatory Visit (INDEPENDENT_AMBULATORY_CARE_PROVIDER_SITE_OTHER): Payer: Medicare Other | Admitting: Physical Medicine and Rehabilitation

## 2022-05-26 ENCOUNTER — Encounter: Payer: Self-pay | Admitting: Physical Medicine and Rehabilitation

## 2022-05-26 DIAGNOSIS — Z9889 Other specified postprocedural states: Secondary | ICD-10-CM

## 2022-05-26 DIAGNOSIS — G8929 Other chronic pain: Secondary | ICD-10-CM | POA: Diagnosis not present

## 2022-05-26 DIAGNOSIS — M7918 Myalgia, other site: Secondary | ICD-10-CM | POA: Diagnosis not present

## 2022-05-26 DIAGNOSIS — M546 Pain in thoracic spine: Secondary | ICD-10-CM | POA: Diagnosis not present

## 2022-05-26 NOTE — Progress Notes (Signed)
Functional Pain Scale - descriptive words and definitions  Intense (8)    Cannot complete any ADLs without much assistance/cannot concentrate/conversation is difficult/unable to sleep and unable to use distraction. Severe range order  Average Pain  5-7, but can be higher  Middle back pain in the middle that radiates up to the shoulder blades
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
## 2022-06-01 ENCOUNTER — Other Ambulatory Visit: Payer: Self-pay | Admitting: Adult Health

## 2022-06-01 NOTE — Telephone Encounter (Signed)
Okay for refill?  

## 2022-06-02 ENCOUNTER — Other Ambulatory Visit: Payer: Self-pay | Admitting: Adult Health

## 2022-06-02 DIAGNOSIS — Z1231 Encounter for screening mammogram for malignant neoplasm of breast: Secondary | ICD-10-CM

## 2022-06-03 ENCOUNTER — Ambulatory Visit (INDEPENDENT_AMBULATORY_CARE_PROVIDER_SITE_OTHER): Payer: Medicare Other | Admitting: Family

## 2022-06-03 ENCOUNTER — Encounter (HOSPITAL_BASED_OUTPATIENT_CLINIC_OR_DEPARTMENT_OTHER): Payer: Self-pay | Admitting: Family

## 2022-06-03 VITALS — BP 138/72 | HR 70 | Ht 63.5 in | Wt 137.0 lb

## 2022-06-03 DIAGNOSIS — I10 Essential (primary) hypertension: Secondary | ICD-10-CM

## 2022-06-03 DIAGNOSIS — I25118 Atherosclerotic heart disease of native coronary artery with other forms of angina pectoris: Secondary | ICD-10-CM | POA: Diagnosis not present

## 2022-06-03 DIAGNOSIS — E785 Hyperlipidemia, unspecified: Secondary | ICD-10-CM

## 2022-06-03 MED ORDER — REPATHA SURECLICK 140 MG/ML ~~LOC~~ SOAJ: 140.0000 mg | SUBCUTANEOUS | 3 refills | Status: DC

## 2022-06-03 MED ORDER — ISOSORBIDE MONONITRATE ER 30 MG PO TB24: 30.0000 mg | ORAL_TABLET | Freq: Every day | ORAL | 3 refills | Status: DC

## 2022-06-03 NOTE — Progress Notes (Unsigned)
Office Visit    Patient Name: Alexandria [name] Date of Encounter: 06/03/2022  PCP:  [name], NP   [place]  Cardiologist:  Skeet Latch, MD  Advanced Practice Provider:  No care team member to display Electrophysiologist:  None      Chief Complaint    Alexandria [name] is a 82 y.o. 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 since age 66   IBS (irritable bowel syndrome)    Long term current use of aromatase inhibitor 05/08/2012   Overactive bladder    Palpitations    Pneumonia    hx of x 2    PONV (postoperative nausea and vomiting)    PVD (peripheral vascular disease) ([place])    varicose veins - left worse than right   Spinal stenosis    Stress incontinence    Stroke [facility])    problem with expressive communicaiton   Traumatic hemopneumothorax 1982   left   Past Surgical History:  Procedure Laterality Date   ANKLE HARDWARE REMOVAL     right   BREAST BIOPSY  1976   right   BREAST BIOPSY Left 2019   benign lymph node   BREAST LUMPECTOMY  2013   right with snbx   CHOLECYSTECTOMY  1990s   COLONOSCOPY     CYSTOSCOPY   07/05/2005   KNEE SURGERY  1980s   left   KYPHOPLASTY N/A 06/15/2017   Procedure: KYPHOPLASTY T11;  Surgeon: Melina Schools, MD;  Location: [facility];  Service: Orthopedics;  Laterality: N/A;  90 mins   ORIF ANKLE FRACTURE  1980s   right    TOTAL KNEE ARTHROPLASTY Left 12/08/2015   Procedure: LEFT TOTAL KNEE ARTHROPLASTY;  Surgeon: Paralee Cancel, MD;  Location: [facility];  Service: Orthopedics;  Laterality: Left;   TRANSOBTURATOR SLING  07/05/2005   TUBAL LIGATION  1976   VEIN SURGERY     vein ablation left leg    Allergies  Allergies  Allergen Reactions   Augmentin [Amoxicillin-Pot Clavulanate] Itching, Rash and Other (See Comments)    PT DEVELOPED SEVERE RASH INVOLVING MUCUS MEMBRANES or SKIN NECROSIS WITH PENICILLINS: #  #  #  YES  #  #  #     Penicillins Hives and Other (See Comments)    Has patient had a PCN reaction causing immediate rash, facial/tongue/throat swelling, SOB or lightheadedness with hypotension: No HAS PT DEVELOPED SEVERE  RASH INVOLVING MUCUS MEMBRANES or SKIN NECROSIS: #  #  #  YES  #  #  #   Has patient had a PCN reaction that required hospitalization: No Has patient had a PCN reaction occurring within the last 10 years: No    Aspirin Hives   Chlorhexidine Gluconate Rash   Chocolate Hives and Other (See Comments)    RUNNY NOSE    Coffee Bean Extract Hives and Other (See Comments)    RUNNY NOSE    Diclofenac Other (See Comments)   Ibuprofen Hives and Other (See Comments)    RUNNY NOSE   Oxycodone Hcl Hives and Nausea And Vomiting   Shrimp [Shellfish Allergy] Hives and Other (See Comments)    RUNNY NOSE    Sulfonamide Derivatives Hives   Lipitor [Atorvastatin Calcium]     Leg Pain/Joint Pain/Back Pain   Methocarbamol Other (See Comments)    History of Present Illness    Alexandria [name] is a 82 y.o. female with a hx of CAD, DVT, anemia, arthritis, anxiety, asthma, acute brainstem infarct, right breast cancer s/p radiation therapy, COPD, DJD, depression, atrial  fibrillation, GERD, hearing loss, glomerulonephritis, hyperlipidemia, hypertension, IBS, pneumonia, PVD (varicose veins), spinal stenosis, left traumatic hemopneumothorax last seen 05/05/22  Prior stress test 2010 with EF 82%, no ischemia nor infarct. Previously evaluated in 2014 by Dr. Percival Spanish after diagnosis of stroke -due to palpitations atenolol was transitioned to Toprol.  Echocardiogram was recommended and performed 05/2012 revealing EF 60%, no RWMA, no cardiac source of emboli.  Myoview 2014 with no evidence of ischemia nor infarct.  Evaluated 11/26/2020 after referral from PCP for hypertension.  Noted prior to a scheduled posterior vaginal wall repair 05/31/2020 she had mechanical fall.  Twisted right ankle and fell flat in her driveway.  Subsequently developed 3 small thrombi above the ventricles in her posterior head.  Another fall where she fell backwards on concrete and fractured T11 vertebrae.  Then tore her left rotator cuff.  Posterior vaginal wall repair was never able to be completed due to these medical issues but she was inclined to have that surgery.  She admitted chest pain which she attributed to GERD.  However also with exertional component and was recommended for ischemic evaluation. She did note previous muscle cramps with Atorvastatin. Cardiac CTA 11/2020 coronary calcium score of 71 placing her in the 47th percentile for age and sex matched control (RCA 25-49%, proximal LAD 0-24%, proximal large first diagonal 25-49%).  FFR with no significant stenosis.  At clinic visit 07/05/2021 her simvastatin was reduced to twice per week due to myalgias.  She was started on Repatha.  Seen 11/04/21 with no anginal symptoms. Simvastatin was reduced to weekly due to myalgias. At visit 01/05/22 chest pain relieved by Imdur but complicated by headache. HCTZ reduced to 12.5mg  daily.   Last saw Dr. Oval Linsey 05/05/22. Her BP was elevated which was felt to be related to pain and recent loss of her daughter to  breast cancer. Home BP cuff found to be inaccurate. She was asked to monitor at home.   She presents today for follow-up.  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 Saw orthopedics earlier this month for back pain and was referred to Dr. Ernestina Patches for possible thoracic facet denervation. She had brain MRI yesterday due to falls, dizziness. Notes stressors with selling her beach home, loss of her daughter, and pain likely contributory to elevated BP. BP at home routinely >130/80. She  has been taking her HCTZ only once daily.   EKGs/Labs/Other Studies Reviewed:   The following studies were reviewed today:  Cardiac CTA 12/04/2018  Aorta:  Normal size.  Mild calcifications.  No dissection.   Aortic Valve: No calcifications.   Coronary Arteries:  Normal coronary origin.  Right dominance.   RCA is a large dominant artery that gives rise to PDA and PLA. There is proximal calcified plaque, 25-49% stenosis.   Left main is a large artery that gives rise to LAD and LCX arteries.   LAD is a large vessel that has proximal calcified plaque, 0-24% stenosis, proximal large first diagonal calcified plaque, 25-49% stenosis.   LCX is a non-dominant artery that gives rise to one large OM1 branch. There is no plaque.   Other findings:   Normal pulmonary vein drainage into the left atrium.   Normal left atrial appendage without a thrombus.   Normal size of the pulmonary artery.   Please see radiology report for non cardiac findings.   IMPRESSION: 1. Coronary calcium score of 71 (LAD 50, RCA 21). This was 67 percentile for age and sex matched control.   2. Normal coronary origin with right dominance.   3. Moderate CAD in LAD and RCA distribution. Will send for FFR analysis.   4.  Aortic atherosclerosis.   CAD-RADS 2. Mild non-obstructive CAD (25-49%). Consider non-atherosclerotic causes of chest pain. Consider preventive therapy and risk factor  modification.  FFR 1. Left Main: No significant stenosis.   2. LAD: No significant stenosis. 3. LCX: No significant stenosis. 4. RCA: No significant stenosis.   IMPRESSION: 1.  CT FFR analysis didn't show any significant stenosis.   LE Venous Reflux 03/04/2020: Summary:  Right:  - No evidence of deep vein thrombosis from the common femoral through the  popliteal veins.  - No evidence of superficial vein thrombosis.  - Popliteal cyst observed.  - The common femoral vein is incompetent.  - The great and small saphenous veins were not visualized, consistent with  history of ablation.     Left:  - No evidence of deep vein thrombosis from the common femoral through the  popliteal veins.  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  - No true great saphenous vein identified.  - The small saphenous vein is not competent.    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  - Findings consistent with age indeterminate superficial vein thrombosis  involving the right superficial veins/varciosities.  - There is no evidence of deep vein thrombosis in the lower extremity.     - No cystic structure found in the popliteal fossa.   EKG: No EKG today  Recent Labs: 11/02/2021: ALT 11; BUN 24; Creatinine, Ser 0.95; Potassium 4.3; Sodium 143 12/03/2021: Hemoglobin 12.1; Platelets 213.0  Recent Lipid Panel    Component Value Date/Time   CHOL 156 11/02/2021 0826   TRIG 102 11/02/2021 0826   HDL 72 11/02/2021 0826   CHOLHDL 2.2 11/02/2021 0826   CHOLHDL 5 07/10/2019 1441   VLDL 36.6 07/10/2019 1441   LDLCALC 66 11/02/2021 0826   LDLDIRECT 183.5 12/17/2008 0953    Home Medications   Current Meds  Medication Sig   acetaminophen (TYLENOL) 325 MG tablet Take 325 mg by mouth every 6 (six) hours as needed.   albuterol (PROAIR HFA) 108  (90 Base) MCG/ACT inhaler Inhale 2 puffs  into the lungs every 6 (six) hours as needed for wheezing or shortness of breath.   amLODipine (NORVASC) 10 MG tablet TAKE 1 TABLET EACH DAY.   ARNICA EX Apply 1 application topically daily as needed (pain).   b complex vitamins tablet Take 1 tablet by mouth 2 (two) times daily. PLUS FOLIC ACID PLUS VIT. C   Biotin 5000 MCG CAPS Take 5,000 mcg by mouth daily.    Capsaicin 0.025 % PADS Apply 1 application topically daily as needed (pain).   Cholecalciferol (VITAMIN D-3) 5000 UNITS TABS Take 5,000 Units by mouth daily.    clopidogrel (PLAVIX) 75 MG tablet TAKE ONE TABLET BY MOUTH DAILY   CO-ENZYME Q10-VITAMIN E PO Take 1 tablet by mouth daily.   Diclofenac Sodium (PENNSAID) 2 % SOLN Apply 1 application topically 3 (three) times daily. (Patient taking differently: Apply 1 application  topically as needed.)   diphenhydrAMINE (BENADRYL) 25 MG tablet Take 25 mg by mouth every 6 (six) hours as needed. Patient taking 1/2 (12.5 mg) as needed for allergies and itching   escitalopram (LEXAPRO) 10 MG tablet TAKE ONE TABLET BY MOUTH ONCE DAILY   fluticasone (FLONASE) 50 MCG/ACT nasal spray USE 1 SPRAY IN EACH NOSTRIL DAILY.   fluticasone-salmeterol (ADVAIR HFA) 230-21 MCG/ACT inhaler Inhale 2 puffs into the lungs 2 (two) times daily.   hydrochlorothiazide (MICROZIDE) 12.5 MG capsule TAKE (1) CAPSULE TWICE DAILY.   ipratropium (ATROVENT) 0.03 % nasal spray Place 2 sprays into both nostrils every 12 (twelve) hours.   isosorbide mononitrate (IMDUR) 30 MG 24 hr tablet Take 0.5 tablets (15 mg total) by mouth daily. Take at bedtime.   loratadine (CLARITIN) 10 MG tablet Take 10 mg by mouth daily as needed for allergies.   Misc Natural Products (OSTEO BI-FLEX ADV TRIPLE ST PO) Take 1 tablet by mouth daily.   montelukast (SINGULAIR) 10 MG tablet Take 1 tablet (10 mg total) by mouth at bedtime.   ondansetron (ZOFRAN-ODT) 4 MG disintegrating tablet DISSOLVE 1 TABLET ON TONGUE  EVERY 8 HOURS AS NEEDED FOR NAUSEA/VOMITING.   pantoprazole (PROTONIX) 40 MG tablet TAKE ONE TABLET BY MOUTH DAILY   Polyethyl Glycol-Propyl Glycol (SYSTANE OP) Place 1 drop into both eyes daily as needed. Dry eyes   potassium chloride (KLOR-CON) 10 MEQ tablet Take 2 tablets (20 mEq total) by mouth 2 (two) times daily.   Probiotic Product (ALIGN PO) Take 1 tablet by mouth daily.   REPATHA SURECLICK [id number] MG/ML SOAJ INJECT 140MG  SUBCUTANEOUSLY  EVERY 2 WEEKS   Selenium 200 MCG CAPS Take 200 mcg by mouth 2 (two) times daily.    simvastatin (ZOCOR) 10 MG tablet Take 1 tablet (10 mg total) by mouth once a week.   traMADol (ULTRAM) 50 MG tablet TAKE ONE TABLET BY MOUTH EVERY 8 HOURS AS NEEDED   Trolamine Salicylate (ASPERCREME EX) Apply 1 application topically daily as needed (pain).   UNABLE TO FIND Med Name: Neuriva Brain Supplement   Vibegron 75 MG TABS Take 1 tablet by mouth daily.     Review of Systems      All other systems reviewed and are otherwise negative except as noted above.  Physical Exam    VS:  BP 138/72   Pulse 70   Ht 5' 3.5" (1.613 m)   Wt 137 lb (62.1 kg)   SpO2 99%   BMI 23.89 kg/m  , BMI Body mass index is 23.89 kg/m.  Wt Readings from Last 3 Encounters:  06/03/22 137 lb (62.1 kg)  05/05/22 138 lb 14.4 oz (63 kg)  01/25/22 137 lb 1.6 oz (62.2 kg)    GEN: Well nourished, well developed, in no acute distress. HEENT: normal. Neck: Supple, no JVD, carotid bruits, or masses. Cardiac: RRR, no murmurs, rubs, or gallops. No clubbing, cyanosis, edema.  Radials/PT 2+ and equal bilaterally.  Respiratory:  Respirations regular and unlabored, clear to auscultation bilaterally. GI: Soft, nontender, nondistended. MS: No deformity or atrophy. Skin: Warm and dry, no rash. Neuro:  Strength and sensation are intact. Psych: Normal affect.  Assessment & Plan   CAD - Moderate nonobstructive disease by cardiac CTA 11/2020 with no evidence of significant stenosis by FFR. No  anginal symptoms. No indication for ischemic evaluation.  GDMT includes Plavix, simvastatin, Imdur, Repatha, PRN nitroglycerin.  Heart healthy diet and regular cardiovascular exercise encouraged.  Heart healthy diet and regular cardiovascular exercise encouraged.    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 11/2020 LE duplex no DVT.  No indication for anticoagulation.  HLD, LDL goal <70 - Did not tolerate Atorvastatin with myalgias. Tolerates Simvastatin once per week. Continue Repatha. She will call [facility] to get replacements for 2 pens that did not work for her.   HTN - BP not at goal <130/80. Continue HCTZ 12.5mg  QD, Amlodipine 10mg  QD . Increase Imdur from 15mg  to 30mg  QHS. Discussed to monitor BP at home at least 2 hours after medications and sitting for 5-10 minutes.   Hx of CVA - Continue Aspirin, statin, Repatha.    Disposition: Follow up  in 2-3 mos  with Skeet Latch, MD or APP.  Signed, Loel Dubonnet, NP 06/03/2022, 3:15 PM [facility]

## 2022-06-03 NOTE — Patient Instructions (Addendum)
Medication Instructions:  Your physician has recommended you make the following change in your medication:    CONTINUE Hydrochlorothiazide 12.5mg  daily in the morning  CHANGE Isosorbide Mononitrate (Imdur) to ONE 30mg  tablet in the evening  *If you need a refill on your cardiac medications before your next appointment, please call your pharmacy*  Follow-Up: At [facility], you and your health needs are our priority.  As part of our continuing mission to provide you with exceptional heart care, we have created designated Provider Care Teams.  These Care Teams include your primary Cardiologist (physician) and Advanced Practice Providers (APPs -  Physician Assistants and Nurse Practitioners) who all work together to provide you with the care you need, when you need it.  We recommend signing up for the patient portal called "MyChart".  Sign up information is provided on this After Visit Summary.  MyChart is used to connect with patients for Virtual Visits (Telemedicine).  Patients are able to view lab/test results, encounter notes, upcoming appointments, etc.  Non-urgent messages can be sent to your provider as well.   To learn more about what you can do with MyChart, go to [email or web address].    Your next appointment:   2-3 month(s)  Provider:   Skeet Latch, MD or Laurann Montana, NP   Other Instructions   Recommend calling [facility] to get replacements for the pens that did not work  Tips to Measure your Blood Pressure Correctly  Here's what you can do to ensure a correct reading:  Don't drink a caffeinated beverage or smoke during the 30 minutes before the test.  Sit quietly for five minutes before the test begins.  During the measurement, sit in a chair with your feet on the floor and your arm supported so your elbow is at about heart level.  The inflatable part of the cuff should completely cover at least 80% of your upper arm, and the cuff should be placed on bare  skin, not over a shirt.  Don't talk during the measurement.   Blood pressure categories  Blood pressure category SYSTOLIC (upper number)  DIASTOLIC (lower number)  Normal Less than 120 mm Hg and Less than 80 mm Hg  Elevated 120-129 mm Hg and Less than 80 mm Hg  High blood pressure: Stage 1 hypertension 130-139 mm Hg or 80-89 mm Hg  High blood pressure: Stage 2 hypertension 140 mm Hg or higher or 90 mm Hg or higher  Hypertensive crisis (consult your doctor immediately) Higher than 180 mm Hg and/or Higher than 120 mm Hg  Source: American Heart Association and American Stroke Association. For more on getting your blood pressure under control, buy Controlling Your Blood Pressure, a Special Health Report from [facility].   Blood Pressure Log   Date   Time  Blood Pressure  Example: Nov 1 9 AM 124/78

## 2022-06-06 ENCOUNTER — Encounter (HOSPITAL_BASED_OUTPATIENT_CLINIC_OR_DEPARTMENT_OTHER): Payer: Self-pay | Admitting: Family

## 2022-06-08 ENCOUNTER — Other Ambulatory Visit: Payer: Self-pay | Admitting: Adult Health

## 2022-06-08 DIAGNOSIS — I10 Essential (primary) hypertension: Secondary | ICD-10-CM

## 2022-06-10 ENCOUNTER — Encounter (HOSPITAL_BASED_OUTPATIENT_CLINIC_OR_DEPARTMENT_OTHER): Payer: Self-pay | Admitting: Cardiovascular Disease

## 2022-06-10 NOTE — Assessment & Plan Note (Signed)
Blood pressure is uncontrolled.  She recently lost her daughter and has been quite stressed.  Will track BP at home.  Continue amlodipine, HCTZ and Imdur.  BP goal is <130/80.

## 2022-06-10 NOTE — Assessment & Plan Note (Signed)
Non-obstructive CAD on coronary CT-A in 2022.  She has no angina and hear breathing is stable.  Lipids are well-controlled.  Continue simvastatin and Repartha.  Continue amlodipine, Clopidogrel, Imdur.

## 2022-06-15 ENCOUNTER — Ambulatory Visit
Admission: RE | Admit: 2022-06-15 | Discharge: 2022-06-15 | Disposition: A | Discharge status: Home / Self Care | Payer: Medicare Other | Source: Ambulatory Visit | Attending: Physical Medicine and Rehabilitation | Admitting: Physical Medicine and Rehabilitation

## 2022-06-15 DIAGNOSIS — M7918 Myalgia, other site: Secondary | ICD-10-CM

## 2022-06-15 DIAGNOSIS — G8929 Other chronic pain: Secondary | ICD-10-CM

## 2022-06-15 DIAGNOSIS — Z9889 Other specified postprocedural states: Secondary | ICD-10-CM

## 2022-06-20 ENCOUNTER — Telehealth: Payer: Self-pay | Admitting: Physical Medicine and Rehabilitation

## 2022-06-20 NOTE — Telephone Encounter (Signed)
I called patient this morning to discuss recent thoracic MRI results, unable to reach, did leave VM. Thoracic MRI imaging looks good, no nerve compression/spinal canal stenosis. We would recommend course of physical therapy including dry needling.

## 2022-06-21 ENCOUNTER — Telehealth: Payer: Self-pay | Admitting: Orthopaedic Surgery

## 2022-06-21 NOTE — Telephone Encounter (Signed)
Pt asked  for  a casll back concerning her MRI. Pt is confused on what is next. Please call pt at [phone].

## 2022-07-18 ENCOUNTER — Ambulatory Visit
Admission: RE | Admit: 2022-07-18 | Discharge: 2022-07-18 | Disposition: A | Discharge status: Home / Self Care | Payer: Medicare Other | Source: Ambulatory Visit | Attending: Adult Health | Admitting: Adult Health

## 2022-07-18 ENCOUNTER — Other Ambulatory Visit: Payer: Self-pay | Admitting: Pulmonary Disease

## 2022-07-18 DIAGNOSIS — Z1231 Encounter for screening mammogram for malignant neoplasm of breast: Secondary | ICD-10-CM

## 2022-07-19 ENCOUNTER — Telehealth (HOSPITAL_BASED_OUTPATIENT_CLINIC_OR_DEPARTMENT_OTHER): Payer: Self-pay | Admitting: Family

## 2022-07-19 NOTE — Telephone Encounter (Signed)
Returned call to patient, no answer at home number. Cell number to VM. Left message with information advising no labs needed before her appointment.

## 2022-07-19 NOTE — Telephone Encounter (Signed)
Patient was a walk in this morning and wanted to know is she needs lab work  prior to her visit with Gillian Shields, NP on 08/05/22

## 2022-07-21 ENCOUNTER — Other Ambulatory Visit: Payer: Self-pay | Admitting: Adult Health

## 2022-07-21 DIAGNOSIS — R928 Other abnormal and inconclusive findings on diagnostic imaging of breast: Secondary | ICD-10-CM
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
## 2022-07-25 ENCOUNTER — Telehealth (HOSPITAL_BASED_OUTPATIENT_CLINIC_OR_DEPARTMENT_OTHER): Payer: Self-pay | Admitting: Family

## 2022-07-25 ENCOUNTER — Ambulatory Visit (HOSPITAL_BASED_OUTPATIENT_CLINIC_OR_DEPARTMENT_OTHER): Payer: Medicare Other | Attending: Orthopaedic Surgery | Admitting: Physical Therapy

## 2022-07-25 DIAGNOSIS — M5459 Other low back pain: Secondary | ICD-10-CM | POA: Diagnosis present

## 2022-07-25 DIAGNOSIS — R2689 Other abnormalities of gait and mobility: Secondary | ICD-10-CM | POA: Diagnosis present

## 2022-07-25 DIAGNOSIS — M546 Pain in thoracic spine: Secondary | ICD-10-CM | POA: Diagnosis present

## 2022-07-25 DIAGNOSIS — M6281 Muscle weakness (generalized): Secondary | ICD-10-CM | POA: Insufficient documentation

## 2022-07-25 NOTE — Telephone Encounter (Signed)
Returned call to patient with APP recommendations of continuing same medications and that she can pick up her notebook if she would like or get it at office visit, no answer, phone never went to VM. Will also send a mychart message.

## 2022-07-25 NOTE — Telephone Encounter (Signed)
Miss Cotton dropped off her health log notebook for review. Pertinent data below.   Since last OV 06/03/22 average SBP over 49 reading of 132. Recommend continuation of current antihypertensive regimen as appears to have helped BP control.   Will ask nursing team to call her and let her know she can pick up her notebook if she wishes or we can give it to her at her 08/05/22 appt.   Alver Sorrow, NP

## 2022-07-26 ENCOUNTER — Ambulatory Visit
Admission: RE | Admit: 2022-07-26 | Discharge: 2022-07-26 | Disposition: A | Discharge status: Home / Self Care | Payer: Medicare Other | Source: Ambulatory Visit | Attending: Adult Health | Admitting: Adult Health

## 2022-07-26 ENCOUNTER — Other Ambulatory Visit: Payer: Self-pay | Admitting: Adult Health

## 2022-07-26 ENCOUNTER — Other Ambulatory Visit: Payer: Self-pay

## 2022-07-26 ENCOUNTER — Encounter (HOSPITAL_BASED_OUTPATIENT_CLINIC_OR_DEPARTMENT_OTHER): Payer: Self-pay | Admitting: Physical Therapy

## 2022-07-26 DIAGNOSIS — R928 Other abnormal and inconclusive findings on diagnostic imaging of breast: Secondary | ICD-10-CM

## 2022-07-28 ENCOUNTER — Encounter (HOSPITAL_BASED_OUTPATIENT_CLINIC_OR_DEPARTMENT_OTHER): Payer: Self-pay | Admitting: Physical Therapy

## 2022-07-28 ENCOUNTER — Ambulatory Visit (HOSPITAL_BASED_OUTPATIENT_CLINIC_OR_DEPARTMENT_OTHER): Payer: Medicare Other | Admitting: Physical Therapy

## 2022-07-28 DIAGNOSIS — M5459 Other low back pain: Secondary | ICD-10-CM

## 2022-07-28 DIAGNOSIS — M546 Pain in thoracic spine: Secondary | ICD-10-CM | POA: Diagnosis not present

## 2022-07-28 DIAGNOSIS — M6281 Muscle weakness (generalized): Secondary | ICD-10-CM

## 2022-07-28 DIAGNOSIS — R2689 Other abnormalities of gait and mobility: Secondary | ICD-10-CM
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
## 2022-07-29 ENCOUNTER — Other Ambulatory Visit: Payer: Self-pay

## 2022-07-29 ENCOUNTER — Telehealth: Payer: Self-pay | Admitting: Pulmonary Disease

## 2022-07-29 ENCOUNTER — Inpatient Hospital Stay
Admission: RE | Admit: 2022-07-29 | Discharge: 2022-07-29 | Disposition: A | Discharge status: Home / Self Care | Payer: Medicare Other | Source: Ambulatory Visit | Attending: Adult Health | Admitting: Adult Health

## 2022-07-29 MED ORDER — FLUTICASONE-SALMETEROL 230-21 MCG/ACT IN AERO: 2.0000 | INHALATION_SPRAY | Freq: Two times a day (BID) | RESPIRATORY_TRACT | 0 refills | Status: DC

## 2022-07-29 NOTE — Telephone Encounter (Signed)
Patient states needs refill for Advair. Pharmacy is [facility]. Patient scheduled 08/15/2022 with Dr. Francine Graven. Patient phone number is [phone].

## 2022-07-29 NOTE — Telephone Encounter (Signed)
Pt is aware of med refill. NFN

## 2022-07-29 NOTE — Telephone Encounter (Signed)
ATC X1 LVM for patient. Please advise Advair has been sent to pharmacy and to please keep f/u for any future refills

## 2022-08-01 ENCOUNTER — Other Ambulatory Visit (HOSPITAL_COMMUNITY): Payer: Self-pay

## 2022-08-05 ENCOUNTER — Encounter (HOSPITAL_BASED_OUTPATIENT_CLINIC_OR_DEPARTMENT_OTHER): Payer: Self-pay | Admitting: Family

## 2022-08-05 ENCOUNTER — Ambulatory Visit (INDEPENDENT_AMBULATORY_CARE_PROVIDER_SITE_OTHER): Payer: Medicare Other | Admitting: Family

## 2022-08-05 VITALS — BP 138/72 | HR 66 | Ht 63.5 in | Wt 139.0 lb

## 2022-08-05 DIAGNOSIS — I25118 Atherosclerotic heart disease of native coronary artery with other forms of angina pectoris: Secondary | ICD-10-CM

## 2022-08-05 DIAGNOSIS — Z86718 Personal history of other venous thrombosis and embolism: Secondary | ICD-10-CM

## 2022-08-05 DIAGNOSIS — E785 Hyperlipidemia, unspecified: Secondary | ICD-10-CM

## 2022-08-05 DIAGNOSIS — Z8673 Personal history of transient ischemic attack (TIA), and cerebral infarction without residual deficits: Secondary | ICD-10-CM

## 2022-08-05 DIAGNOSIS — I10 Essential (primary) hypertension: Secondary | ICD-10-CM

## 2022-08-05 NOTE — Patient Instructions (Signed)
Medication Instructions:  Continue your current medications.   *If you need a refill on your cardiac medications before your next appointment, please call your pharmacy*   Lab Work: Your physician recommends that you return for lab work in the next month or so for fasting lipid panel, CMP, CBC  Please return for Lab work. You may come to the...   [facility] (3rd floor) [address], [place], [place] [address]  Open: 8am-Noon and 1pm-4:30pm  Please ring the doorbell on the small table when you exit the elevator and the Lab Tech will come get you  [facility] at [facility] [address], [place], [place] [address] Open: 8am-1pm, then 2pm-4:30pm   [facility]- Please see attached locations sheet stapled to your lab work with address and hours.    If you have labs (blood work) drawn today and your tests are completely normal, you will receive your results only by: MyChart Message (if you have MyChart) OR A paper copy in the mail If you have any lab test that is abnormal or we need to change your treatment, we will call you to review the results.  Follow-Up: At [facility], you and your health needs are our priority.  As part of our continuing mission to provide you with exceptional heart care, we have created designated Provider Care Teams.  These Care Teams include your primary Cardiologist (physician) and Advanced Practice Providers (APPs -  Physician Assistants and Nurse Practitioners) who all work together to provide you with the care you need, when you need it.  We recommend signing up for the patient portal called "MyChart".  Sign up information is provided on this After Visit Summary.  MyChart is used to connect with patients for Virtual Visits (Telemedicine).  Patients are able to view lab/test results, encounter notes, upcoming appointments, etc.  Non-urgent messages can be sent to your provider as well.   To learn more  about what you can do with MyChart, go to [email or web address].    Your next appointment:   6 month(s)  Provider:   Chilton Si, MD or Gillian Shields, NP    Other Instructions  Let us know if your blood pressure at home is consistently more than 130/80.   Your home cuff read +2 high for systolic blood pressure and +8 high for diastolic

## 2022-08-05 NOTE — Progress Notes (Signed)
Office Visit    Patient Name: Alexandria [name] Date of Encounter: 08/05/2022  PCP:  Maxie Better, MD   [facility]  Cardiologist:  Chilton Si, MD  Advanced Practice Provider:  No care team member to display Electrophysiologist:  None      Chief Complaint    Alexandria [name] is a 82 y.o. 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 since age 89   IBS (irritable bowel syndrome)    Long term current use of aromatase inhibitor 05/08/2012   Overactive bladder    Palpitations    Pneumonia    hx of x 2    PONV (postoperative nausea and vomiting)    PVD (peripheral vascular disease) (HCC)    varicose veins - left worse than right   Spinal stenosis    Stress incontinence    Stroke [facility])    problem with expressive communicaiton   Traumatic hemopneumothorax 1982   left   Past Surgical History:  Procedure Laterality Date   ANKLE HARDWARE REMOVAL     right   BREAST BIOPSY  1976   right   BREAST BIOPSY Left 2019   benign lymph node   BREAST LUMPECTOMY  2013   right with snbx   CHOLECYSTECTOMY  1990s   COLONOSCOPY      CYSTOSCOPY  07/05/2005   KNEE SURGERY  1980s   left   KYPHOPLASTY N/A 06/15/2017   Procedure: KYPHOPLASTY T11;  Surgeon: Venita Lick, MD;  Location: [facility] OR;  Service: Orthopedics;  Laterality: N/A;  90 mins   ORIF ANKLE FRACTURE  1980s   right    TOTAL KNEE ARTHROPLASTY Left 12/08/2015   Procedure: LEFT TOTAL KNEE ARTHROPLASTY;  Surgeon: Durene Romans, MD;  Location: [facility] ORS;  Service: Orthopedics;  Laterality: Left;   TRANSOBTURATOR SLING  07/05/2005   TUBAL LIGATION  1976   VEIN SURGERY     vein ablation left leg    Allergies  Allergies  Allergen Reactions   Augmentin [Amoxicillin-Pot Clavulanate] Itching, Rash and Other (See Comments)    PT DEVELOPED SEVERE RASH INVOLVING MUCUS MEMBRANES or SKIN NECROSIS WITH PENICILLINS: #  #  #  YES  #  #  #     Penicillins Hives and Other (See Comments)    Has patient had a PCN reaction causing immediate rash, facial/tongue/throat swelling, SOB or lightheadedness with hypotension: No HAS PT DEVELOPED SEVERE  RASH INVOLVING MUCUS MEMBRANES or SKIN NECROSIS: #  #  #  YES  #  #  #   Has patient had a PCN reaction that required hospitalization: No Has patient had a PCN reaction occurring within the last 10 years: No    Aspirin Hives   Chlorhexidine Gluconate Rash   Chocolate Hives and Other (See Comments)    RUNNY NOSE    Coffee Bean Extract Hives and Other (See Comments)    RUNNY NOSE    Diclofenac Other (See Comments)   Ibuprofen Hives and Other (See Comments)    RUNNY NOSE   Oxycodone Hcl Hives and Nausea And Vomiting   Shrimp [Shellfish Allergy] Hives and Other (See Comments)    RUNNY NOSE    Sulfonamide Derivatives Hives   Lipitor [Atorvastatin Calcium]     Leg Pain/Joint Pain/Back Pain   Methocarbamol Other (See Comments)    History of Present Illness    Alexandria [name] is a 82 y.o. female with a hx of CAD, DVT, anemia, arthritis, anxiety, asthma, acute brainstem infarct, right breast cancer s/p radiation therapy, COPD, DJD,  depression, atrial fibrillation, GERD, hearing loss, glomerulonephritis, hyperlipidemia, hypertension, IBS, pneumonia, PVD (varicose veins), spinal stenosis, left traumatic hemopneumothorax last seen 06/03/22  Prior stress test 2010 with EF 82%, no ischemia nor infarct. Previously evaluated in 2014 by Dr. Antoine Poche after diagnosis of stroke -due to palpitations atenolol was transitioned to Toprol.  Echocardiogram was recommended and performed 05/2012 revealing EF 60%, no RWMA, no cardiac source of emboli.  Myoview 2014 with no evidence of ischemia nor infarct.  Evaluated 11/26/2020 after referral from PCP for hypertension.  Noted prior to a scheduled posterior vaginal wall repair 05/31/2020 she had mechanical fall.  Twisted right ankle and fell flat in her driveway.  Subsequently developed 3 small thrombi above the ventricles in her posterior head.  Another fall where she fell backwards on concrete and fractured T11 vertebrae.  Then tore her left rotator cuff.  Posterior vaginal wall repair was never able to be completed due to these medical issues but she was inclined to have that surgery.  She admitted chest pain which she attributed to GERD.  However also with exertional component and was recommended for ischemic evaluation. She did note previous muscle cramps with Atorvastatin. Cardiac CTA 11/2020 coronary calcium score of 71 placing her in the 47th percentile for age and sex matched control (RCA 25-49%, proximal LAD 0-24%, proximal large first diagonal 25-49%).  FFR with no significant stenosis.  At clinic visit 07/05/2021 her simvastatin was reduced to twice per week due to myalgias.  She was started on Repatha.  Seen 11/04/21 with no anginal symptoms. Simvastatin was reduced to weekly due to myalgias. At visit 01/05/22 chest pain relieved by Imdur but complicated by headache. HCTZ reduced to 12.5mg  daily.   She is seen 05/05/2020 with elevated blood pressure in the setting recently losing her daughter to  breast cancer.  She was asked to monitor at home.  At follow-up 06/03/2022 was awaiting referral to Dr. Alvester Morin for possible thoracic facet denervation.  BP at home is routinely greater than 130/80.  Imdur increased from 15 to 30 mg.  Hydrochlorothiazide 12.5 mg daily, amlodipine 10 mg daily were continued.  She presents today for follow-up.  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  She was participating in [facility] physical therapy. She had mammogram and upcoming breast biopsy 08/10/22. Strong family history of breast cancer including her  daughter who passed away within the last year of breast cancer. Notes her asthma has been bothering her more with upcoming visit in a couple weeks with Dr. Francine Graven. No exertional dyspnea concerning for angina. Back pain has been better recently, enjoying water therapy and PT. Taking Amlodipine, HCTZ in the mornign and Imdur at night. BP at home relatively controlled with SBP 110s-130s most often.   Repeat BP 138/72 Repeat home cuff 142/80  EKGs/Labs/Other Studies Reviewed:   The following studies were reviewed today:  Cardiac CTA 12/04/2018  Aorta:  Normal size.  Mild calcifications.  No dissection.   Aortic Valve: No calcifications.   Coronary Arteries:  Normal coronary origin.  Right dominance.   RCA is a large dominant artery that gives rise to PDA and PLA. There is proximal calcified plaque, 25-49% stenosis.   Left main is a large artery that gives rise to LAD and LCX arteries.   LAD is a large vessel that has proximal calcified plaque, 0-24% stenosis, proximal large first diagonal calcified plaque, 25-49% stenosis.   LCX is a non-dominant artery that gives rise to one large OM1 branch. There is no plaque.   Other findings:   Normal pulmonary vein drainage into the left atrium.   Normal left atrial appendage without a thrombus.   Normal size of the pulmonary artery.   Please see radiology report for non cardiac  findings.   IMPRESSION: 1. Coronary calcium score of 71 (LAD 50, RCA 21). This was 80 percentile for age and sex matched control.   2. Normal coronary origin with right dominance.   3. Moderate CAD in LAD and RCA distribution. Will send for FFR analysis.   4.  Aortic atherosclerosis.   CAD-RADS 2. Mild non-obstructive CAD (25-49%). Consider non-atherosclerotic causes of chest pain. Consider preventive therapy and risk factor modification.  FFR 1. Left Main: No significant stenosis.   2. LAD: No significant stenosis. 3. LCX: No significant stenosis. 4. RCA: No significant stenosis.   IMPRESSION: 1.  CT FFR analysis didn't show any significant stenosis.   LE Venous Reflux 03/04/2020: Summary:  Right:  - No evidence of deep vein thrombosis from the common femoral through the  popliteal veins.  - No evidence of superficial vein thrombosis.  - Popliteal cyst observed.  - The common femoral vein is incompetent.  - The great and small saphenous veins were not visualized, consistent with  history of ablation.     Left:  - No evidence of deep vein thrombosis from the common femoral through the  popliteal veins.  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  - No true great saphenous vein identified.  - The small saphenous vein is not competent.    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  - Findings consistent with age indeterminate superficial vein thrombosis  involving the right superficial veins/varciosities.  - There is no evidence of deep vein thrombosis in the lower extremity.     - No cystic structure found in the popliteal fossa.   EKG: No EKG today  Recent Labs: 11/02/2021: ALT 11; BUN 24; Creatinine, Ser 0.95; Potassium 4.3; Sodium 143 12/03/2021: Hemoglobin 12.1; Platelets 213.0  Recent Lipid Panel     Component Value Date/Time   CHOL 156 11/02/2021 0826   TRIG 102 11/02/2021 0826   HDL 72 11/02/2021 0826   CHOLHDL 2.2 11/02/2021 1610  CHOLHDL 5 07/10/2019 1441   VLDL 36.6 07/10/2019 1441   LDLCALC 66 11/02/2021 0826   LDLDIRECT 183.5 12/17/2008 0953    Home Medications   Current Meds  Medication Sig   acetaminophen (TYLENOL) 650 MG CR tablet Take 650 mg by mouth every 8 (eight) hours as needed for pain.   albuterol (PROAIR HFA) 108 (90 Base) MCG/ACT inhaler Inhale 2 puffs into the lungs every 6 (six) hours as needed for wheezing or shortness of breath.   amLODipine (NORVASC) 10 MG tablet TAKE ONE TABLET EVERY DAY   ARNICA EX Apply 1 application topically daily as needed (pain).   b complex vitamins tablet Take 1 tablet by mouth 2 (two) times daily. PLUS FOLIC ACID PLUS VIT. C   Biotin 5000 MCG CAPS Take 5,000 mcg by mouth daily.    Capsaicin 0.025 % PADS Apply 1 application topically daily as needed (pain).   Cholecalciferol (VITAMIN D-3) 5000 UNITS TABS Take 5,000 Units by mouth daily.    clopidogrel (PLAVIX) 75 MG tablet TAKE ONE TABLET BY MOUTH DAILY   CO-ENZYME Q10-VITAMIN E PO Take 1 tablet by mouth daily.   Diclofenac Sodium (PENNSAID) 2 % SOLN Apply 1 application topically 3 (three) times daily. (Patient taking differently: Apply 1 application  topically as needed.)   diphenhydrAMINE (BENADRYL) 25 MG tablet Take 25 mg by mouth every 6 (six) hours as needed. Patient taking 1/2 (12.5 mg) as needed for allergies and itching   escitalopram (LEXAPRO) 10 MG tablet TAKE ONE TABLET BY MOUTH ONCE DAILY   Evolocumab (REPATHA SURECLICK) 140 MG/ML SOAJ Inject 140 mg into the skin every 14 (fourteen) days.   fluticasone (FLONASE) 50 MCG/ACT nasal spray USE 1 SPRAY IN EACH NOSTRIL DAILY.   fluticasone-salmeterol (ADVAIR HFA) 230-21 MCG/ACT inhaler Inhale 2 puffs into the lungs 2 (two) times daily.   hydrochlorothiazide (MICROZIDE) 12.5 MG capsule TAKE (1) CAPSULE TWICE DAILY.    ipratropium (ATROVENT) 0.03 % nasal spray Place 2 sprays into both nostrils every 12 (twelve) hours.   isosorbide mononitrate (IMDUR) 30 MG 24 hr tablet Take 1 tablet (30 mg total) by mouth daily. Take at bedtime.   loratadine (CLARITIN) 10 MG tablet Take 10 mg by mouth daily as needed for allergies.   Misc Natural Products (OSTEO BI-FLEX ADV TRIPLE ST PO) Take 1 tablet by mouth daily.   montelukast (SINGULAIR) 10 MG tablet Take 1 tablet (10 mg total) by mouth at bedtime.   ondansetron (ZOFRAN-ODT) 4 MG disintegrating tablet DISSOLVE 1 TABLET ON TONGUE EVERY 8 HOURS AS NEEDED FOR NAUSEA/VOMITING.   pantoprazole (PROTONIX) 40 MG tablet TAKE ONE TABLET BY MOUTH DAILY   Polyethyl Glycol-Propyl Glycol (SYSTANE OP) Place 1 drop into both eyes daily as needed. Dry eyes   Probiotic Product (ALIGN PO) Take 1 tablet by mouth daily.   Selenium 200 MCG CAPS Take 200 mcg by mouth 2 (two) times daily.    simvastatin (ZOCOR) 10 MG tablet Take 1 tablet (10 mg total) by mouth once a week.   traMADol (ULTRAM) 50 MG tablet TAKE ONE TABLET BY MOUTH EVERY 8 HOURS AS NEEDED   Trolamine Salicylate (ASPERCREME EX) Apply 1 application topically daily as needed (pain).   UNABLE TO FIND Med Name: Neuriva Brain Supplement   Vibegron 75 MG TABS Take 1 tablet by mouth daily.     Review of Systems      All other systems reviewed and are otherwise negative except as noted above.  Physical Exam    VS:  BP (!) 145/82   Pulse 66   Ht 5' 3.5" (1.613 m)   Wt 139 lb (63 kg)   BMI 24.24 kg/m  , BMI Body mass index is 24.24 kg/m.  Wt Readings from Last 3 Encounters:  08/05/22 139 lb (63 kg)  06/03/22 137 lb (62.1 kg)  05/05/22 138 lb 14.4 oz (63 kg)    GEN: Well nourished, well developed, in no acute distress. HEENT: normal. Neck: Supple, no JVD, carotid bruits, or masses. Cardiac: RRR, no murmurs, rubs, or gallops. No clubbing, cyanosis, edema.  Radials/PT 2+ and equal bilaterally.  Respiratory:  Respirations  regular and unlabored, clear to auscultation bilaterally. GI: Soft, nontender, nondistended. MS: No deformity or atrophy. Skin: Warm and dry, no rash. Neuro:  Strength and sensation are intact. Psych: Normal affect.  Assessment & Plan   CAD - Moderate nonobstructive disease by cardiac CTA 11/2020 with no evidence of significant stenosis by FFR. No anginal symptoms. No indication for ischemic evaluation.  GDMT includes Plavix, simvastatin, Imdur, Repatha, PRN nitroglycerin.  Heart healthy diet and regular cardiovascular exercise encouraged.  Heart healthy diet and regular cardiovascular exercise encouraged.    Hypokalemia - Continue potassium supplement. BMP for monitoring of potassium.   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 11/2020 LE duplex no DVT.  No indication for anticoagulation.  HLD, LDL goal <70 - Did not tolerate Atorvastatin with myalgias. Tolerates Simvastatin once per week. Continue Repatha.update CMP, lipid panel within a month to ensure LDL at goal <70.  HTN - BP mildly elevated in clinic but well controlled at home. Home cuff found to read +2 systolic and +2 diastolic. Home readings SBP 110s-130s. Continue HCTZ 12.5mg  QD, Amlodipine 10mg  QD, Imdur 30mg  QHS. Defer further escalating medications due to episodes of hypotension at home.  Discussed to monitor BP at home at least 2 hours after medications and sitting for 5-10 minutes.   Hx of CVA - Continue Aspirin, statin, Repatha.    Disposition: Follow up in 6 month(s) with Chilton Si, MD or APP.  Signed, Alver Sorrow, NP 08/05/2022, 3:05 PM [facility]

## 2022-08-10 ENCOUNTER — Ambulatory Visit
Admission: RE | Admit: 2022-08-10 | Discharge: 2022-08-10 | Disposition: A | Discharge status: Home / Self Care | Payer: Medicare Other | Source: Ambulatory Visit | Attending: Adult Health | Admitting: Adult Health

## 2022-08-10 DIAGNOSIS — R928 Other abnormal and inconclusive findings on diagnostic imaging of breast: Secondary | ICD-10-CM

## 2022-08-10 HISTORY — PX: BREAST BIOPSY: SHX20

## 2022-08-11 ENCOUNTER — Telehealth: Payer: Self-pay | Admitting: Hematology and Oncology

## 2022-08-11 ENCOUNTER — Ambulatory Visit (HOSPITAL_BASED_OUTPATIENT_CLINIC_OR_DEPARTMENT_OTHER): Payer: Medicare Other | Admitting: Physical Therapy

## 2022-08-11 NOTE — Telephone Encounter (Signed)
Spoke to patient to confirm upcoming afternoon [facility] clinic appointment  on 6/5, paperwork will be sent via mail.   Gave location and time, also informed patient that the surgeon's office would be calling as well to get information from them similar to the packet that they will be receiving so make sure to do both.  Reminded patient that all providers will be coming to the clinic to see them HERE and if they had any questions to not hesitate to reach back out to myself or their navigators. 

## 2022-08-15 ENCOUNTER — Other Ambulatory Visit: Payer: Self-pay | Admitting: Adult Health

## 2022-08-15 ENCOUNTER — Other Ambulatory Visit (HOSPITAL_COMMUNITY): Payer: Self-pay

## 2022-08-15 ENCOUNTER — Ambulatory Visit (INDEPENDENT_AMBULATORY_CARE_PROVIDER_SITE_OTHER): Payer: Medicare Other | Admitting: Pulmonary Disease

## 2022-08-15 ENCOUNTER — Encounter: Payer: Self-pay | Admitting: Pulmonary Disease

## 2022-08-15 VITALS — BP 124/70 | HR 74 | Ht 63.5 in | Wt 139.0 lb

## 2022-08-15 DIAGNOSIS — R0982 Postnasal drip: Secondary | ICD-10-CM | POA: Diagnosis not present

## 2022-08-15 DIAGNOSIS — J4530 Mild persistent asthma, uncomplicated: Secondary | ICD-10-CM

## 2022-08-15 MED ORDER — ALBUTEROL SULFATE HFA 108 (90 BASE) MCG/ACT IN AERS
2.0000 | INHALATION_SPRAY | Freq: Four times a day (QID) | RESPIRATORY_TRACT | 5 refills | Status: DC | PRN
Start: 2022-08-15 — End: 2024-01-09
  Filled 2022-08-15 – 2022-11-07 (×2): qty 6.7, 25d supply, fill #0

## 2022-08-15 MED ORDER — MONTELUKAST SODIUM 10 MG PO TABS
10.0000 mg | ORAL_TABLET | Freq: Every day | ORAL | 3 refills | Status: DC
Start: 2022-08-15 — End: 2023-12-14
  Filled 2022-08-15 – 2022-11-07 (×2): qty 30, 30d supply, fill #0

## 2022-08-15 MED ORDER — FLUTICASONE PROPIONATE 50 MCG/ACT NA SUSP
1.0000 | Freq: Every day | NASAL | 3 refills | Status: DC
Start: 2022-08-15 — End: 2022-08-15

## 2022-08-15 MED ORDER — FLUTICASONE PROPIONATE 50 MCG/ACT NA SUSP
1.0000 | Freq: Every day | NASAL | 3 refills | Status: DC
Start: 2022-08-15 — End: 2023-12-14
  Filled 2022-08-15 – 2022-11-07 (×2): qty 16, 60d supply, fill #0

## 2022-08-15 MED ORDER — IPRATROPIUM BROMIDE 0.03 % NA SOLN
2.0000 | Freq: Two times a day (BID) | NASAL | 0 refills | Status: DC
Start: 2022-08-15 — End: 2023-12-14
  Filled 2022-08-15 – 2022-11-07 (×2): qty 30, 75d supply, fill #0

## 2022-08-15 MED ORDER — ALBUTEROL SULFATE HFA 108 (90 BASE) MCG/ACT IN AERS
2.0000 | INHALATION_SPRAY | Freq: Four times a day (QID) | RESPIRATORY_TRACT | 5 refills | Status: DC | PRN
Start: 2022-08-15 — End: 2022-08-15

## 2022-08-15 MED ORDER — FLUTICASONE-SALMETEROL 230-21 MCG/ACT IN AERO
2.0000 | INHALATION_SPRAY | Freq: Two times a day (BID) | RESPIRATORY_TRACT | 0 refills | Status: DC
Start: 2022-08-15 — End: 2023-12-14
  Filled 2022-08-15 – 2022-11-07 (×2): qty 12, 30d supply, fill #0

## 2022-08-15 MED ORDER — IPRATROPIUM BROMIDE 0.03 % NA SOLN: 2.0000 | Freq: Two times a day (BID) | NASAL | 0 refills | Status: DC

## 2022-08-15 MED ORDER — FLUTICASONE-SALMETEROL 230-21 MCG/ACT IN AERO
2.0000 | INHALATION_SPRAY | Freq: Two times a day (BID) | RESPIRATORY_TRACT | 0 refills | Status: DC
Start: 2022-08-15 — End: 2022-08-15

## 2022-08-15 NOTE — Patient Instructions (Signed)
Continue advair 2 puffs twice daily - rinse mouth out after each use  Continue albuterol inhaler as needed  Continue fluticasone and ipratropium nasal sprays for runny nose and post nasal drainage  Continue montelukast daily  Follow up in 1 year, call sooner if needed

## 2022-08-15 NOTE — Progress Notes (Unsigned)
Synopsis: Self referred in 03/2020 for COPD  Subjective:   PATIENT ID: Alexandria [name] GENDER: female DOB: 04-12-1940, MRN: [id number]  HPI  Chief Complaint  Patient presents with   Follow-up    F/U on allergies. States her has a runny nose for the past few weeks despite using OTC meds.    Alexandria [name] is a 82 year old woman, former smoker with history of asthma who returns to pulmonary clinic for follow up.  OV 07/07/20 She had pulmonary function tests on 05/26/20 which were normal. 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   She also complains of sinus congestion and pressure. She is using advair 230-68mcg 2 puffs twice daily and as needed albuterol.   She is planning to have surgery for a rectocele but this has been delayed due to her recent fall.  Today - OV 08/15/22 She has been having rhinitis over the past few weeks. She is using advair 230-59mcg 2 puffs twice daily and as needed albuterol. Her daughter recently died from breast cancer.    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 since age 29   IBS (irritable bowel  syndrome)    Long term current use of aromatase inhibitor 05/08/2012   Overactive bladder    Palpitations    Pneumonia    hx of x 2    PONV (postoperative nausea and vomiting)    PVD (peripheral vascular disease) (HCC)    varicose veins - left worse than right   Spinal stenosis    Stress incontinence    Stroke (HCC)    problem with expressive communicaiton   Traumatic hemopneumothorax 1982   left     Family History  Problem Relation Age of Onset   Stroke Mother    Heart attack Father        Died age 34   Cancer Maternal Aunt        breast   Cancer Paternal Aunt        Multiple Myeloma   Breast cancer Paternal Aunt        late 50s-60s   Breast cancer Paternal Aunt        bilateral breast cancer   Dementia Paternal Aunt    Stomach cancer Paternal Uncle    Breast cancer Paternal Uncle    Congestive Heart Failure Paternal Uncle        Ischemic  Congestive Heart Failure Paternal Uncle        Ischemic   Heart attack Paternal Grandfather    Breast cancer Cousin        early 77s   Breast cancer Daughter      Social History   Socioeconomic History   Marital status: Widowed    Spouse name: Not on file   Number of children: Not on file   Years of education: Not on file   Highest education level: Not on file  Occupational History   Not on file  Tobacco Use   Smoking status: Former    Packs/day: 0.50    Years: 15.00    Additional pack years: 0.00    Total pack years: 7.50    Types: Cigarettes    Quit date: 08/28/1993    Years since quitting: 28.9   Smokeless tobacco: Never  Vaping Use   Vaping Use: Never used  Substance and Sexual Activity   Alcohol use: No    Alcohol/week: 0.0 standard drinks of alcohol   Drug use: No   Sexual activity: Not Currently  Other Topics Concern   Not on file  Social History Narrative   Widowed since 2014   Retired Set designer    Son and daughter   Social Determinants of Health   Financial Resource Strain: Low Risk   (11/26/2020)   Overall Financial Resource Strain (CARDIA)    Difficulty of Paying Living Expenses: Not hard at all  Food Insecurity: No Food Insecurity (11/26/2020)   Hunger Vital Sign    Worried About Running Out of Food in the Last Year: Never true    Ran Out of Food in the Last Year: Never true  Transportation Needs: No Transportation Needs (11/26/2020)   PRAPARE - Administrator, Civil Service (Medical): No    Lack of Transportation (Non-Medical): No  Physical Activity: Insufficiently Active (11/26/2020)   Exercise Vital Sign    Days of Exercise per Week: 3 days    Minutes of Exercise per Session: 30 min  Stress: Not on file  Social Connections: Not on file  Intimate Partner Violence: Not on file     Allergies  Allergen Reactions   Augmentin [Amoxicillin-Pot Clavulanate] Itching, Rash and Other (See Comments)    PT DEVELOPED SEVERE RASH INVOLVING MUCUS MEMBRANES or SKIN NECROSIS WITH PENICILLINS: #  #  #  YES  #  #  #     Penicillins Hives and Other (See Comments)    Has patient had a PCN reaction causing immediate rash, facial/tongue/throat swelling, SOB or lightheadedness with hypotension: No HAS PT DEVELOPED SEVERE RASH INVOLVING MUCUS MEMBRANES or SKIN NECROSIS: #  #  #  YES  #  #  #   Has patient had a PCN reaction that required hospitalization: No Has patient had a PCN reaction occurring within the last 10 years: No    Aspirin Hives   Chlorhexidine Gluconate Rash   Chocolate Hives and Other (See Comments)    RUNNY NOSE    Coffee Bean Extract Hives and Other (See Comments)    RUNNY NOSE    Diclofenac Other (See Comments)   Ibuprofen Hives and Other (See Comments)    RUNNY NOSE   Oxycodone Hcl Hives and Nausea And Vomiting   Shrimp [Shellfish Allergy] Hives and Other (See Comments)    RUNNY NOSE    Sulfonamide Derivatives Hives   Lipitor [Atorvastatin Calcium]     Leg Pain/Joint Pain/Back Pain  Methocarbamol Other (See Comments)     Outpatient  Medications Prior to Visit  Medication Sig Dispense Refill   acetaminophen (TYLENOL) 650 MG CR tablet Take 650 mg by mouth every 8 (eight) hours as needed for pain.     amLODipine (NORVASC) 10 MG tablet TAKE ONE TABLET EVERY DAY 90 tablet 1   ARNICA EX Apply 1 application topically daily as needed (pain).     b complex vitamins tablet Take 1 tablet by mouth 2 (two) times daily. PLUS FOLIC ACID PLUS VIT. C     Biotin 5000 MCG CAPS Take 5,000 mcg by mouth daily.      Capsaicin 0.025 % PADS Apply 1 application topically daily as needed (pain).     Cholecalciferol (VITAMIN D-3) 5000 UNITS TABS Take 5,000 Units by mouth daily.      clopidogrel (PLAVIX) 75 MG tablet TAKE ONE TABLET BY MOUTH DAILY 90 tablet 3   CO-ENZYME Q10-VITAMIN E PO Take 1 tablet by mouth daily.     Diclofenac Sodium (PENNSAID) 2 % SOLN Apply 1 application topically 3 (three) times daily. (Patient taking differently: Apply 1 application  topically as needed.) 112 g 0   diphenhydrAMINE (BENADRYL) 25 MG tablet Take 25 mg by mouth every 6 (six) hours as needed. Patient taking 1/2 (12.5 mg) as needed for allergies and itching     escitalopram (LEXAPRO) 10 MG tablet TAKE ONE TABLET BY MOUTH ONCE DAILY 90 tablet 1   Evolocumab (REPATHA SURECLICK) 140 MG/ML SOAJ Inject 140 mg into the skin every 14 (fourteen) days. 6 mL 3   hydrochlorothiazide (MICROZIDE) 12.5 MG capsule TAKE (1) CAPSULE TWICE DAILY. 180 capsule 3   isosorbide mononitrate (IMDUR) 30 MG 24 hr tablet Take 1 tablet (30 mg total) by mouth daily. Take at bedtime. 90 tablet 3   loratadine (CLARITIN) 10 MG tablet Take 10 mg by mouth daily as needed for allergies.     Misc Natural Products (OSTEO BI-FLEX ADV TRIPLE ST PO) Take 1 tablet by mouth daily.     nitroGLYCERIN (NITROSTAT) 0.4 MG SL tablet Place 1 tablet (0.4 mg total) under the tongue every 5 (five) minutes as needed for chest pain. 25 tablet 3   ondansetron (ZOFRAN-ODT) 4 MG disintegrating tablet DISSOLVE 1 TABLET ON  TONGUE EVERY 8 HOURS AS NEEDED FOR NAUSEA/VOMITING. 20 tablet 1   pantoprazole (PROTONIX) 40 MG tablet TAKE ONE TABLET BY MOUTH DAILY 90 tablet 3   Polyethyl Glycol-Propyl Glycol (SYSTANE OP) Place 1 drop into both eyes daily as needed. Dry eyes     potassium chloride (KLOR-CON) 10 MEQ tablet Take 2 tablets (20 mEq total) by mouth 2 (two) times daily. 360 tablet 2   Probiotic Product (ALIGN PO) Take 1 tablet by mouth daily.     Selenium 200 MCG CAPS Take 200 mcg by mouth 2 (two) times daily.      simvastatin (ZOCOR) 10 MG tablet Take 1 tablet (10 mg total) by mouth once a week. 13 tablet 3   traMADol (ULTRAM) 50 MG tablet TAKE ONE TABLET BY MOUTH EVERY 8 HOURS AS NEEDED 15 tablet 1   Trolamine Salicylate (ASPERCREME EX) Apply 1 application topically daily as needed (pain).     UNABLE TO FIND Med Name: Neuriva Brain Supplement     Vibegron 75 MG TABS Take 1 tablet by mouth daily.     albuterol (PROAIR HFA) 108 (90 Base) MCG/ACT inhaler Inhale 2 puffs into the lungs every 6 (six) hours as needed for wheezing or  shortness of breath. 8 g 5   fluticasone (FLONASE) 50 MCG/ACT nasal spray USE 1 SPRAY IN EACH NOSTRIL DAILY. 16 g 5   fluticasone-salmeterol (ADVAIR HFA) 230-21 MCG/ACT inhaler Inhale 2 puffs into the lungs 2 (two) times daily. 1 each 0   ipratropium (ATROVENT) 0.03 % nasal spray Place 2 sprays into both nostrils every 12 (twelve) hours. 30 mL 0   montelukast (SINGULAIR) 10 MG tablet Take 1 tablet (10 mg total) by mouth at bedtime. 30 tablet 3   No facility-administered medications prior to visit.    Review of Systems  Constitutional:  Negative for chills, fever, malaise/fatigue and weight loss.  HENT:  Positive for congestion.   Respiratory:  Negative for cough, hemoptysis, sputum production, shortness of breath, wheezing and stridor.   Cardiovascular:  Negative for chest pain, palpitations, orthopnea, claudication, leg swelling and PND.  Gastrointestinal:  Negative for abdominal pain,  constipation, diarrhea, heartburn, nausea and vomiting.  Genitourinary: Negative.   Musculoskeletal: Negative.   Skin:  Negative for rash.  Neurological: Negative.   Endo/Heme/Allergies: Negative.   Psychiatric/Behavioral: Negative.     Objective:   Vitals:   08/15/22 1112  BP: 124/70  Pulse: 74  SpO2: 98%  Weight: 139 lb (63 kg)  Height: 5' 3.5" (1.613 m)   Physical Exam Constitutional:      General: She is not in acute distress.    Appearance: She is normal weight. She is not ill-appearing.  HENT:     Head: Normocephalic and atraumatic.  Eyes:     General: No scleral icterus.    Conjunctiva/sclera: Conjunctivae normal.  Cardiovascular:     Rate and Rhythm: Normal rate and regular rhythm.     Pulses: Normal pulses.     Heart sounds: Normal heart sounds. No murmur heard. Pulmonary:     Effort: Pulmonary effort is normal.     Breath sounds: No wheezing, rhonchi or rales.  Musculoskeletal:     Right lower leg: No edema.     Left lower leg: No edema.  Skin:    General: Skin is warm and dry.  Neurological:     General: No focal deficit present.     Mental Status: She is alert.     CBC    Component Value Date/Time   WBC 6.7 08/17/2022 1208   WBC 5.5 12/03/2021 0906   RBC 3.72 (L) 08/17/2022 1208   HGB 11.2 (L) 08/17/2022 1208   HGB WILL FOLLOW 07/27/2017 1647   HGB 13.1 10/06/2014 1036   HCT 33.3 (L) 08/17/2022 1208   HCT WILL FOLLOW 07/27/2017 1647   HCT 38.7 10/06/2014 1036   PLT 243 08/17/2022 1208   PLT WILL FOLLOW 07/27/2017 1647   MCV 89.5 08/17/2022 1208   MCV WILL FOLLOW 07/27/2017 1647   MCV 89.5 10/06/2014 1036   MCH 30.1 08/17/2022 1208   MCHC 33.6 08/17/2022 1208   RDW 13.4 08/17/2022 1208   RDW WILL FOLLOW 07/27/2017 1647   RDW 14.1 10/06/2014 1036   LYMPHSABS 2.3 08/17/2022 1208   LYMPHSABS WILL FOLLOW 07/27/2017 1647   LYMPHSABS 1.8 10/06/2014 1036   MONOABS 0.6 08/17/2022 1208   MONOABS 0.5 10/06/2014 1036   EOSABS 0.1 08/17/2022 1208    EOSABS WILL FOLLOW 07/27/2017 1647   BASOSABS 0.0 08/17/2022 1208   BASOSABS WILL FOLLOW 07/27/2017 1647   BASOSABS 0.1 10/06/2014 1036      Latest Ref Rng & Units 08/17/2022   12:08 PM 11/02/2021    8:26 AM 07/05/2021  9:54 AM  BMP  Glucose 70 - 99 mg/dL 92  87  90   BUN 8 - 23 mg/dL 21  24  14    Creatinine 0.44 - 1.00 mg/dL 1.61  0.96  0.45   BUN/Creat Ratio 12 - 28  25  16    Sodium 135 - 145 mmol/L 140  143  142   Potassium 3.5 - 5.1 mmol/L 3.6  4.3  4.3   Chloride 98 - 111 mmol/L 102  105  104   CO2 22 - 32 mmol/L 30  24  26    Calcium 8.9 - 10.3 mg/dL 9.8  9.8  9.8    Chest imaging: CXR 02/13/20 The lungs are clear. Heart size is normal. No pneumothorax or pleural effusion. Aortic atherosclerosis. Surgical clips projecting over the right breast and chronic deformity of left ribs again seen.  PFT:    Latest Ref Rng & Units 05/26/2020   11:50 AM  PFT Results  FVC-Pre L 2.41   FVC-Predicted Pre % 90   FVC-Post L 2.49   FVC-Predicted Post % 93   Pre FEV1/FVC % % 81   Post FEV1/FCV % % 80   FEV1-Pre L 1.95   FEV1-Predicted Pre % 97   FEV1-Post L 1.99   DLCO uncorrected ml/min/mmHg 16.91   DLCO UNC% % 90   DLCO corrected ml/min/mmHg 16.91   DLCO COR %Predicted % 90   DLVA Predicted % 104   TLC L 4.79   TLC % Predicted % 96   RV % Predicted % 95   05/26/20: Normal PFTs    Assessment & Plan:   Mild persistent asthma without complication - Plan: albuterol (PROAIR HFA) 108 (90 Base) MCG/ACT inhaler, fluticasone-salmeterol (ADVAIR HFA) 230-21 MCG/ACT inhaler, DISCONTINUED: albuterol (PROAIR HFA) 108 (90 Base) MCG/ACT inhaler, DISCONTINUED: fluticasone-salmeterol (ADVAIR HFA) 230-21 MCG/ACT inhaler  Post-nasal drip - Plan: fluticasone (FLONASE) 50 MCG/ACT nasal spray, ipratropium (ATROVENT) 0.03 % nasal spray, DISCONTINUED: fluticasone (FLONASE) 50 MCG/ACT nasal spray, DISCONTINUED: ipratropium (ATROVENT) 0.03 % nasal spray  Mild persistent reactive airway disease  without complication - Plan: montelukast (SINGULAIR) 10 MG tablet  Discussion: Page Danielski is a 82 year old woman, former smoker  who returns to pulmonary clinic for follow up.  She has persistent reactive airways disease which seem to be aggravated by her GERD and post-nasal drainage.   She is to continue on pantoprazole 40mg  daily for GERD.   She is to use flonase 2 sprays per nostril daily, start ipratropium nasal spray 2 sprays per nostril twice daily. She is to continue montelukast 10mg  daily. She is to continue her Advair 230-60mcg 2 puffs twice daily and as needed albuterol.   Follow up in 1 year.  Melody Comas, MD [facility] Office: [phone]   Current Outpatient Medications:    acetaminophen (TYLENOL) 650 MG CR tablet, Take 650 mg by mouth every 8 (eight) hours as needed for pain., Disp: , Rfl:    albuterol (PROAIR HFA) 108 (90 Base) MCG/ACT inhaler, Inhale 2 puffs into the lungs every 6 (six) hours as needed for wheezing or shortness of breath., Disp: 8 g, Rfl: 5   amLODipine (NORVASC) 10 MG tablet, TAKE ONE TABLET EVERY DAY, Disp: 90 tablet, Rfl: 1   ARNICA EX, Apply 1 application topically daily as needed (pain)., Disp: , Rfl:    b complex vitamins tablet, Take 1 tablet by mouth 2 (two) times daily. PLUS FOLIC ACID PLUS VIT. C, Disp: , Rfl:  Biotin 5000 MCG CAPS, Take 5,000 mcg by mouth daily. , Disp: , Rfl:    Capsaicin 0.025 % PADS, Apply 1 application topically daily as needed (pain)., Disp: , Rfl:    Cholecalciferol (VITAMIN D-3) 5000 UNITS TABS, Take 5,000 Units by mouth daily. , Disp: , Rfl:    clopidogrel (PLAVIX) 75 MG tablet, TAKE ONE TABLET BY MOUTH DAILY, Disp: 90 tablet, Rfl: 3   CO-ENZYME Q10-VITAMIN E PO, Take 1 tablet by mouth daily., Disp: , Rfl:    Diclofenac Sodium (PENNSAID) 2 % SOLN, Apply 1 application topically 3 (three) times daily. (Patient taking differently: Apply 1 application  topically as needed.), Disp: 112 g, Rfl:  0   diphenhydrAMINE (BENADRYL) 25 MG tablet, Take 25 mg by mouth every 6 (six) hours as needed. Patient taking 1/2 (12.5 mg) as needed for allergies and itching, Disp: , Rfl:    escitalopram (LEXAPRO) 10 MG tablet, TAKE ONE TABLET BY MOUTH ONCE DAILY, Disp: 90 tablet, Rfl: 1   Evolocumab (REPATHA SURECLICK) 140 MG/ML SOAJ, Inject 140 mg into the skin every 14 (fourteen) days., Disp: 6 mL, Rfl: 3   fluticasone (FLONASE) 50 MCG/ACT nasal spray, Place 1 spray into both nostrils daily., Disp: 16 g, Rfl: 3   fluticasone-salmeterol (ADVAIR HFA) 230-21 MCG/ACT inhaler, Inhale 2 puffs into the lungs 2 (two) times daily., Disp: 12 g, Rfl: 0   hydrochlorothiazide (MICROZIDE) 12.5 MG capsule, TAKE (1) CAPSULE TWICE DAILY., Disp: 180 capsule, Rfl: 3   ipratropium (ATROVENT) 0.03 % nasal spray, Place 2 sprays into both nostrils every 12 (twelve) hours., Disp: 30 mL, Rfl: 0   isosorbide mononitrate (IMDUR) 30 MG 24 hr tablet, Take 1 tablet (30 mg total) by mouth daily. Take at bedtime., Disp: 90 tablet, Rfl: 3   loratadine (CLARITIN) 10 MG tablet, Take 10 mg by mouth daily as needed for allergies., Disp: , Rfl:    Misc Natural Products (OSTEO BI-FLEX ADV TRIPLE ST PO), Take 1 tablet by mouth daily., Disp: , Rfl:    montelukast (SINGULAIR) 10 MG tablet, Take 1 tablet (10 mg total) by mouth at bedtime., Disp: 30 tablet, Rfl: 3   nitroGLYCERIN (NITROSTAT) 0.4 MG SL tablet, Place 1 tablet (0.4 mg total) under the tongue every 5 (five) minutes as needed for chest pain., Disp: 25 tablet, Rfl: 3   ondansetron (ZOFRAN-ODT) 4 MG disintegrating tablet, DISSOLVE 1 TABLET ON TONGUE EVERY 8 HOURS AS NEEDED FOR NAUSEA/VOMITING., Disp: 20 tablet, Rfl: 1   pantoprazole (PROTONIX) 40 MG tablet, TAKE ONE TABLET BY MOUTH DAILY, Disp: 90 tablet, Rfl: 3   Polyethyl Glycol-Propyl Glycol (SYSTANE OP), Place 1 drop into both eyes daily as needed. Dry eyes, Disp: , Rfl:    potassium chloride (KLOR-CON) 10 MEQ tablet, Take 2 tablets (20  mEq total) by mouth 2 (two) times daily., Disp: 360 tablet, Rfl: 2   Probiotic Product (ALIGN PO), Take 1 tablet by mouth daily., Disp: , Rfl:    Selenium 200 MCG CAPS, Take 200 mcg by mouth 2 (two) times daily. , Disp: , Rfl:    simvastatin (ZOCOR) 10 MG tablet, Take 1 tablet (10 mg total) by mouth once a week., Disp: 13 tablet, Rfl: 3   traMADol (ULTRAM) 50 MG tablet, TAKE ONE TABLET BY MOUTH EVERY 8 HOURS AS NEEDED, Disp: 15 tablet, Rfl: 1   Trolamine Salicylate (ASPERCREME EX), Apply 1 application topically daily as needed (pain)., Disp: , Rfl:    UNABLE TO FIND, Med Name: Neuriva Brain Supplement, Disp: ,  Rfl:    Vibegron 75 MG TABS, Take 1 tablet by mouth daily., Disp: , Rfl:

## 2022-08-16 ENCOUNTER — Encounter: Payer: Self-pay | Admitting: *Deleted

## 2022-08-16 ENCOUNTER — Ambulatory Visit (HOSPITAL_BASED_OUTPATIENT_CLINIC_OR_DEPARTMENT_OTHER): Payer: Medicare Other | Admitting: Physical Therapy

## 2022-08-16 ENCOUNTER — Other Ambulatory Visit (HOSPITAL_COMMUNITY): Payer: Self-pay

## 2022-08-16 DIAGNOSIS — C50511 Malignant neoplasm of lower-outer quadrant of right female breast: Secondary | ICD-10-CM | POA: Insufficient documentation

## 2022-08-17 ENCOUNTER — Inpatient Hospital Stay: Payer: Medicare Other

## 2022-08-17 ENCOUNTER — Ambulatory Visit
Admission: RE | Admit: 2022-08-17 | Discharge: 2022-08-17 | Disposition: A | Discharge status: Home / Self Care | Payer: Medicare Other | Source: Ambulatory Visit | Attending: Radiation Oncology | Admitting: Radiation Oncology

## 2022-08-17 ENCOUNTER — Ambulatory Visit: Payer: Self-pay | Admitting: Surgery

## 2022-08-17 ENCOUNTER — Other Ambulatory Visit: Payer: Self-pay

## 2022-08-17 ENCOUNTER — Inpatient Hospital Stay (HOSPITAL_BASED_OUTPATIENT_CLINIC_OR_DEPARTMENT_OTHER): Payer: Medicare Other | Admitting: Genetic Counselor

## 2022-08-17 ENCOUNTER — Ambulatory Visit: Payer: Medicare Other | Admitting: Physical Therapy

## 2022-08-17 ENCOUNTER — Encounter: Payer: Self-pay | Admitting: Pulmonary Disease

## 2022-08-17 ENCOUNTER — Encounter: Payer: Self-pay | Admitting: *Deleted

## 2022-08-17 ENCOUNTER — Encounter: Payer: Self-pay | Admitting: Genetic Counselor

## 2022-08-17 ENCOUNTER — Inpatient Hospital Stay: Payer: Medicare Other | Attending: Hematology and Oncology | Admitting: Hematology and Oncology

## 2022-08-17 VITALS — BP 145/79 | HR 79 | Temp 97.5°F | Resp 18 | Ht 63.5 in | Wt 138.0 lb

## 2022-08-17 DIAGNOSIS — Z170 Estrogen receptor positive status [ER+]: Secondary | ICD-10-CM

## 2022-08-17 DIAGNOSIS — Z79811 Long term (current) use of aromatase inhibitors: Secondary | ICD-10-CM | POA: Insufficient documentation

## 2022-08-17 DIAGNOSIS — Z803 Family history of malignant neoplasm of breast: Secondary | ICD-10-CM

## 2022-08-17 DIAGNOSIS — C50911 Malignant neoplasm of unspecified site of right female breast: Secondary | ICD-10-CM

## 2022-08-17 DIAGNOSIS — C50511 Malignant neoplasm of lower-outer quadrant of right female breast: Secondary | ICD-10-CM

## 2022-08-17 DIAGNOSIS — C50211 Malignant neoplasm of upper-inner quadrant of right female breast: Secondary | ICD-10-CM | POA: Insufficient documentation

## 2022-08-17 DIAGNOSIS — Z923 Personal history of irradiation: Secondary | ICD-10-CM | POA: Insufficient documentation

## 2022-08-17 DIAGNOSIS — Z1379 Encounter for other screening for genetic and chromosomal anomalies: Secondary | ICD-10-CM | POA: Insufficient documentation

## 2022-08-17 DIAGNOSIS — Z780 Asymptomatic menopausal state: Secondary | ICD-10-CM

## 2022-08-17 LAB — CBC WITH DIFFERENTIAL (CANCER CENTER ONLY)
Abs Immature Granulocytes: 0.01 10*3/uL (ref 0.00–0.07)
Basophils Absolute: 0 10*3/uL (ref 0.0–0.1)
Basophils Relative: 1 %
Eosinophils Absolute: 0.1 10*3/uL (ref 0.0–0.5)
Eosinophils Relative: 2 %
HCT: 33.3 % — ABNORMAL LOW (ref 36.0–46.0)
Hemoglobin: 11.2 g/dL — ABNORMAL LOW (ref 12.0–15.0)
Immature Granulocytes: 0 %
Lymphocytes Relative: 35 %
Lymphs Abs: 2.3 10*3/uL (ref 0.7–4.0)
MCH: 30.1 pg (ref 26.0–34.0)
MCHC: 33.6 g/dL (ref 30.0–36.0)
MCV: 89.5 fL (ref 80.0–100.0)
Monocytes Absolute: 0.6 10*3/uL (ref 0.1–1.0)
Monocytes Relative: 9 %
Neutro Abs: 3.6 10*3/uL (ref 1.7–7.7)
Neutrophils Relative %: 53 %
Platelet Count: 243 10*3/uL (ref 150–400)
RBC: 3.72 MIL/uL — ABNORMAL LOW (ref 3.87–5.11)
RDW: 13.4 % (ref 11.5–15.5)
WBC Count: 6.7 10*3/uL (ref 4.0–10.5)
nRBC: 0 % (ref 0.0–0.2)

## 2022-08-17 LAB — CMP (CANCER CENTER ONLY)
ALT: 11 U/L (ref 0–44)
AST: 18 U/L (ref 15–41)
Albumin: 4.4 g/dL (ref 3.5–5.0)
Alkaline Phosphatase: 53 U/L (ref 38–126)
Anion gap: 8 (ref 5–15)
BUN: 21 mg/dL (ref 8–23)
CO2: 30 mmol/L (ref 22–32)
Calcium: 9.8 mg/dL (ref 8.9–10.3)
Chloride: 102 mmol/L (ref 98–111)
Creatinine: 1.03 mg/dL — ABNORMAL HIGH (ref 0.44–1.00)
GFR, Estimated: 55 mL/min — ABNORMAL LOW (ref >60)
Glucose, Bld: 92 mg/dL (ref 70–99)
Potassium: 3.6 mmol/L (ref 3.5–5.1)
Sodium: 140 mmol/L (ref 135–145)
Total Bilirubin: 1.1 mg/dL (ref 0.3–1.2)
Total Protein: 7.2 g/dL (ref 6.5–8.1)

## 2022-08-17 LAB — GENETIC SCREENING ORDER

## 2022-08-17 NOTE — Assessment & Plan Note (Signed)
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 N0 M0 stage IA Oncotype DX score 9 7%ROR: Adjuvant radiation followed by 5 years of anastrozole)  Treatment plan: Right lumpectomy Antiestrogen therapy for 5 more years

## 2022-08-17 NOTE — Research (Signed)
Trial:  [employer] 2021-05 - Specimen Collection Study to Evaluate Biomarkers in Subjects with Cancer   Patient Alexandria [name] was identified by Dr. Pamelia Hoit as a potential candidate for the above listed study.  This Clinical Research Nurse met with KRYSTELLE FOSKETT, [id number], on 08/17/22 in a manner and location that ensures patient privacy to discuss participation in the above listed research study.  Patient is Unaccompanied.  A copy of the informed consent document with embedded HIPAA language was provided to the patient.  Patient reads, speaks, and understands [place].   Patient was provided with the business card of this Nurse and encouraged to contact the research team with any questions.  Approximately 10 minutes were spent with the patient reviewing the informed consent documents.  Patient was provided the option of taking informed consent documents home to review and was encouraged to review at their convenience with their support network, including other care providers. Patient took the consent documents home to review. Patient agreed for follow up call from research on Monday 08/22/22.  Domenica Reamer, BSN, RN, [employer] Clinical Research Nurse II [phone] 08/17/2022 3:15 PM

## 2022-08-17 NOTE — Progress Notes (Signed)
[facility] CONSULT NOTE  Patient Care Team: Maxie Better, MD as PCP - General (Obstetrics and Gynecology) Chilton Si, MD as PCP - Cardiology (Cardiology) Charna Elizabeth, MD as Consulting Physician (Gastroenterology) Sherre Scarlet, MD as Referring Physician (Obstetrics and Gynecology) Serena Croissant, MD as Consulting Physician (Hematology and Oncology) Harriette Bouillon, MD as Consulting Physician (General Surgery) Dorothy Puffer, MD as Consulting Physician (Radiation Oncology) Pershing Proud, RN as Oncology Nurse Navigator Donnelly Angelica, RN as Oncology Nurse Navigator  CHIEF COMPLAINTS/PURPOSE OF CONSULTATION:  Newly diagnosed right breast cancer  HISTORY OF PRESENTING ILLNESS:  Alexandria [name] 82 y.o. female is here because of recent diagnosis of right breast cancer.  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  She underwent lumpectomy radiation and 5 years of antiestrogen therapy that was completed in 2019.  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  She comes in today for a multidisciplinary clinic to discuss her treatment plan.  I reviewed her records extensively and collaborated the history with the patient.  SUMMARY OF ONCOLOGIC HISTORY: Oncology History  Breast cancer of upper-inner quadrant of right female breast (HCC)  01/04/2012 Surgery   Right breast lumpectomy and a 1.6 cm IDC with negative margins, 1 SLN negative grade 1 with low-grade DCIS, ER 100%, PR 0%, HER-2 negative ratio 1.14, Ki-67 27% T1 C. N0 M0 stage IA Oncotype DX score 9 7%ROR   02/28/2012 - 04/04/2012 Radiation Therapy   Adjuvant radiation therapy   05/08/2012 - 04/17/2017 Anti-estrogen  oral therapy   Arimidex 1 mg daily  5 years is the treatment plan   08/10/2022 Relapse/Recurrence   Screening mammogram detected right breast calcifications 1.8 cm, stereotactic biopsy: Grade 1 invasive lobular cancer with LCIS ER 100%, PR 0%, Ki67 1%, HER2 -0    Malignant neoplasm of lower-outer quadrant of right breast of female, estrogen receptor positive (HCC)  08/16/2022 Initial Diagnosis   Malignant neoplasm of lower-outer quadrant of right breast of female, estrogen receptor positive (HCC)   08/17/2022 Cancer Staging   Staging form: Breast, AJCC 8th Edition - Clinical stage from 08/17/2022: Stage IA (cT1c, cN0, cM0, G2, ER+, PR-, HER2-) - Signed by Ronny Bacon, PA-C on 08/17/2022 Stage prefix: Initial diagnosis Method of lymph node assessment: Clinical Histologic grading system: 3 grade system      MEDICAL HISTORY:  Past Medical History:  Diagnosis Date   Allergy    Anemia    hx of    Anxiety    Arthritis    left knee, hands, back   Asthma    daily and prn inhalers   Atypical chest pain 11/26/2020   Brainstem infarct, 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 since age 49   IBS (irritable bowel syndrome)    Long term current use of aromatase inhibitor 05/08/2012   Overactive bladder    Palpitations    Pneumonia    hx of x 2    PONV (postoperative nausea and vomiting)    PVD (peripheral vascular disease) (HCC)    varicose veins - left worse than right   Spinal stenosis     Stress incontinence    Stroke [facility])    problem with expressive communicaiton   Traumatic hemopneumothorax 1982   left    SURGICAL HISTORY: Past Surgical History:  Procedure Laterality Date   ANKLE HARDWARE REMOVAL     right   BREAST BIOPSY  1976   right   BREAST BIOPSY Left 2019   benign lymph node   BREAST BIOPSY Right 08/10/2022   MM RT BREAST BX W LOC DEV 1ST LESION IMAGE BX SPEC STEREO GUIDE 08/10/2022 [facility]   BREAST LUMPECTOMY  2013   right with snbx   CHOLECYSTECTOMY  1990s   COLONOSCOPY     CYSTOSCOPY  07/05/2005   KNEE SURGERY  1980s   left   KYPHOPLASTY N/A 06/15/2017   Procedure: KYPHOPLASTY T11;  Surgeon: Venita Lick, MD;  Location: [facility] OR;  Service: Orthopedics;  Laterality: N/A;  90 mins   ORIF ANKLE FRACTURE  1980s   right    TOTAL KNEE ARTHROPLASTY Left 12/08/2015   Procedure: LEFT TOTAL KNEE ARTHROPLASTY;  Surgeon: Durene Romans, MD;  Location: [facility] ORS;  Service: Orthopedics;  Laterality: Left;   TRANSOBTURATOR SLING  07/05/2005   TUBAL LIGATION  1976   VEIN SURGERY     vein ablation left leg    SOCIAL HISTORY: Social History   Socioeconomic History   Marital status: Widowed    Spouse name: Not on file   Number of children: Not on file   Years of education: Not on file   Highest education level: Not on file  Occupational History   Not on file  Tobacco Use   Smoking status: Former    Packs/day: 0.50    Years: 15.00    Additional pack years: 0.00    Total pack years: 7.50    Types: Cigarettes    Quit date: 08/28/1993    Years since quitting: 28.9   Smokeless tobacco: Never  Vaping Use   Vaping Use: Never used  Substance and Sexual Activity   Alcohol use: No    Alcohol/week: 0.0 standard drinks of alcohol   Drug use: No   Sexual activity: Not Currently  Other Topics Concern   Not on file  Social History Narrative   Widowed since 2014   Retired Set designer    Son and daughter   Social Determinants of Health   Financial  Resource Strain: Low Risk  (11/26/2020)   Overall Financial Resource Strain (CARDIA)    Difficulty of Paying Living Expenses: Not hard at all  Food Insecurity: No Food Insecurity (11/26/2020)   Hunger Vital Sign    Worried About Running Out of Food in the Last Year: Never true    Ran Out of Food in the Last Year: Never true  Transportation Needs: No Transportation Needs (11/26/2020)   PRAPARE - Administrator, Civil Service (Medical): No    Lack of Transportation (Non-Medical): No  Physical Activity: Insufficiently Active (11/26/2020)  Exercise Vital Sign    Days of Exercise per Week: 3 days    Minutes of Exercise per Session: 30 min  Stress: Not on file  Social Connections: Not on file  Intimate Partner Violence: Not on file    FAMILY HISTORY: Family History  Problem Relation Age of Onset   Stroke Mother    Heart attack Father        Died age 17   Cancer Maternal Aunt        breast   Cancer Paternal Aunt        Multiple Myeloma   Breast cancer Paternal Aunt        late 50s-60s   Breast cancer Paternal Aunt        bilateral breast cancer   Dementia Paternal Aunt    Stomach cancer Paternal Uncle    Breast cancer Paternal Uncle    Congestive Heart Failure Paternal Uncle        Ischemic   Congestive Heart Failure Paternal Uncle        Ischemic   Heart attack Paternal Grandfather    Breast cancer Cousin        early 89s   Breast cancer Daughter     ALLERGIES:  is allergic to augmentin [amoxicillin-pot clavulanate], penicillins, aspirin, chlorhexidine gluconate, chocolate, coffee bean extract, diclofenac, ibuprofen, oxycodone hcl, shrimp [shellfish allergy], sulfonamide derivatives, lipitor [atorvastatin calcium], and methocarbamol.  MEDICATIONS:  Current Outpatient Medications  Medication Sig Dispense Refill   acetaminophen (TYLENOL) 650 MG CR tablet Take 650 mg by mouth every 8 (eight) hours as needed for pain.     albuterol (PROAIR HFA) 108 (90 Base) MCG/ACT  inhaler Inhale 2 puffs into the lungs every 6 (six) hours as needed for wheezing or shortness of breath. 8 g 5   amLODipine (NORVASC) 10 MG tablet TAKE ONE TABLET EVERY DAY 90 tablet 1   ARNICA EX Apply 1 application topically daily as needed (pain).     b complex vitamins tablet Take 1 tablet by mouth 2 (two) times daily. PLUS FOLIC ACID PLUS VIT. C     Biotin 5000 MCG CAPS Take 5,000 mcg by mouth daily.      Capsaicin 0.025 % PADS Apply 1 application topically daily as needed (pain).     Cholecalciferol (VITAMIN D-3) 5000 UNITS TABS Take 5,000 Units by mouth daily.      clopidogrel (PLAVIX) 75 MG tablet TAKE ONE TABLET BY MOUTH DAILY 90 tablet 3   CO-ENZYME Q10-VITAMIN E PO Take 1 tablet by mouth daily.     Diclofenac Sodium (PENNSAID) 2 % SOLN Apply 1 application topically 3 (three) times daily. (Patient taking differently: Apply 1 application  topically as needed.) 112 g 0   diphenhydrAMINE (BENADRYL) 25 MG tablet Take 25 mg by mouth every 6 (six) hours as needed. Patient taking 1/2 (12.5 mg) as needed for allergies and itching     escitalopram (LEXAPRO) 10 MG tablet TAKE ONE TABLET BY MOUTH ONCE DAILY 90 tablet 1   Evolocumab (REPATHA SURECLICK) 140 MG/ML SOAJ Inject 140 mg into the skin every 14 (fourteen) days. 6 mL 3   fluticasone (FLONASE) 50 MCG/ACT nasal spray Place 1 spray into both nostrils daily. 16 g 3   fluticasone-salmeterol (ADVAIR HFA) 230-21 MCG/ACT inhaler Inhale 2 puffs into the lungs 2 (two) times daily. 12 g 0   hydrochlorothiazide (MICROZIDE) 12.5 MG capsule TAKE (1) CAPSULE TWICE DAILY. 180 capsule 3   ipratropium (ATROVENT) 0.03 % nasal spray Place 2  sprays into both nostrils every 12 (twelve) hours. 30 mL 0   isosorbide mononitrate (IMDUR) 30 MG 24 hr tablet Take 1 tablet (30 mg total) by mouth daily. Take at bedtime. 90 tablet 3   loratadine (CLARITIN) 10 MG tablet Take 10 mg by mouth daily as needed for allergies.     Misc Natural Products (OSTEO BI-FLEX ADV TRIPLE ST  PO) Take 1 tablet by mouth daily.     montelukast (SINGULAIR) 10 MG tablet Take 1 tablet (10 mg total) by mouth at bedtime. 30 tablet 3   nitroGLYCERIN (NITROSTAT) 0.4 MG SL tablet Place 1 tablet (0.4 mg total) under the tongue every 5 (five) minutes as needed for chest pain. 25 tablet 3   ondansetron (ZOFRAN-ODT) 4 MG disintegrating tablet DISSOLVE 1 TABLET ON TONGUE EVERY 8 HOURS AS NEEDED FOR NAUSEA/VOMITING. 20 tablet 1   pantoprazole (PROTONIX) 40 MG tablet TAKE ONE TABLET BY MOUTH DAILY 90 tablet 3   Polyethyl Glycol-Propyl Glycol (SYSTANE OP) Place 1 drop into both eyes daily as needed. Dry eyes     potassium chloride (KLOR-CON) 10 MEQ tablet Take 2 tablets (20 mEq total) by mouth 2 (two) times daily. 360 tablet 2   Probiotic Product (ALIGN PO) Take 1 tablet by mouth daily.     Selenium 200 MCG CAPS Take 200 mcg by mouth 2 (two) times daily.      simvastatin (ZOCOR) 10 MG tablet Take 1 tablet (10 mg total) by mouth once a week. 13 tablet 3   traMADol (ULTRAM) 50 MG tablet TAKE ONE TABLET BY MOUTH EVERY 8 HOURS AS NEEDED 15 tablet 1   Trolamine Salicylate (ASPERCREME EX) Apply 1 application topically daily as needed (pain).     UNABLE TO FIND Med Name: Neuriva Brain Supplement     Vibegron 75 MG TABS Take 1 tablet by mouth daily.     No current facility-administered medications for this visit.    REVIEW OF SYSTEMS:   Constitutional: Denies fevers, chills or abnormal night sweats Breast: Significant bruising and bleeding from recent biopsy causing hardening of the area of breast cancer as well as extensive ecchymosis involving the right and left chest walls All other systems were reviewed with the patient and are negative.  PHYSICAL EXAMINATION: ECOG PERFORMANCE STATUS: 1 - Symptomatic but completely ambulatory  Vitals:   08/17/22 1241  BP: (!) 145/79  Pulse: 79  Resp: 18  Temp: (!) 97.5 F (36.4 C)  SpO2: 100%   Filed Weights   08/17/22 1241  Weight: 138 lb (62.6 kg)     GENERAL:alert, no distress and comfortable BREAST: Large palpable mass in the right breast with extensive hematoma and ecchymosis. No palpable axillary or supraclavicular lymphadenopathy (exam performed in the presence of a chaperone)   LABORATORY DATA:  I have reviewed the data as listed Lab Results  Component Value Date   WBC 6.7 08/17/2022   HGB 11.2 (L) 08/17/2022   HCT 33.3 (L) 08/17/2022   MCV 89.5 08/17/2022   PLT 243 08/17/2022   Lab Results  Component Value Date   NA 140 08/17/2022   K 3.6 08/17/2022   CL 102 08/17/2022   CO2 30 08/17/2022    RADIOGRAPHIC STUDIES: I have personally reviewed the radiological reports and agreed with the findings in the report.  ASSESSMENT AND PLAN:  Breast cancer of upper-inner quadrant of right female breast (HCC) 08/10/2022:Screening mammogram detected right breast calcifications 1.8 cm, stereotactic biopsy: Grade 1 invasive lobular cancer with LCIS ER 100%,  PR 0%, Ki67 1%, HER2 -0  (01/04/2012:Right breast lumpectomy and a 1.6 cm IDC with negative margins, 1 SLN negative grade 1 with low-grade DCIS, ER 100%, PR 0%, HER-2 negative ratio 1.14, Ki-67 27% T1 C. N0 M0 stage IA Oncotype DX score 9 7%ROR: Adjuvant radiation followed by 5 years of anastrozole)  Treatment plan: Right lumpectomy Antiestrogen therapy for 5 more years  All questions were answered. The patient knows to call the clinic with any problems, questions or concerns.    Tamsen Meek, MD 08/17/22

## 2022-08-17 NOTE — Progress Notes (Signed)
Radiation Oncology         ([phone] ________________________________  Name: Alexandria [name]        MRN: [id number]  Date of Service: 08/17/2022 DOB: [birth year]  PI:RJJOACZ, Alexandria Jordan, MD  Harriette Bouillon, MD     REFERRING PHYSICIAN: Harriette Bouillon, MD   DIAGNOSIS: The primary encounter diagnosis was Malignant neoplasm of upper-inner quadrant of right breast in female, estrogen receptor positive (HCC). A diagnosis of Malignant neoplasm of lower-outer quadrant of right breast of female, estrogen receptor positive (HCC) was also pertinent to this visit.   HISTORY OF PRESENT ILLNESS: Alexandria [name] is a 82 y.o. female seen in the multidisciplinary breast clinic for a new diagnosis of right breast cancer. 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 Recently she was noted to have a screening group of calcifications int he right breast. She had diagnostic work up that measured calcifications in the lower outer quadrant measuring 1.8 cm and her axilla was negative for adenopathy. 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 The invasive cancer was ER positive, PR negative, and HER2 negative with a Ki 67 of 1%. She's seen to discuss treatment recommendations of her cancer.     PREVIOUS RADIATION THERAPY: Yes   02/28/12-04/13/12: The patient completed adjuvant radiation to 40 Gy over 16 fractions with a 12.5 Gy boost in 5 fractions. She received her care with Dr. Michell Heinrich.   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 since age 17   IBS (irritable bowel syndrome)    Long term current use of aromatase inhibitor 05/08/2012   Overactive bladder    Palpitations    Pneumonia    hx of x 2    PONV (postoperative nausea and vomiting)    PVD (peripheral vascular disease) (HCC)    varicose veins - left worse than right   Spinal stenosis    Stress incontinence    Stroke [facility])    problem with expressive communicaiton   Traumatic hemopneumothorax 1982   left       PAST SURGICAL HISTORY: Past Surgical History:  Procedure Laterality Date   ANKLE HARDWARE REMOVAL     right   BREAST BIOPSY  1976   right   BREAST BIOPSY Left 2019   benign lymph node   BREAST BIOPSY Right 08/10/2022   MM RT BREAST BX W LOC  DEV 1ST LESION IMAGE BX SPEC STEREO GUIDE 08/10/2022 [facility]   BREAST LUMPECTOMY  2013   right with snbx   CHOLECYSTECTOMY  1990s   COLONOSCOPY     CYSTOSCOPY  07/05/2005   KNEE SURGERY  1980s   left   KYPHOPLASTY N/A 06/15/2017   Procedure: KYPHOPLASTY T11;  Surgeon: Venita Lick, MD;  Location: [facility] OR;  Service: Orthopedics;  Laterality: N/A;  90 mins   ORIF ANKLE FRACTURE  1980s   right    TOTAL KNEE ARTHROPLASTY Left 12/08/2015   Procedure: LEFT TOTAL KNEE ARTHROPLASTY;  Surgeon: Durene Romans, MD;  Location: [facility];  Service: Orthopedics;  Laterality: Left;   TRANSOBTURATOR SLING  07/05/2005   TUBAL LIGATION  1976   VEIN SURGERY     vein ablation left leg     FAMILY HISTORY:  Family History  Problem Relation Age of Onset   Stroke Mother    Heart  attack Father        Died age 3   Cancer Maternal Aunt        breast   Cancer Paternal Aunt        Multiple Myeloma   Breast cancer Paternal Aunt        late 50s-60s   Breast cancer Paternal Aunt        bilateral breast cancer   Dementia Paternal Aunt    Stomach cancer Paternal Uncle    Breast cancer Paternal Uncle    Congestive Heart Failure Paternal Uncle        Ischemic   Congestive Heart Failure Paternal Uncle        Ischemic   Heart attack Paternal Grandfather    Breast cancer Cousin        early 63s   Breast cancer Daughter      SOCIAL HISTORY:  reports that she quit smoking about 28 years ago. Her smoking use included cigarettes. She has a 7.50 pack-year smoking history. She has never used smokeless tobacco. She reports that she does not drink alcohol and does not use drugs. The patient is widowed and lives in [place]. 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    ALLERGIES: Augmentin [amoxicillin-pot clavulanate], Penicillins, Aspirin, Chlorhexidine gluconate, Chocolate, Coffee bean extract, Diclofenac, Ibuprofen, Oxycodone hcl, Shrimp [shellfish allergy], Sulfonamide derivatives, Lipitor [atorvastatin calcium], and Methocarbamol   MEDICATIONS:  Current Outpatient Medications  Medication Sig Dispense Refill   acetaminophen (TYLENOL) 650 MG CR tablet Take 650 mg by mouth every 8 (eight) hours as needed for pain.     albuterol (PROAIR HFA) 108 (90 Base) MCG/ACT inhaler Inhale 2 puffs into the lungs every 6 (six) hours as needed for wheezing or shortness of breath. 8 g 5   amLODipine (NORVASC) 10 MG tablet TAKE ONE TABLET EVERY DAY 90 tablet 1   ARNICA EX Apply 1 application topically daily as needed (pain).     b complex vitamins tablet Take 1 tablet by mouth 2 (two) times daily. PLUS FOLIC ACID PLUS VIT. C     Biotin 5000 MCG CAPS Take 5,000 mcg by mouth daily.      Capsaicin 0.025 % PADS Apply 1 application topically daily as needed (pain).      Cholecalciferol (VITAMIN D-3) 5000 UNITS TABS Take 5,000 Units by mouth daily.      clopidogrel (PLAVIX) 75 MG tablet TAKE ONE TABLET BY MOUTH DAILY 90 tablet 3   CO-ENZYME Q10-VITAMIN E PO Take 1 tablet by mouth daily.  Diclofenac Sodium (PENNSAID) 2 % SOLN Apply 1 application topically 3 (three) times daily. (Patient taking differently: Apply 1 application  topically as needed.) 112 g 0   diphenhydrAMINE (BENADRYL) 25 MG tablet Take 25 mg by mouth every 6 (six) hours as needed. Patient taking 1/2 (12.5 mg) as needed for allergies and itching     escitalopram (LEXAPRO) 10 MG tablet TAKE ONE TABLET BY MOUTH ONCE DAILY 90 tablet 1   Evolocumab (REPATHA SURECLICK) 140 MG/ML SOAJ Inject 140 mg into the skin every 14 (fourteen) days. 6 mL 3   fluticasone (FLONASE) 50 MCG/ACT nasal spray Place 1 spray into both nostrils daily. 16 g 3   fluticasone-salmeterol (ADVAIR HFA) 230-21 MCG/ACT inhaler Inhale 2 puffs into the lungs 2 (two) times daily. 12 g 0   hydrochlorothiazide (MICROZIDE) 12.5 MG capsule TAKE (1) CAPSULE TWICE DAILY. 180 capsule 3   ipratropium (ATROVENT) 0.03 % nasal spray Place 2 sprays into both nostrils every 12 (twelve) hours. 30 mL 0   isosorbide mononitrate (IMDUR) 30 MG 24 hr tablet Take 1 tablet (30 mg total) by mouth daily. Take at bedtime. 90 tablet 3   loratadine (CLARITIN) 10 MG tablet Take 10 mg by mouth daily as needed for allergies.     Misc Natural Products (OSTEO BI-FLEX ADV TRIPLE ST PO) Take 1 tablet by mouth daily.     montelukast (SINGULAIR) 10 MG tablet Take 1 tablet (10 mg total) by mouth at bedtime. 30 tablet 3   nitroGLYCERIN (NITROSTAT) 0.4 MG SL tablet Place 1 tablet (0.4 mg total) under the tongue every 5 (five) minutes as needed for chest pain. 25 tablet 3   ondansetron (ZOFRAN-ODT) 4 MG disintegrating tablet DISSOLVE 1 TABLET ON TONGUE EVERY 8 HOURS AS NEEDED FOR NAUSEA/VOMITING. 20 tablet 1   pantoprazole (PROTONIX) 40 MG tablet TAKE ONE TABLET BY MOUTH  DAILY 90 tablet 3   Polyethyl Glycol-Propyl Glycol (SYSTANE OP) Place 1 drop into both eyes daily as needed. Dry eyes     potassium chloride (KLOR-CON) 10 MEQ tablet Take 2 tablets (20 mEq total) by mouth 2 (two) times daily. 360 tablet 2   Probiotic Product (ALIGN PO) Take 1 tablet by mouth daily.     Selenium 200 MCG CAPS Take 200 mcg by mouth 2 (two) times daily.      simvastatin (ZOCOR) 10 MG tablet Take 1 tablet (10 mg total) by mouth once a week. 13 tablet 3   traMADol (ULTRAM) 50 MG tablet TAKE ONE TABLET BY MOUTH EVERY 8 HOURS AS NEEDED 15 tablet 1   Trolamine Salicylate (ASPERCREME EX) Apply 1 application topically daily as needed (pain).     UNABLE TO FIND Med Name: Neuriva Brain Supplement     Vibegron 75 MG TABS Take 1 tablet by mouth daily.     No current facility-administered medications for this visit.     REVIEW OF SYSTEMS: On review of systems, the patient reports that she is doing well overall but has been sore in her right breast since her original treatment in 2013. She reports she would like to avoid radiation due to her experience of dermatitis during her last course. She is also quite sore now due to extensive bruising of her right breast since her biopsy.     PHYSICAL EXAM:  Wt Readings from Last 3 Encounters:  08/15/22 139 lb (63 kg)  08/05/22 139 lb (63 kg)  06/03/22 137 lb (62.1 kg)   Temp Readings from Last 3 Encounters:  12/03/21  98.1 F (36.7 C) (Oral)  02/16/21 (!) 97.2 F (36.2 C) (Temporal)  07/09/20 98.1 F (36.7 C) (Temporal)   BP Readings from Last 3 Encounters:  08/15/22 124/70  08/05/22 138/72  06/03/22 138/72   Pulse Readings from Last 3 Encounters:  08/15/22 74  08/05/22 66  06/03/22 70    In general this is a well appearing caucasian female in no acute distress. She's alert and oriented x4 and appropriate throughout the examination. Cardiopulmonary assessment is negative for acute distress and she exhibits normal effort. Bilateral  breast exam is deferred.    ECOG = 1  0 - Asymptomatic (Fully active, able to carry on all predisease activities without restriction)  1 - Symptomatic but completely ambulatory (Restricted in physically strenuous activity but ambulatory and able to carry out work of a light or sedentary nature. For example, light housework, office work)  2 - Symptomatic, <50% in bed during the day (Ambulatory and capable of all self care but unable to carry out any work activities. Up and about more than 50% of waking hours)  3 - Symptomatic, >50% in bed, but not bedbound (Capable of only limited self-care, confined to bed or chair 50% or more of waking hours)  4 - Bedbound (Completely disabled. Cannot carry on any self-care. Totally confined to bed or chair)  5 - Death   Santiago Glad MM, Creech RH, Tormey DC, et al. [phone]). "Toxicity and response criteria of the [facility]". Am. Evlyn Clines. Oncol. 5 (6): 649-55    LABORATORY DATA:  Lab Results  Component Value Date   WBC 5.5 12/03/2021   HGB 12.1 12/03/2021   HCT 35.5 (L) 12/03/2021   MCV 87.6 12/03/2021   PLT 213.0 12/03/2021   Lab Results  Component Value Date   NA 143 11/02/2021   K 4.3 11/02/2021   CL 105 11/02/2021   CO2 24 11/02/2021   Lab Results  Component Value Date   ALT 11 11/02/2021   AST 15 11/02/2021   ALKPHOS 65 11/02/2021   BILITOT 0.7 11/02/2021      RADIOGRAPHY: MM RT BREAST BX W LOC DEV 1ST LESION IMAGE BX SPEC STEREO GUIDE  Addendum Date: 08/11/2022   ADDENDUM REPORT: 08/11/2022 16:20 ADDENDUM: PATHOLOGY revealed: Site breast, RIGHT, needle core biopsy, lower outer INVASIVE MAMMARY CARCINOMA, SEE NOTE MAMMARY CARCINOMA IN SITU,, SOLID, INTERMEDIATE NUCLEAR GRADE, WITHOUT NECROSIS TUBULE FORMATION: SCORE 3 NUCLEAR PLEOMORPHISM: SCORE 1 MITOTIC COUNT: SCORE 1 TOTAL SCORE: 5 OVERALL GRADE: 1 LYMPHOVASCULAR INVASION: NOT IDENTIFIED CANCER LENGTH: 0.4 CM CALCIFICATIONS: PRESENT OTHER FINDINGS:  FIBROADENOMATOID CHANGE WITH CALCIFICATIONS SEE NOTE Pathology results are CONCORDANT with imaging findings, per Dr. Annia Belt. Pathology results and recommendations below were discussed with patient by telephone on 08/11/2022. Patient reported biopsy site within normal limits with slight tenderness at the site. Post biopsy care instructions were reviewed, questions were answered and my direct phone number was provided to patient. Patient was instructed to call [facility] if any concerns or questions arise related to the biopsy. The patient was referred to the [facility] at [facility] with appointment on 08/17/2022. Pathology results reported by Lynett Grimes, RN on 08/11/2022. Electronically Signed   By: Annia Belt M.D.   On: 08/11/2022 16:20   Result Date: 08/11/2022 CLINICAL DATA:  Indeterminate calcifications right breast EXAM: RIGHT BREAST STEREOTACTIC CORE NEEDLE BIOPSY COMPARISON:  Previous exam(s). FINDINGS: The patient and I discussed the procedure of stereotactic-guided biopsy  including benefits and alternatives. We discussed the high likelihood of a successful procedure. We discussed the risks of the procedure including infection, bleeding, tissue injury, clip migration, and inadequate sampling. Informed written consent was given. The usual time out protocol was performed immediately prior to the procedure. Using sterile technique and 1% Lidocaine as local anesthetic, under stereotactic guidance, a 9 gauge vacuum assisted device was used to perform core needle biopsy of calcifications lower outer right breast using a lateral approach. Specimen radiograph was performed showing calcifications. Specimens with calcifications are identified for pathology. Lesion quadrant: Lower outer quadrant At the conclusion of the procedure, ribbon shaped tissue marker clip was deployed into the biopsy cavity. Follow-up 2-view mammogram was  performed and dictated separately. IMPRESSION: Stereotactic-guided biopsy of right breast calcifications. No apparent complications. Electronically Signed: By: Annia Belt M.D. On: 08/10/2022 12:28  MM CLIP PLACEMENT RIGHT  Result Date: 08/10/2022 CLINICAL DATA:  Status post stereo biopsy right breast calcifications EXAM: 3D DIAGNOSTIC RIGHT MAMMOGRAM POST STEREOTACTIC BIOPSY COMPARISON:  Previous exam(s). FINDINGS: 3D Mammographic images were obtained following stereotactic guided biopsy of right breast calcifications. The biopsy marking clip is in expected position at the site of biopsy. IMPRESSION: Appropriate positioning of the ribbon shaped biopsy marking clip at the site of biopsy in the lower outer right breast. Final Assessment: Post Procedure Mammograms for Marker Placement Electronically Signed   By: Annia Belt M.D.   On: 08/10/2022 12:31  MM Digital Diagnostic Unilat R  Result Date: 07/26/2022 CLINICAL DATA:  82 year old female presenting as a recall from screening for possible right breast calcifications. Patient has history of right breast cancer status post lumpectomy in 2013. EXAM: DIGITAL DIAGNOSTIC UNILATERAL RIGHT MAMMOGRAM TECHNIQUE: Right digital diagnostic mammography was performed. COMPARISON:  Previous exam(s). ACR Breast Density Category b: There are scattered areas of fibroglandular density. FINDINGS: Spot 2D magnification views and full true lateral 2D views of the right breast were performed demonstrating persistence of amorphous and round calcifications in the outer right breast spanning approximately 1.8 cm. There are a few linear forms. No associated mass or distortion. IMPRESSION: Indeterminate calcifications in the outer right breast spanning 1.8 cm. RECOMMENDATION: Stereotactic core needle biopsy x1 of the right breast calcifications. I have discussed the findings and recommendations with the patient. If applicable, a reminder letter will be sent to the patient regarding the  next appointment. BI-RADS CATEGORY  4: Suspicious. Electronically Signed   By: Emmaline Kluver M.D.   On: 07/26/2022 16:21  MM 3D SCREENING MAMMOGRAM BILATERAL BREAST  Result Date: 07/20/2022 CLINICAL DATA:  Screening. Status post right lumpectomy in 2013. EXAM: DIGITAL SCREENING BILATERAL MAMMOGRAM WITH TOMOSYNTHESIS AND CAD TECHNIQUE: Bilateral screening digital craniocaudal and mediolateral oblique mammograms were obtained. Bilateral screening digital breast tomosynthesis was performed. The images were evaluated with computer-aided detection. COMPARISON:  Previous exam(s). ACR Breast Density Category b: There are scattered areas of fibroglandular density. FINDINGS: In the right breast, calcifications warrant further evaluation with magnified views. In the left breast, no findings suspicious for malignancy. IMPRESSION: Further evaluation is suggested for calcifications in the right breast. RECOMMENDATION: Diagnostic mammogram of the right breast. (Code:[id number]) The patient will be contacted regarding the findings, and additional imaging will be scheduled. BI-RADS CATEGORY  0: Incomplete: Need additional imaging evaluation. Electronically Signed   By: Jacob Moores M.D.   On: 07/20/2022 13:18       IMPRESSION/PLAN: 1. 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 Dr. Mitzi Hansen discusses the pathology findings and reviews the nature of early stage right breast disease. The consensus from the breast conference includes mastectomy versus breast conservation with lumpectomy and she is motivated to proceed with lumpectomy and would prefer to avoid radiation. Dr. Mitzi Hansen discusses the situations when re-irradiation could be considered.We discussed the risks, benefits, short, and long term effects of repeating radiotherapy, as well as the delivery and logistics of  radiotherapy if indicated. We would be happy to see her back if needed but at this time she would not desire radiation even if recommended.  2. Possible genetic predisposition to malignancy. The patient had previously negative testing in 2013. Updating her testing was discussed today and this will be followed expectantly.  In a visit lasting 60 minutes, greater than 50% of the time was spent face to face reviewing her case, as well as in preparation of, discussing, and coordinating the patient's care.  The above documentation reflects my direct findings during this shared patient visit. Please see the separate note by Dr. Mitzi Hansen on this date for the remainder of the patient's plan of care.    Osker Mason, [facility]    **Disclaimer: This note was dictated with voice recognition software. Similar sounding words can inadvertently be transcribed and this note may contain transcription errors which may not have been corrected upon publication of note.**

## 2022-08-18 ENCOUNTER — Encounter: Payer: Self-pay | Admitting: Genetic Counselor

## 2022-08-18 DIAGNOSIS — Z803 Family history of malignant neoplasm of breast: Secondary | ICD-10-CM | POA: Insufficient documentation

## 2022-08-18 LAB — CBC

## 2022-08-18 LAB — COMPREHENSIVE METABOLIC PANEL
ALT: 10 IU/L (ref 0–32)
AST: 19 IU/L (ref 0–40)
Albumin/Globulin Ratio: 2 (ref 1.2–2.2)
Albumin: 4.4 g/dL (ref 3.8–4.8)
Alkaline Phosphatase: 62 IU/L (ref 44–121)
BUN/Creatinine Ratio: 21 (ref 12–28)
BUN: 21 mg/dL (ref 8–27)
Bilirubin Total: 0.8 mg/dL (ref 0.0–1.2)
CO2: 26 mmol/L (ref 20–29)
Calcium: 9.6 mg/dL (ref 8.7–10.3)
Chloride: 101 mmol/L (ref 96–106)
Creatinine, Ser: 0.98 mg/dL (ref 0.57–1.00)
Globulin, Total: 2.2 g/dL (ref 1.5–4.5)
Glucose: 87 mg/dL (ref 70–99)
Potassium: 3.9 mmol/L (ref 3.5–5.2)
Sodium: 140 mmol/L (ref 134–144)
Total Protein: 6.6 g/dL (ref 6.0–8.5)
eGFR: 59 mL/min/{1.73_m2} — ABNORMAL LOW (ref >59)

## 2022-08-18 LAB — LIPID PANEL
Chol/HDL Ratio: 1.9 ratio (ref 0.0–4.4)
Cholesterol, Total: 145 mg/dL (ref 100–199)
HDL: 76 mg/dL (ref >39)
LDL Chol Calc (NIH): 48 mg/dL (ref 0–99)
Triglycerides: 120 mg/dL (ref 0–149)
VLDL Cholesterol Cal: 21 mg/dL (ref 5–40)

## 2022-08-18 LAB — SPECIMEN STATUS REPORT

## 2022-08-18 NOTE — Progress Notes (Signed)
REFERRING PROVIDER: Serena Croissant, MD  PRIMARY PROVIDER:  Maxie Better, MD  PRIMARY REASON FOR VISIT:  1. Malignant neoplasm of upper-inner quadrant of right breast in female, estrogen receptor positive (HCC)   2. Malignant neoplasm of lower-outer quadrant of right breast of female, estrogen receptor positive (HCC)   3. Family history of breast cancer    HISTORY OF PRESENT ILLNESS:   Alexandria [name], a 82 y.o. female, was seen for a [place] cancer genetics consultation at the request of Dr. Pamelia Hoit due to a personal and family history of cancer.  Alexandria [name] presents to clinic today to discuss the possibility of a hereditary predisposition to cancer, to discuss genetic testing, and to further clarify her future cancer risks, as well as potential cancer risks for family members.   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 Alexandria [name] was diagnosed with invasive ductal carcinoma of the right breast in 2013 at age 34 (ER positive, PR negative, HER2 negative) and had Negative [facility] Breast/Gyn Panel at that time.    CANCER HISTORY:  Oncology History  Breast cancer of upper-inner quadrant of right female breast (HCC)  01/04/2012 Surgery   Right breast lumpectomy and a 1.6 cm IDC with negative margins, 1 SLN negative grade 1 with low-grade DCIS, ER 100%, PR 0%, HER-2 negative ratio 1.14, Ki-67 27% T1 C. 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%, HER2 -0    Malignant neoplasm of lower-outer quadrant of right breast of female,  estrogen receptor positive (HCC)  08/16/2022 Initial Diagnosis   Malignant neoplasm of lower-outer quadrant of right breast of female, estrogen receptor positive (HCC)   08/17/2022 Cancer Staging   Staging form: Breast, AJCC 8th Edition - Clinical stage from 08/17/2022: Stage IA (cT1c, cN0, cM0, G2, ER+, PR-, HER2-) - Signed by Ronny Bacon, PA-C on 08/17/2022 Stage prefix: Initial diagnosis Method of lymph node assessment: Clinical Histologic grading system: 3 grade system     Past Medical History:  Diagnosis Date   Allergy    Anemia    hx of    Anxiety    Arthritis    left knee, hands, back   Asthma    daily and prn inhalers   Atypical chest pain 11/26/2020   Brainstem infarct, 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 since age 42   IBS (irritable bowel syndrome)    Long term  current use of aromatase inhibitor 05/08/2012   Overactive bladder    Palpitations    Pneumonia    hx of x 2    PONV (postoperative nausea and vomiting)    PVD (peripheral vascular disease) (HCC)    varicose veins - left worse than right   Spinal stenosis    Stress incontinence    Stroke [facility])    problem with expressive communicaiton   Traumatic hemopneumothorax 1982   left    Past Surgical History:  Procedure Laterality Date   ANKLE HARDWARE REMOVAL     right   BREAST BIOPSY  1976   right   BREAST BIOPSY Left 2019   benign lymph node   BREAST BIOPSY Right 08/10/2022   MM RT BREAST BX W LOC DEV  1ST LESION IMAGE BX SPEC STEREO GUIDE 08/10/2022 [facility]   BREAST LUMPECTOMY  2013   right with snbx   CHOLECYSTECTOMY  1990s   COLONOSCOPY     CYSTOSCOPY  07/05/2005   KNEE SURGERY  1980s   left   KYPHOPLASTY N/A 06/15/2017   Procedure: KYPHOPLASTY T11;  Surgeon: Venita Lick, MD;  Location: [facility] OR;  Service: Orthopedics;  Laterality: N/A;  90 mins   ORIF ANKLE FRACTURE  1980s   right    TOTAL KNEE ARTHROPLASTY Left 12/08/2015   Procedure: LEFT TOTAL KNEE ARTHROPLASTY;  Surgeon: Durene Romans, MD;  Location: [facility];  Service: Orthopedics;  Laterality: Left;   TRANSOBTURATOR SLING  07/05/2005   TUBAL LIGATION  1976   VEIN SURGERY     vein ablation left leg    Social History   Socioeconomic History   Marital status: Widowed    Spouse name: Not on file   Number of children: Not on file   Years of education: Not on file   Highest education level: Not on file  Occupational History   Not on file  Tobacco Use   Smoking status: Former    Packs/day: 0.50    Years: 15.00    Additional pack years: 0.00    Total pack years: 7.50    Types: Cigarettes    Quit date: 08/28/1993    Years since quitting: 28.9   Smokeless tobacco: Never  Vaping Use   Vaping Use: Never used  Substance and Sexual Activity   Alcohol use: No    Alcohol/week: 0.0 standard drinks of alcohol   Drug use: No   Sexual activity: Not Currently  Other Topics Concern   Not on file  Social History Narrative   Widowed since 2014   Retired Set designer    Son and daughter   Social Determinants of Health   Financial Resource Strain: Low Risk  (11/26/2020)   Overall Financial Resource Strain (CARDIA)    Difficulty of Paying Living Expenses: Not hard at all  Food Insecurity: No Food Insecurity (11/26/2020)   Hunger Vital Sign    Worried About Running Out of Food in the Last Year: Never true    Ran Out of Food in the Last Year: Never true  Transportation Needs: No Transportation Needs (11/26/2020)   PRAPARE  - Administrator, Civil Service (Medical): No    Lack of Transportation (Non-Medical): No  Physical Activity: Insufficiently Active (11/26/2020)   Exercise Vital Sign    Days of Exercise per Week: 3 days    Minutes of Exercise per Session: 30 min  Stress: Not on file  Social Connections: Not on file     FAMILY  HISTORY:  We obtained a detailed, 4-generation family history.  Significant diagnoses are listed below: Family History  Problem Relation Age of Onset   Stroke Mother    Heart attack Father        Died age 4   Breast cancer Maternal Aunt 57   Multiple myeloma Paternal Aunt 102 - 65   Breast cancer Paternal Aunt 91 - 47   Breast cancer Paternal Aunt 85 - 79       bilateral breast cancer   Dementia Paternal Aunt    Stomach cancer Paternal Uncle 70 - 65   Breast cancer Paternal Uncle 26 - 69   Congestive Heart Failure Paternal Uncle        Ischemic   Congestive Heart Failure Paternal Uncle        Ischemic   Heart attack Paternal Grandfather    Breast cancer Daughter 78       reportedly BRCA+   Breast cancer Cousin        3 maternal first cousins        Alexandria [name]'s daughter recently died due to metastatic breast cancer at age 57 and reportedly had positive BRCA genetic testing. Alexandria [name]'s father was one of 10 children. Two paternal aunts were diagnosed with breast cancer (one had bilateral breast cancer), one paternal uncle was diagnosed breast cancer in his 10s, one paternal uncle was diagnosed with stomach cancer in his 7s, and one maternal aunt was diagnosed with mutiple myeloma in her 9s. Three paternal first cousins have a history of breast cancer. Alexandria [name]'s maternal aunt was diagnosed with breast cancer at age 31.   Patient's maternal ancestors are of [place] and [place] descent, and paternal ancestors are of [place], [place] and [place] descent. There is no reported Ashkenazi Jewish ancestry. There is no known consanguinity.  GENETIC COUNSELING  ASSESSMENT: Alexandria [name] is a 82 y.o. female with a personal and family history of cancer which is somewhat suggestive of a hereditary predisposition to cancer. We, therefore, discussed and recommended the following at today's visit.   DISCUSSION: We discussed that 5 - 10% of cancer is hereditary, with most cases of breast associated with BRCA1/2.  There are other genes that can be associated with hereditary breast cancer syndromes.  We discussed that testing is beneficial for several reasons including knowing how to follow individuals after completing their treatment, identifying whether potential treatment options would be beneficial, and understanding if other family members could be at risk for cancer and allowing them to undergo genetic testing.   We reviewed the characteristics, features and inheritance patterns of hereditary cancer syndromes. We also discussed genetic testing, including the appropriate family members to test, the process of testing, insurance coverage and turn-around-time for results. We discussed the implications of a negative, positive, carrier and/or variant of uncertain significant result.   We reviewed that Alexandria [name] had Negative Gene[facility]x testing in 2013 (35 genes total) and we typically would not recommend updated testing if all breast cancer genes were analyzed. However, Alexandria [name] is confident her daughter tested positive in a BRCA gene within the past year. Her daughter's father is unavailable for testing (he is deceased). Therefore, there is a chance her daughter inherited a mutation from her that was unable to be detected by [facility]x in 2013 and can now be detected with RNA analysis. In this case, updated genetic testing is reasonable.  The Custom Hereditary Cancers Panel offered by [facility] includes sequencing and/or deletion duplication testing of the  following 43 genes: APC, ATM, AXIN2, BAP1, BARD1, BMPR1A, BRCA1, BRCA2, BRIP1, CDH1, CDK4, CDKN2A (p14ARF and p16INK4a only),  CHEK2, CTNNA1, EPCAM (Deletion/duplication testing only), FH, GREM1 (promoter region duplication testing only), HOXB13, KIT, MBD4, MEN1, MLH1, MSH2, MSH3, MSH6, MUTYH, NF1, NHTL1, PALB2, PDGFRA, PMS2, POLD1, POLE, PTEN, RAD51C, RAD51D, SMAD4, SMARCA4. STK11, TP53, TSC1, TSC2, and VHL.  Based on Alexandria [name]'s personal and family history of cancer, she meets medical criteria for genetic testing. Despite that she meets criteria, she may still have an out of pocket cost. We discussed that if her out of pocket cost for testing is over $100, the laboratory will call and confirm whether she wants to proceed with testing.  If the out of pocket cost of testing is less than $100 she will be billed by the genetic testing laboratory.   PLAN: After considering the risks, benefits, and limitations, Alexandria [name] provided informed consent to pursue genetic testing and the blood sample was sent to [facility] for analysis of the Custom Panel. Results should be available within approximately 2-3 weeks' time, at which point they will be disclosed by telephone to Alexandria [name], as will any additional recommendations warranted by these results. Alexandria [name] will receive a summary of her genetic counseling visit and a copy of her results once available. This information will also be available in Epic.   Alexandria [name]'s questions were answered to her satisfaction today. Our contact information was provided should additional questions or concerns arise. Thank you for the referral and allowing [place] to share in the care of your patient.   Lalla Brothers, MS, [facility] Genetic Counselor [email or web address] (P) [phone]  The patient was seen for a total of 20 minutes in face-to-face genetic counseling. The patient was seen alone.  Drs. Pamelia Hoit and/or Mosetta Putt were available to discuss this case as needed.   _______________________________________________________________________ For Office Staff:  Number of people involved in  session: 1 Was an Intern/ student involved with case: no

## 2022-08-19 ENCOUNTER — Encounter (HOSPITAL_BASED_OUTPATIENT_CLINIC_OR_DEPARTMENT_OTHER): Payer: Medicare Other | Admitting: Physical Therapy

## 2022-08-20 ENCOUNTER — Other Ambulatory Visit (HOSPITAL_COMMUNITY): Payer: Self-pay

## 2022-08-22 ENCOUNTER — Telehealth: Payer: Self-pay | Admitting: *Deleted

## 2022-08-23 ENCOUNTER — Encounter: Payer: Self-pay | Admitting: General Practice

## 2022-08-23 ENCOUNTER — Inpatient Hospital Stay: Payer: Medicare Other | Attending: Hematology and Oncology | Admitting: Licensed Clinical Social Worker

## 2022-08-23 ENCOUNTER — Encounter (HOSPITAL_BASED_OUTPATIENT_CLINIC_OR_DEPARTMENT_OTHER): Payer: Medicare Other | Admitting: Physical Therapy

## 2022-08-23 NOTE — Progress Notes (Signed)
[facility] Clinical Social Work  Clinical Social Work was referred by spiritual care for assessment of psychosocial needs.  Clinical Social Worker contacted patient by phone to offer support and assess for needs related to food & utilities as indicated on SDOH screen.  Pt reports that her internet/ phone service had been out for for months and that she had issues getting a service person out to fix it, but that her son and she were finally able to reach the company and that it has been repaired and turned back on. Regarding food, only concern is her physical ability to get to the store at times due to other medical issues, but she has friends who can drive and help her if she is not feeling well.  No concerns with affording her basic needs.  CSW encouraged pt to call if any needs arise.     Madeline Bebout E Aycen Porreca, LCSW  Clinical Social Worker [facility]

## 2022-08-23 NOTE — Progress Notes (Signed)
[facility] Multidisciplinary Clinic Spiritual Care Note   Visited with Theia in [facility] She completed SDOH screening. Placed Social Work referral regarding food and utilities.  Chaplain and patient discussed common feelings and emotions when being diagnosed with cancer, and the importance of support during treatment.  Chaplain informed patient of the support team and support services at [facility].  Chaplain provided contact information and encouraged patient to call with any questions or concerns.  Follow up needed: No. Patina has direct chaplain number and knows to phone Spiritual Care as needed/desired.   [address] [place], [place], [place] [facility] Pager [phone] Voicemail [phone]

## 2022-08-23 NOTE — Telephone Encounter (Signed)
[employer] 2021-05 - Specimen Collection Study to Evaluate Biomarkers in Subjects with Cancer   Called patient to follow up on the above study.  Patient states she wants to participate and denies any questions. Patient requests call back on Wednesday this week to schedule an appointment as she is not sure about her other appointments this week. Plan to call patient back on Wednesday to schedule a research consent visit and lab appointment.  Domenica Reamer, BSN, RN, Charity fundraiser II [phone]

## 2022-08-24 ENCOUNTER — Telehealth: Payer: Self-pay | Admitting: *Deleted

## 2022-08-24 ENCOUNTER — Other Ambulatory Visit: Payer: Self-pay | Admitting: Surgery

## 2022-08-24 DIAGNOSIS — C50911 Malignant neoplasm of unspecified site of right female breast: Secondary | ICD-10-CM

## 2022-08-24 NOTE — Telephone Encounter (Addendum)
[employer] 2021-05 - Specimen Collection Study to Evaluate Biomarkers in Subjects with Cancer   LVM for patient requesting call back to schedule research visit and lab.  Domenica Reamer, BSN, RN, CCRP Clinical Research Nurse II [phone] 08/24/2022 1:55 PM  Patient returned call and requested research appointment and lab this Friday morning at 8 am.  Thanked patient for her call and instructed her to call back if she needs to change her appointment. She verbalized understanding.  Domenica Reamer, BSN, RN, [employer] Clinical Research Nurse II [phone] 08/24/2022 3:29 PM

## 2022-08-25 ENCOUNTER — Encounter: Payer: Self-pay | Admitting: *Deleted

## 2022-08-25 ENCOUNTER — Telehealth: Payer: Self-pay | Admitting: *Deleted

## 2022-08-25 ENCOUNTER — Ambulatory Visit (HOSPITAL_BASED_OUTPATIENT_CLINIC_OR_DEPARTMENT_OTHER)
Admission: RE | Admit: 2022-08-25 | Discharge: 2022-08-25 | Disposition: A | Discharge status: Home / Self Care | Payer: Medicare Other | Source: Ambulatory Visit | Attending: Hematology and Oncology | Admitting: Hematology and Oncology

## 2022-08-25 ENCOUNTER — Encounter (HOSPITAL_BASED_OUTPATIENT_CLINIC_OR_DEPARTMENT_OTHER): Payer: Medicare Other | Admitting: Physical Therapy

## 2022-08-25 ENCOUNTER — Other Ambulatory Visit: Payer: Self-pay | Admitting: Family Medicine

## 2022-08-25 DIAGNOSIS — Z780 Asymptomatic menopausal state: Secondary | ICD-10-CM | POA: Diagnosis present

## 2022-08-25 DIAGNOSIS — F419 Anxiety disorder, unspecified: Secondary | ICD-10-CM

## 2022-08-25 NOTE — Telephone Encounter (Signed)
Received call back from pt to discuss bone density results.  Pt states she is unable to take Calcium supplements due to history of calcium induced kidney stones in the past.  Pt states she consumes natural calcium daily, takes daily p.o vitamin D and walked 6 blocks a day.  Pt educated to continue with regimen at this time.

## 2022-08-25 NOTE — Telephone Encounter (Signed)
-----   Message from Loa Socks, NP sent at 08/25/2022  3:46 PM EDT ----- Bone density consistent with early osteopenia.  Recmmend calcium, vit d and weight bearing exercises.  Repeat DEXA in 2 years ----- Message ----- From: Interface, Rad Results In Sent: 08/25/2022   3:36 PM EDT To: Serena Croissant, MD

## 2022-08-25 NOTE — Telephone Encounter (Signed)
RN attempt x1 to contact pt with below information.  No answer, LVM for pt to return call to the office.

## 2022-08-25 NOTE — Telephone Encounter (Signed)
Left vm regarding [facility] from 6.5.24 . Contact information provided for question or needs.

## 2022-08-26 ENCOUNTER — Other Ambulatory Visit: Payer: Self-pay

## 2022-08-26 ENCOUNTER — Inpatient Hospital Stay: Payer: Medicare Other | Admitting: *Deleted

## 2022-08-26 ENCOUNTER — Encounter: Payer: Self-pay | Admitting: *Deleted

## 2022-08-26 ENCOUNTER — Telehealth: Payer: Self-pay | Admitting: Hematology and Oncology

## 2022-08-26 ENCOUNTER — Inpatient Hospital Stay: Payer: Medicare Other

## 2022-08-26 DIAGNOSIS — C50511 Malignant neoplasm of lower-outer quadrant of right female breast: Secondary | ICD-10-CM

## 2022-08-26 LAB — RESEARCH LABS

## 2022-08-26 NOTE — Telephone Encounter (Signed)
Called twice and left a message regarding patient's upcoming appointment time/dates.

## 2022-08-26 NOTE — Research (Signed)
Trial Name:  [employer] 2021-05 - Specimen Collection Study to Evaluate Biomarkers in Subjects with Cancer    Consent: Patient Alexandria [name] was identified by Dr. Pamelia Hoit as a potential candidate for the above listed study.  This Clinical Research Nurse met with [name], [id number] on 08/26/22 in a manner and location that ensures patient privacy to discuss participation in the above listed research study.  Patient is Unaccompanied.  Patient was previously provided with informed consent documents.  Patient confirmed they have read the informed consent documents.  As outlined in the informed consent form, this Nurse and [name] discussed the purpose of the research study, the investigational nature of the study, study procedures and requirements for study participation, potential risks and benefits of study participation, as well as alternatives to participation.  This study is not blinded or double-blinded. The patient understands participation is voluntary and they may withdraw from study participation at any time.  This study does not involve randomization.  This study does not involve an investigational drug or device. This study does not involve a placebo. Patient understands enrollment is pending full eligibility review.   Confidentiality and how the patient's information will be used as part of study participation were discussed.  Patient was informed there is reimbursement provided for their time and effort spent on trial participation.  The patient is encouraged to discuss research study participation with their insurance provider to determine what costs they may incur as part of study participation, including research related injury.    All questions were answered to patient's satisfaction.  The informed consent with embedded HIPAA language was reviewed page by page.  The patient's mental and emotional status is appropriate to provide informed consent, and the patient verbalizes  an understanding of study participation.  Patient has agreed to participate in the above listed research study and has voluntarily signed the informed consent IRB approved version 27 Mar 2020, revised 12 Apr 2020 with embedded HIPPA language on 08/26/22 at 8:50AM.  The patient was provided with a copy of the signed informed consent form with embedded HIPAA language for their reference.  No study specific procedures were obtained prior to the signing of the informed consent document.  Approximately 15 minutes were spent with the patient reviewing the informed consent documents.  Patient was not requested to complete a Release of Information form.   Data Collection- patient was interviewed to collect the following data: Medical History:  High Blood Pressure  Yes Coronary Artery Disease Yes Lupus    No Rheumatoid Arthritis  No Diabetes   No         Lynch Syndrome  No  Is the patient currently taking a magnesium supplement?   Yes If yes, dose and frequency? 20 mg twice daily Indication? Asthma/bronchitis Start date? Does not remember- More than 20 years, aprox. 2000  Does the patient have a personal history of cancer (greater than 5 years ago)?  Yes If yes, Cancer type and date of diagnosis?   Melanoma and Basal cell carcinoma  Has this previous diagnosis been treated? Yes      If so, treatment type? Surgical excision   Start and end dates of last treatment cycle? Does not remember exact date- both the melanoma on patients nose and basal cell on forehead were excised in early 2019  Does the patient have a family history of cancer in 1st or 2nd degree relatives? Yes If yes, Relationship(s) and Cancer type(s)? Breast Cancer- Aunts x  3, Daughter, and Kateri Mc.  Multiple Myeloma- Aunt.  Stomach Cancer- Uncle.  Does the patient have history of alcohol consumption? Yes   If yes, current or former? former If former, year stopped? 2000 Number of years? 38 Drinks per week? 1  Does the patient have  history of cigarette, cigar, pipe, or chewing tobacco use?  Yes  If yes, current for former? former If yes, type (Cigarette, cigar, pipe, and/or chewing tobacco)? cigarettes   If former, year stopped? 1994 Number of years? 31 Packs/number/containers per day?  1 pack per month, less than one cigarette per day on average   Eligibility: Eligibility criteria reviewed with patient. This Nurse has reviewed this patient's inclusion and exclusion criteria and confirmed patient is eligible for study participation. Eligibility confirmed by treating investigator, who also agrees that patient should proceed with enrollment. Patient will continue with enrollment.  Blood Collection: Research blood obtained by [facility] venipuncture. Patient tolerated well without any adverse events.  Gift Card: $50 gift card given to patient for her participation in this study.    Thanked patient for her participation in this study.  Domenica Reamer, BSN, RN, [employer] Research Nurse II [phone] 08/26/2022

## 2022-08-26 NOTE — Research (Signed)
[employer] 2021-05 - Specimen Collection Study to Evaluate Biomarkers in Subjects with Cancer   This Nurse has reviewed this patient's inclusion and exclusion criteria as a second review and confirms Alexandria [name] is eligible for study participation. Patient may continue with enrollment.  Juanita Laster, RN, BSN, CPN Clinical Research Nurse I [phone]  08/26/2022 9:52 AM

## 2022-08-29 ENCOUNTER — Other Ambulatory Visit (HOSPITAL_COMMUNITY): Payer: Self-pay

## 2022-08-29 ENCOUNTER — Telehealth: Payer: Self-pay | Admitting: Genetic Counselor

## 2022-08-29 NOTE — Telephone Encounter (Signed)
I attempted to contact Ms. Mcmorris to discuss her genetic testing results (43 genes). I left a voicemail requesting she call me back at [phone].  Lalla Brothers, MS, [facility] Genetic Counselor [email or web address] (P) [phone]

## 2022-09-06 ENCOUNTER — Other Ambulatory Visit (HOSPITAL_COMMUNITY): Payer: Self-pay

## 2022-09-16 ENCOUNTER — Telehealth: Payer: Self-pay | Admitting: Genetic Counselor

## 2022-09-16 NOTE — Telephone Encounter (Signed)
I contacted Ms. Menard to discuss her genetic testing results. No pathogenic variants were identified in the 43 genes analyzed. Detailed clinic note to follow.  The test report has been scanned into EPIC and is located under the Molecular Pathology section of the Results Review tab.  A portion of the result report is included below for reference.   Lalla Brothers, MS, [facility] Genetic Counselor [email or web address] (P) [phone]

## 2022-09-19 ENCOUNTER — Encounter: Payer: Self-pay | Admitting: Genetic Counselor

## 2022-09-19 ENCOUNTER — Ambulatory Visit: Payer: Self-pay | Admitting: Genetic Counselor

## 2022-09-19 DIAGNOSIS — Z1379 Encounter for other screening for genetic and chromosomal anomalies: Secondary | ICD-10-CM

## 2022-09-19 NOTE — Progress Notes (Signed)
HPI:   Alexandria [name]. Bohan was previously seen in the [facility] due to a personal history of cancer and concerns regarding a hereditary predisposition to cancer. Please refer to our prior cancer genetics clinic note for more information regarding our discussion, assessment and recommendations, at the time. Alexandria [name]. Falzon's recent genetic test results were disclosed to her, as were recommendations warranted by these results. These results and recommendations are discussed in more detail below.  CANCER HISTORY:  Oncology History  Breast cancer of upper-inner quadrant of right female breast (HCC)  01/04/2012 Surgery   Right breast lumpectomy and a 1.6 cm IDC with negative margins, 1 SLN negative grade 1 with low-grade DCIS, ER 100%, PR 0%, HER-2 negative ratio 1.14, Ki-67 27% T1 C. 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%, HER2 -0    Malignant neoplasm of lower-outer quadrant of right breast of female, estrogen receptor positive (HCC)  08/16/2022 Initial Diagnosis   Malignant neoplasm of lower-outer quadrant of right breast of female, estrogen receptor positive (HCC)   08/17/2022 Cancer Staging   Staging form: Breast, AJCC 8th Edition - Clinical stage from 08/17/2022: Stage IA (cT1c, cN0, cM0, G2, ER+, PR-, HER2-) - Signed by Ronny Bacon, PA-C on 08/17/2022 Stage prefix: Initial diagnosis Method of lymph node assessment: Clinical Histologic grading system: 3 grade system    Genetic Testing   [facility] Custom Panel+RNA was Negative. Report date is 08/23/2022.  The Custom Hereditary Cancers Panel offered by [facility] includes  sequencing and/or deletion duplication testing of the following 43 genes: APC, ATM, AXIN2, BAP1, BARD1, BMPR1A, BRCA1, BRCA2, BRIP1, CDH1, CDK4, CDKN2A (p14ARF and p16INK4a only), CHEK2, CTNNA1, EPCAM (Deletion/duplication testing only), FH, GREM1 (promoter region duplication testing only), HOXB13, KIT, MBD4, MEN1, MLH1, MSH2, MSH3, MSH6, MUTYH, NF1, NHTL1, PALB2, PDGFRA, PMS2, POLD1, POLE, PTEN, RAD51C, RAD51D, SMAD4, SMARCA4. STK11, TP53, TSC1, TSC2, and VHL.     FAMILY HISTORY:  We obtained a detailed, 4-generation family history.  Significant diagnoses are listed below: Family History  Problem Relation Age of Onset   Stroke Mother    Heart attack Father        Died age 39   Breast cancer Maternal Aunt 30   Multiple myeloma Paternal Aunt 68 - 76   Breast cancer Paternal Aunt 61 - 65   Breast cancer Paternal Aunt 48 - 79       bilateral breast cancer   Dementia Paternal Aunt    Stomach cancer Paternal Uncle 57 - 56   Breast cancer Paternal Uncle 55 - 69   Congestive Heart Failure Paternal Uncle        Ischemic   Congestive Heart Failure Paternal Uncle        Ischemic   Heart attack Paternal Grandfather    Breast cancer Daughter 70       reportedly BRCA+   Breast cancer Cousin        3 maternal first cousins                Alexandria [name]. Alldredge's daughter recently died due to metastatic breast cancer at age 31 and reportedly had positive  BRCA genetic testing. Alexandria [name]. Croswell's father was one of 10 children. Two paternal aunts were diagnosed with breast cancer (one had bilateral breast cancer), one paternal uncle was diagnosed breast cancer in his 44s, one paternal uncle was diagnosed with stomach cancer in his 74s, and one maternal aunt was diagnosed with mutiple myeloma in her 84s. Three paternal first cousins have a history of breast cancer. Alexandria [name]. Radford's maternal aunt was diagnosed with breast cancer at age 30.    Patient's maternal ancestors are of [place] and [place] descent, and  paternal ancestors are of [place], [place] and [place] descent. There is no reported Ashkenazi Jewish ancestry. There is no known consanguinity.  GENETIC TEST RESULTS:  The [facility] Custom Panel found no pathogenic mutations.   The Custom Hereditary Cancers Panel offered by [facility] includes sequencing and/or deletion duplication testing of the following 43 genes: APC, ATM, AXIN2, BAP1, BARD1, BMPR1A, BRCA1, BRCA2, BRIP1, CDH1, CDK4, CDKN2A (p14ARF and p16INK4a only), CHEK2, CTNNA1, EPCAM (Deletion/duplication testing only), FH, GREM1 (promoter region duplication testing only), HOXB13, KIT, MBD4, MEN1, MLH1, MSH2, MSH3, MSH6, MUTYH, NF1, NHTL1, PALB2, PDGFRA, PMS2, POLD1, POLE, PTEN, RAD51C, RAD51D, SMAD4, SMARCA4. STK11, TP53, TSC1, TSC2, and VHL.  The test report has been scanned into EPIC and is located under the Molecular Pathology section of the Results Review tab.  A portion of the result report is included below for reference. Genetic testing reported out on 08/23/2022.     Even though a pathogenic variant was not identified, possible explanations for the cancer in the family may include: There may be no hereditary risk for cancer in the family. The cancers in Alexandria [name]. Cressman and/or her family may be due to other genetic or environmental factors. There may be a gene mutation in one of these genes that current testing methods cannot detect, but that chance is small. There could be another gene that has not yet been discovered, or that we have not yet tested, that is responsible for the cancer diagnoses in the family.  It is also possible there is a hereditary cause for the cancer in the family that Alexandria [name]. Kernodle did not inherit.  Therefore, it is important to remain in touch with cancer genetics in the future so that we can continue to offer Alexandria [name]. [place] the most up to date genetic testing.   ADDITIONAL GENETIC TESTING:  We discussed with Alexandria [name]. Zazueta that her genetic testing was fairly extensive.  If there  are genes identified to increase cancer risk that can be analyzed in the future, we would be happy to discuss and coordinate this testing at that time.    CANCER SCREENING RECOMMENDATIONS:  Alexandria [name]. Lyons's test result is considered negative (normal).  This means that we have not identified a hereditary cause for her personal and family history of cancer at this time.  An individual's cancer risk and medical management are not determined by genetic test results alone. Overall cancer risk assessment incorporates additional factors, including personal medical history, family history, and any available genetic information that may result in a personalized plan for cancer prevention and surveillance. Therefore, it is recommended she continue to follow the cancer management and screening guidelines provided by her oncology and primary healthcare provider.  RECOMMENDATIONS FOR FAMILY MEMBERS:   Since she did not inherit a mutation in a cancer predisposition gene included on this panel, her children could not have inherited a mutation from her in one of these genes. However, if her daughter had a BRCA gene mutation, her  son should still get genetic testing, because he could have inherited a mutation from his father.  Individuals in this family might be at some increased risk of developing cancer, over the general population risk, due to the family history of cancer. We recommend women in this family have a yearly mammogram beginning at age 62, or 56 years younger than the earliest onset of cancer, an annual clinical breast exam, and perform monthly breast self-exams.  Other members of the family may still carry a pathogenic variant in one of these genes that Alexandria [name]. Pedraza did not inherit. Based on the family history, we recommend individuals in the family who have a history of cancer consider genetic counseling and testing.   FOLLOW-UP:  Cancer genetics is a rapidly advancing field and it is possible that new genetic  tests will be appropriate for her and/or her family members in the future. We encouraged her to remain in contact with cancer genetics on an annual basis so we can update her personal and family histories and let her know of advances in cancer genetics that may benefit this family.   Our contact number was provided. Alexandria [name]. Auvil's questions were answered to her satisfaction, and she knows she is welcome to call us at anytime with additional questions or concerns.   Alexandria Brothers, Alexandria [name], [facility] Genetic Counselor [name] (P) [phone]

## 2022-09-20 ENCOUNTER — Other Ambulatory Visit: Payer: Self-pay

## 2022-09-20 ENCOUNTER — Encounter (HOSPITAL_BASED_OUTPATIENT_CLINIC_OR_DEPARTMENT_OTHER): Payer: Self-pay | Admitting: Surgery

## 2022-09-21 ENCOUNTER — Encounter (HOSPITAL_BASED_OUTPATIENT_CLINIC_OR_DEPARTMENT_OTHER)
Admission: RE | Admit: 2022-09-21 | Discharge: 2022-09-21 | Disposition: A | Discharge status: Home / Self Care | Payer: Medicare Other | Source: Ambulatory Visit | Attending: Surgery | Admitting: Surgery

## 2022-09-21 DIAGNOSIS — Z01812 Encounter for preprocedural laboratory examination: Secondary | ICD-10-CM | POA: Diagnosis not present

## 2022-09-21 LAB — BASIC METABOLIC PANEL
Anion gap: 8 (ref 5–15)
BUN: 17 mg/dL (ref 8–23)
CO2: 27 mmol/L (ref 22–32)
Calcium: 9.5 mg/dL (ref 8.9–10.3)
Chloride: 106 mmol/L (ref 98–111)
Creatinine, Ser: 0.93 mg/dL (ref 0.44–1.00)
GFR, Estimated: >60 mL/min (ref >60)
Glucose, Bld: 104 mg/dL — ABNORMAL HIGH (ref 70–99)
Potassium: 4.5 mmol/L (ref 3.5–5.1)
Sodium: 141 mmol/L (ref 135–145)

## 2022-09-21 NOTE — Progress Notes (Signed)
       Patient Instructions  The night before surgery:  No food after midnight. ONLY clear liquids after midnight  The day of surgery (if you do NOT have diabetes):  Drink ONE (1) Pre-Surgery Clear Ensure as directed.   This drink was given to you during your hospital  pre-op appointment visit. The pre-op nurse will instruct you on the time to drink the  Pre-Surgery Ensure depending on your surgery time. Finish the drink at the designated time by the pre-op nurse.  Nothing else to drink after completing the  Pre-Surgery Clear Ensure.         If you have questions, please contact your surgeon's office. 

## 2022-09-27 ENCOUNTER — Ambulatory Visit
Admission: RE | Admit: 2022-09-27 | Discharge: 2022-09-27 | Disposition: A | Discharge status: Home / Self Care | Payer: Medicare Other | Source: Ambulatory Visit | Attending: Surgery | Admitting: Surgery

## 2022-09-27 DIAGNOSIS — C50911 Malignant neoplasm of unspecified site of right female breast: Secondary | ICD-10-CM

## 2022-09-27 HISTORY — PX: BREAST BIOPSY: SHX20

## 2022-09-28 ENCOUNTER — Encounter (HOSPITAL_BASED_OUTPATIENT_CLINIC_OR_DEPARTMENT_OTHER)
Admission: RE | Disposition: A | Discharge status: Home / Self Care | Payer: Self-pay | Source: Home / Self Care | Attending: Surgery

## 2022-09-28 ENCOUNTER — Ambulatory Visit (HOSPITAL_BASED_OUTPATIENT_CLINIC_OR_DEPARTMENT_OTHER)
Admission: RE | Admit: 2022-09-28 | Discharge: 2022-09-28 | Disposition: A | Discharge status: Home / Self Care | Payer: Medicare Other | Attending: Surgery | Admitting: Surgery

## 2022-09-28 ENCOUNTER — Other Ambulatory Visit: Payer: Self-pay

## 2022-09-28 ENCOUNTER — Ambulatory Visit (HOSPITAL_BASED_OUTPATIENT_CLINIC_OR_DEPARTMENT_OTHER): Payer: Medicare Other | Admitting: Anesthesiology

## 2022-09-28 ENCOUNTER — Encounter (HOSPITAL_BASED_OUTPATIENT_CLINIC_OR_DEPARTMENT_OTHER): Payer: Self-pay | Admitting: Surgery

## 2022-09-28 ENCOUNTER — Ambulatory Visit
Admission: RE | Admit: 2022-09-28 | Discharge: 2022-09-28 | Disposition: A | Discharge status: Home / Self Care | Payer: Medicare Other | Source: Ambulatory Visit | Attending: Surgery | Admitting: Surgery

## 2022-09-28 DIAGNOSIS — Z8673 Personal history of transient ischemic attack (TIA), and cerebral infarction without residual deficits: Secondary | ICD-10-CM | POA: Insufficient documentation

## 2022-09-28 DIAGNOSIS — Z170 Estrogen receptor positive status [ER+]: Secondary | ICD-10-CM | POA: Diagnosis not present

## 2022-09-28 DIAGNOSIS — C50911 Malignant neoplasm of unspecified site of right female breast: Secondary | ICD-10-CM

## 2022-09-28 DIAGNOSIS — D759 Disease of blood and blood-forming organs, unspecified: Secondary | ICD-10-CM | POA: Diagnosis not present

## 2022-09-28 DIAGNOSIS — Z7902 Long term (current) use of antithrombotics/antiplatelets: Secondary | ICD-10-CM | POA: Diagnosis not present

## 2022-09-28 DIAGNOSIS — C50511 Malignant neoplasm of lower-outer quadrant of right female breast: Secondary | ICD-10-CM | POA: Diagnosis not present

## 2022-09-28 DIAGNOSIS — Z923 Personal history of irradiation: Secondary | ICD-10-CM | POA: Diagnosis not present

## 2022-09-28 DIAGNOSIS — I2510 Atherosclerotic heart disease of native coronary artery without angina pectoris: Secondary | ICD-10-CM | POA: Diagnosis not present

## 2022-09-28 DIAGNOSIS — C50811 Malignant neoplasm of overlapping sites of right female breast: Secondary | ICD-10-CM | POA: Diagnosis not present

## 2022-09-28 DIAGNOSIS — C50211 Malignant neoplasm of upper-inner quadrant of right female breast: Secondary | ICD-10-CM | POA: Diagnosis not present

## 2022-09-28 DIAGNOSIS — I10 Essential (primary) hypertension: Secondary | ICD-10-CM | POA: Diagnosis not present

## 2022-09-28 DIAGNOSIS — Z01818 Encounter for other preprocedural examination: Secondary | ICD-10-CM

## 2022-09-28 DIAGNOSIS — J4489 Other specified chronic obstructive pulmonary disease: Secondary | ICD-10-CM | POA: Insufficient documentation

## 2022-09-28 DIAGNOSIS — I739 Peripheral vascular disease, unspecified: Secondary | ICD-10-CM | POA: Diagnosis not present

## 2022-09-28 DIAGNOSIS — Z87891 Personal history of nicotine dependence: Secondary | ICD-10-CM | POA: Diagnosis not present

## 2022-09-28 HISTORY — DX: Cerebral aneurysm, nonruptured: I67.1

## 2022-09-28 HISTORY — DX: Atherosclerotic heart disease of native coronary artery without angina pectoris: I25.10

## 2022-09-28 HISTORY — PX: BREAST LUMPECTOMY WITH RADIOACTIVE SEED LOCALIZATION: SHX6424

## 2022-09-28 SURGERY — BREAST LUMPECTOMY WITH RADIOACTIVE SEED LOCALIZATION
Anesthesia: General | Site: Breast | Laterality: Right

## 2022-09-28 MED ORDER — ACETAMINOPHEN 10 MG/ML IV SOLN: 1000.0000 mg | Freq: Once | INTRAVENOUS | Status: DC | PRN

## 2022-09-28 MED ORDER — CLINDAMYCIN PHOSPHATE 900 MG/50ML IV SOLN
INTRAVENOUS | Status: AC
  Filled 2022-09-28: qty 50

## 2022-09-28 MED ORDER — FENTANYL CITRATE (PF) 100 MCG/2ML IJ SOLN
INTRAMUSCULAR | Status: DC | PRN
  Administered 2022-09-28 (×2): 25 ug via INTRAVENOUS
  Administered 2022-09-28: 50 ug via INTRAVENOUS

## 2022-09-28 MED ORDER — LACTATED RINGERS IV SOLN: INTRAVENOUS | Status: DC

## 2022-09-28 MED ORDER — LIDOCAINE 2% (20 MG/ML) 5 ML SYRINGE
INTRAMUSCULAR | Status: AC
  Filled 2022-09-28: qty 5

## 2022-09-28 MED ORDER — ONDANSETRON HCL 4 MG/2ML IJ SOLN: 4.0000 mg | Freq: Once | INTRAMUSCULAR | Status: DC | PRN

## 2022-09-28 MED ORDER — DEXAMETHASONE SODIUM PHOSPHATE 10 MG/ML IJ SOLN
INTRAMUSCULAR | Status: AC
  Filled 2022-09-28: qty 1

## 2022-09-28 MED ORDER — ONDANSETRON HCL 4 MG/2ML IJ SOLN
INTRAMUSCULAR | Status: AC
  Filled 2022-09-28: qty 2

## 2022-09-28 MED ORDER — TRAMADOL HCL 50 MG PO TABS: 50.0000 mg | ORAL_TABLET | Freq: Four times a day (QID) | ORAL | 0 refills | Status: DC | PRN

## 2022-09-28 MED ORDER — LIDOCAINE HCL (CARDIAC) PF 100 MG/5ML IV SOSY
PREFILLED_SYRINGE | INTRAVENOUS | Status: DC | PRN
  Administered 2022-09-28: 60 mg via INTRAVENOUS

## 2022-09-28 MED ORDER — PROPOFOL 10 MG/ML IV BOLUS
INTRAVENOUS | Status: DC | PRN
Start: 2022-09-28 — End: 2022-09-28
  Administered 2022-09-28: 200 mg via INTRAVENOUS

## 2022-09-28 MED ORDER — AMISULPRIDE (ANTIEMETIC) 5 MG/2ML IV SOLN
INTRAVENOUS | Status: AC
  Filled 2022-09-28: qty 4

## 2022-09-28 MED ORDER — BUPIVACAINE-EPINEPHRINE (PF) 0.25% -1:200000 IJ SOLN
INTRAMUSCULAR | Status: DC | PRN
  Administered 2022-09-28: 20 mL via PERINEURAL

## 2022-09-28 MED ORDER — AMISULPRIDE (ANTIEMETIC) 5 MG/2ML IV SOLN
10.0000 mg | Freq: Once | INTRAVENOUS | Status: AC | PRN
  Administered 2022-09-28: 10 mg via INTRAVENOUS

## 2022-09-28 MED ORDER — FENTANYL CITRATE (PF) 100 MCG/2ML IJ SOLN
INTRAMUSCULAR | Status: AC
  Filled 2022-09-28: qty 2

## 2022-09-28 MED ORDER — DEXAMETHASONE SODIUM PHOSPHATE 4 MG/ML IJ SOLN
INTRAMUSCULAR | Status: DC | PRN
  Administered 2022-09-28: 4 mg via INTRAVENOUS

## 2022-09-28 MED ORDER — FENTANYL CITRATE (PF) 100 MCG/2ML IJ SOLN
25.0000 ug | INTRAMUSCULAR | Status: DC | PRN
  Administered 2022-09-28 (×2): 50 ug via INTRAVENOUS

## 2022-09-28 MED ORDER — CLINDAMYCIN PHOSPHATE 900 MG/50ML IV SOLN
900.0000 mg | INTRAVENOUS | Status: AC
  Administered 2022-09-28: 900 mg via INTRAVENOUS

## 2022-09-28 MED ORDER — ONDANSETRON HCL 4 MG/2ML IJ SOLN
INTRAMUSCULAR | Status: DC | PRN
  Administered 2022-09-28: 4 mg via INTRAVENOUS

## 2022-09-28 SURGICAL SUPPLY — 51 items
ADH SKN CLS APL DERMABOND .7 (GAUZE/BANDAGES/DRESSINGS) ×1
APL PRP STRL LF DISP 70% ISPRP (MISCELLANEOUS) ×1
APPLIER CLIP 9.375 MED OPEN (MISCELLANEOUS) ×1
APR CLP MED 9.3 20 MLT OPN (MISCELLANEOUS) ×1
BAG DECANTER FOR FLEXI CONT (MISCELLANEOUS) IMPLANT
BINDER BREAST LRG (GAUZE/BANDAGES/DRESSINGS) IMPLANT
BINDER BREAST MEDIUM (GAUZE/BANDAGES/DRESSINGS) IMPLANT
BINDER BREAST XLRG (GAUZE/BANDAGES/DRESSINGS) IMPLANT
BINDER BREAST XXLRG (GAUZE/BANDAGES/DRESSINGS) IMPLANT
BLADE SURG 15 STRL LF DISP TIS (BLADE) ×1 IMPLANT
BLADE SURG 15 STRL SS (BLADE) ×1
CANISTER SUC SOCK COL 7IN (MISCELLANEOUS) IMPLANT
CANISTER SUCT 1200ML W/VALVE (MISCELLANEOUS) IMPLANT
CHLORAPREP W/TINT 26 (MISCELLANEOUS) ×1 IMPLANT
CLEANSER WND VASHE INSTL 34OZ (WOUND CARE) IMPLANT
CLIP APPLIE 9.375 MED OPEN (MISCELLANEOUS) IMPLANT
COVER BACK TABLE 60X90IN (DRAPES) ×1 IMPLANT
COVER MAYO STAND STRL (DRAPES) ×1 IMPLANT
COVER PROBE CYLINDRICAL 5X96 (MISCELLANEOUS) ×1 IMPLANT
DERMABOND ADVANCED .7 DNX12 (GAUZE/BANDAGES/DRESSINGS) ×1 IMPLANT
DRAPE LAPAROSCOPIC ABDOMINAL (DRAPES) IMPLANT
DRAPE LAPAROTOMY 100X72 PEDS (DRAPES) ×1 IMPLANT
DRAPE UTILITY XL STRL (DRAPES) ×1 IMPLANT
ELECT COATED BLADE 2.86 ST (ELECTRODE) ×1 IMPLANT
ELECT REM PT RETURN 9FT ADLT (ELECTROSURGICAL) ×1
ELECTRODE REM PT RTRN 9FT ADLT (ELECTROSURGICAL) ×1 IMPLANT
GLOVE BIOGEL PI IND STRL 8 (GLOVE) ×1 IMPLANT
GLOVE ECLIPSE 8.0 STRL XLNG CF (GLOVE) ×1 IMPLANT
GOWN STRL REUS W/ TWL LRG LVL3 (GOWN DISPOSABLE) ×2 IMPLANT
GOWN STRL REUS W/ TWL XL LVL3 (GOWN DISPOSABLE) ×1 IMPLANT
GOWN STRL REUS W/TWL LRG LVL3 (GOWN DISPOSABLE) ×3
GOWN STRL REUS W/TWL XL LVL3 (GOWN DISPOSABLE) ×1
HEMOSTAT ARISTA ABSORB 3G PWDR (HEMOSTASIS) IMPLANT
HEMOSTAT SNOW SURGICEL 2X4 (HEMOSTASIS) IMPLANT
KIT MARKER MARGIN INK (KITS) ×1 IMPLANT
NDL HYPO 25X1 1.5 SAFETY (NEEDLE) ×1 IMPLANT
NEEDLE HYPO 25X1 1.5 SAFETY (NEEDLE) ×1 IMPLANT
NS IRRIG 1000ML POUR BTL (IV SOLUTION) ×1 IMPLANT
PACK BASIN DAY SURGERY FS (CUSTOM PROCEDURE TRAY) ×1 IMPLANT
PENCIL SMOKE EVACUATOR (MISCELLANEOUS) ×1 IMPLANT
SLEEVE SCD COMPRESS KNEE MED (STOCKING) ×1 IMPLANT
SPIKE FLUID TRANSFER (MISCELLANEOUS) IMPLANT
SPONGE T-LAP 4X18 ~~LOC~~+RFID (SPONGE) ×1 IMPLANT
SUT MNCRL AB 4-0 PS2 18 (SUTURE) ×1 IMPLANT
SUT SILK 2 0 SH (SUTURE) IMPLANT
SUT VICRYL 3-0 CR8 SH (SUTURE) ×1 IMPLANT
SYR CONTROL 10ML LL (SYRINGE) ×1 IMPLANT
TOWEL GREEN STERILE FF (TOWEL DISPOSABLE) ×1 IMPLANT
TRAY FAXITRON CT DISP (TRAY / TRAY PROCEDURE) ×1 IMPLANT
TUBE CONNECTING 20X1/4 (TUBING) IMPLANT
YANKAUER SUCT BULB TIP NO VENT (SUCTIONS) IMPLANT

## 2022-09-28 NOTE — Interval H&P Note (Signed)
History and Physical Interval Note:  09/28/2022 1:20 PM  Alexandria [name]  has presented today for surgery, with the diagnosis of RIGHT BREAST CANCER.  The various methods of treatment have been discussed with the patient and family. After consideration of risks, benefits and other options for treatment, the patient has consented to  Procedure(s): RIGHT BREAST LUMPECTOMY WITH RADIOACTIVE SEED LOCALIZATION (Right) as a surgical intervention.  The patient's history has been reviewed, patient examined, no change in status, stable for surgery.  I have reviewed the patient's chart and labs.  Questions were answered to the patient's satisfaction.   The procedure has been discussed with the patient. Alternatives to surgery have been discussed with the patient.  Risks of surgery include bleeding,  Infection,  Seroma formation, death,  and the need for further surgery.   The patient understands and wishes to proceed.   Dortha Schwalbe MD

## 2022-09-28 NOTE — Anesthesia Procedure Notes (Signed)
Procedure Name: LMA Insertion Date/Time: 09/28/2022 2:22 PM  Performed by: Burna Cash, CRNAPre-anesthesia Checklist: Patient identified, Emergency Drugs available, Suction available and Patient being monitored Patient Re-evaluated:Patient Re-evaluated prior to induction Oxygen Delivery Method: Circle system utilized Preoxygenation: Pre-oxygenation with 100% oxygen Induction Type: IV induction Ventilation: Mask ventilation without difficulty LMA: LMA inserted LMA Size: 4.0 Number of attempts: 1 Airway Equipment and Method: Bite block Placement Confirmation: positive ETCO2 Tube secured with: Tape Dental Injury: Teeth and Oropharynx as per pre-operative assessment

## 2022-09-28 NOTE — Op Note (Signed)
Preoperative diagnosis stage I right breast cancer upper outer quadrant  Postoperative diagnosis: Same  Procedure: Right breast seed localized lumpectomy  Surgeon: Harriette Bouillon MD  Assistant: Dr. Pandora Leiter  MD   I was personally present / performed  the key and critical portions of this procedure and immediately available throughout the entire procedure, as documented in my operative note.    Anesthesia: LMA with 0.5% Marcaine plain  Drains: None  Specimens: Right breast tissue with seed.  Clip was identified and hematoma.  Additional medial margin was taken.  All tissue was oriented with ink  Indications for procedure: Patient is a 82 year old female with right breast cancer.  She has a past history of right breast cancer in 2014.  She is opted for a right breast lumpectomy .The procedure has been discussed with the patient. Alternatives to surgery have been discussed with the patient.  Risks of surgery include bleeding,  Infection,  Seroma formation, death,  and the need for further surgery.   The patient understands and wishes to proceed.      Description of procedure: The patient was met in the holding area and questions were answered.  She underwent seed placement as an outpatient radiology.  The right side was marked as the correct site.  All questions were answered.  She was taken back to the operating room and placed supine upon the OR table.  After induction of general anesthesia, the right breast was prepped and draped in sterile fashion.  The neoprobe was used to identify the seed in the right breast.  Transverse incision was made over the signal.  Dissection was carried down all tissue around the seed and clip were excised with a grossly negative margin.  Of note there is a large hematoma evacuated with this.  The imaging revealed the seed to be in the deep tissue.  The clip was found with tunnel anteriorly and was imaged.  The tissue was oriented with ink.  Additional medial margin  was excised as well.  Cavity was irrigated.  Hemostasis achieved with cautery.  The specimen was sent to pathology.  Clips were used to mark the cavity.  The deep tissue planes were approximated with 3-0 Vicryl.  4 Monocryl was used to close the skin.  Dermabond and breast binder placed.  All counts correct.  The patient was awoke extubated taken to recovery in satisfactory condition.

## 2022-09-28 NOTE — H&P (Signed)
History of Present Illness: Alexandria [name] is a 82 y.o. female who is seen today as an office consultation for evaluation of Breast Cancer  Patient presents to the [facility] for evaluation of newly diagnosed right breast cancer. She had history of right breast lumpectomy and radiation therapy 2014 due to right breast cancer. 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 She has significant bruising from taking Plavix from the biopsy.  Review of Systems: A complete review of systems was obtained from the patient. I have reviewed this information and discussed as appropriate with the patient. See HPI as well for other ROS.    Medical History: Past Medical History:  Diagnosis Date  Anemia  Aneurysm (CMS-HCC)  Anxiety  Arthritis  Asthma, unspecified asthma severity, unspecified whether complicated, unspecified whether persistent (HHS-HCC)  DVT (deep venous thrombosis) (CMS/HHS-HCC)  GERD (gastroesophageal reflux disease)  History of cancer  History of stroke  Hyperlipidemia  Hypertension   Patient Active Problem List  Diagnosis  CVA (cerebral vascular accident) (CMS/HHS-HCC)  Osteoporosis  Palpitations  Personal history of tobacco use, presenting hazards to health  Rectal bleeding  Rectocele  Hx of radiation therapy  S/P kyphoplasty  History of total knee arthroplasty  Spinal stenosis  Myofascial pain syndrome of thoracic spine  Myofascial pain  Mixed incontinence  Malignant neoplasm of lower-outer quadrant of right breast of female, estrogen receptor positive (CMS/HHS-HCC)  Long term current use of aromatase inhibitor  Essential hypertension, benign  Gastro-esophageal reflux disease without esophagitis  Hyperlipidemia  COPD (chronic obstructive pulmonary disease) (CMS/HHS-HCC)  Contact dermatitis and eczema due to plant   Colon cancer screening  Chronic idiopathic constipation  Change in bowel habit  CAD in native artery  Backache  Back pain  Atypical chest pain  Atrial fibrillation (CMS/HHS-HCC)  Ataxia  Asthma (HHS-HCC)  Anxiety and depression  Anxiety  Acute hemorrhagic cystitis  Pain in joint of left shoulder  Pain of lumbar spine  Pain of cervical spine  Personal history of tobacco use, presenting hazards to health  Rash  Itching  Hearing loss  Ringing in the ears  Nasal congestion   Past Surgical History:  Procedure Laterality Date  BREAST LUMPECTOMY WITH NEEDLE LOCALIZATION AND AXILLARY SENTINEL LYMPH NODE BX Right 01/04/2012  LEFT TOTAL KNEE ARTHROPLASTY Left 12/08/2015  KYPHOPLASTY T11 N/A 06/15/2017  POSTERIOR REPAIR, PERINEOPLASTY CYSTOSCOPY N/A 08/30/2021    Allergies  Allergen Reactions  Augmentin [Amoxicillin-Pot Clavulanate] Itching, Rash and Other (See Comments)  PT DEVELOPED SEVERE RASH INVOLVING MUCUS MEMBRANES or SKIN NECROSIS WITH PENICILLINS: # # # YES # # #  Penicillins Hives, Itching, Other (See Comments) and Rash  Has patient had a PCN reaction causing immediate rash, facial/tongue/throat swelling, SOB or lightheadedness with hypotension: No HAS PT DEVELOPED SEVERE RASH INVOLVING MUCUS MEMBRANES or SKIN NECROSIS: # # # YES # # #  Has patient had a PCN reaction that required hospitalization: No Has patient had a PCN reaction occurring within the last 10 years: No  PT DEVELOPED SEVERE RASH INVOLVING MUCUS MEMBRANES or SKIN NECROSIS WITH PENICILLINS: # # # YES # # #  Sulfa (Sulfonamide Antibiotics) Itching, Hives and Unknown  Aspirin Hives and Unknown  Atorvastatin Other (See Comments)  Leg Pain/Joint Pain/Back Pain  Chlorhexidine Gluconate Rash  Chocolate Hives and Other (See Comments)  RUNNY NOSE  Coffee Extract (Coffea Arabica) Hives and Other (  See Comments)  RUNNY NOSE  Diclofenac Other (See Comments) and Unknown  Ibuprofen Hives, Other (See Comments) and  Unknown  RUNNY NOSE  Oxycodone Hcl Hives and Nausea And Vomiting  Shellfish Containing Products Hives, Other (See Comments) and Unknown  RUNNY NOSE  Methocarbamol Other (See Comments)   Current Outpatient Medications on File Prior to Visit  Medication Sig Dispense Refill  acetaminophen (TYLENOL) 650 MG ER tablet Take 650 mg by mouth every 8 (eight) hours as needed  albuterol MDI, PROVENTIL, VENTOLIN, PROAIR, HFA 90 mcg/actuation inhaler Inhale 2 Inhalations into the lungs every 6 (six) hours as needed  amLODIPine (NORVASC) 10 MG tablet Take 1 tablet by mouth once daily  anastrozole (ARIMIDEX) 1 mg tablet Take 1 mg by mouth once daily  biotin 5 mg capsule Take 5 mg by mouth once daily  capsaicin 0.025 % PtMd Apply 1 Application topically once daily as needed  cholecalciferol, vitamin D3, (VITAMIN D3) 125 mcg (5,000 unit) tablet Take 5,000 Units by mouth once daily  clopidogreL (PLAVIX) 75 mg tablet Take 1 tablet by mouth once daily  diphenhydrAMINE (BENADRYL) 25 mg tablet Take 25 mg by mouth every 6 (six) hours as needed for Allergies or Itching  docusate (COLACE) 100 MG capsule Take 100 mg by mouth 2 (two) times daily  escitalopram oxalate (LEXAPRO) 10 MG tablet Take 1 tablet by mouth once daily  fluticasone propion-salmeteroL (ADVAIR HFA) 230-21 mcg/actuation inhaler Inhale 2 inhalations into the lungs 2 (two) times daily  fluticasone propionate (FLONASE) 50 mcg/actuation nasal spray Place 1 spray into one nostril once daily  hydroCHLOROthiazide (MICROZIDE) 12.5 mg capsule Take 1 capsule by mouth 2 (two) times daily  ipratropium (ATROVENT) 21 mcg (0.03 %) nasal spray Place 2 sprays into both nostrils 2 (two) times daily  isosorbide mononitrate (IMDUR) 30 MG ER tablet Take 30 mg by mouth at bedtime  ketoconazole (NIZORAL) 2 % shampoo Apply 1 Application topically as directed SHAMPOO AND LATHER 1-2 TIMES A WEEK IN SCALP.  loratadine (CLARITIN) 10 mg tablet Take 10 mg by mouth once daily   montelukast (SINGULAIR) 10 mg tablet Take 10 mg by mouth at bedtime  nitroGLYcerin (NITROSTAT) 0.4 MG SL tablet Place 0.4 mg under the tongue every 5 (five) minutes as needed  omega-3 fatty acids/fish oil (FISH OIL) 340-1,000 mg capsule Take 1,000 mg by mouth once daily  ondansetron (ZOFRAN-ODT) 4 MG disintegrating tablet Take 4 mg by mouth every 8 (eight) hours as needed for Vomiting or Nausea  pantoprazole (PROTONIX) 40 MG DR tablet Take 1 tablet by mouth once daily  potassium chloride (KLOR-CON) 10 MEQ ER tablet Take 20 mEq by mouth 2 (two) times daily  REPATHA SURECLICK 140 mg/mL PnIj Inject 140 mg subcutaneously every 14 (fourteen) days  simvastatin (ZOCOR) 10 MG tablet Take 10 mg by mouth twice a week  tiZANidine (ZANAFLEX) 2 MG tablet Take 1 tablet by mouth at bedtime as needed  vibegron 75 mg Tab Take 1 tablet by mouth once daily   No current facility-administered medications on file prior to visit.   Family History  Problem Relation Age of Onset  Stroke Mother  Coronary Artery Disease (Blocked arteries around heart) Father    Social History   Tobacco Use  Smoking Status Former  Types: Cigarettes  Smokeless Tobacco Never    Social History   Socioeconomic History  Marital status: Widowed  Tobacco Use  Smoking status: Former  Types: Cigarettes  Smokeless tobacco: Never  Vaping Use  Vaping status: Never Used  Substance and Sexual Activity  Alcohol use: Never  Drug use: Never   Social Determinants of Health   Financial Resource Strain: Low Risk (05/25/2022)  Received from [facility]  Overall Financial Resource Strain (CARDIA)  Difficulty of Paying Living Expenses: Not hard at all  Food Insecurity: No Food Insecurity (05/25/2022)  Received from [facility]  Hunger Vital Sign  Worried About Running Out of Food in the Last Year: Never true  Ran Out of Food in the Last Year: Never true  Transportation Needs: No Transportation Needs (05/25/2022)  Received from  [facility] [place] - Transportation  Lack of Transportation (Medical): No  Lack of Transportation (Non-Medical): No  Physical Activity: Insufficiently Active (11/26/2020)  Received from [facility]  Exercise Vital Sign  Days of Exercise per Week: 3 days  Minutes of Exercise per Session: 30 min  Received from [facility]  Social Network   Objective:  There were no vitals filed for this visit.  There is no height or weight on file to calculate BMI.  Physical Exam Exam conducted with a chaperone present.  Cardiovascular:  Rate and Rhythm: Normal rate.  Pulmonary:  Effort: Pulmonary effort is normal.  Chest:  Breasts: Left: Normal.   Comments: Bruising noted right chest Scar of surgery right chest Musculoskeletal:  Cervical back: Normal range of motion.  Lymphadenopathy:  Upper Body:  Right upper body: No supraclavicular or axillary adenopathy.  Left upper body: No supraclavicular or axillary adenopathy.  Skin: General: Skin is warm.  Neurological:  General: No focal deficit present.  Mental Status: She is alert.  Psychiatric:  Mood and Affect: Mood normal.  Behavior: Behavior normal.     Labs, Imaging and Diagnostic Testing: Diagnosis Breast, right, needle core biopsy, 7:00 INVASIVE DUCTAL CARCINOMA, WITH MICROPAPILLARY FEATURES TUBULE FORMATION: SCORE 3 NUCLEAR PLEOMORPHISM: SCORE 2 MITOTIC COUNT: SCORE 1 TOTAL SCORE: 6 OVERALL GRADE: 2 LYMPHOVASCULAR INVASION: NOT IDENTIFIED CANCER LENGTH: 7 MM CALCIFICATIONS: NOT IDENTIFIED OTHER FINDINGS: NONE SEEN NOTE Diagnosis Note Dr. Luisa Hart reviewed the case and concurs with the interpretation. A breast prognostic profile (ER and PR) is pending and will be reported in an addendum. [facility] was notified on 08/10/2022. Lance Coon MD Pathologist, Electronic Signature (Case signed 08/10/2022) Specimen Gross and Clinical Information Specimen Comment TIF: 2:15 pm, CIT: < 30 sec,  mass Specimen(s) Obtained: Breast, right, needle core biopsy, 7:00 1 of 3 FINAL for ADAMS, SHARON A [id number]) Specimen Clinical Information C/F IDC Gross The specimen is received in formalin labeled with the patient's name and right breast 7 o'clock and consists of three cores of tan yellow fibroadipose tissue, ranging from 1.6 x 0.2 x 0.1 cm to 2.2 x 0.2 x 0.1 cm. The specimen is entirely submitted in one cassette. Time in formalin 2:15 p.m. on 08/09/22. CIT less than 30 seconds. (KL:kh 08/10/22) Stain(s) used in Diagnosis: The following stain(s) were used in diagnosing the case: Her2 FISH. The control(s) stained appropriately. ADDITIONAL INFORMATION: 1A. Breast,right,needle core biopsy, 7:00/f FLUORESCENCE IN-SITU HYBRIDIZATION Results: GROUP 5: HER2 **NEGATIVE** Equivocal form of amplification of the HER2 gene was detected in the IHC 2+ tissue sample received from this individual. HER2 FISH was performed by a technologist and cell imaging and analysis on the [employer]. RATIO OF HER2/CEN17 SIGNALS 1.31 AVERAGE HER2 COPY NUMBER PER CELL 3.55 The ratio of HER2/CEN 17 is within the range < 2.0 of HER2/CEN 17 and a copy number of HER2 signals per cell is <4.0. Arch Pathol Lab Med  1:1,2018 Jerene Bears MD Pathologist, Electronic Signature ( Signed 08/17/2022) 1)Breast, right,needle core biopsy, 7:00 PROGNOSTIC INDICATORS Results: IMMUNOHISTOCHEMICAL AND MORPHOMETRIC ANALYSIS PERFORMED MANUALLY The tumor cells are EQUIVOICAL for Her2 (2+). Her 2 Fish will be performed and results sent separately Estrogen Receptor: 100%, POSITIVE, STRONG STAINING INTENSITY Progesterone Receptor: 80%, POSITIVE, STRONG STAINING INTENSITY Proliferation Marker Ki67: 20% REFERENCE RANGE ESTROGEN RECEPTOR NEGATIVE 0% POSITIVE =>1% 2 of 3 FINAL for ADAMS, SHARON A ([id number]) ADDITIONAL INFORMATION:(continued) REFERENCE RANGE PROGESTERONE RECEPTOR NEGATIVE 0% POSITIVE =>1% All controls stained  appropriately Jimmy Picket MD Pathologist, Electronic Signature CLINICAL DATA: Screening recall for a possible left breast asymmetry and possible right breast masses.  This patient has a significant history for core needle biopsies of 2 right breast masses in 2019. Patient was a recall from screening from an exam dated 07/17/2017 for possible right breast masses. Diagnostic imaging on 07/21/2017 revealed a bilobed mass in the right breast at 9 o'clock, felt to be probably benign with short-term follow-up recommended. She underwent follow-up diagnostic imaging on 01/22/2018 at which time sonography demonstrated a 7 mm irregular lesion at 8:30 o'clock and a small lesion at 9 o'clock. Ultrasound-guided core needle biopsy of these lesions was recommended. Patient underwent biopsy of lesions on 01/26/2018 with benign concordant results. On 01/21/2018 she was reassessed for bruising related to the biopsy. At that time she had a large right breast hematoma. This was reassessed with ultrasound on 03/30/2018 with persistence of the large hematoma. See subsequently developed large area of fat necrosis and associated dystrophic calcifications in the right breast.  EXAM: DIGITAL DIAGNOSTIC BILATERAL MAMMOGRAM WITH TOMOSYNTHESIS; ULTRASOUND RIGHT BREAST LIMITED; ULTRASOUND LEFT BREAST LIMITED  TECHNIQUE: Bilateral digital diagnostic mammography and breast tomosynthesis was performed.; Targeted ultrasound examination of the right breast was performed; Targeted ultrasound examination of the left breast was performed.  COMPARISON: Previous exam(s).  ACR Breast Density Category b: There are scattered areas of fibroglandular density.  FINDINGS: In the right breast, 2 adjacent small irregular masses persist in the anterior breast on spot compression imaging. These lie anterior to changes subsequent to the large right breast hematoma.  In the left breast, a small oval mass persists in the  lateral breast. Mass is smoothly circumscribed.  Targeted right breast ultrasound is performed, showing 2 adjacent hypoechoic irregular masses in the right breast at 7 o'clock, 1 cm from the nipple, the larger measuring 9 x 5 x 8 mm, smaller, which lies just anterior, but within 5-6 mm, measuring 6 mm where it is best defined in the anti radial projection. Neither demonstrates internal blood flow on color Doppler analysis. Sonographic imaging of the right axilla demonstrates normal lymph nodes. No enlarged or abnormal lymph nodes.  Targeted left breast ultrasound is performed, showing 3 small cysts at 2 o'clock, 2 cm the nipple, 1 superficial to the other, both measuring 4 mm. No solid masses or suspicious lesions.  IMPRESSION: 1. Two adjacent small hypoechoic irregular masses the right breast at 7 o'clock, 1 cm from the nipple, anterior depth. Tissue sampling is recommended the more posterior of these, which is larger and better defined. These masses correspond to the masses seen mammographically. No left axillary lymphadenopathy. 2. Two small benign left breast cysts, 1 of which accounts for the mammographic mass/asymmetry. No evidence of left breast malignancy.  RECOMMENDATION: 1. Ultrasound-guided core needle biopsy of the 9 mm mass in the right breast at 7 o'clock, 1 cm from the nipple. This procedure was scheduled prior to the patient being discharged from  the Breast Center. Patient may choose not to undergo this biopsy, since she had the significant complication from the previous biopsies. If she declines biopsy, then six-month follow-up right breast mammography and ultrasound would be recommended.  I have discussed the findings and recommendations with the patient. If applicable, a reminder letter will be sent to the patient regarding the next appointment.  BI-RADS CATEGORY 4: Suspicious.   Electronically Signed By: Amie Portland M.D. On: 07/22/2022  11:48    Assessment and Plan:   Diagnoses and all orders for this visit:  Malignant neoplasm of lower-outer quadrant of right breast of female, estrogen receptor positive (CMS/HHS-HCC)   History of right breast cancer status post breast conserving surgery radiation therapy in 2014. This is a low-grade invasive lobular carcinoma measuring 1.8 cm. We discussed about repeat lumpectomy. I reviewed the literature showing there may be a slight increased risk of recurrence but she might be a candidate for additional radiation therapy. We discussed omission of the sentinel lymph node in the circumstance which she agrees and she has had previous surgery and does have lymphedema issues already. Recommend right breast seed localized lumpectomy.The procedure has been discussed with the patient. Alternatives to surgery have been discussed with the patient. Risks of surgery include bleeding, Infection, Seroma formation, death, and the need for further surgery. The patient understands and wishes to proceed.   Reviewed mastectomy as well as reconstruction. She would like to avoid that we discussed the pros and cons of mastectomy reconstruction, lumpectomy and local regional recurrence in the circumstance with the above.   Hayden Rasmussen, MD

## 2022-09-28 NOTE — Anesthesia Preprocedure Evaluation (Addendum)
Anesthesia Evaluation  Patient identified by MRN, date of birth, ID band Patient awake    Reviewed: Allergy & Precautions, NPO status , Patient's Chart, lab work & pertinent test results  History of Anesthesia Complications (+) PONV and history of anesthetic complications  Airway Mallampati: II  TM Distance: >3 FB Neck ROM: Full    Dental no notable dental hx.    Pulmonary asthma , COPD,  COPD inhaler, former smoker   Pulmonary exam normal        Cardiovascular hypertension, Pt. on medications + Peripheral Vascular Disease  Normal cardiovascular exam     Neuro/Psych  PSYCHIATRIC DISORDERS Anxiety Depression    CVA    GI/Hepatic Neg liver ROS,GERD  Medicated and Controlled,,  Endo/Other  negative endocrine ROS    Renal/GU negative Renal ROS     Musculoskeletal  (+) Arthritis ,  Ambulates with cane, walker, and wheelchair   Abdominal   Peds  Hematology  (+) Blood dyscrasia (Plavix)   Anesthesia Other Findings RIGHT BREAST CANCER  Reproductive/Obstetrics                             Anesthesia Physical Anesthesia Plan  ASA: 3  Anesthesia Plan: General   Post-op Pain Management:    Induction: Intravenous  PONV Risk Score and Plan: 4 or greater and Propofol infusion, Ondansetron, Dexamethasone and Treatment may vary due to age or medical condition  Airway Management Planned: LMA  Additional Equipment:   Intra-op Plan:   Post-operative Plan: Extubation in OR  Informed Consent: I have reviewed the patients History and Physical, chart, labs and discussed the procedure including the risks, benefits and alternatives for the proposed anesthesia with the patient or authorized representative who has indicated his/her understanding and acceptance.     Dental advisory given  Plan Discussed with: CRNA  Anesthesia Plan Comments:        Anesthesia Quick Evaluation

## 2022-09-28 NOTE — Transfer of Care (Signed)
Immediate Anesthesia Transfer of Care Note  Patient: Alexandria [name]  Procedure(s) Performed: RIGHT BREAST LUMPECTOMY WITH RADIOACTIVE SEED LOCALIZATION (Right: Breast)  Patient Location: PACU  Anesthesia Type:General  Level of Consciousness: sedated  Airway & Oxygen Therapy: Patient Spontanous Breathing and Patient connected to face mask oxygen  Post-op Assessment: Report given to RN and Post -op Vital signs reviewed and stable  Post vital signs: Reviewed and stable  Last Vitals:  Vitals Value Taken Time  BP 127/55 09/28/22 1515  Temp 36.2 C 09/28/22 1515  Pulse 77 09/28/22 1518  Resp 14 09/28/22 1518  SpO2 100 % 09/28/22 1518  Vitals shown include unfiled device data.  Last Pain:  Vitals:   09/28/22 1315  TempSrc: Temporal  PainSc: 10-Worst pain ever      Patients Stated Pain Goal: 5 (09/28/22 1315)  Complications: No notable events documented.

## 2022-09-28 NOTE — Discharge Instructions (Addendum)
 [facility] Office Phone Number [phone]  BREAST BIOPSY/ PARTIAL MASTECTOMY: POST OP INSTRUCTIONS  Always review your discharge instruction sheet given to you by the facility where your surgery was performed.  IF YOU HAVE DISABILITY OR FAMILY LEAVE FORMS, YOU MUST BRING THEM TO THE OFFICE FOR PROCESSING.  DO NOT GIVE THEM TO YOUR DOCTOR.  A prescription for pain medication may be given to you upon discharge.  Take your pain medication as prescribed, if needed.  If narcotic pain medicine is not needed, then you may take acetaminophen (Tylenol) or ibuprofen (Advil) as needed. Take your usually prescribed medications unless otherwise directed If you need a refill on your pain medication, please contact your pharmacy.  They will contact our office to request authorization.  Prescriptions will not be filled after 5pm or on week-ends. You should eat very light the first 24 hours after surgery, such as soup, crackers, pudding, etc.  Resume your normal diet the day after surgery. Most patients will experience some swelling and bruising in the breast.  Ice packs and a good support bra will help.  Swelling and bruising can take several days to resolve.  It is common to experience some constipation if taking pain medication after surgery.  Increasing fluid intake and taking a stool softener will usually help or prevent this problem from occurring.  A mild laxative (Milk of Magnesia or Miralax) should be taken according to package directions if there are no bowel movements after 48 hours. Unless discharge instructions indicate otherwise, you may remove your bandages 24-48 hours after surgery, and you may shower at that time.  You may have steri-strips (small skin tapes) in place directly over the incision.  These strips should be left on the skin for 7-10 days.  If your surgeon used skin glue on the incision, you may shower in 24 hours.  The glue will flake off over the next 2-3 weeks.  Any  sutures or staples will be removed at the office during your follow-up visit. ACTIVITIES:  You may resume regular daily activities (gradually increasing) beginning the next day.  Wearing a good support bra or sports bra minimizes pain and swelling.  You may have sexual intercourse when it is comfortable. You may drive when you no longer are taking prescription pain medication, you can comfortably wear a seatbelt, and you can safely maneuver your car and apply brakes. RETURN TO WORK:  ______________________________________________________________________________________ You should see your doctor in the office for a follow-up appointment approximately two weeks after your surgery.  Your doctor's nurse will typically make your follow-up appointment when she calls you with your pathology report.  Expect your pathology report 2-3 business days after your surgery.  You may call to check if you do not hear from us after three days. OTHER INSTRUCTIONS: _______________________________________________________________________________________________ _____________________________________________________________________________________________________________________________________ _____________________________________________________________________________________________________________________________________ _____________________________________________________________________________________________________________________________________  WHEN TO CALL YOUR DOCTOR: Fever over 101.0 Nausea and/or vomiting. Extreme swelling or bruising. Continued bleeding from incision. Increased pain, redness, or drainage from the incision.  The clinic staff is available to answer your questions during regular business hours.  Please don't hesitate to call and ask to speak to one of the nurses for clinical concerns.  If you have a medical emergency, go to the nearest emergency room or call 911.  A surgeon from [facility] is always on call at the hospital.  For further questions, please visit [email or web address]    Post Anesthesia Home Care Instructions  Activity: Get plenty of rest for the remainder of   the day. A responsible individual must stay with you for 24 hours following the procedure.  For the next 24 hours, DO NOT: -Drive a car -Operate machinery -Drink alcoholic beverages -Take any medication unless instructed by your physician -Make any legal decisions or sign important papers.  Meals: Start with liquid foods such as gelatin or soup. Progress to regular foods as tolerated. Avoid greasy, spicy, heavy foods. If nausea and/or vomiting occur, drink only clear liquids until the nausea and/or vomiting subsides. Call your physician if vomiting continues.  Special Instructions/Symptoms: Your throat may feel dry or sore from the anesthesia or the breathing tube placed in your throat during surgery. If this causes discomfort, gargle with warm salt water. The discomfort should disappear within 24 hours.  If you had a scopolamine patch placed behind your ear for the management of post- operative nausea and/or vomiting:  1. The medication in the patch is effective for 72 hours, after which it should be removed.  Wrap patch in a tissue and discard in the trash. Wash hands thoroughly with soap and water. 2. You may remove the patch earlier than 72 hours if you experience unpleasant side effects which may include dry mouth, dizziness or visual disturbances. 3. Avoid touching the patch. Wash your hands with soap and water after contact with the patch.      

## 2022-09-29 ENCOUNTER — Encounter (HOSPITAL_BASED_OUTPATIENT_CLINIC_OR_DEPARTMENT_OTHER): Payer: Self-pay | Admitting: Surgery

## 2022-09-29 NOTE — Anesthesia Postprocedure Evaluation (Signed)
Anesthesia Post Note  Patient: SHEZA STRICKLAND  Procedure(s) Performed: RIGHT BREAST LUMPECTOMY WITH RADIOACTIVE SEED LOCALIZATION (Right: Breast)     Patient location during evaluation: PACU Anesthesia Type: General Level of consciousness: awake Pain management: pain level controlled Vital Signs Assessment: post-procedure vital signs reviewed and stable Respiratory status: spontaneous breathing, nonlabored ventilation and respiratory function stable Cardiovascular status: blood pressure returned to baseline and stable Postop Assessment: no apparent nausea or vomiting Anesthetic complications: no   No notable events documented.  Last Vitals:  Vitals:   09/28/22 1545 09/28/22 1606  BP: 133/62 (!) 154/62  Pulse: 76 85  Resp: 16 16  Temp:  (!) 36.2 C  SpO2: 99% 95%    Last Pain:  Vitals:   09/28/22 1612  TempSrc:   PainSc: 4                  Zayneb Baucum P Shenee Wignall

## 2022-10-04 ENCOUNTER — Telehealth: Payer: Self-pay

## 2022-10-04 NOTE — Telephone Encounter (Signed)
Received secure chat from [place], [place] that Pt left lengthy VM about a recent fall and subsequent back pain. Asked for call back.

## 2022-10-05 ENCOUNTER — Encounter: Payer: Self-pay | Admitting: Surgery

## 2022-10-05 ENCOUNTER — Encounter: Payer: Self-pay | Admitting: *Deleted

## 2022-10-05 LAB — SURGICAL PATHOLOGY

## 2022-10-06 ENCOUNTER — Telehealth: Payer: Self-pay | Admitting: Radiation Oncology

## 2022-10-06 NOTE — Telephone Encounter (Signed)
I spoke with the patient to let her know that Dr. Mitzi Hansen reviewed her pathology results and she does not need additional radiation. She was very happy to hear this and she will follow up next week with Dr. Pamelia Hoit.

## 2022-10-09 NOTE — Progress Notes (Signed)
Patient Care Team: [name], NP as PCP - General (Family Medicine) Chilton Si, MD as PCP - Cardiology (Cardiology) Charna Elizabeth, MD as Consulting Physician (Gastroenterology) Sherre Scarlet, MD as Referring Physician (Obstetrics and Gynecology) Serena Croissant, MD as Consulting Physician (Hematology and Oncology) Harriette Bouillon, MD as Consulting Physician (General Surgery) Dorothy Puffer, MD as Consulting Physician (Radiation Oncology) Pershing Proud, RN as Oncology Nurse Navigator Donnelly Angelica, RN as Oncology Nurse Navigator  DIAGNOSIS: No diagnosis found.  SUMMARY OF ONCOLOGIC HISTORY: Oncology History  Breast cancer of upper-inner quadrant of right female breast (HCC)  01/04/2012 Surgery   Right breast lumpectomy and a 1.6 cm IDC with negative margins, 1 SLN negative grade 1 with low-grade DCIS, ER 100%, PR 0%, HER-2 negative ratio 1.14, Ki-67 27% T1 C. 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%, HER2 -0    Malignant neoplasm of lower-outer quadrant of right breast of female, estrogen receptor positive (HCC)  08/16/2022 Initial Diagnosis   Malignant neoplasm of lower-outer quadrant of right breast of female, estrogen receptor positive (HCC)   08/17/2022 Cancer Staging   Staging form: Breast, AJCC 8th Edition - Clinical stage from 08/17/2022: Stage IA (cT1c, cN0, cM0, G2, ER+, PR-, HER2-) - Signed by Ronny Bacon, PA-C on 08/17/2022 Stage prefix: Initial diagnosis Method of lymph node assessment: Clinical Histologic grading system: 3 grade system    Genetic Testing   [facility] Custom Panel+RNA was  Negative. Report date is 08/23/2022.  The Custom Hereditary Cancers Panel offered by [facility] includes sequencing and/or deletion duplication testing of the following 43 genes: APC, ATM, AXIN2, BAP1, BARD1, BMPR1A, BRCA1, BRCA2, BRIP1, CDH1, CDK4, CDKN2A (p14ARF and p16INK4a only), CHEK2, CTNNA1, EPCAM (Deletion/duplication testing only), FH, GREM1 (promoter region duplication testing only), HOXB13, KIT, MBD4, MEN1, MLH1, MSH2, MSH3, MSH6, MUTYH, NF1, NHTL1, PALB2, PDGFRA, PMS2, POLD1, POLE, PTEN, RAD51C, RAD51D, SMAD4, SMARCA4. STK11, TP53, TSC1, TSC2, and VHL.     CHIEF COMPLIANT:   INTERVAL HISTORY: Alexandria [name] is a   ALLERGIES:  is allergic to augmentin [amoxicillin-pot clavulanate], penicillins, aspirin, chlorhexidine gluconate, chocolate, coffee bean extract, diclofenac, ibuprofen, oxycodone hcl, shrimp [shellfish allergy], sulfonamide derivatives, lipitor [atorvastatin calcium], and methocarbamol.  MEDICATIONS:  Current Outpatient Medications  Medication Sig Dispense Refill   acetaminophen (TYLENOL) 650 MG CR tablet Take 650 mg by mouth every 8 (eight) hours as needed for pain.     albuterol (PROAIR HFA) 108 (90 Base) MCG/ACT inhaler Inhale 2 puffs into the lungs every 6 (six) hours as needed for wheezing or shortness of breath. 8 g 5   amLODipine (NORVASC) 10 MG tablet TAKE ONE TABLET EVERY DAY 90 tablet 1   ARNICA EX Apply 1 application topically daily as needed (pain).     b complex vitamins tablet Take 1 tablet by mouth 2 (two) times daily. PLUS FOLIC ACID PLUS VIT. C     Biotin 5000 MCG CAPS Take 5,000 mcg by mouth daily.      Capsaicin 0.025 % PADS Apply 1 application topically daily as needed (pain).     Cholecalciferol (VITAMIN D-3) 5000 UNITS TABS Take 5,000 Units by mouth  daily.      clopidogrel (PLAVIX) 75 MG tablet TAKE ONE TABLET BY MOUTH DAILY 90 tablet 3   CO-ENZYME Q10-VITAMIN E PO Take 1 tablet by mouth daily.     Diclofenac Sodium (PENNSAID) 2 % SOLN Apply 1  application topically 3 (three) times daily. (Patient taking differently: Apply 1 application  topically as needed.) 112 g 0   diphenhydrAMINE (BENADRYL) 25 MG tablet Take 25 mg by mouth every 6 (six) hours as needed. Patient taking 1/2 (12.5 mg) as needed for allergies and itching     escitalopram (LEXAPRO) 10 MG tablet TAKE ONE TABLET BY MOUTH ONCE DAILY 90 tablet 0   Evolocumab (REPATHA SURECLICK) 140 MG/ML SOAJ Inject 140 mg into the skin every 14 (fourteen) days. 6 mL 3   fluticasone (FLONASE) 50 MCG/ACT nasal spray Place 1 spray into both nostrils daily. 16 g 3   fluticasone-salmeterol (ADVAIR HFA) 230-21 MCG/ACT inhaler Inhale 2 puffs into the lungs 2 times daily. 12 g 0   hydrochlorothiazide (MICROZIDE) 12.5 MG capsule TAKE (1) CAPSULE TWICE DAILY. 180 capsule 3   ipratropium (ATROVENT) 0.03 % nasal spray Place 2 sprays into both nostrils every 12 (twelve) hours. 30 mL 0   isosorbide mononitrate (IMDUR) 30 MG 24 hr tablet Take 1 tablet (30 mg total) by mouth daily. Take at bedtime. 90 tablet 3   loratadine (CLARITIN) 10 MG tablet Take 10 mg by mouth daily as needed for allergies.     Misc Natural Products (OSTEO BI-FLEX ADV TRIPLE ST PO) Take 1 tablet by mouth daily.     montelukast (SINGULAIR) 10 MG tablet Take 1 tablet (10 mg total) by mouth at bedtime. 30 tablet 3   nitroGLYCERIN (NITROSTAT) 0.4 MG SL tablet Place 1 tablet (0.4 mg total) under the tongue every 5 (five) minutes as needed for chest pain. 25 tablet 3   ondansetron (ZOFRAN-ODT) 4 MG disintegrating tablet DISSOLVE 1 TABLET ON TONGUE EVERY 8 HOURS AS NEEDED FOR NAUSEA/VOMITING. 20 tablet 1   pantoprazole (PROTONIX) 40 MG tablet TAKE ONE TABLET BY MOUTH DAILY 90 tablet 3   Polyethyl Glycol-Propyl Glycol (SYSTANE OP) Place 1 drop into both eyes daily as needed. Dry eyes     potassium chloride (KLOR-CON) 10 MEQ tablet Take 2 tablets (20 mEq total) by mouth 2 (two) times daily. 360 tablet 2   Probiotic Product (ALIGN PO) Take 1  tablet by mouth daily.     Selenium 200 MCG CAPS Take 200 mcg by mouth 2 (two) times daily.      simvastatin (ZOCOR) 10 MG tablet Take 1 tablet (10 mg total) by mouth once a week. 13 tablet 3   traMADol (ULTRAM) 50 MG tablet TAKE ONE TABLET BY MOUTH EVERY 8 HOURS AS NEEDED 15 tablet 1   traMADol (ULTRAM) 50 MG tablet Take 1 tablet (50 mg total) by mouth every 6 (six) hours as needed. 20 tablet 0   Trolamine Salicylate (ASPERCREME EX) Apply 1 application topically daily as needed (pain).     UNABLE TO FIND Med Name: Neuriva Brain Supplement     Vibegron 75 MG TABS Take 1 tablet by mouth daily.     No current facility-administered medications for this visit.    PHYSICAL EXAMINATION: ECOG PERFORMANCE STATUS: {CHL ONC ECOG PS:[id number]}  There were no vitals filed for this visit. There were no vitals filed for this visit.  BREAST:*** No palpable masses or nodules in either right or left breasts. No palpable axillary supraclavicular or infraclavicular adenopathy no  breast tenderness or nipple discharge. (exam performed in the presence of a chaperone)  LABORATORY DATA:  I have reviewed the data as listed    Latest Ref Rng & Units 09/21/2022   10:12 AM 08/17/2022   12:08 PM 08/17/2022   12:00 AM  CMP  Glucose 70 - 99 mg/dL 846  92  87   BUN 8 - 23 mg/dL 17  21  21    Creatinine 0.44 - 1.00 mg/dL 9.62  9.52  8.41   Sodium 135 - 145 mmol/L 141  140  140   Potassium 3.5 - 5.1 mmol/L 4.5  3.6  3.9   Chloride 98 - 111 mmol/L 106  102  101   CO2 22 - 32 mmol/L 27  30  26    Calcium 8.9 - 10.3 mg/dL 9.5  9.8  9.6   Total Protein 6.5 - 8.1 g/dL  7.2  6.6   Total Bilirubin 0.3 - 1.2 mg/dL  1.1  0.8   Alkaline Phos 38 - 126 U/L  53  62   AST 15 - 41 U/L  18  19   ALT 0 - 44 U/L  11  10     Lab Results  Component Value Date   WBC 6.7 08/17/2022   HGB 11.2 (L) 08/17/2022   HCT 33.3 (L) 08/17/2022   MCV 89.5 08/17/2022   PLT 243 08/17/2022   NEUTROABS 3.6 08/17/2022    ASSESSMENT & PLAN:   No problem-specific Assessment & Plan notes found for this encounter.    No orders of the defined types were placed in this encounter.  The patient has a good understanding of the overall plan. she agrees with it. she will call with any problems that may develop before the next visit here. Total time spent: 30 mins including face to face time and time spent for planning, charting and co-ordination of care   Sherlyn Lick, CMA 10/09/22    I Janan Ridge am acting as a Neurosurgeon for [employer]  ***

## 2022-10-11 ENCOUNTER — Encounter: Payer: Self-pay | Admitting: *Deleted

## 2022-10-11 ENCOUNTER — Inpatient Hospital Stay: Payer: Medicare Other | Attending: Hematology and Oncology | Admitting: Hematology and Oncology

## 2022-10-11 ENCOUNTER — Ambulatory Visit (HOSPITAL_COMMUNITY)
Admission: RE | Admit: 2022-10-11 | Discharge: 2022-10-11 | Disposition: A | Discharge status: Home / Self Care | Payer: Medicare Other | Source: Ambulatory Visit | Attending: Hematology and Oncology | Admitting: Hematology and Oncology

## 2022-10-11 ENCOUNTER — Other Ambulatory Visit: Payer: Self-pay

## 2022-10-11 VITALS — BP 145/74 | HR 77 | Temp 97.8°F | Resp 18 | Ht 63.5 in | Wt 134.8 lb

## 2022-10-11 DIAGNOSIS — Z170 Estrogen receptor positive status [ER+]: Secondary | ICD-10-CM | POA: Insufficient documentation

## 2022-10-11 DIAGNOSIS — Z886 Allergy status to analgesic agent status: Secondary | ICD-10-CM | POA: Diagnosis not present

## 2022-10-11 DIAGNOSIS — Z882 Allergy status to sulfonamides status: Secondary | ICD-10-CM | POA: Insufficient documentation

## 2022-10-11 DIAGNOSIS — Z79899 Other long term (current) drug therapy: Secondary | ICD-10-CM | POA: Insufficient documentation

## 2022-10-11 DIAGNOSIS — Z881 Allergy status to other antibiotic agents status: Secondary | ICD-10-CM | POA: Insufficient documentation

## 2022-10-11 DIAGNOSIS — C50511 Malignant neoplasm of lower-outer quadrant of right female breast: Secondary | ICD-10-CM | POA: Diagnosis not present

## 2022-10-11 DIAGNOSIS — Z880 Allergy status to penicillin: Secondary | ICD-10-CM | POA: Insufficient documentation

## 2022-10-11 DIAGNOSIS — C50211 Malignant neoplasm of upper-inner quadrant of right female breast: Secondary | ICD-10-CM

## 2022-10-11 DIAGNOSIS — Z7902 Long term (current) use of antithrombotics/antiplatelets: Secondary | ICD-10-CM | POA: Insufficient documentation

## 2022-10-11 DIAGNOSIS — Z885 Allergy status to narcotic agent status: Secondary | ICD-10-CM | POA: Diagnosis not present

## 2022-10-11 DIAGNOSIS — Z79811 Long term (current) use of aromatase inhibitors: Secondary | ICD-10-CM | POA: Insufficient documentation

## 2022-10-11 MED ORDER — LETROZOLE 2.5 MG PO TABS: 2.5000 mg | ORAL_TABLET | Freq: Every day | ORAL | 3 refills | Status: DC

## 2022-10-11 NOTE — Assessment & Plan Note (Signed)
09/28/2022:Right lumpectomy: LCIS 1.5 cm, no residual invasive lobular cancer identified, margins negative, ER 100%, PR 0%, HER2 0, Ki-67 1% (on biopsy tumor size 0.4 cm, grade 1)  (01/04/2012:Right breast lumpectomy and a 1.6 cm IDC with negative margins, 1 SLN negative grade 1 with low-grade DCIS, ER 100%, PR 0%, HER-2 negative ratio 1.14, Ki-67 27% T1 C. N0 M0 stage IA Oncotype DX score 9 7%ROR: Adjuvant radiation followed by 5 years of anastrozole)   Pathology counseling: I discussed the final pathology report of the patient provided  a copy of this report. I discussed the margins as well as lymph node surgeries. We also discussed the final staging along with previously performed ER/PR and HER-2/neu testing.  Treatment plan: Adjuvant antiestrogen therapy with letrozole x 5 years Letrozole counseling: We discussed the risks and benefits of anti-estrogen therapy with aromatase inhibitors. These include but not limited to insomnia, hot flashes, mood changes, vaginal dryness, bone density loss, and weight gain. We strongly believe that the benefits far outweigh the risks. Patient understands these risks and consented to starting treatment. Planned treatment duration is 5 years.  Return to clinic in 3 months for survivorship care plan visit

## 2022-10-12 ENCOUNTER — Telehealth: Payer: Self-pay | Admitting: Adult Health

## 2022-10-12 NOTE — Telephone Encounter (Signed)
Pt requesting a call to discuss imaging results

## 2022-10-12 NOTE — Telephone Encounter (Signed)
If pt calls back please advised that imaging results need to come from oncologist. Kandee Keen did not order this.

## 2022-10-12 NOTE — Telephone Encounter (Signed)
Left message to return phone call.

## 2022-10-12 NOTE — Telephone Encounter (Signed)
Pt called back. Pt was read previous message from CMA. Pt states she does not believe she has to call her oncologist,  because they told her at the hospital to reach out to PCP. Pt added that she has more questions regarding outpatient care  and would like a call back to discuss.

## 2022-10-13 ENCOUNTER — Telehealth: Payer: Self-pay | Admitting: Physical Therapy

## 2022-10-13 ENCOUNTER — Other Ambulatory Visit (HOSPITAL_BASED_OUTPATIENT_CLINIC_OR_DEPARTMENT_OTHER): Payer: Self-pay | Admitting: Orthopaedic Surgery

## 2022-10-13 DIAGNOSIS — G8929 Other chronic pain: Secondary | ICD-10-CM

## 2022-10-13 NOTE — Telephone Encounter (Signed)
Pt called.  Requesting new PT referral to resume PT at [facility] [place] ONLY.  Please enter new referral if you agree.

## 2022-10-27 ENCOUNTER — Encounter (INDEPENDENT_AMBULATORY_CARE_PROVIDER_SITE_OTHER): Payer: Self-pay

## 2022-11-04 ENCOUNTER — Other Ambulatory Visit: Payer: Self-pay

## 2022-11-04 ENCOUNTER — Encounter: Payer: Self-pay | Admitting: Physical Therapy

## 2022-11-04 ENCOUNTER — Ambulatory Visit: Payer: Medicare Other | Admitting: Physical Therapy

## 2022-11-04 DIAGNOSIS — M5459 Other low back pain: Secondary | ICD-10-CM

## 2022-11-04 DIAGNOSIS — M546 Pain in thoracic spine: Secondary | ICD-10-CM

## 2022-11-04 DIAGNOSIS — M6281 Muscle weakness (generalized): Secondary | ICD-10-CM
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
## 2022-11-07 ENCOUNTER — Encounter: Payer: Self-pay | Admitting: Rehabilitative and Restorative Service Providers"

## 2022-11-07 ENCOUNTER — Ambulatory Visit (INDEPENDENT_AMBULATORY_CARE_PROVIDER_SITE_OTHER): Payer: Medicare Other | Admitting: Rehabilitative and Restorative Service Providers"

## 2022-11-07 ENCOUNTER — Other Ambulatory Visit (HOSPITAL_COMMUNITY): Payer: Self-pay

## 2022-11-07 DIAGNOSIS — M6281 Muscle weakness (generalized): Secondary | ICD-10-CM

## 2022-11-07 DIAGNOSIS — M546 Pain in thoracic spine: Secondary | ICD-10-CM

## 2022-11-07 DIAGNOSIS — M5459 Other low back pain: Secondary | ICD-10-CM

## 2022-11-07 NOTE — Therapy (Signed)
OUTPATIENT PHYSICAL THERAPY TREATMENT   Patient Name: Alexandria [name] MRN: [id number] DOB:07-06-1940, 82 y.o., female Today's Date: 11/07/2022  END OF SESSION:  PT End of Session - 11/07/22 1515     Visit Number 2    Number of Visits 17    Date for PT Re-Evaluation 12/30/22    Authorization Type [employer] medicare    Progress Note Due on Visit 10    PT Start Time 1507    PT Stop Time 1546    PT Time Calculation (min) 39 min    Activity Tolerance Patient tolerated treatment well    Behavior During Therapy WFL for tasks assessed/performed              Past Medical History:  Diagnosis Date   Allergy    Anemia    hx of    Anxiety    Arthritis    left knee, hands, back   Asthma    daily and prn inhalers   Atypical chest pain 11/26/2020   Basal cell carcinoma (BCC) 2019   Mohs on forehead early 2019   Brain aneurysm    Brainstem infarct, 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 since age 88   IBS (irritable bowel syndrome)    Long term current use of aromatase inhibitor 05/08/2012   Melanoma (HCC) 2019   excised from nose early 2019   Overactive bladder    Palpitations    Pneumonia    hx of x 2    PONV (postoperative nausea and vomiting)    PVD (peripheral vascular disease) (HCC)    varicose veins - left worse than right   Spinal stenosis    Stress incontinence    Stroke (HCC)    problem with expressive communicaiton   Traumatic hemopneumothorax  1982   left   Past Surgical History:  Procedure Laterality Date   ANKLE HARDWARE REMOVAL     right   BREAST BIOPSY  1976   right   BREAST BIOPSY Left 2019   benign lymph node   BREAST BIOPSY Right 08/10/2022   MM RT BREAST BX W LOC DEV 1ST LESION IMAGE BX SPEC STEREO GUIDE 08/10/2022 [facility]   BREAST BIOPSY  09/27/2022   MM RT RADIOACTIVE SEED LOC MAMMO GUIDE 09/27/2022 [facility]   BREAST LUMPECTOMY  2013   right with snbx   BREAST LUMPECTOMY WITH RADIOACTIVE SEED LOCALIZATION Right 09/28/2022   Procedure: RIGHT BREAST LUMPECTOMY WITH RADIOACTIVE SEED LOCALIZATION;  Surgeon: Harriette Bouillon, MD;  Location: [facility];  Service: General;  Laterality: Right;   CHOLECYSTECTOMY  1990s   COLONOSCOPY     CYSTOSCOPY  07/05/2005   KNEE SURGERY  1980s   left   KYPHOPLASTY N/A 06/15/2017   Procedure: KYPHOPLASTY T11;  Surgeon: Venita Lick, MD;  Location: [facility];  Service: Orthopedics;  Laterality: N/A;  90 mins   ORIF ANKLE FRACTURE  1980s   right    TOTAL KNEE ARTHROPLASTY Left 12/08/2015   Procedure: LEFT TOTAL KNEE ARTHROPLASTY;  Surgeon: Durene Romans, MD;  Location: [facility];  Service: Orthopedics;  Laterality: Left;   TRANSOBTURATOR SLING  07/05/2005   TUBAL LIGATION  1976   VEIN SURGERY     vein ablation left leg   Patient Active Problem List   Diagnosis Date Noted   Family history of breast cancer 08/18/2022   Genetic testing 08/17/2022   Malignant neoplasm of lower-outer quadrant of right breast of female, estrogen receptor positive (HCC) 08/16/2022   CAD in native artery 05/05/2022   Atypical chest pain 11/26/2020   S/P kyphoplasty 06/15/2017   S/P total knee replacement using cement 12/08/2015   Atrial fibrillation (HCC) 11/19/2015   Anxiety and depression 07/09/2015   Varicose veins of bilateral lower extremities with other complications 06/16/2015   Essential hypertension, benign 12/08/2014   Acute hemorrhagic cystitis 02/18/2014   Stroke  (HCC) 05/15/2012   Spinal stenosis    COPD (chronic obstructive pulmonary disease) (HCC)    Long term current use of aromatase inhibitor 05/08/2012   Hx of radiation therapy    Breast cancer of upper-inner quadrant of right female breast (HCC) 12/23/2011   Ataxia 06/23/2011   Contact dermatitis and eczema due to plant 01/04/2011   BACK PAIN 08/11/2009   PALPITATIONS, RECURRENT 02/27/2009   PERS HX TOBACCO USE PRESENTING HAZARDS HEALTH 12/17/2008   Hyperlipidemia 02/26/2008   ASTHMA 02/26/2008   GERD 02/12/2007    PCP: [name], NP  REFERRING PROVIDER: Huel Cote, MD  REFERRING DIAG: [phone] (ICD-10-CM) - Chronic bilateral thoracic back pain  Rationale for Evaluation and Treatment: Rehabilitation  THERAPY DIAG:  Pain in thoracic spine  Other low back pain  Muscle weakness (generalized)  ONSET DATE: exacerbation since July 2024, initial onset in 1980s  SUBJECTIVE:                                                                                                                                                                                           SUBJECTIVE STATEMENT: Reported 5-6/10 symptoms upon arrival.  Reported feeling soreness on Saturday for "whole body".  Reported no medicine since last visit.  Reported Lt side worse than Rt side.    PERTINENT HISTORY:  stroke, HTN, afib, CAD, BIL varicose veins, COPD, ataxia, breast cancer s/p localized lumpectomy on 09/28/22, anxiety, prior kyphoplasty T11, hx DVT, incidental finding of brain aneurysm, prior LLE MSK surgeries   PAIN:  Are you having pain: yes, 5-6/10 Location/description: mid back  and low back, L sided only. Sometimes refers into upper back and neck - aggravating factors: standing, walking, housework, driving.  - Easing factors: external support (pillow), rest, medication, leaning forward, self massage   PRECAUTIONS: hx stroke, cancer, aneurysm, DVT hx   WEIGHT BEARING RESTRICTIONS:  No  FALLS:  Has patient fallen in last 6 months? Yes. Number of falls 1 in July 2024 - does endorse a lot of near falls. Has been using QC for several years  LIVING ENVIRONMENT: Lives alone Has QC (ambulates into clinic with) RW  OCCUPATION: retired Engineer, civil (consulting)  PLOF: Independent - prior to fall in July, using cane for several years  PATIENT GOALS: reduce pain, be able to do more activities  NEXT MD VISIT: TBD per pt, nothing yet scheduled  OBJECTIVE:   DIAGNOSTIC FINDINGS:  11/04/2022 XR Thoracic 10/17/22 (refer to [facility] for details) "IMPRESSION: 1. No acute abnormality. 2. Stable T11 compression deformity with kyphoplasty material. 3. Stable degenerative changes."  April 2024 Thoracic MRI (refer to [facility] for details) "IMPRESSION: 1. No acute findings or explanation for the patient's symptoms. 2. Stable chronic healed T11 compression deformity status post spinal augmentation. 3. Mild thoracic spondylosis without significant spinal stenosis or nerve root encroachment."  PATIENT SURVEYS:  11/04/2022 FOTO 34 current, 43 predicted  SCREENING FOR RED FLAGS: 11/04/2022 MRI as above - pt endorses chronic bilateral LE numbness, recent history of cancer. Continues to follow with PCP and oncology  COGNITION: 11/04/2022 Overall cognitive status: Within functional limits for tasks assessed  - chart review notes history of expressive aphasia although this is not overtly evident in conversation today   SENSATION: 11/04/2022 Endorses chronic history of BLE numbness posteriorly  POSTURE: 11/04/2022  noted kyphosis and forward head posture   PALPATION: 11/04/2022 Concordant tenderness superior QL on L side, thoracolumbar paraspinals  LUMBAR ROM:   AROM 11/04/2022  Flexion Mid shin with pain in low back/hips  Extension NT  Right lateral flexion   Left lateral flexion   Right rotation <25% *  Left rotation <25% *   (Blank rows = not tested) (Key: WFL = within functional limits not  formally assessed, * = concordant pain, s = stiffness/stretching sensation, NT = not tested)   RANGE OF MOTION:     Active  Right 11/04/2022 Left 11/04/2022  Shoulder flexion    Shoulder abduction    Functional ER combo    Functional IR combo    Knee extension    Ankle dorsiflexion     (Blank rows = not tested) (Key: WFL = within functional limits not formally assessed, * = concordant pain, s = stiffness/stretching sensation, NT = not tested)  Comments:    STRENGTH TESTING:  MMT Right 11/04/2022 Left 11/04/2022  Shoulder flexion    Shoulder abduction    Elbow flexion    Elbow extension    Grip strength (gross)    Hip flexion 4 * 3+ *  Hip abduction (modified sitting) 5 5  Knee flexion 4 * 4 *  Knee extension 4 3+ *  Ankle dorsiflexion    Ankle plantarflexion     (Blank rows = not tested) (Key: WFL = within functional limits not formally assessed, * = concordant pain, s = stiffness/stretching sensation, NT = not tested)  Comments:    LUMBAR SPECIAL TESTS:  11/04/2022 deferred  FUNCTIONAL TESTS:  11/04/2022 TUG 8.57 sec QC 5xSTS: 9.57 sec with UE support   GAIT: 11/07/2022 Distance walked: within clinic Assistive device utilized: Quad cane large base Level  of assistance: Modified independence Comments: mild variability in BOS although no overt ataxia apparent, limited WB through QC which is utilized in L UE.   TODAY'S TREATMENT:                                                                                            DATE: 11/07/2022 Therex: Nustep Lvl 5 10 mins UE/LE  Supine lumbar trunk rotation 15 sec x 5 bilateral Supine bridge 2 x 10 2-3 sec hold  Supine blue band isometric hold with clam shell x 20 bilateral Standing red band rows x 20 bilateral Standing red band GH ext x 20 bilaterally     TODAY'S TREATMENT:                                                                                            DATE: 11/04/22 Deferred given time constraints      PATIENT EDUCATION:  Education details: HEP established Person educated: Patient Education method: Explanation, Demonstration, Tactile cues, Verbal cues Education comprehension: verbalized understanding, returned demonstration, verbal cues required, tactile cues required, and needs further education    HOME EXERCISE PROGRAM: Access Code: [email or web address] URL: [email or web address] Date: 11/07/2022 Prepared by: Chyrel Masson  Exercises - Supine Lower Trunk Rotation  - 2-3 x daily - 7 x weekly - 1 sets - 3-5 reps - 15 hold - Supine Bridge  - 1-2 x daily - 7 x weekly - 1-2 sets - 10 reps - 2 hold - Clamshell  - 1-2 x daily - 7 x weekly - 3 sets - 10 reps - Seated Scapular Retraction  - 2-3 x daily - 7 x weekly - 1 sets - 10 reps - 3-5 hold - Cervical Retraction at Wall  - 2-3 x daily - 7 x weekly - 1 sets - 10 reps - 5 hold   ASSESSMENT:  CLINICAL IMPRESSION: Spent time today to established some mobility based HEP for neck/back.  Good tolerance overall with minimal fatigue in clinic noted.  Encouraged use of help improve mobility of cervical, thoracic and lumbar spine.  Continued skilled PT services warranted at this time.    OBJECTIVE IMPAIRMENTS: Abnormal gait, decreased activity tolerance, decreased balance, decreased endurance, decreased mobility, difficulty walking, decreased ROM, decreased strength, improper body mechanics, postural dysfunction, and pain.   ACTIVITY LIMITATIONS: carrying, lifting, bending, sitting, standing, squatting, transfers, and locomotion level  PARTICIPATION LIMITATIONS: meal prep, cleaning, laundry, community activity, and yard work  PERSONAL FACTORS: Age, Time since onset of injury/illness/exacerbation, and 3+ comorbidities: cancer, anxiety, COPD, stroke, Afib, HTN, prior kyphoplasty, hx DVT, aneurysm  are also affecting patient's functional outcome.   REHAB POTENTIAL: Fair given chronicity and comorbidities  CLINICAL DECISION MAKING:  Evolving/moderate complexity  EVALUATION COMPLEXITY: Moderate   GOALS: Goals reviewed with  patient? Yes  SHORT TERM GOALS: Target date: 12/02/2022 Pt will demonstrate appropriate understanding and performance of initially prescribed HEP in order to facilitate improved independence with management of symptoms.   Goal status: on going 11/07/2022   2. Pt will score greater than or equal to 38 on FOTO in order to demonstrate improved perception of function due to symptoms.   Goal status: on going 11/07/2022    LONG TERM GOALS: Target date: 12/30/2022 Pt will score 43 on FOTO in order to demonstrate improved perception of functional status due to symptoms.  Baseline: 34 Goal status: INITIAL  2.  Pt will demonstrate at least 75% and painless lumbar rotation AROM bilaterally in order to demonstrate improved tolerance to functional movement patterns. Baseline: see ROM chart above Goal status: INITIAL  3.  Pt will demonstrate grossly symmetrical MMT of bilateral hips and knees in order to demonstrate improved strength for functional movements.  Baseline: see MMT chart above Goal status: INITIAL  4. Pt will demonstrate appropriate performance of final prescribed HEP in order to facilitate improved self-management of symptoms post-discharge.   Baseline: initial HEP TBD  Goal status: INITIAL    5. Pt will report at least 50% decrease in overall pain levels in past week in order to facilitate improved tolerance to basic ADLs/mobility.   Baseline: 6-11/10  Goal status: INITIAL    6. Pt will endorse/demonstrate ability to perform typical household tasks such as cleaning and vacuuming with less than 3 pt increase in resting pain in order to facilitate improved tolerance to ADLs.  Baseline: reports having to use cleaning service since July  Goal status: INITIAL    PLAN:  PT FREQUENCY: 2x/week  PT DURATION: 8 weeks  PLANNED INTERVENTIONS: Therapeutic exercises, Therapeutic activity,  Neuromuscular re-education, Balance training, Gait training, Patient/Family education, Self Care, Joint mobilization, Stair training, Dry Needling, Spinal mobilization, Cryotherapy, Taping, Manual therapy, and Re-evaluation.  PLAN FOR NEXT SESSION: Recheck response to HEP additions.    recent cancer hx and lumpectomy.    Chyrel Masson, PT, DPT, OCS, ATC 11/07/22  3:47 PM

## 2022-11-16 ENCOUNTER — Encounter: Payer: Medicare Other | Admitting: Physical Therapy

## 2022-11-21 ENCOUNTER — Ambulatory Visit (INDEPENDENT_AMBULATORY_CARE_PROVIDER_SITE_OTHER): Payer: Medicare Other | Admitting: Rehabilitative and Restorative Service Providers"

## 2022-11-21 ENCOUNTER — Encounter: Payer: Self-pay | Admitting: Rehabilitative and Restorative Service Providers"

## 2022-11-21 DIAGNOSIS — M5459 Other low back pain: Secondary | ICD-10-CM | POA: Diagnosis not present

## 2022-11-21 DIAGNOSIS — M546 Pain in thoracic spine: Secondary | ICD-10-CM

## 2022-11-21 DIAGNOSIS — R2689 Other abnormalities of gait and mobility: Secondary | ICD-10-CM

## 2022-11-21 DIAGNOSIS — R278 Other lack of coordination: Secondary | ICD-10-CM

## 2022-11-21 DIAGNOSIS — M6281 Muscle weakness (generalized): Secondary | ICD-10-CM

## 2022-11-21 NOTE — Therapy (Signed)
OUTPATIENT PHYSICAL THERAPY TREATMENT   Patient Name: Alexandria [name] MRN: [id number] DOB:08/30/1940, 82 y.o., female Today's Date: 11/21/2022  END OF SESSION:  PT End of Session - 11/21/22 1257     Visit Number 3    Number of Visits 17    Date for PT Re-Evaluation 12/30/22    Authorization Type [employer] medicare    Progress Note Due on Visit 10    PT Start Time 1257    PT Stop Time 1336    PT Time Calculation (min) 39 min    Activity Tolerance Patient tolerated treatment well    Behavior During Therapy WFL for tasks assessed/performed               Past Medical History:  Diagnosis Date   Allergy    Anemia    hx of    Anxiety    Arthritis    left knee, hands, back   Asthma    daily and prn inhalers   Atypical chest pain 11/26/2020   Basal cell carcinoma (BCC) 2019   Mohs on forehead early 2019   Brain aneurysm    Brainstem infarct, 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 since age 79   IBS (irritable bowel syndrome)    Long term current use of aromatase inhibitor 05/08/2012   Melanoma (HCC) 2019   excised from nose early 2019   Overactive bladder    Palpitations    Pneumonia    hx of x 2    PONV (postoperative nausea and vomiting)    PVD (peripheral vascular disease) (HCC)    varicose veins - left worse than right   Spinal stenosis    Stress incontinence    Stroke (HCC)    problem with expressive communicaiton   Traumatic hemopneumothorax  1982   left   Past Surgical History:  Procedure Laterality Date   ANKLE HARDWARE REMOVAL     right   BREAST BIOPSY  1976   right   BREAST BIOPSY Left 2019   benign lymph node   BREAST BIOPSY Right 08/10/2022   MM RT BREAST BX W LOC DEV 1ST LESION IMAGE BX SPEC STEREO GUIDE 08/10/2022 [facility]   BREAST BIOPSY  09/27/2022   MM RT RADIOACTIVE SEED LOC MAMMO GUIDE 09/27/2022 [facility]   BREAST LUMPECTOMY  2013   right with snbx   BREAST LUMPECTOMY WITH RADIOACTIVE SEED LOCALIZATION Right 09/28/2022   Procedure: RIGHT BREAST LUMPECTOMY WITH RADIOACTIVE SEED LOCALIZATION;  Surgeon: Harriette Bouillon, MD;  Location: [facility];  Service: General;  Laterality: Right;   CHOLECYSTECTOMY  1990s   COLONOSCOPY     CYSTOSCOPY  07/05/2005   KNEE SURGERY  1980s   left   KYPHOPLASTY N/A 06/15/2017   Procedure: KYPHOPLASTY T11;  Surgeon: Venita Lick, MD;  Location: [facility];  Service: Orthopedics;  Laterality: N/A;  90 mins   ORIF ANKLE FRACTURE  1980s   right    TOTAL KNEE ARTHROPLASTY Left 12/08/2015   Procedure: LEFT TOTAL KNEE ARTHROPLASTY;  Surgeon: Durene Romans, MD;  Location: [facility];  Service: Orthopedics;  Laterality: Left;   TRANSOBTURATOR SLING  07/05/2005   TUBAL LIGATION  1976   VEIN SURGERY     vein ablation left leg   Patient Active Problem List   Diagnosis Date Noted   Family history of breast cancer 08/18/2022   Genetic testing 08/17/2022   Malignant neoplasm of lower-outer quadrant of right breast of female, estrogen receptor positive (HCC) 08/16/2022   CAD in native artery 05/05/2022   Atypical chest pain 11/26/2020   S/P kyphoplasty 06/15/2017   S/P total knee replacement using cement 12/08/2015   Atrial fibrillation (HCC) 11/19/2015   Anxiety and depression 07/09/2015   Varicose veins of bilateral lower extremities with other complications 06/16/2015   Essential hypertension, benign 12/08/2014   Acute hemorrhagic cystitis 02/18/2014   Stroke  (HCC) 05/15/2012   Spinal stenosis    COPD (chronic obstructive pulmonary disease) (HCC)    Long term current use of aromatase inhibitor 05/08/2012   Hx of radiation therapy    Breast cancer of upper-inner quadrant of right female breast (HCC) 12/23/2011   Ataxia 06/23/2011   Contact dermatitis and eczema due to plant 01/04/2011   BACK PAIN 08/11/2009   PALPITATIONS, RECURRENT 02/27/2009   PERS HX TOBACCO USE PRESENTING HAZARDS HEALTH 12/17/2008   Hyperlipidemia 02/26/2008   ASTHMA 02/26/2008   GERD 02/12/2007    PCP: [name], NP  REFERRING PROVIDER: Huel Cote, MD  REFERRING DIAG: [phone] (ICD-10-CM) - Chronic bilateral thoracic back pain  Rationale for Evaluation and Treatment: Rehabilitation  THERAPY DIAG:  Pain in thoracic spine  Other low back pain  Muscle weakness (generalized)  Other abnormalities of gait and mobility  Other lack of coordination  ONSET DATE: exacerbation since July 2024, initial onset in 1980s  SUBJECTIVE:                                                                                                                                                                                           SUBJECTIVE STATEMENT: Pt indicated feeling trouble with driving in car.  Reported doing stuff outside and felt pain afterward.  Pt indicated feeling 8/10 this morning.  Reported doing HEP but reported doing on bed was better.    PERTINENT HISTORY:  stroke, HTN, afib, CAD, BIL varicose veins, COPD, ataxia, breast cancer s/p localized lumpectomy on 09/28/22, anxiety, prior kyphoplasty T11, hx DVT, incidental finding of brain  aneurysm, prior LLE MSK surgeries   PAIN:  Are you having pain: yes, 8/10 Location/description: mid back and low back, L sided only. Sometimes refers into upper back and neck - aggravating factors: standing, walking, housework, driving.  - Easing factors: external support (pillow), rest, medication, leaning forward, self  massage   PRECAUTIONS: hx stroke, cancer, aneurysm, DVT hx   WEIGHT BEARING RESTRICTIONS: No  FALLS:  Has patient fallen in last 6 months? Yes. Number of falls 1 in July 2024 - does endorse a lot of near falls. Has been using QC for several years  LIVING ENVIRONMENT: Lives alone Has QC (ambulates into clinic with) RW  OCCUPATION: retired Engineer, civil (consulting)  PLOF: Independent - prior to fall in July, using cane for several years  PATIENT GOALS: reduce pain, be able to do more activities  NEXT MD VISIT: TBD per pt, nothing yet scheduled  OBJECTIVE:   DIAGNOSTIC FINDINGS:  11/04/2022 XR Thoracic 10/17/22 (refer to [facility] for details) "IMPRESSION: 1. No acute abnormality. 2. Stable T11 compression deformity with kyphoplasty material. 3. Stable degenerative changes."  April 2024 Thoracic MRI (refer to [facility] for details) "IMPRESSION: 1. No acute findings or explanation for the patient's symptoms. 2. Stable chronic healed T11 compression deformity status post spinal augmentation. 3. Mild thoracic spondylosis without significant spinal stenosis or nerve root encroachment."  PATIENT SURVEYS:  11/04/2022 FOTO 34 current, 43 predicted  SCREENING FOR RED FLAGS: 11/04/2022 MRI as above - pt endorses chronic bilateral LE numbness, recent history of cancer. Continues to follow with PCP and oncology  COGNITION: 11/04/2022 Overall cognitive status: Within functional limits for tasks assessed  - chart review notes history of expressive aphasia although this is not overtly evident in conversation today   SENSATION: 11/04/2022 Endorses chronic history of BLE numbness posteriorly  POSTURE: 11/04/2022  noted kyphosis and forward head posture   PALPATION: 11/04/2022 Concordant tenderness superior QL on L side, thoracolumbar paraspinals  LUMBAR ROM:   AROM 11/04/2022 11/21/2022  Flexion Mid shin with pain in low back/hips   Extension NT 75 % WFL  Right lateral flexion    Left lateral flexion     Right rotation <25% *   Left rotation <25% *    (Blank rows = not tested) (Key: WFL = within functional limits not formally assessed, * = concordant pain, s = stiffness/stretching sensation, NT = not tested)   RANGE OF MOTION:     Active  Right 11/04/2022 Left 11/04/2022  Shoulder flexion    Shoulder abduction    Functional ER combo    Functional IR combo    Knee extension    Ankle dorsiflexion     (Blank rows = not tested) (Key: WFL = within functional limits not formally assessed, * = concordant pain, s = stiffness/stretching sensation, NT = not tested)  Comments:    STRENGTH TESTING:  MMT Right 11/04/2022 Left 11/04/2022  Shoulder flexion    Shoulder abduction    Elbow flexion    Elbow extension    Grip strength (gross)    Hip flexion 4 * 3+ *  Hip abduction (modified sitting) 5 5  Knee flexion 4 * 4 *  Knee extension 4 3+ *  Ankle dorsiflexion    Ankle plantarflexion     (Blank rows = not tested) (Key: WFL = within functional limits not formally assessed, * = concordant pain, s = stiffness/stretching sensation, NT = not tested)  Comments:    LUMBAR SPECIAL TESTS:  11/04/2022 deferred  FUNCTIONAL TESTS:  11/04/2022 TUG 8.57 sec QC 5xSTS: 9.57 sec with UE support   GAIT: 11/21/2022:  quad cane use but not sequencing for sharing WB pressure, noted to drag it or carry it at times.   11/07/2022 Distance walked: within clinic Assistive device utilized: Quad cane large base Level of assistance: Modified independence Comments: mild variability in BOS although no overt ataxia apparent, limited WB through QC which is utilized in L UE.                      TODAY'S TREATMENT:                                                                                            DATE: 11/21/2022 Therex: Nustep Lvl 5 8 mins UE/LE  Supine lumbar trunk rotation 15 sec x 3 bilateral Supine bridge 2 x 10 2-3 sec hold  Supine blue band isometric hold with clam shell x 20 bilateral Supine  red pball press down 5 sec hold x 10  Standing Green band rows x 20 bilateral Standing Green band GH ext x 20 bilaterally  Sit to stand to sit x 10 (sit down slowly) - 18 inch chair x10  TODAY'S TREATMENT:                                                                                            DATE: 11/07/2022 Therex: Nustep Lvl 5 10 mins UE/LE  Supine lumbar trunk rotation 15 sec x 5 bilateral Supine bridge 2 x 10 2-3 sec hold  Supine blue band isometric hold with clam shell x 20 bilateral Standing red band rows x 20 bilateral Standing red band GH ext x 20 bilaterally     TODAY'S TREATMENT:                                                                                            DATE: 11/04/22 Deferred given time constraints     PATIENT EDUCATION:  Education details: HEP established Person educated: Patient Education method: Explanation, Demonstration, Tactile cues, Verbal cues Education comprehension: verbalized understanding, returned demonstration, verbal cues required, tactile cues required, and needs further education    HOME EXERCISE PROGRAM: Access Code: [email or web address] URL: [email or web address] Date: 11/07/2022 Prepared by: Chyrel Masson  Exercises - Supine Lower Trunk Rotation  - 2-3 x daily - 7 x weekly - 1 sets - 3-5 reps - 15  hold - Supine Bridge  - 1-2 x daily - 7 x weekly - 1-2 sets - 10 reps - 2 hold - Clamshell  - 1-2 x daily - 7 x weekly - 3 sets - 10 reps - Seated Scapular Retraction  - 2-3 x daily - 7 x weekly - 1 sets - 10 reps - 3-5 hold - Cervical Retraction at Wall  - 2-3 x daily - 7 x weekly - 1 sets - 10 reps - 5 hold   ASSESSMENT:  CLINICAL IMPRESSION: Continued review of HEP to ensure good techniques.  Progressive strengthening for LE to help improve daily activity tolerance.  Continued skilled PT services warranted at this time.    OBJECTIVE IMPAIRMENTS: Abnormal gait, decreased activity tolerance, decreased balance, decreased  endurance, decreased mobility, difficulty walking, decreased ROM, decreased strength, improper body mechanics, postural dysfunction, and pain.   ACTIVITY LIMITATIONS: carrying, lifting, bending, sitting, standing, squatting, transfers, and locomotion level  PARTICIPATION LIMITATIONS: meal prep, cleaning, laundry, community activity, and yard work  PERSONAL FACTORS: Age, Time since onset of injury/illness/exacerbation, and 3+ comorbidities: cancer, anxiety, COPD, stroke, Afib, HTN, prior kyphoplasty, hx DVT, aneurysm  are also affecting patient's functional outcome.   REHAB POTENTIAL: Fair given chronicity and comorbidities  CLINICAL DECISION MAKING: Evolving/moderate complexity  EVALUATION COMPLEXITY: Moderate   GOALS: Goals reviewed with patient? Yes  SHORT TERM GOALS: Target date: 12/02/2022 Pt will demonstrate appropriate understanding and performance of initially prescribed HEP in order to facilitate improved independence with management of symptoms.   Goal status: on going 11/07/2022   2. Pt will score greater than or equal to 38 on FOTO in order to demonstrate improved perception of function due to symptoms.   Goal status: on going 11/07/2022    LONG TERM GOALS: Target date: 12/30/2022 Pt will score 43 on FOTO in order to demonstrate improved perception of functional status due to symptoms.  Baseline: 34 Goal status: INITIAL  2.  Pt will demonstrate at least 75% and painless lumbar rotation AROM bilaterally in order to demonstrate improved tolerance to functional movement patterns. Baseline: see ROM chart above Goal status: INITIAL  3.  Pt will demonstrate grossly symmetrical MMT of bilateral hips and knees in order to demonstrate improved strength for functional movements.  Baseline: see MMT chart above Goal status: INITIAL  4. Pt will demonstrate appropriate performance of final prescribed HEP in order to facilitate improved self-management of symptoms post-discharge.    Baseline: initial HEP TBD  Goal status: INITIAL    5. Pt will report at least 50% decrease in overall pain levels in past week in order to facilitate improved tolerance to basic ADLs/mobility.   Baseline: 6-11/10  Goal status: INITIAL    6. Pt will endorse/demonstrate ability to perform typical household tasks such as cleaning and vacuuming with less than 3 pt increase in resting pain in order to facilitate improved tolerance to ADLs.  Baseline: reports having to use cleaning service since July  Goal status: INITIAL    PLAN:  PT FREQUENCY: 2x/week  PT DURATION: 8 weeks  PLANNED INTERVENTIONS: Therapeutic exercises, Therapeutic activity, Neuromuscular re-education, Balance training, Gait training, Patient/Family education, Self Care, Joint mobilization, Stair training, Dry Needling, Spinal mobilization, Cryotherapy, Taping, Manual therapy, and Re-evaluation.  PLAN FOR NEXT SESSION: Progressive activity tolerance improvements/general fitness.    recent cancer hx and lumpectomy.   Chyrel Masson, PT, DPT, OCS, ATC 11/21/22  1:44 PM

## 2022-11-23 ENCOUNTER — Ambulatory Visit (INDEPENDENT_AMBULATORY_CARE_PROVIDER_SITE_OTHER): Payer: Medicare Other | Admitting: Rehabilitative and Restorative Service Providers"

## 2022-11-23 ENCOUNTER — Encounter: Payer: Self-pay | Admitting: Rehabilitative and Restorative Service Providers"

## 2022-11-23 DIAGNOSIS — M546 Pain in thoracic spine: Secondary | ICD-10-CM | POA: Diagnosis not present

## 2022-11-23 DIAGNOSIS — M5459 Other low back pain: Secondary | ICD-10-CM

## 2022-11-23 DIAGNOSIS — M6281 Muscle weakness (generalized): Secondary | ICD-10-CM | POA: Diagnosis not present

## 2022-11-23 NOTE — Therapy (Signed)
OUTPATIENT PHYSICAL THERAPY TREATMENT   Patient Name: Alexandria [name] MRN: [id number] DOB:08/24/1940, 82 y.o., female Today's Date: 11/23/2022  END OF SESSION:  PT End of Session - 11/23/22 1259     Visit Number 4    Number of Visits 17    Date for PT Re-Evaluation 12/30/22    Authorization Type [employer] medicare    Progress Note Due on Visit 10    PT Start Time 1259    PT Stop Time 1338    PT Time Calculation (min) 39 min    Activity Tolerance Other (comment)   limited by Rt breast reports   Behavior During Therapy 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 since age 44   IBS (irritable bowel syndrome)    Long term current use of aromatase inhibitor 05/08/2012   Melanoma (HCC) 2019   excised from nose early 2019   Overactive bladder    Palpitations    Pneumonia    hx of x 2    PONV (postoperative nausea and vomiting)    PVD (peripheral vascular disease) (HCC)    varicose veins - left worse than right   Spinal stenosis    Stress incontinence    Stroke (HCC)    problem with expressive communicaiton   Traumatic  hemopneumothorax 1982   left   Past Surgical History:  Procedure Laterality Date   ANKLE HARDWARE REMOVAL     right   BREAST BIOPSY  1976   right   BREAST BIOPSY Left 2019   benign lymph node   BREAST BIOPSY Right 08/10/2022   MM RT BREAST BX W LOC DEV 1ST LESION IMAGE BX SPEC STEREO GUIDE 08/10/2022 [facility]   BREAST BIOPSY  09/27/2022   MM RT RADIOACTIVE SEED LOC MAMMO GUIDE 09/27/2022 [facility]   BREAST LUMPECTOMY  2013   right with snbx   BREAST LUMPECTOMY WITH RADIOACTIVE SEED LOCALIZATION Right 09/28/2022   Procedure: RIGHT BREAST LUMPECTOMY WITH RADIOACTIVE SEED LOCALIZATION;  Surgeon: Harriette Bouillon, MD;  Location: [facility];  Service: General;  Laterality: Right;   CHOLECYSTECTOMY  1990s   COLONOSCOPY     CYSTOSCOPY  07/05/2005   KNEE SURGERY  1980s   left   KYPHOPLASTY N/A 06/15/2017   Procedure: KYPHOPLASTY T11;  Surgeon: Venita Lick,  MD;  Location: [facility];  Service: Orthopedics;  Laterality: N/A;  90 mins   ORIF ANKLE FRACTURE  1980s   right    TOTAL KNEE ARTHROPLASTY Left 12/08/2015   Procedure: LEFT TOTAL KNEE ARTHROPLASTY;  Surgeon: Durene Romans, MD;  Location: [facility];  Service: Orthopedics;  Laterality: Left;   TRANSOBTURATOR SLING  07/05/2005   TUBAL LIGATION  1976   VEIN SURGERY     vein ablation left leg   Patient Active Problem List   Diagnosis Date Noted   Family history of breast cancer 08/18/2022   Genetic testing 08/17/2022   Malignant neoplasm of lower-outer quadrant of right breast of female, estrogen receptor positive (HCC) 08/16/2022   CAD in native artery 05/05/2022   Atypical chest pain 11/26/2020   S/P kyphoplasty 06/15/2017   S/P total knee replacement using cement 12/08/2015   Atrial fibrillation (HCC) 11/19/2015   Anxiety and depression 07/09/2015   Varicose veins of bilateral lower extremities with other complications 06/16/2015   Essential hypertension, benign 12/08/2014   Acute hemorrhagic cystitis  02/18/2014   Stroke (HCC) 05/15/2012   Spinal stenosis    COPD (chronic obstructive pulmonary disease) (HCC)    Long term current use of aromatase inhibitor 05/08/2012   Hx of radiation therapy    Breast cancer of upper-inner quadrant of right female breast (HCC) 12/23/2011   Ataxia 06/23/2011   Contact dermatitis and eczema due to plant 01/04/2011   BACK PAIN 08/11/2009   PALPITATIONS, RECURRENT 02/27/2009   PERS HX TOBACCO USE PRESENTING HAZARDS HEALTH 12/17/2008   Hyperlipidemia 02/26/2008   ASTHMA 02/26/2008   GERD 02/12/2007    PCP: [name], NP  REFERRING PROVIDER: Huel Cote, MD  REFERRING DIAG: [phone] (ICD-10-CM) - Chronic bilateral thoracic back pain  Rationale for Evaluation and Treatment: Rehabilitation  THERAPY DIAG:  Pain in thoracic spine  Other low back pain  Muscle weakness (generalized)  ONSET DATE: exacerbation since July 2024, initial onset in 1980s  SUBJECTIVE:                                                                                                                                                                                           SUBJECTIVE STATEMENT: Pt indicated feeling complaints of burning on Lt side of back from shoulder to belt line, noted end of day of last visit.  Reported doing activity outside that day as well as the clinic visit.  Reported difficulty c sleeping with that complaint.  Pt also mentioned having possible hematoma related to Rt breast that she was going to check with MD about. Arrived with arm across pillow on chest for comfort.  PERTINENT HISTORY:  stroke, HTN, afib, CAD, BIL varicose veins, COPD, ataxia, breast cancer s/p localized lumpectomy on 09/28/22, anxiety, prior kyphoplasty T11, hx DVT, incidental finding of brain aneurysm, prior LLE MSK surgeries   PAIN:  Are you having pain: no pain upon arrival.  At worst in last few days : severe "11/10"  Location/description: mid back and low back,  L sided only.  - aggravating factors: standing, walking, housework, driving.  - Easing factors: external support (pillow), rest, medication, leaning forward, self massage   PRECAUTIONS: hx stroke, cancer, aneurysm, DVT hx   WEIGHT BEARING RESTRICTIONS: No  FALLS:  Has patient fallen in last 6 months? Yes. Number of falls 1 in July 2024 - does endorse a lot of near falls. Has been using QC for several years  LIVING ENVIRONMENT: Lives alone Has QC (ambulates into clinic with) RW  OCCUPATION: retired Engineer, civil (consulting)  PLOF: Independent - prior to fall in July, using cane for several years  PATIENT GOALS: reduce pain, be able to do more activities  NEXT MD VISIT: TBD per pt, nothing yet scheduled  OBJECTIVE:   DIAGNOSTIC FINDINGS:  11/04/2022 XR Thoracic 10/17/22 (refer to [facility] for details) "IMPRESSION: 1. No acute abnormality. 2. Stable T11 compression deformity with kyphoplasty material. 3. Stable degenerative changes."  April 2024 Thoracic MRI (refer to [facility] for details) "IMPRESSION: 1. No acute findings or explanation for the patient's symptoms. 2. Stable chronic healed T11 compression deformity status post spinal augmentation. 3. Mild thoracic spondylosis without significant spinal stenosis or nerve root encroachment."  PATIENT SURVEYS:  11/04/2022 FOTO 34 current, 43 predicted  SCREENING FOR RED FLAGS: 11/04/2022 MRI as above - pt endorses chronic bilateral LE numbness, recent history of cancer. Continues to follow with PCP and oncology  COGNITION: 11/04/2022 Overall cognitive status: Within functional limits for tasks assessed  - chart review notes history of expressive aphasia although this is not overtly evident in conversation today   SENSATION: 11/04/2022 Endorses chronic history of BLE numbness posteriorly  POSTURE: 11/04/2022  noted kyphosis and forward head posture   PALPATION: 11/04/2022 Concordant tenderness superior QL on L side, thoracolumbar  paraspinals  LUMBAR ROM:   AROM 11/04/2022 11/21/2022  Flexion Mid shin with pain in low back/hips   Extension NT 75 % WFL  Right lateral flexion    Left lateral flexion    Right rotation <25% *   Left rotation <25% *    (Blank rows = not tested) (Key: WFL = within functional limits not formally assessed, * = concordant pain, s = stiffness/stretching sensation, NT = not tested)   RANGE OF MOTION:     Active  Right 11/04/2022 Left 11/04/2022  Shoulder flexion    Shoulder abduction    Functional ER combo    Functional IR combo    Knee extension    Ankle dorsiflexion     (Blank rows = not tested) (Key: WFL = within functional limits not formally assessed, * = concordant pain, s = stiffness/stretching sensation, NT = not tested)  Comments:    STRENGTH TESTING:  MMT Right 11/04/2022 Left 11/04/2022  Shoulder flexion    Shoulder abduction    Elbow flexion    Elbow extension    Grip strength (gross)    Hip flexion 4 * 3+ *  Hip abduction (modified sitting) 5 5  Knee flexion 4 * 4 *  Knee extension 4 3+ *  Ankle dorsiflexion    Ankle plantarflexion     (Blank rows = not tested) (  Key: WFL = within functional limits not formally assessed, * = concordant pain, s = stiffness/stretching sensation, NT = not tested)  Comments:    LUMBAR SPECIAL TESTS:  11/04/2022 deferred  FUNCTIONAL TESTS:  11/04/2022 TUG 8.57 sec QC 5xSTS: 9.57 sec with UE support   GAIT: 11/21/2022:  quad cane use but not sequencing for sharing WB pressure, noted to drag it or carry it at times.   11/07/2022 Distance walked: within clinic Assistive device utilized: Quad cane large base Level of assistance: Modified independence Comments: mild variability in BOS although no overt ataxia apparent, limited WB through QC which is utilized in L UE.                      TODAY'S TREATMENT:                                                                                            DATE: 11/23/2022 Therex: Supine  lumbar trunk rotation 15 sec x 5 bilateral Supine bridge x18 5sec hold  Supine marching c TrA setting 2 x 10  Supine blue band isometric hold with clam shell x 20 bilateral Single knee to chest 15 sec hold x 3 bilateral  Nustep Lvl 5 LE only 8 mins   TODAY'S TREATMENT:                                                                                            DATE: 11/21/2022 Therex: Nustep Lvl 5 8 mins UE/LE  Supine lumbar trunk rotation 15 sec x 3 bilateral Supine bridge 2 x 10 2-3 sec hold  Supine blue band isometric hold with clam shell x 20 bilateral Supine red pball press down 5 sec hold x 10  Standing Green band rows x 20 bilateral Standing Green band GH ext x 20 bilaterally  Sit to stand to sit x 10 (sit down slowly) - 18 inch chair x10  TODAY'S TREATMENT:                                                                                            DATE: 11/07/2022 Therex: Nustep Lvl 5 10 mins UE/LE  Supine lumbar trunk rotation 15 sec x 5 bilateral Supine bridge 2 x 10 2-3 sec hold  Supine blue band isometric hold with clam shell x 20 bilateral Standing red band rows x 20 bilateral Standing red band  GH ext x 20 bilaterally     TODAY'S TREATMENT:                                                                                            DATE: 11/04/22 Deferred given time constraints     PATIENT EDUCATION:  11/23/2022 Education details: HEP updates Person educated: Patient Education method: Explanation, Demonstration, Tactile cues, Verbal cues Education comprehension: verbalized understanding, returned demonstration, verbal cues required, tactile cues required, and needs further education    HOME EXERCISE PROGRAM: Access Code: [email or web address] URL: [email or web address] Date: 11/23/2022 Prepared by: Chyrel Masson  Exercises - Supine Lower Trunk Rotation  - 2-3 x daily - 7 x weekly - 1 sets - 3-5 reps - 15 hold - Supine Bridge  - 1-2 x daily - 7 x weekly - 1-2 sets  - 10 reps - 2 hold - Clamshell  - 1-2 x daily - 7 x weekly - 3 sets - 10 reps - Seated Scapular Retraction  - 2-3 x daily - 7 x weekly - 1 sets - 10 reps - 3-5 hold - Cervical Retraction at Wall  - 2-3 x daily - 7 x weekly - 1 sets - 10 reps - 5 hold - Supine Single Knee to Chest Stretch  - 2 x daily - 7 x weekly - 1 sets - 5 reps - 15 hold   ASSESSMENT:  CLINICAL IMPRESSION: Adjusted intervention today to accommodate breast/arm related symptom presentation.  Continued to show improved lumbar mobility within clinic with improving tolerance to general activity.  Plan to reassess mobility next visit and follow up on any changes related to post surgical area in breast.    OBJECTIVE IMPAIRMENTS: Abnormal gait, decreased activity tolerance, decreased balance, decreased endurance, decreased mobility, difficulty walking, decreased ROM, decreased strength, improper body mechanics, postural dysfunction, and pain.   ACTIVITY LIMITATIONS: carrying, lifting, bending, sitting, standing, squatting, transfers, and locomotion level  PARTICIPATION LIMITATIONS: meal prep, cleaning, laundry, community activity, and yard work  PERSONAL FACTORS: Age, Time since onset of injury/illness/exacerbation, and 3+ comorbidities: cancer, anxiety, COPD, stroke, Afib, HTN, prior kyphoplasty, hx DVT, aneurysm  are also affecting patient's functional outcome.   REHAB POTENTIAL: Fair given chronicity and comorbidities  CLINICAL DECISION MAKING: Evolving/moderate complexity  EVALUATION COMPLEXITY: Moderate   GOALS: Goals reviewed with patient? Yes  SHORT TERM GOALS: Target date: 12/02/2022 Pt will demonstrate appropriate understanding and performance of initially prescribed HEP in order to facilitate improved independence with management of symptoms.   Goal status: on going 11/07/2022   2. Pt will score greater than or equal to 38 on FOTO in order to demonstrate improved perception of function due to symptoms.   Goal  status: on going 11/07/2022    LONG TERM GOALS: Target date: 12/30/2022 Pt will score 43 on FOTO in order to demonstrate improved perception of functional status due to symptoms.  Baseline: 34 Goal status: INITIAL  2.  Pt will demonstrate at least 75% and painless lumbar rotation AROM bilaterally in order to demonstrate improved tolerance to functional movement patterns. Baseline: see ROM chart above Goal status: INITIAL  3.  Pt will demonstrate  grossly symmetrical MMT of bilateral hips and knees in order to demonstrate improved strength for functional movements.  Baseline: see MMT chart above Goal status: INITIAL  4. Pt will demonstrate appropriate performance of final prescribed HEP in order to facilitate improved self-management of symptoms post-discharge.   Baseline: initial HEP TBD  Goal status: INITIAL    5. Pt will report at least 50% decrease in overall pain levels in past week in order to facilitate improved tolerance to basic ADLs/mobility.   Baseline: 6-11/10  Goal status: INITIAL    6. Pt will endorse/demonstrate ability to perform typical household tasks such as cleaning and vacuuming with less than 3 pt increase in resting pain in order to facilitate improved tolerance to ADLs.  Baseline: reports having to use cleaning service since July  Goal status: INITIAL    PLAN:  PT FREQUENCY: 2x/week  PT DURATION: 8 weeks  PLANNED INTERVENTIONS: Therapeutic exercises, Therapeutic activity, Neuromuscular re-education, Balance training, Gait training, Patient/Family education, Self Care, Joint mobilization, Stair training, Dry Needling, Spinal mobilization, Cryotherapy, Taping, Manual therapy, and Re-evaluation.  PLAN FOR NEXT SESSION: Reassess symptoms in breast area and adapt intervention accordingly.    recent cancer hx and lumpectomy.   Chyrel Masson, PT, DPT, OCS, ATC 11/23/22  1:35 PM

## 2022-11-28 ENCOUNTER — Ambulatory Visit (INDEPENDENT_AMBULATORY_CARE_PROVIDER_SITE_OTHER): Payer: Medicare Other | Admitting: Rehabilitative and Restorative Service Providers"

## 2022-11-28 ENCOUNTER — Encounter: Payer: Self-pay | Admitting: Rehabilitative and Restorative Service Providers"

## 2022-11-28 DIAGNOSIS — M546 Pain in thoracic spine: Secondary | ICD-10-CM | POA: Diagnosis not present

## 2022-11-28 DIAGNOSIS — M6281 Muscle weakness (generalized): Secondary | ICD-10-CM | POA: Diagnosis not present

## 2022-11-28 DIAGNOSIS — M5459 Other low back pain: Secondary | ICD-10-CM | POA: Diagnosis not present

## 2022-11-28 NOTE — Therapy (Addendum)
OUTPATIENT PHYSICAL THERAPY TREATMENT + NO VISIT DISCHARGE SUMMARY (see below)    Patient Name: Alexandria [name] MRN: [id number] DOB:[birth year], 82 y.o., female Today's Date: 11/28/2022  END OF SESSION:  PT End of Session - 11/28/22 1258     Visit Number 5    Number of Visits 17    Date for PT Re-Evaluation 12/30/22    Authorization Type [employer] medicare    Progress Note Due on Visit 10    PT Start Time 1258    PT Stop Time 1337    PT Time Calculation (min) 39 min    Activity Tolerance Other (comment)   limited by Rt breast reports   Behavior During Therapy [facility] for tasks assessed/performed                 Past Medical History:  Diagnosis Date   Allergy    Anemia    hx of    Anxiety    Arthritis    left knee, hands, back   Asthma    daily and prn inhalers   Atypical chest pain 11/26/2020   Basal cell carcinoma (BCC) 2019   Mohs on forehead early 2019   Brain aneurysm    Brainstem infarct, 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 since age 43   IBS (irritable bowel syndrome)    Long term current use of aromatase inhibitor 05/08/2012   Melanoma (HCC) 2019   excised from nose early 2019   Overactive bladder    Palpitations    Pneumonia    hx of x 2    PONV (postoperative nausea and vomiting)    PVD (peripheral vascular disease) (HCC)    varicose veins - left worse than right   Spinal stenosis    Stress incontinence    Stroke (HCC)    problem  with expressive communicaiton   Traumatic hemopneumothorax 1982   left   Past Surgical History:  Procedure Laterality Date   ANKLE HARDWARE REMOVAL     right   BREAST BIOPSY  1976   right   BREAST BIOPSY Left 2019   benign lymph node   BREAST BIOPSY Right 08/10/2022   MM RT BREAST BX W LOC DEV 1ST LESION IMAGE BX SPEC STEREO GUIDE 08/10/2022 [facility]   BREAST BIOPSY  09/27/2022   MM RT RADIOACTIVE SEED LOC MAMMO GUIDE 09/27/2022 [facility]   BREAST LUMPECTOMY  2013   right with snbx   BREAST LUMPECTOMY WITH RADIOACTIVE SEED LOCALIZATION Right 09/28/2022   Procedure: RIGHT BREAST LUMPECTOMY WITH RADIOACTIVE SEED LOCALIZATION;  Surgeon: Harriette Bouillon, MD;  Location: [facility];  Service: General;  Laterality: Right;   CHOLECYSTECTOMY  1990s   COLONOSCOPY     CYSTOSCOPY  07/05/2005   KNEE SURGERY  1980s   left   KYPHOPLASTY N/A 06/15/2017  Procedure: KYPHOPLASTY T11;  Surgeon: Venita Lick, MD;  Location: [facility];  Service: Orthopedics;  Laterality: N/A;  90 mins   ORIF ANKLE FRACTURE  1980s   right    TOTAL KNEE ARTHROPLASTY Left 12/08/2015   Procedure: LEFT TOTAL KNEE ARTHROPLASTY;  Surgeon: Durene Romans, MD;  Location: [facility];  Service: Orthopedics;  Laterality: Left;   TRANSOBTURATOR SLING  07/05/2005   TUBAL LIGATION  1976   VEIN SURGERY     vein ablation left leg   Patient Active Problem List   Diagnosis Date Noted   Family history of breast cancer 08/18/2022   Genetic testing 08/17/2022   Malignant neoplasm of lower-outer quadrant of right breast of female, estrogen receptor positive (HCC) 08/16/2022   CAD in native artery 05/05/2022   Atypical chest pain 11/26/2020   S/P kyphoplasty 06/15/2017   S/P total knee replacement using cement 12/08/2015   Atrial fibrillation (HCC) 11/19/2015   Anxiety and depression 07/09/2015   Varicose veins of bilateral lower extremities with other complications 06/16/2015   Essential hypertension, benign  12/08/2014   Acute hemorrhagic cystitis 02/18/2014   Stroke (HCC) 05/15/2012   Spinal stenosis    COPD (chronic obstructive pulmonary disease) (HCC)    Long term current use of aromatase inhibitor 05/08/2012   Hx of radiation therapy    Breast cancer of upper-inner quadrant of right female breast (HCC) 12/23/2011   Ataxia 06/23/2011   Contact dermatitis and eczema due to plant 01/04/2011   BACK PAIN 08/11/2009   PALPITATIONS, RECURRENT 02/27/2009   PERS HX TOBACCO USE PRESENTING HAZARDS HEALTH 12/17/2008   Hyperlipidemia 02/26/2008   ASTHMA 02/26/2008   GERD 02/12/2007    PCP: [name], NP  REFERRING PROVIDER: Huel Cote, MD  REFERRING DIAG: [phone] (ICD-10-CM) - Chronic bilateral thoracic back pain  Rationale for Evaluation and Treatment: Rehabilitation  THERAPY DIAG:  Pain in thoracic spine  Other low back pain  Muscle weakness (generalized)  ONSET DATE: exacerbation since July 2024, initial onset in 1980s  SUBJECTIVE:                                                                                                                                                                                           SUBJECTIVE STATEMENT: Pt indicated not sleeping well due to burning complaints in back.  Pt indicated she can go most of the day with less complaints but then noted at bed.   Pt indicated she has a visit about breast after today's visit.    PERTINENT HISTORY:  stroke, HTN, afib, CAD, BIL varicose veins, COPD, ataxia, breast cancer s/p localized lumpectomy on 09/28/22, anxiety, prior  kyphoplasty T11, hx DVT, incidental finding of brain aneurysm, prior LLE MSK surgeries   PAIN:  Are you having pain?   No pain  Location/description: mid back and low back, Lt sided only.  - aggravating factors: standing, walking, housework, driving.  - Easing factors: external support (pillow), rest, medication, leaning forward, self massage   PRECAUTIONS: hx stroke,  cancer, aneurysm, DVT hx   WEIGHT BEARING RESTRICTIONS: No  FALLS:  Has patient fallen in last 6 months? Yes. Number of falls 1 in July 2024 - does endorse a lot of near falls. Has been using QC for several years  LIVING ENVIRONMENT: Lives alone Has QC (ambulates into clinic with) RW  OCCUPATION: retired Engineer, civil (consulting)  PLOF: Independent - prior to fall in July, using cane for several years  PATIENT GOALS: reduce pain, be able to do more activities  NEXT MD VISIT: TBD per pt, nothing yet scheduled  OBJECTIVE:   DIAGNOSTIC FINDINGS:  11/04/2022 XR Thoracic 10/17/22 (refer to [facility] for details) "IMPRESSION: 1. No acute abnormality. 2. Stable T11 compression deformity with kyphoplasty material. 3. Stable degenerative changes."  April 2024 Thoracic MRI (refer to [facility] for details) "IMPRESSION: 1. No acute findings or explanation for the patient's symptoms. 2. Stable chronic healed T11 compression deformity status post spinal augmentation. 3. Mild thoracic spondylosis without significant spinal stenosis or nerve root encroachment."  PATIENT SURVEYS:  11/04/2022 FOTO 34 current, 43 predicted  SCREENING FOR RED FLAGS: 11/04/2022 MRI as above - pt endorses chronic bilateral LE numbness, recent history of cancer. Continues to follow with PCP and oncology  COGNITION: 11/04/2022 Overall cognitive status: Within functional limits for tasks assessed  - chart review notes history of expressive aphasia although this is not overtly evident in conversation today   SENSATION: 11/04/2022 Endorses chronic history of BLE numbness posteriorly  POSTURE: 11/04/2022  noted kyphosis and forward head posture   PALPATION: 11/04/2022 Concordant tenderness superior QL on L side, thoracolumbar paraspinals  LUMBAR ROM:   AROM 11/04/2022 11/21/2022  Flexion Mid shin with pain in low back/hips   Extension NT 75 % WFL  Right lateral flexion    Left lateral flexion    Right rotation <25% *   Left rotation  <25% *    (Blank rows = not tested) (Key: WFL = within functional limits not formally assessed, * = concordant pain, s = stiffness/stretching sensation, NT = not tested)   RANGE OF MOTION:     Active  Right 11/04/2022 Left 11/04/2022  Shoulder flexion    Shoulder abduction    Functional ER combo    Functional IR combo    Knee extension    Ankle dorsiflexion     (Blank rows = not tested) (Key: WFL = within functional limits not formally assessed, * = concordant pain, s = stiffness/stretching sensation, NT = not tested)  Comments:    STRENGTH TESTING:  MMT Right 11/04/2022 Left 11/04/2022  Shoulder flexion    Shoulder abduction    Elbow flexion    Elbow extension    Grip strength (gross)    Hip flexion 4 * 3+ *  Hip abduction (modified sitting) 5 5  Knee flexion 4 * 4 *  Knee extension 4 3+ *  Ankle dorsiflexion    Ankle plantarflexion     (Blank rows = not tested) (Key: WFL = within functional limits not formally assessed, * = concordant pain, s = stiffness/stretching sensation, NT = not tested)  Comments:    LUMBAR SPECIAL TESTS:  11/04/2022  deferred  FUNCTIONAL TESTS:  11/04/2022 TUG 8.57 sec QC 5xSTS: 9.57 sec with UE support   GAIT: 11/21/2022:  quad cane use but not sequencing for sharing WB pressure, noted to drag it or carry it at times.   11/07/2022 Distance walked: within clinic Assistive device utilized: Quad cane large base Level of assistance: Modified independence Comments: mild variability in BOS although no overt ataxia apparent, limited WB through QC which is utilized in L UE.                      TODAY'S TREATMENT:                                                                                            DATE: 11/28/2022 Therex: Nustep Lvl 5 LE only 10 mins Tband rows green band 2 x 15 bilateral Tband gh ext 2 x 15 bilateral Supine bridge x15 5sec hold  Supine red pball press down Lt UE only 5 sec hold x 15 Supine lumbar trunk rotation 15 sec x  5 bilateral Supine blue band isometric hold with clam shell 2x 15 bilateral Single knee to chest 15 sec hold x 5 bilateral    TODAY'S TREATMENT:                                                                                            DATE: 11/23/2022 Therex: Supine lumbar trunk rotation 15 sec x 5 bilateral Supine bridge x18 5sec hold  Supine marching c TrA setting 2 x 10  Supine blue band isometric hold with clam shell x 20 bilateral Single knee to chest 15 sec hold x 3 bilateral  Nustep Lvl 5 LE only 8 mins   TODAY'S TREATMENT:                                                                                            DATE: 11/21/2022 Therex: Nustep Lvl 5 8 mins UE/LE  Supine lumbar trunk rotation 15 sec x 3 bilateral Supine bridge 2 x 10 2-3 sec hold  Supine blue band isometric hold with clam shell x 20 bilateral Supine red pball press down 5 sec hold x 10  Standing Green band rows x 20 bilateral Standing Green band GH ext x 20 bilaterally  Sit to stand to sit x 10 (sit down slowly) - 18 inch chair x10  TODAY'S TREATMENT:  DATE: 11/07/2022 Therex: Nustep Lvl 5 10 mins UE/LE  Supine lumbar trunk rotation 15 sec x 5 bilateral Supine bridge 2 x 10 2-3 sec hold  Supine blue band isometric hold with clam shell x 20 bilateral Standing red band rows x 20 bilateral Standing red band GH ext x 20 bilaterally      PATIENT EDUCATION:  11/23/2022 Education details: HEP updates Person educated: Patient Education method: Explanation, Demonstration, Tactile cues, Verbal cues Education comprehension: verbalized understanding, returned demonstration, verbal cues required, tactile cues required, and needs further education    HOME EXERCISE PROGRAM: Access Code: [email or web address] URL: [email or web address] Date: 11/23/2022 Prepared by: Chyrel Masson  Exercises - Supine Lower Trunk Rotation   - 2-3 x daily - 7 x weekly - 1 sets - 3-5 reps - 15 hold - Supine Bridge  - 1-2 x daily - 7 x weekly - 1-2 sets - 10 reps - 2 hold - Clamshell  - 1-2 x daily - 7 x weekly - 3 sets - 10 reps - Seated Scapular Retraction  - 2-3 x daily - 7 x weekly - 1 sets - 10 reps - 3-5 hold - Cervical Retraction at Wall  - 2-3 x daily - 7 x weekly - 1 sets - 10 reps - 5 hold - Supine Single Knee to Chest Stretch  - 2 x daily - 7 x weekly - 1 sets - 5 reps - 15 hold   ASSESSMENT:  CLINICAL IMPRESSION: Pt was willing and desired to do some exercise that used arms without complaints noted in clinic.  Pt had reported reduced severity of symptoms during day activity but trouble noted at night.  No adverse reactions for back or Rt breast noted in activity within clinic today.  Pt to see MD about breast today after physical therapy visit.  Pt may continue to benefit from progressive strengthening for improved performance/tolerance in daily activity.    OBJECTIVE IMPAIRMENTS: Abnormal gait, decreased activity tolerance, decreased balance, decreased endurance, decreased mobility, difficulty walking, decreased ROM, decreased strength, improper body mechanics, postural dysfunction, and pain.   ACTIVITY LIMITATIONS: carrying, lifting, bending, sitting, standing, squatting, transfers, and locomotion level  PARTICIPATION LIMITATIONS: meal prep, cleaning, laundry, community activity, and yard work  PERSONAL FACTORS: Age, Time since onset of injury/illness/exacerbation, and 3+ comorbidities: cancer, anxiety, COPD, stroke, Afib, HTN, prior kyphoplasty, hx DVT, aneurysm  are also affecting patient's functional outcome.   REHAB POTENTIAL: Fair given chronicity and comorbidities  CLINICAL DECISION MAKING: Evolving/moderate complexity  EVALUATION COMPLEXITY: Moderate   GOALS: Goals reviewed with patient? Yes  SHORT TERM GOALS: Target date: 12/02/2022 Pt will demonstrate appropriate understanding and performance of  initially prescribed HEP in order to facilitate improved independence with management of symptoms.   Goal status:Met 11/28/2022  2. Pt will score greater than or equal to 38 on FOTO in order to demonstrate improved perception of function due to symptoms.   Goal status: on going 11/07/2022    LONG TERM GOALS: Target date: 12/30/2022 Pt will score 43 on FOTO in order to demonstrate improved perception of functional status due to symptoms.  Baseline: 34 Goal status: INITIAL  2.  Pt will demonstrate at least 75% and painless lumbar rotation AROM bilaterally in order to demonstrate improved tolerance to functional movement patterns. Baseline: see ROM chart above Goal status: INITIAL  3.  Pt will demonstrate grossly symmetrical MMT of bilateral hips and knees in order to demonstrate improved strength for functional movements.  Baseline: see MMT chart above Goal status:  INITIAL  4. Pt will demonstrate appropriate performance of final prescribed HEP in order to facilitate improved self-management of symptoms post-discharge.   Baseline: initial HEP TBD  Goal status: INITIAL    5. Pt will report at least 50% decrease in overall pain levels in past week in order to facilitate improved tolerance to basic ADLs/mobility.   Baseline: 6-11/10  Goal status: INITIAL    6. Pt will endorse/demonstrate ability to perform typical household tasks such as cleaning and vacuuming with less than 3 pt increase in resting pain in order to facilitate improved tolerance to ADLs.  Baseline: reports having to use cleaning service since July  Goal status: INITIAL    PLAN:  PT FREQUENCY: 2x/week  PT DURATION: 8 weeks  PLANNED INTERVENTIONS: Therapeutic exercises, Therapeutic activity, Neuromuscular re-education, Balance training, Gait training, Patient/Family education, Self Care, Joint mobilization, Stair training, Dry Needling, Spinal mobilization, Cryotherapy, Taping, Manual therapy, and Re-evaluation.  PLAN  FOR NEXT SESSION: Acitivity tolerance improvements (general strength/endurance).  FOTO reassessment soon (approx. 30 days).    recent cancer hx and lumpectomy.   Chyrel Masson, PT, DPT, OCS, ATC 11/28/22  1:35 PM    Discharge addendum 01/05/2023  PHYSICAL THERAPY DISCHARGE SUMMARY  Visits from Start of Care: 5  Current functional level related to goals / functional outcomes: Unable to be assessed   Remaining deficits: Unable to be assessed   Education / Equipment: Unable to be assessed  Patient goals were unable to be assessed. Patient is being discharged due to not returning since the last visit.     Ashley Murrain PT, DPT 01/05/2023 11:55 AM

## 2022-11-30 ENCOUNTER — Encounter: Payer: Medicare Other | Admitting: Rehabilitative and Restorative Service Providers"

## 2022-12-05 ENCOUNTER — Encounter: Payer: Medicare Other | Admitting: Rehabilitative and Restorative Service Providers"

## 2022-12-06 ENCOUNTER — Ambulatory Visit: Payer: Medicare Other | Admitting: Adult Health

## 2022-12-06 ENCOUNTER — Ambulatory Visit: Payer: Medicare Other

## 2022-12-06 ENCOUNTER — Encounter: Payer: Self-pay | Admitting: Adult Health

## 2022-12-06 VITALS — BP 120/58 | HR 72 | Temp 98.3°F | Ht 63.5 in | Wt 136.0 lb

## 2022-12-06 DIAGNOSIS — M542 Cervicalgia: Secondary | ICD-10-CM | POA: Diagnosis not present

## 2022-12-06 DIAGNOSIS — Z23 Encounter for immunization: Secondary | ICD-10-CM | POA: Diagnosis not present

## 2022-12-06 DIAGNOSIS — M533 Sacrococcygeal disorders, not elsewhere classified: Secondary | ICD-10-CM | POA: Diagnosis not present

## 2022-12-06 DIAGNOSIS — J449 Chronic obstructive pulmonary disease, unspecified: Secondary | ICD-10-CM

## 2022-12-06 DIAGNOSIS — I639 Cerebral infarction, unspecified: Secondary | ICD-10-CM | POA: Diagnosis not present

## 2022-12-06 DIAGNOSIS — I10 Essential (primary) hypertension: Secondary | ICD-10-CM | POA: Diagnosis not present

## 2022-12-06 DIAGNOSIS — F419 Anxiety disorder, unspecified: Secondary | ICD-10-CM | POA: Diagnosis not present

## 2022-12-06 DIAGNOSIS — Z170 Estrogen receptor positive status [ER+]: Secondary | ICD-10-CM

## 2022-12-06 DIAGNOSIS — C50511 Malignant neoplasm of lower-outer quadrant of right female breast: Secondary | ICD-10-CM

## 2022-12-06 DIAGNOSIS — E785 Hyperlipidemia, unspecified: Secondary | ICD-10-CM

## 2022-12-06 DIAGNOSIS — Z0001 Encounter for general adult medical examination with abnormal findings: Secondary | ICD-10-CM

## 2022-12-06 DIAGNOSIS — S0990XA Unspecified injury of head, initial encounter: Secondary | ICD-10-CM

## 2022-12-06 DIAGNOSIS — Z0000 Encounter for general adult medical examination without abnormal findings: Secondary | ICD-10-CM

## 2022-12-06 DIAGNOSIS — F32A Depression, unspecified: Secondary | ICD-10-CM

## 2022-12-06 LAB — CBC WITH DIFFERENTIAL/PLATELET
Basophils Absolute: 0.1 10*3/uL (ref 0.0–0.1)
Basophils Relative: 0.9 % (ref 0.0–3.0)
Eosinophils Absolute: 0.1 10*3/uL (ref 0.0–0.7)
Eosinophils Relative: 1.3 % (ref 0.0–5.0)
HCT: 34.7 % — ABNORMAL LOW (ref 36.0–46.0)
Hemoglobin: 11.4 g/dL — ABNORMAL LOW (ref 12.0–15.0)
Lymphocytes Relative: 28.9 % (ref 12.0–46.0)
Lymphs Abs: 1.9 10*3/uL (ref 0.7–4.0)
MCHC: 32.7 g/dL (ref 30.0–36.0)
MCV: 91.2 fl (ref 78.0–100.0)
Monocytes Absolute: 0.5 10*3/uL (ref 0.1–1.0)
Monocytes Relative: 7.2 % (ref 3.0–12.0)
Neutro Abs: 4.1 10*3/uL (ref 1.4–7.7)
Neutrophils Relative %: 61.7 % (ref 43.0–77.0)
Platelets: 229 10*3/uL (ref 150.0–400.0)
RBC: 3.81 Mil/uL — ABNORMAL LOW (ref 3.87–5.11)
RDW: 14.3 % (ref 11.5–15.5)
WBC: 6.6 10*3/uL (ref 4.0–10.5)

## 2022-12-06 LAB — COMPREHENSIVE METABOLIC PANEL
ALT: 11 U/L (ref 0–35)
AST: 18 U/L (ref 0–37)
Albumin: 4.3 g/dL (ref 3.5–5.2)
Alkaline Phosphatase: 51 U/L (ref 39–117)
BUN: 17 mg/dL (ref 6–23)
CO2: 30 mEq/L (ref 19–32)
Calcium: 9.7 mg/dL (ref 8.4–10.5)
Chloride: 103 mEq/L (ref 96–112)
Creatinine, Ser: 0.87 mg/dL (ref 0.40–1.20)
GFR: 63.1 mL/min (ref >60.00)
Glucose, Bld: 95 mg/dL (ref 70–99)
Potassium: 3.7 mEq/L (ref 3.5–5.1)
Sodium: 142 mEq/L (ref 135–145)
Total Bilirubin: 1.2 mg/dL (ref 0.2–1.2)
Total Protein: 6.9 g/dL (ref 6.0–8.3)

## 2022-12-06 LAB — LIPID PANEL
Cholesterol: 139 mg/dL (ref 0–200)
HDL: 71.6 mg/dL (ref >39.00)
LDL Cholesterol: 44 mg/dL (ref 0–99)
NonHDL: 67.84
Total CHOL/HDL Ratio: 2
Triglycerides: 117 mg/dL (ref 0.0–149.0)
VLDL: 23.4 mg/dL (ref 0.0–40.0)

## 2022-12-06 NOTE — Patient Instructions (Signed)
It was great seeing you today   We will follow up with you regarding your lab work   Please let me know if you need anything

## 2022-12-06 NOTE — Progress Notes (Signed)
Subjective:    Patient ID: Alexandria [name], female    DOB: [birth year], 82 y.o.   MRN: [id number]  HPI Patient presents for yearly preventative medicine examination. 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  Essential hypertension-takes Norvasc 10 mg daily and HCTZ 12.5 mg twice daily.  She denies dizziness, lightheadedness, chest pain.  BP Readings from Last 3 Encounters:  12/06/22 (!) 120/58  10/11/22 (!) 145/74  09/28/22 (!) 154/62   Hyperlipidemia-has not tolerated atorvastatin in the past due to cramping.  Currently on simvastatin 10 mg daily. Lab Results  Component Value Date   CHOL 145 08/17/2022   HDL 76 08/17/2022   LDLCALC 48 08/17/2022   LDLDIRECT 183.5 12/17/2008   TRIG 120 08/17/2022   CHOLHDL 1.9 08/17/2022    Anxiety/depression-takes Lexapro 10 mg daily and feels well controlled on this medication  H/o CVA -occurred in the setting of a fall back in 2010.  She is on Plavix 75 mg daily and tolerates this medication well.  COPD -managed by pulmonary.  Currently prescribed Advair 2 puffs twice daily, Singulair 10 mg daily, and albuterol inhaler  as needed.  History of breast cancer - right sided, s/p lumpectomy. She is currently on letrazole x 5 years.   Mechanical Fall - She reports that she fell outside about 2 days ago.She was trimming her rose bushes and fell backwards over a cement statue. She landed on her back and hitting head on soft ground. She has pain in her coccyx area and neck. She denies LOC. Does not have a headache or blurred vision.   All immunizations and health maintenance protocols were reviewed with the patient and needed orders were placed.  Appropriate screening laboratory values were ordered for the patient including screening of hyperlipidemia, renal function and hepatic function. If indicated by BPH, a PSA was ordered.  Medication reconciliation,  past medical history, social history, problem list and allergies were reviewed in detail with the patient  Goals were established with regard to weight loss, exercise, and  diet in compliance with medications   Review of Systems  Constitutional: Negative.   HENT: Negative.    Eyes: Negative.   Respiratory: Negative.    Cardiovascular: Negative.   Gastrointestinal: Negative.   Endocrine: Negative.   Genitourinary: Negative.   Musculoskeletal:  Positive for arthralgias.  Skin: Negative.   Allergic/Immunologic: Negative.   Neurological: Negative.   Hematological: Negative.   Psychiatric/Behavioral: Negative.     Past Medical History:  Diagnosis Date   Allergy    Anemia    hx of    Anxiety    Arthritis    left knee, hands, back   Asthma    daily and prn inhalers   Atypical chest pain 11/26/2020   Basal cell carcinoma (BCC) 2019   Mohs on forehead early 2019   Brain aneurysm    Brainstem infarct,  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 problems with balance   Breast cancer (HCC) 12/2011   right, ER+, PR -, Her 2 -   Coronary artery disease    Degenerative joint disease of low back    Depression    Dysrhythmia    atrial fib     GERD (gastroesophageal reflux disease)    Hearing loss    bilateral hearing aids   History of glomerulonephritis as a child   and had abscess left kidney   History of kidney stones    Hx of radiation therapy 03/12/12 -04/13/12   right breast   Hyperlipidemia    Hypertension    under control, has been on med. since age 14   IBS (irritable bowel syndrome)    Long term current use of aromatase inhibitor 05/08/2012   Melanoma (HCC) 2019   excised from nose early 2019   Overactive bladder    Palpitations    Pneumonia    hx of x 2    PONV (postoperative nausea and vomiting)    PVD (peripheral vascular disease) (HCC)    varicose veins - left worse than right   Spinal stenosis    Stress incontinence    Stroke [facility])    problem with expressive communicaiton   Traumatic hemopneumothorax 1982   left    Social History   Socioeconomic History   Marital status: Widowed    Spouse name: Not on file   Number of children: Not on file   Years of education: Not on file   Highest education level: Not on file  Occupational History   Not on file  Tobacco Use   Smoking status: Former    Current packs/day: 0.00    Average packs/day: 0.5 packs/day for 15.0 years (7.5 ttl pk-yrs)    Types: Cigarettes    Start date: 08/29/1978    Quit date: 08/28/1993    Years since quitting: 29.2   Smokeless tobacco: Never  Vaping Use   Vaping status: Never Used  Substance and Sexual Activity   Alcohol use: No    Alcohol/week: 0.0 standard drinks of alcohol   Drug use: No   Sexual activity: Not Currently  Other Topics Concern   Not on file  Social History Narrative   Widowed since 2014   Retired Set designer    Son and daughter   Social Determinants of Health   Financial Resource Strain: Low Risk  (05/25/2022)   Received from [employer], [facility]   Overall Financial Resource Strain (CARDIA)    Difficulty of Paying Living Expenses: Not hard at all  Food Insecurity: No Food Insecurity  (08/23/2022)   Hunger Vital Sign    Worried About Running Out of Food in the Last Year: Never true    Ran Out of Food in the Last Year: Never true  Recent Concern: Food Insecurity - Food Insecurity Present (08/23/2022)   Hunger Vital Sign    Worried About Running Out of Food in the Last Year: Sometimes true    Ran Out of Food in the Last Year: Never true  Transportation Needs: Unknown (08/23/2022)   PRAPARE - Administrator, Civil Service (Medical): Not on file    Lack of Transportation (Non-Medical): No  Physical Activity: Insufficiently Active (11/26/2020)   Exercise Vital Sign    Days of Exercise per Week: 3 days    Minutes of Exercise per Session: 30 min  Stress: Not on file  Social Connections:  Unknown (07/15/2021)   Received from [facility], [facility]   Social Network    Social Network: Not on file  Intimate Partner Violence: Not At Risk (08/23/2022)   Humiliation, Afraid, Rape, and Kick questionnaire    Fear of Current or Ex-Partner: No    Emotionally Abused: No    Physically Abused: No    Sexually Abused: No    Past Surgical History:  Procedure Laterality Date   ANKLE HARDWARE REMOVAL     right   BREAST BIOPSY  1976   right   BREAST BIOPSY Left 2019   benign lymph node   BREAST BIOPSY Right 08/10/2022   MM RT BREAST BX W LOC DEV 1ST LESION IMAGE BX SPEC STEREO GUIDE 08/10/2022 [facility]   BREAST BIOPSY  09/27/2022   MM RT RADIOACTIVE SEED LOC MAMMO GUIDE 09/27/2022 [facility]   BREAST LUMPECTOMY  2013   right with snbx   BREAST LUMPECTOMY WITH RADIOACTIVE SEED LOCALIZATION Right 09/28/2022   Procedure: RIGHT BREAST LUMPECTOMY WITH RADIOACTIVE SEED LOCALIZATION;  Surgeon: Harriette Bouillon, MD;  Location: [facility];  Service: General;  Laterality: Right;   CHOLECYSTECTOMY  1990s   COLONOSCOPY     CYSTOSCOPY  07/05/2005   KNEE SURGERY  1980s   left   KYPHOPLASTY N/A 06/15/2017   Procedure: KYPHOPLASTY T11;  Surgeon: Venita Lick, MD;  Location: [facility];  Service: Orthopedics;  Laterality: N/A;  90 mins   ORIF ANKLE FRACTURE  1980s   right    TOTAL KNEE ARTHROPLASTY Left 12/08/2015   Procedure: LEFT TOTAL KNEE ARTHROPLASTY;  Surgeon: Durene Romans, MD;  Location: [facility];  Service: Orthopedics;  Laterality: Left;   TRANSOBTURATOR SLING  07/05/2005   TUBAL LIGATION  1976   VEIN SURGERY     vein ablation left leg    Family History  Problem Relation Age of Onset   Stroke Mother    Heart attack Father        Died age 44   Breast cancer Daughter 4       reportedly BRCA+   Breast cancer Maternal Aunt 50   Multiple myeloma Paternal Aunt 89 - 69   Breast cancer Paternal Aunt 85 - 65   Breast cancer Paternal Aunt 61 - 79       bilateral breast cancer   Dementia Paternal Aunt    Stomach cancer Paternal Uncle 26 - 31   Breast cancer Paternal Uncle 88 - 69   Congestive Heart Failure Paternal Uncle        Ischemic   Congestive Heart Failure Paternal Uncle        Ischemic   Heart attack Paternal Grandfather    Breast cancer Cousin        3 maternal first cousins    Allergies  Allergen Reactions   Augmentin [Amoxicillin-Pot Clavulanate] Itching, Rash and Other (See Comments)    PT DEVELOPED SEVERE RASH INVOLVING MUCUS MEMBRANES or SKIN NECROSIS WITH PENICILLINS: #  #  #  YES  #  #  #     Penicillins Hives and Other (See Comments)    Has patient had a PCN reaction causing immediate rash, facial/tongue/throat swelling, SOB or lightheadedness with hypotension: No HAS PT DEVELOPED SEVERE RASH INVOLVING MUCUS MEMBRANES or SKIN NECROSIS: #  #  #  YES  #  #  #   Has patient had a PCN reaction that required hospitalization: No Has patient had a PCN reaction occurring within the last  10 years: No    Aspirin Hives   Chlorhexidine Gluconate Rash   Chocolate Hives and Other (See Comments)    RUNNY NOSE    Coffee Bean Extract Hives and Other (See Comments)    RUNNY NOSE    Diclofenac Other (See Comments)    Ibuprofen Hives and Other (See Comments)    RUNNY NOSE   Oxycodone Hcl Hives and Nausea And Vomiting   Shrimp [Shellfish Allergy] Hives and Other (See Comments)    RUNNY NOSE    Sulfonamide Derivatives Hives   Lipitor [Atorvastatin Calcium]     Leg Pain/Joint Pain/Back Pain   Methocarbamol Other (See Comments)    Current Outpatient Medications on File Prior to Visit  Medication Sig Dispense Refill   acetaminophen (TYLENOL) 650 MG CR tablet Take 650 mg by mouth every 8 (eight) hours as needed for pain.     albuterol (PROAIR HFA) 108 (90 Base) MCG/ACT inhaler Inhale 2 puffs into the lungs every 6 (six) hours as needed for wheezing or shortness of breath. 8 g 5   amLODipine (NORVASC) 10 MG tablet TAKE ONE TABLET EVERY DAY 90 tablet 1   ARNICA EX Apply 1 application topically daily as needed (pain).     b complex vitamins tablet Take 1 tablet by mouth 2 (two) times daily. PLUS FOLIC ACID PLUS VIT. C     Biotin 5000 MCG CAPS Take 5,000 mcg by mouth daily.      Capsaicin 0.025 % PADS Apply 1 application topically daily as needed (pain).     Cholecalciferol (VITAMIN D-3) 5000 UNITS TABS Take 5,000 Units by mouth daily.      clopidogrel (PLAVIX) 75 MG tablet TAKE ONE TABLET BY MOUTH DAILY 90 tablet 3   CO-ENZYME Q10-VITAMIN E PO Take 1 tablet by mouth daily.     Diclofenac Sodium (PENNSAID) 2 % SOLN Apply 1 application topically 3 (three) times daily. (Patient taking differently: Apply 1 application  topically as needed.) 112 g 0   diphenhydrAMINE (BENADRYL) 25 MG tablet Take 25 mg by mouth every 6 (six) hours as needed. Patient taking 1/2 (12.5 mg) as needed for allergies and itching     escitalopram (LEXAPRO) 10 MG tablet TAKE ONE TABLET BY MOUTH ONCE DAILY 90 tablet 0   Evolocumab (REPATHA SURECLICK) 140 MG/ML SOAJ Inject 140 mg into the skin every 14 (fourteen) days. 6 mL 3   fluticasone (FLONASE) 50 MCG/ACT nasal spray Place 1 spray into both nostrils daily. 16 g 3   fluticasone-salmeterol  (ADVAIR HFA) 230-21 MCG/ACT inhaler Inhale 2 puffs into the lungs 2 times daily. 12 g 0   hydrochlorothiazide (MICROZIDE) 12.5 MG capsule TAKE (1) CAPSULE TWICE DAILY. 180 capsule 3   ipratropium (ATROVENT) 0.03 % nasal spray Place 2 sprays into both nostrils every 12 (twelve) hours. 30 mL 0   isosorbide mononitrate (IMDUR) 30 MG 24 hr tablet Take 1 tablet (30 mg total) by mouth daily. Take at bedtime. 90 tablet 3   letrozole (FEMARA) 2.5 MG tablet Take 1 tablet (2.5 mg total) by mouth daily. 90 tablet 3   loratadine (CLARITIN) 10 MG tablet Take 10 mg by mouth daily as needed for allergies.     Misc Natural Products (OSTEO BI-FLEX ADV TRIPLE ST PO) Take 1 tablet by mouth daily.     montelukast (SINGULAIR) 10 MG tablet Take 1 tablet (10 mg total) by mouth at bedtime. 30 tablet 3   ondansetron (ZOFRAN-ODT) 4 MG disintegrating tablet DISSOLVE 1 TABLET ON TONGUE  EVERY 8 HOURS AS NEEDED FOR NAUSEA/VOMITING. 20 tablet 1   pantoprazole (PROTONIX) 40 MG tablet TAKE ONE TABLET BY MOUTH DAILY 90 tablet 3   Polyethyl Glycol-Propyl Glycol (SYSTANE OP) Place 1 drop into both eyes daily as needed. Dry eyes     Probiotic Product (ALIGN PO) Take 1 tablet by mouth daily.     Selenium 200 MCG CAPS Take 200 mcg by mouth 2 (two) times daily.      simvastatin (ZOCOR) 10 MG tablet Take 1 tablet (10 mg total) by mouth once a week. 13 tablet 3   traMADol (ULTRAM) 50 MG tablet TAKE ONE TABLET BY MOUTH EVERY 8 HOURS AS NEEDED 15 tablet 1   traMADol (ULTRAM) 50 MG tablet Take 1 tablet (50 mg total) by mouth every 6 (six) hours as needed. 20 tablet 0   Trolamine Salicylate (ASPERCREME EX) Apply 1 application topically daily as needed (pain).     UNABLE TO FIND Med Name: Neuriva Brain Supplement     Vibegron 75 MG TABS Take 1 tablet by mouth daily.     nitroGLYCERIN (NITROSTAT) 0.4 MG SL tablet Place 1 tablet (0.4 mg total) under the tongue every 5 (five) minutes as needed for chest pain. 25 tablet 3   potassium chloride  (KLOR-CON) 10 MEQ tablet Take 2 tablets (20 mEq total) by mouth 2 (two) times daily. 360 tablet 2   No current facility-administered medications on file prior to visit.    BP (!) 120/58   Pulse 72   Temp 98.3 F (36.8 C) (Oral)   Ht 5' 3.5" (1.613 m)   Wt 136 lb (61.7 kg)   SpO2 98%   BMI 23.71 kg/m       Objective:   Physical Exam Vitals and nursing note reviewed.  Constitutional:      General: She is not in acute distress.    Appearance: Normal appearance. She is not ill-appearing.  HENT:     Head: Normocephalic and atraumatic.     Right Ear: Tympanic membrane, ear canal and external ear normal. There is no impacted cerumen.     Left Ear: Tympanic membrane, ear canal and external ear normal. There is no impacted cerumen.     Nose: Nose normal. No congestion or rhinorrhea.     Mouth/Throat:     Mouth: Mucous membranes are moist.     Pharynx: Oropharynx is clear.  Eyes:     Extraocular Movements: Extraocular movements intact.     Conjunctiva/sclera: Conjunctivae normal.     Pupils: Pupils are equal, round, and reactive to light.  Neck:     Vascular: No carotid bruit.  Cardiovascular:     Rate and Rhythm: Normal rate and regular rhythm.     Pulses: Normal pulses.     Heart sounds: No murmur heard.    No friction rub. No gallop.  Pulmonary:     Effort: Pulmonary effort is normal.     Breath sounds: Normal breath sounds.  Abdominal:     General: Abdomen is flat. Bowel sounds are normal. There is no distension.     Palpations: Abdomen is soft. There is no mass.     Tenderness: There is no abdominal tenderness. There is no guarding or rebound.     Hernia: No hernia is present.  Musculoskeletal:        General: Tenderness present. No swelling. Normal range of motion.     Cervical back: Normal range of motion and neck supple.  Lymphadenopathy:  Cervical: No cervical adenopathy.  Skin:    General: Skin is warm and dry.     Capillary Refill: Capillary refill takes  less than 2 seconds.  Neurological:     General: No focal deficit present.     Mental Status: She is alert and oriented to person, place, and time.  Psychiatric:        Mood and Affect: Mood normal.        Behavior: Behavior normal.        Thought Content: Thought content normal.        Judgment: Judgment normal.        Assessment & Plan:  1. Routine general medical examination at a health care facility Today patient counseled on age appropriate routine health concerns for screening and prevention, each reviewed and up to date or declined. Immunizations reviewed and up to date or declined. Labs ordered and reviewed. Risk factors for depression reviewed and negative. Hearing function and visual acuity are intact. ADLs screened and addressed as needed. Functional ability and level of safety reviewed and appropriate. Education, counseling and referrals performed based on assessed risks today. Patient provided with a copy of personalized plan for preventive services. - Follow up in one year or sooner if needed  2. Hyperlipidemia, unspecified hyperlipidemia type - Consider increase in simvastatin  - CBC with Differential/Platelet; Future - Comprehensive metabolic panel; Future - Lipid panel; Future  3. Essential hypertension, benign - Well controlled. No change in medication  - CBC with Differential/Platelet; Future - Comprehensive metabolic panel; Future - Lipid panel; Future  4. Anxiety and depression - Well controlled. No change in medication  - CBC with Differential/Platelet; Future - Comprehensive metabolic panel; Future - Lipid panel; Future  5. Cerebrovascular accident (CVA), unspecified mechanism (HCC) - Continue with Plavix daily  - CBC with Differential/Platelet; Future - Comprehensive metabolic panel; Future - Lipid panel; Future  6. Chronic obstructive pulmonary disease, unspecified COPD type (HCC) - Per pulmonary  - CBC with Differential/Platelet; Future -  Comprehensive metabolic panel; Future - Lipid panel; Future  7. Neck pain  - DG Sacrum/Coccyx; Future  8. Coccyx pain  - DG Cervical Spine Complete; Future  9. Injury of head, initial encounter - Will need CT head since she is on Plavix  - CT HEAD WO CONTRAST ( ); Future  10. Need for influenza vaccination  - Flu Vaccine Trivalent High Dose (Fluad)  11. Malignant neoplasm of lower-outer quadrant of right breast of female, estrogen receptor positive (HCC) - Per oncology   [name], NP

## 2022-12-07 ENCOUNTER — Encounter: Payer: Medicare Other | Admitting: Rehabilitative and Restorative Service Providers"

## 2022-12-09 ENCOUNTER — Telehealth: Payer: Self-pay | Admitting: Adult Health

## 2022-12-09 NOTE — Telephone Encounter (Signed)
Pt called, requesting lab results. CMA was with a patient. Pt requested a call back to go over lab results.

## 2022-12-09 NOTE — Telephone Encounter (Signed)
Pt called back to say she missed call. Pt states her vm is not working at this time.  CMA was unavailable. Please call Pt back, when you can.

## 2022-12-09 NOTE — Telephone Encounter (Signed)
Pt is waiting on imaging report.

## 2022-12-09 NOTE — Telephone Encounter (Signed)
Patient notified of update  and verbalized understanding. 

## 2022-12-09 NOTE — Telephone Encounter (Signed)
Tried to call pt. Alexandria [name] left a message on her labs.

## 2022-12-18 ENCOUNTER — Ambulatory Visit (HOSPITAL_BASED_OUTPATIENT_CLINIC_OR_DEPARTMENT_OTHER)
Admission: RE | Admit: 2022-12-18 | Discharge: 2022-12-18 | Disposition: A | Discharge status: Home / Self Care | Payer: Medicare Other | Source: Ambulatory Visit | Attending: Adult Health | Admitting: Adult Health

## 2022-12-18 DIAGNOSIS — S0990XA Unspecified injury of head, initial encounter: Secondary | ICD-10-CM | POA: Diagnosis present

## 2022-12-19 ENCOUNTER — Other Ambulatory Visit: Payer: Self-pay | Admitting: Adult Health

## 2022-12-19 DIAGNOSIS — I10 Essential (primary) hypertension: Secondary | ICD-10-CM

## 2022-12-19 DIAGNOSIS — F419 Anxiety disorder, unspecified: Secondary | ICD-10-CM

## 2022-12-19 DIAGNOSIS — K219 Gastro-esophageal reflux disease without esophagitis: Secondary | ICD-10-CM

## 2022-12-19 DIAGNOSIS — F32A Depression, unspecified: Secondary | ICD-10-CM

## 2022-12-20 ENCOUNTER — Other Ambulatory Visit: Payer: Self-pay | Admitting: Adult Health

## 2022-12-20 DIAGNOSIS — E876 Hypokalemia: Secondary | ICD-10-CM

## 2022-12-20 NOTE — Telephone Encounter (Signed)
Okay for refill?  

## 2022-12-26 ENCOUNTER — Telehealth: Payer: Self-pay | Admitting: Adult Health

## 2022-12-26 NOTE — Telephone Encounter (Signed)
Requesting to speak with someone regarding xrays taken 12/06/22 and scan done 12/18/22

## 2022-12-29 NOTE — Telephone Encounter (Signed)
Pt notified of results from [facility] but awaiting Dexa scan results.

## 2022-12-30 ENCOUNTER — Other Ambulatory Visit (HOSPITAL_BASED_OUTPATIENT_CLINIC_OR_DEPARTMENT_OTHER): Payer: Self-pay | Admitting: Adult Health

## 2022-12-30 DIAGNOSIS — E785 Hyperlipidemia, unspecified: Secondary | ICD-10-CM

## 2023-01-04 ENCOUNTER — Inpatient Hospital Stay: Payer: Medicare Other | Admitting: Adult Health

## 2023-01-05 ENCOUNTER — Telehealth: Payer: Self-pay | Admitting: Adult Health

## 2023-01-05 NOTE — Telephone Encounter (Signed)
Scheduled appointments per referral. Patient is aware of the appointment time and date as well as the address. Patient was informed to arrive 10-15 minutes prior with updated insurance information. All questions were answered.

## 2023-01-26 ENCOUNTER — Inpatient Hospital Stay: Payer: Medicare Other | Admitting: Adult Health

## 2023-01-26 ENCOUNTER — Inpatient Hospital Stay: Payer: Medicare Other

## 2023-01-26 ENCOUNTER — Other Ambulatory Visit: Payer: Self-pay | Admitting: *Deleted

## 2023-01-26 ENCOUNTER — Telehealth: Payer: Self-pay | Admitting: Adult Health

## 2023-01-26 MED ORDER — LETROZOLE 2.5 MG PO TABS: 2.5000 mg | ORAL_TABLET | Freq: Every day | ORAL | 3 refills | Status: DC

## 2023-02-06 ENCOUNTER — Ambulatory Visit (HOSPITAL_BASED_OUTPATIENT_CLINIC_OR_DEPARTMENT_OTHER): Payer: Medicare Other | Admitting: Cardiovascular Disease

## 2023-02-20 ENCOUNTER — Other Ambulatory Visit: Payer: Self-pay | Admitting: Adult Health

## 2023-02-21 NOTE — Telephone Encounter (Signed)
Okay for refill?  

## 2023-02-23 ENCOUNTER — Inpatient Hospital Stay: Payer: Medicare Other | Admitting: Adult Health

## 2023-02-23 ENCOUNTER — Inpatient Hospital Stay: Payer: Medicare Other

## 2023-02-23 ENCOUNTER — Telehealth: Payer: Self-pay

## 2023-02-23 ENCOUNTER — Other Ambulatory Visit: Payer: Self-pay

## 2023-02-23 MED ORDER — LORAZEPAM 0.5 MG PO TABS: 0.5000 mg | ORAL_TABLET | Freq: Two times a day (BID) | ORAL | 0 refills | Status: DC | PRN

## 2023-02-23 NOTE — Progress Notes (Signed)
Order for Ativan 0.5 mg placed per MD.

## 2023-02-23 NOTE — Telephone Encounter (Signed)
Pt called and LVM in crisis, crying hysterically stating she can not tolerate letrozole; in VM reports depression and states, "I can't do this anymore." Attempted to call pt to advise per MD to stop Letrozole immediately and come in 12/16 for visit with NP. LVM for her to call back. Routed this message to covering RN to call pt if she does not call back today.

## 2023-02-23 NOTE — Telephone Encounter (Signed)
Pt returned call, in tears stating letrozole has made her feel so sick, stating she is easily irritable, depressed, anxious, in pain, can't sleep. She is asking for antianxiety medication. Per MD, order for Ativan 0.5 mg 1 tab BID PRN for one week placed. If pt needs refill she will need to contact PCP. She verbalized understanding.

## 2023-02-24 ENCOUNTER — Encounter: Payer: Self-pay | Admitting: General Practice

## 2023-02-24 NOTE — Progress Notes (Signed)
[facility] Spiritual Care Note  Reached Alexandria [name] by phone per referral from nursing due to high distress and anxiety. Alexandria [name] was very grateful for call and eager to meet; we plan to visit in person in Spiritual Care office following her Monday morning (12/16) appointment with Mardella Layman Causey/NP, as today she was too tired from errands to speak at length on the phone.   [address] [place], [place], [facility] Pager [phone] Voicemail [phone]

## 2023-02-27 ENCOUNTER — Inpatient Hospital Stay (HOSPITAL_BASED_OUTPATIENT_CLINIC_OR_DEPARTMENT_OTHER): Payer: Medicare Other | Admitting: Adult Health

## 2023-02-27 ENCOUNTER — Inpatient Hospital Stay: Payer: Medicare Other | Admitting: Adult Health

## 2023-02-27 ENCOUNTER — Encounter: Payer: Self-pay | Admitting: General Practice

## 2023-02-27 ENCOUNTER — Inpatient Hospital Stay: Payer: Medicare Other | Attending: Internal Medicine | Admitting: General Practice

## 2023-02-27 DIAGNOSIS — C50511 Malignant neoplasm of lower-outer quadrant of right female breast: Secondary | ICD-10-CM | POA: Diagnosis not present

## 2023-02-27 DIAGNOSIS — Z170 Estrogen receptor positive status [ER+]: Secondary | ICD-10-CM

## 2023-02-27 DIAGNOSIS — C50211 Malignant neoplasm of upper-inner quadrant of right female breast: Secondary | ICD-10-CM

## 2023-02-27 MED ORDER — ANASTROZOLE 1 MG PO TABS: 1.0000 mg | ORAL_TABLET | Freq: Every day | ORAL | 3 refills | Status: AC

## 2023-02-27 NOTE — Progress Notes (Signed)
[name] Cancer Follow up:    [name], NP [address] [place] [address]   DIAGNOSIS:  Cancer Staging  Breast cancer of upper-inner quadrant of right female breast [facility]) Staging form: Breast, AJCC 7th Edition - Clinical stage from 12/28/2011: Stage IIA (T2, N0, cM0) - Unsigned Staged by: Pathologist and managing physician Specimen type: Core Needle Biopsy Laterality: Right - Pathologic: No stage assigned - Unsigned Specimen type: Core Needle Biopsy Laterality: Right  Malignant neoplasm of lower-outer quadrant of right breast of female, estrogen receptor positive (HCC) Staging form: Breast, AJCC 8th Edition - Clinical stage from 08/17/2022: Stage IA (cT1c, cN0, cM0, G2, ER+, PR-, HER2-) - Signed by Ronny Bacon, PA-C on 08/17/2022 Stage prefix: Initial diagnosis Method of lymph node assessment: Clinical Histologic grading system: 3 grade system  I connected with [name] on 02/27/23 at  9:45 AM EST by telephone and verified that I am speaking with the correct person using two identifiers.  I discussed the limitations, risks, security and privacy concerns of performing an evaluation and management service by telephone and the availability of in person appointments.  I also discussed with the patient that there may be a patient responsible charge related to this service. The patient expressed understanding and agreed to proceed.   Patient location: home Provider location: [facility] office  SUMMARY OF ONCOLOGIC HISTORY: Oncology History  Breast cancer of upper-inner quadrant of right female breast (HCC)  01/04/2012 Surgery   Right breast lumpectomy and a 1.6 cm IDC with negative margins, 1 SLN negative grade 1 with low-grade DCIS, ER 100%, PR 0%, HER-2 negative ratio 1.14, Ki-67 27% T1 C. 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%, PR 0%, Ki67 1%, HER2 -0    09/28/2022 Surgery   Right lumpectomy: LCIS 1.5 cm, no residual invasive lobular cancer identified, margins negative, ER 100%, PR 0%, HER2 0, Ki-67 1% (on biopsy tumor size 0.4 cm, grade 1)   10/2022 -  Anti-estrogen oral therapy   Letrozole x 5 years   Malignant neoplasm of lower-outer quadrant of right breast of female, estrogen receptor positive (HCC)  08/16/2022 Initial Diagnosis   Malignant neoplasm of lower-outer quadrant of right breast of female, estrogen receptor positive (HCC)   08/17/2022 Cancer Staging   Staging form: Breast, AJCC 8th Edition - Clinical stage from 08/17/2022: Stage IA (cT1c, cN0, cM0, G2, ER+, PR-, HER2-) - Signed by Ronny Bacon, PA-C on 08/17/2022 Stage prefix: Initial diagnosis Method of lymph node assessment: Clinical Histologic grading system: 3 grade system    Genetic Testing   [facility] Custom Panel+RNA was Negative. Report date is 08/23/2022.  The Custom Hereditary Cancers Panel offered by [facility] includes sequencing and/or deletion duplication testing of the following 43 genes: APC, ATM, AXIN2, BAP1, BARD1, BMPR1A, BRCA1, BRCA2, BRIP1, CDH1, CDK4, CDKN2A (p14ARF and p16INK4a only), CHEK2, CTNNA1, EPCAM (Deletion/duplication testing only), FH, GREM1 (promoter region duplication testing only), HOXB13, KIT, MBD4, MEN1, MLH1, MSH2, MSH3, MSH6, MUTYH, NF1, NHTL1, PALB2,  PDGFRA, PMS2, POLD1, POLE, PTEN, RAD51C, RAD51D, SMAD4, SMARCA4. STK11, TP53, TSC1, TSC2, and VHL.   10/2022 -  Anti-estrogen oral therapy   Letrozole x 5 years     CURRENT THERAPY: Letrozole  INTERVAL  HISTORY:  Discussed the use of AI scribe software for clinical note transcription with the patient, who gave verbal consent to proceed.  Alexandria [name] 82 y.o. female with a history of breast cancer, presents with complaints of multiple adverse effects from Letrozole, an antiestrogen therapy. She reports experiencing insomnia, right hip and knee pain, nausea, hot flashes, alternating diarrhea and constipation, and hair loss. The patient also mentions a bald spot in the middle of her head, which she attributes to the medication. She reports that these symptoms were so severe that she decided to discontinue the medication a week ago.  The patient also describes an unusual pattern of excessive sleepiness, sleeping through most of the day and night, and missing meals. She reports feeling better since discontinuing the Letrozole, with improved sleep patterns and less nausea.  The patient also mentions frequent urination throughout the day. She has no other new symptoms or changes in her health status. She has not started any other antiestrogen therapy since discontinuing Letrozole.  In the past, the patient tolerated Anastrozole (Arimidex), another antiestrogen therapy, without any problems for five years. She expresses a preference to return to this medication.   Patient Active Problem List   Diagnosis Date Noted   Family history of breast cancer 08/18/2022   Genetic testing 08/17/2022   Malignant neoplasm of lower-outer quadrant of right breast of female, estrogen receptor positive (HCC) 08/16/2022   CAD in native artery 05/05/2022   Atypical chest pain 11/26/2020   S/P kyphoplasty 06/15/2017   S/P total knee replacement using cement 12/08/2015   Atrial fibrillation (HCC) 11/19/2015   Anxiety and depression 07/09/2015   Varicose veins of bilateral lower extremities with other complications 06/16/2015   Essential hypertension, benign 12/08/2014   Acute hemorrhagic cystitis 02/18/2014   Stroke  (HCC) 05/15/2012   Spinal stenosis    COPD (chronic obstructive pulmonary disease) (HCC)    Long term current use of aromatase inhibitor 05/08/2012   Hx of radiation therapy    Breast cancer of upper-inner quadrant of right female breast (HCC) 12/23/2011   Ataxia 06/23/2011   Contact dermatitis and eczema due to plant 01/04/2011   BACK PAIN 08/11/2009   PALPITATIONS, RECURRENT 02/27/2009   PERS HX TOBACCO USE PRESENTING HAZARDS HEALTH 12/17/2008   Hyperlipidemia 02/26/2008   ASTHMA 02/26/2008   GERD 02/12/2007    is allergic to augmentin [amoxicillin-pot clavulanate], penicillins, aspirin, chlorhexidine gluconate, chocolate, coffee bean extract, diclofenac, ibuprofen, oxycodone hcl, shrimp [shellfish allergy], sulfonamide derivatives, lipitor [atorvastatin calcium], and methocarbamol.  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 since age 19   IBS (irritable bowel syndrome)    Long term current use of aromatase inhibitor 05/08/2012   Melanoma (HCC) 2019   excised from nose early 2019   Overactive bladder    Palpitations    Pneumonia    hx of x 2    PONV  (postoperative nausea and vomiting)    PVD (peripheral vascular disease) (HCC)    varicose veins - left worse than right   Spinal stenosis    Stress incontinence    Stroke (HCC)    problem with expressive communicaiton   Traumatic hemopneumothorax 1982   left    SURGICAL HISTORY: Past Surgical History:  Procedure Laterality Date   ANKLE HARDWARE REMOVAL     right   BREAST BIOPSY  1976   right   BREAST BIOPSY Left 2019   benign lymph node   BREAST BIOPSY Right 08/10/2022   MM RT BREAST BX W LOC DEV 1ST LESION IMAGE BX SPEC STEREO GUIDE 08/10/2022 [facility]   BREAST BIOPSY  09/27/2022   MM RT RADIOACTIVE SEED LOC MAMMO GUIDE 09/27/2022 [facility]   BREAST LUMPECTOMY  2013   right with snbx   BREAST LUMPECTOMY WITH RADIOACTIVE SEED LOCALIZATION Right 09/28/2022   Procedure: RIGHT BREAST LUMPECTOMY WITH RADIOACTIVE SEED LOCALIZATION;  Surgeon: Harriette Bouillon, MD;  Location: [facility];  Service: General;  Laterality: Right;   CHOLECYSTECTOMY  1990s   COLONOSCOPY     CYSTOSCOPY  07/05/2005   KNEE SURGERY  1980s   left   KYPHOPLASTY N/A 06/15/2017   Procedure: KYPHOPLASTY T11;  Surgeon: Venita Lick, MD;  Location: [facility] OR;  Service: Orthopedics;  Laterality: N/A;  90 mins   ORIF ANKLE FRACTURE  1980s   right    TOTAL KNEE ARTHROPLASTY Left 12/08/2015   Procedure: LEFT TOTAL KNEE ARTHROPLASTY;  Surgeon: Durene Romans, MD;  Location: [facility];  Service: Orthopedics;  Laterality: Left;   TRANSOBTURATOR SLING  07/05/2005   TUBAL LIGATION  1976   VEIN SURGERY     vein ablation left leg    SOCIAL HISTORY: Social History   Socioeconomic History   Marital status: Widowed    Spouse name: Not on file   Number of children: Not on file   Years of education: Not on file   Highest education level: Not on file  Occupational History   Not on file  Tobacco Use   Smoking status: Former    Current packs/day: 0.00    Average packs/day: 0.5 packs/day for 15.0  years (7.5 ttl pk-yrs)    Types: Cigarettes    Start date: 08/29/1978    Quit date: 08/28/1993    Years since quitting: 29.5   Smokeless tobacco: Never  Vaping Use   Vaping status: Never Used  Substance and Sexual Activity   Alcohol use: No    Alcohol/week: 0.0 standard drinks of alcohol   Drug use: No   Sexual activity: Not Currently  Other Topics Concern   Not on file  Social History Narrative   Widowed since 2014   Retired Set designer    Son  and daughter   Social Drivers of Corporate investment banker Strain: Low Risk  (05/25/2022)   Received from [facility], [facility]   Overall Financial Resource Strain (CARDIA)    Difficulty of Paying Living Expenses: Not hard at all  Food Insecurity: No Food Insecurity (08/23/2022)   Hunger Vital Sign    Worried About Running Out of Food in the Last Year: Never true    Ran Out of Food in the Last Year: Never true  Recent Concern: Food Insecurity - Food Insecurity Present (08/23/2022)   Hunger Vital Sign    Worried About Running Out of Food in the Last Year: Sometimes true    Ran Out of Food in the Last Year: Never true  Transportation Needs: Unknown (08/23/2022)   PRAPARE - Administrator, Civil Service (Medical): Not on file    Lack of Transportation (Non-Medical): No  Physical Activity: Insufficiently Active (11/26/2020)   Exercise Vital Sign    Days of Exercise per Week: 3 days    Minutes of Exercise per Session: 30 min  Stress: Not on file  Social Connections: Unknown (07/15/2021)   Received from [facility], [facility]   Social Network    Social Network: Not on file  Intimate Partner Violence: Not At Risk (08/23/2022)   Humiliation, Afraid, Rape, and Kick questionnaire    Fear of Current or Ex-Partner: No    Emotionally Abused: No    Physically Abused: No    Sexually Abused: No    FAMILY HISTORY: Family History  Problem Relation Age of Onset   Stroke Mother    Heart attack Father        Died age 52    Breast cancer Daughter 12       reportedly BRCA+   Breast cancer Maternal Aunt 80   Multiple myeloma Paternal Aunt 40 - 69   Breast cancer Paternal Aunt 80 - 65   Breast cancer Paternal Aunt 60 - 79       bilateral breast cancer   Dementia Paternal Aunt    Stomach cancer Paternal Uncle 1 - 13   Breast cancer Paternal Uncle 18 - 69   Congestive Heart Failure Paternal Uncle        Ischemic   Congestive Heart Failure Paternal Uncle        Ischemic   Heart attack Paternal Grandfather    Breast cancer Cousin        3 maternal first cousins    Review of Systems  Constitutional:  Negative for appetite change, chills, fatigue, fever and unexpected weight change.  HENT:   Negative for hearing loss, lump/mass and trouble swallowing.   Eyes:  Negative for eye problems and icterus.  Respiratory:  Negative for chest tightness, cough and shortness of breath.   Cardiovascular:  Negative for chest pain, leg swelling and palpitations.  Gastrointestinal:  Negative for abdominal distention, abdominal pain, constipation, diarrhea, nausea and vomiting.  Endocrine: Negative for hot flashes.  Genitourinary:  Negative for difficulty urinating.   Musculoskeletal:  Negative for arthralgias.  Skin:  Negative for itching and rash.  Neurological:  Negative for dizziness, extremity weakness, headaches and numbness.  Hematological:  Negative for adenopathy. Does not bruise/bleed easily.  Psychiatric/Behavioral:  Negative for depression. The patient is not nervous/anxious.       PHYSICAL EXAMINATION  Patient sounds well.  She is in no apparent distress.  Mood and behavior are normal.  Speech is normal.  ASSESSMENT and THERAPY PLAN:   Breast cancer of upper-inner quadrant of right female breast (HCC) 09/28/2022:Right lumpectomy: LCIS 1.5 cm, no residual invasive lobular cancer identified, margins negative, ER 100%, PR 0%, HER2 0, Ki-67 1% (on biopsy tumor size 0.4 cm, grade 1)  (01/04/2012:Right  breast lumpectomy and a 1.6 cm IDC with negative margins, 1 SLN negative grade 1 with low-grade DCIS, ER 100%, PR 0%, HER-2 negative ratio 1.14, Ki-67 27% T1 C. N0 M0 stage IA Oncotype DX score 9 7%ROR: Adjuvant radiation followed by 5 years of anastrozole)    Treatment plan: Adjuvant antiestrogen therapy with letrozole x 5 years  Breast Cancer on Letrozole Severe side effects including insomnia, joint pain, nausea, hot flashes, diarrhea, constipation, and hair loss. Letrozole was discontinued 1 week ago and symptoms have improved. -Start Anastrozole (previously tolerated well) after the new year. -Follow up with Dr. Georgiann Mohs in 3 months to assess tolerance to Anastrozole.    Follow up instructions:    -Return to cancer center in 3 months for f/u with Dr. Pamelia Hoit    The patient was provided an opportunity to ask questions and all were answered. The patient agreed with the plan and demonstrated an understanding of the instructions.   The patient was advised to call back or seek an in-person evaluation if the symptoms worsen or if the condition fails to improve as anticipated.   I provided 15 minutes of non face-to-face telephone visit time during this encounter, and > 50% was spent counseling as documented under my assessment & plan.   Lillard Anes, NP 02/27/23 2:09 PM Medical Oncology and Hematology [facility] [address] [place], [place] [address] Tel. [phone]    Fax. [phone]  *Total Encounter Time as defined by the Centers for Medicare and Medicaid Services includes, in addition to the face-to-face time of a patient visit (documented in the note above) non-face-to-face time: obtaining and reviewing outside history, ordering and reviewing medications, tests or procedures, care coordination (communications with other health care professionals or caregivers) and documentation in the medical record.

## 2023-02-27 NOTE — Progress Notes (Unsigned)
[facility] Spiritual Care Note  Converted in-person appointment to phone per Alexandria [name]' request. She reports that she is very relieved to be making the medication change that Alexandria [name]/NP suggested today, given the frustrating (anxiety/mood) side effects she has been experiencing, which in turn leaves her more excited and more joyful about anticipating her leisure time over the holidays. She also notes that her beloved Alexandria [name] is improving after surgery, which is another source of comfort and encouragement. These factors led to a significantly lighter/brighter outlook today than in past conversations.  Alexandria [name] is looking forward to when her schedule will allow an in-person Spiritual Care visit and plans to contact chaplain to schedule.   [address] [place], [place], [facility] Pager [phone] Voicemail [phone]

## 2023-02-27 NOTE — Assessment & Plan Note (Signed)
09/28/2022:Right lumpectomy: LCIS 1.5 cm, no residual invasive lobular cancer identified, margins negative, ER 100%, PR 0%, HER2 0, Ki-67 1% (on biopsy tumor size 0.4 cm, grade 1)  (01/04/2012:Right breast lumpectomy and a 1.6 cm IDC with negative margins, 1 SLN negative grade 1 with low-grade DCIS, ER 100%, PR 0%, HER-2 negative ratio 1.14, Ki-67 27% T1 C. N0 M0 stage IA Oncotype DX score 9 7%ROR: Adjuvant radiation followed by 5 years of anastrozole)    Treatment plan: Adjuvant antiestrogen therapy with letrozole x 5 years  Breast Cancer on Letrozole Severe side effects including insomnia, joint pain, nausea, hot flashes, diarrhea, constipation, and hair loss. Letrozole was discontinued 1 week ago and symptoms have improved. -Start Anastrozole (previously tolerated well) after the new year. -Follow up with Dr. Georgiann Mohs in 3 months to assess tolerance to Anastrozole.

## 2023-02-27 NOTE — Progress Notes (Signed)
[facility] Spiritual Care Note  Encounter created in error.   [address] [place], [place], [facility] Pager [phone] Voicemail [phone]

## 2023-03-06 ENCOUNTER — Encounter (HOSPITAL_BASED_OUTPATIENT_CLINIC_OR_DEPARTMENT_OTHER): Payer: Self-pay | Admitting: Family

## 2023-03-06 ENCOUNTER — Ambulatory Visit (HOSPITAL_BASED_OUTPATIENT_CLINIC_OR_DEPARTMENT_OTHER): Payer: Medicare Other | Admitting: Family

## 2023-03-06 VITALS — BP 130/62 | HR 81 | Ht 63.5 in | Wt 138.1 lb

## 2023-03-06 DIAGNOSIS — I10 Essential (primary) hypertension: Secondary | ICD-10-CM

## 2023-03-06 DIAGNOSIS — E785 Hyperlipidemia, unspecified: Secondary | ICD-10-CM | POA: Diagnosis not present

## 2023-03-06 DIAGNOSIS — Z8673 Personal history of transient ischemic attack (TIA), and cerebral infarction without residual deficits: Secondary | ICD-10-CM

## 2023-03-06 DIAGNOSIS — I25118 Atherosclerotic heart disease of native coronary artery with other forms of angina pectoris: Secondary | ICD-10-CM | POA: Diagnosis not present

## 2023-03-06 MED ORDER — CLOPIDOGREL BISULFATE 75 MG PO TABS: 75.0000 mg | ORAL_TABLET | ORAL | 3 refills | Status: AC

## 2023-03-06 NOTE — Patient Instructions (Addendum)
Medication Instructions:  Your physician has recommended you make the following change in your medication:    CHANGE Clopidogrel (Plavix) to three times per week  *If you need a refill on your cardiac medications before your next appointment, please call your pharmacy*   Lab Work: Your physician recommends that you return for lab work today:  BMP  If you have labs (blood work) drawn today and your tests are completely normal, you will receive your results only by: MyChart Message (if you have MyChart) OR A paper copy in the mail If you have any lab test that is abnormal or we need to change your treatment, we will call you to review the results.   Testing/Procedures: Your EKG today showed normal sinus rhythm which is a great result!  Follow-Up: At [facility], you and your health needs are our priority.  As part of our continuing mission to provide you with exceptional heart care, we have created designated Provider Care Teams.  These Care Teams include your primary Cardiologist (physician) and Advanced Practice Providers (APPs -  Physician Assistants and Nurse Practitioners) who all work together to provide you with the care you need, when you need it.  We recommend signing up for the patient portal called "MyChart".  Sign up information is provided on this After Visit Summary.  MyChart is used to connect with patients for Virtual Visits (Telemedicine).  Patients are able to view lab/test results, encounter notes, upcoming appointments, etc.  Non-urgent messages can be sent to your provider as well.   To learn more about what you can do with MyChart, go to [email or web address].    Your next appointment:   6-9 month(s)  Provider:   Chilton Si, MD or Gillian Shields, NP    Other Instructions     Could stop Plavix as needed or desired 5 days prior to procedures. We would recommend continuing Plavix to help prevent future heart attack or stroke. We have reduced it to  3 times per week.

## 2023-03-06 NOTE — Progress Notes (Signed)
Cardiology Office Note:  .   Date:  03/06/2023  ID:  Alexandria [name], DOB [birth year], MRN [id number] PCP: [name], NP  [facility] Providers Cardiologist:  Chilton Si, MD    History of Present Illness: .   Alexandria [name] is a 82 y.o. female with a hx of CAD, DVT, anemia, arthritis, anxiety, asthma, acute brainstem infarct, right breast cancer s/p radiation therapy with recurrence 07/2022 requiring 09/2022 lumpectomy, COPD, DJD, depression, atrial fibrillation, GERD, hearing loss, glomerulonephritis, hyperlipidemia, hypertension, IBS, pneumonia, PVD (varicose veins), spinal stenosis, left traumatic hemopneumothorax.   Prior stress test 2010 with EF 82%, no ischemia nor infarct. Previously evaluated in 2014 by Dr. Antoine Poche after diagnosis of stroke -due to palpitations atenolol was transitioned to Toprol.  Echocardiogram was recommended and performed 05/2012 revealing EF 60%, no RWMA, no cardiac source of emboli.  Myoview 2014 with no evidence of ischemia nor infarct.   Evaluated 11/26/2020 after referral from PCP for hypertension.  Noted prior to a scheduled posterior vaginal wall repair 05/31/2020 she had mechanical fall.  Subsequently developed 3 small thrombi above the ventricles in her posterior head.  Another fall where she fell backwards on concrete and fractured T11 vertebrae.  Then tore her left rotator cuff.  Posterior vaginal wall repair was never able to be completed due to these medical issues but she was inclined to have that surgery.  She did note previous muscle cramps with Atorvastatin. Due to chest pain, cardiac CTA 11/2020 coronary calcium score of 71 placing her in the 47th percentile for age and sex matched control (RCA 25-49%, proximal LAD 0-24%, proximal large first diagonal 25-49%).  FFR with no significant stenosis.   Due to myalgias, simvastatin has been reduced to once per week and Repatha initiated. She lost her daughter to breast cancer 05/2022 and had increase  in antihypertensive regimen due to elevated BP.     She presents today for follow-up.  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  Exercise limited by orthopedic issues. She is feeling much better since being off Letrozole. Her blood pressure has improved and is routinely at goal <130/80. She does have Kardia Mobile at home which she has not yet started using. Reports occasional palpitations if dehydrated.  Chief concern today is getting off Plavix. She attributes bruising from Plavix to the delay from 07/2022 breast cancer replase to 09/2022 lumpectomy. We reviewed in detail the indication for antiplatelet therapy due to her history of CVA and CAD. Prior allergy to ASA with hives. WE discussed at length and she was agreeable to continue at 3 times per week with knowledge she could hold as needed for procedures.   ROS: Please see the history of present illness.    All other systems reviewed and are negative.   Studies Reviewed: Marland Kitchen   EKG Interpretation Date/Time:  Monday March 06 2023 14:17:04 EST Ventricular Rate:  69 PR Interval:  204 QRS Duration:  82 QT Interval:  408 QTC Calculation: 437 R Axis:   31  Text Interpretation: Normal sinus rhythm Normal ECG Confirmed by Gillian Shields ([id number]) on 03/06/2023 2:21:58 PM    Cardiac Studies & Procedures         CT SCANS  CT CORONARY FRACTIONAL FLOW RESERVE DATA PREP 12/03/2020  Narrative EXAM: CT FFR ANALYSIS  FINDINGS: CT FFR analysis was performed on the original cardiac computed tomography angiogram dataset. Diagrammatic representation of the CT FFR analysis is provided in a separate PDF  document in PACS. This dictation was created using the PDF document and an interactive 3D model of the results. The 3D model is not available in the EMR/PACS. Normal CT FFR range is >0.80.  1. Left Main: No significant stenosis.  2. LAD: No significant stenosis. 3. LCX: No significant stenosis. 4. RCA: No  significant stenosis.  IMPRESSION: 1.  CT FFR analysis didn't show any significant stenosis.   Electronically Signed By: Donato Schultz M.D. On: 12/04/2020 11:27   CT CORONARY MORPH W/CTA COR W/SCORE 12/03/2020  Addendum 12/03/2020  4:57 PM ADDENDUM REPORT: 12/03/2020 16:55  CLINICAL DATA:  82 year old with chest pain.  EXAM: Cardiac/Coronary  CTA  TECHNIQUE: The patient was scanned on a [employer].  FINDINGS: A 120 kV prospective scan was triggered in the descending thoracic aorta at 111 HU's. Axial non-contrast 3 mm slices were carried out through the heart. The data set was analyzed on a dedicated work station and scored using the Agatson method. Gantry rotation speed was 250 msecs and collimation was .6 mm. 0.8 mg of sl NTG was given. The 3D data set was reconstructed in 5% intervals of the 67-82 % of the R-R cycle. Diastolic phases were analyzed on a dedicated work station using MPR, MIP and VRT modes. The patient received 80 cc of contrast.  Aorta:  Normal size.  Mild calcifications.  No dissection.  Aortic Valve: No calcifications.  Coronary Arteries:  Normal coronary origin.  Right dominance.  RCA is a large dominant artery that gives rise to PDA and PLA. There is proximal calcified plaque, 25-49% stenosis.  Left main is a large artery that gives rise to LAD and LCX arteries.  LAD is a large vessel that has proximal calcified plaque, 0-24% stenosis, proximal large first diagonal calcified plaque, 25-49% stenosis.  LCX is a non-dominant artery that gives rise to one large OM1 branch. There is no plaque.  Other findings:  Normal pulmonary vein drainage into the left atrium.  Normal left atrial appendage without a thrombus.  Normal size of the pulmonary artery.  Please see radiology report for non cardiac findings.  IMPRESSION: 1. Coronary calcium score of 71 (LAD 50, RCA 21). This was 29 percentile for age and sex matched control.  2.  Normal coronary origin with right dominance.  3. Moderate CAD in LAD and RCA distribution. Will send for FFR analysis.  4.  Aortic atherosclerosis.  CAD-RADS 2. Mild non-obstructive CAD (25-49%). Consider non-atherosclerotic causes of chest pain. Consider preventive therapy and risk factor modification.   Electronically Signed By: Donato Schultz M.D. On: 12/03/2020 16:55  Narrative EXAM: OVER-READ INTERPRETATION  CT CHEST  The following report is an over-read performed by radiologist Dr. Trudie Reed of [facility] on 12/03/2020. This over-read does not include interpretation of cardiac or coronary anatomy or pathology. The coronary calcium score/coronary CTA interpretation by the cardiologist is attached.  COMPARISON:  No priors.  FINDINGS: Atherosclerotic calcifications in the thoracic aorta. Chronic scarring in the inferior segment of the lingula adjacent to multiple old healed left-sided rib fractures. Within the visualized portions of the thorax there are no suspicious appearing pulmonary nodules or masses, there is no acute consolidative airspace disease, no pleural effusions, no pneumothorax and no lymphadenopathy. Visualized portions of the upper abdomen are unremarkable. There are no aggressive appearing lytic or blastic lesions noted in the visualized portions of the skeleton. Multiple old healed left anterior rib fractures with mild chest wall deformity.  IMPRESSION: 1.  Aortic Atherosclerosis (ICD10-I70.0).  Electronically Signed: By: Trudie Reed M.D. On: 12/03/2020 16:32          Risk Assessment/Calculations:             Physical Exam:   VS:  BP 130/62   Pulse 81   Ht 5' 3.5" (1.613 m)   Wt 138 lb 1.6 oz (62.6 kg)   SpO2 97%   BMI 24.08 kg/m    Wt Readings from Last 3 Encounters:  03/06/23 138 lb 1.6 oz (62.6 kg)  12/06/22 136 lb (61.7 kg)  10/11/22 134 lb 12.8 oz (61.1 kg)    GEN: Well nourished, well developed in no  acute distress NECK: No JVD; No carotid bruits CARDIAC: RRR, no murmurs, rubs, gallops RESPIRATORY:  Clear to auscultation without rales, wheezing or rhonchi  ABDOMEN: Soft, non-tender, non-distended EXTREMITIES:  No edema; No deformity   ASSESSMENT AND PLAN: .    CAD - Moderate nonobstructive disease by cardiac CTA 11/2020 with no evidence of significant stenosis by FFR. No anginal symptoms. No indication for ischemic evaluation.  GDMT includes Plavix, simvastatin, Imdur, Repatha, PRN nitroglycerin.  Heart healthy diet and regular cardiovascular exercise encouraged.   Concerned about bruising with Plavix as attributes it to the delay between her breast cancer recurrence diagnosis and lumpectomy. Permissible to hold Plavix 5 days prior to procedures. Per her preference will adjust Plavix to 3x per week.    Hypokalemia - 11/2022 K 3.7.  Continue potassium supplement. BMP for monitoring of potassium.    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 11/2020 LE duplex no DVT.  No indication for anticoagulation.   HLD, LDL goal <70 - Did not tolerate Atorvastatin with myalgias. Tolerates Simvastatin once per week. Continue Repatha. 11/2022 LDL 44.    HTN - BP well controlled. Continue current antihypertensive regimen HCTZ 12.5mg  QD, Amlodipine 10mg  QD, Imdur 30mg  QHS.  Discussed to monitor BP at home at least 2 hours after medications and sitting for 5-10 minutes.    Hx of CVA - Continue Plavix, statin, Repatha. Adjust Plavix to 3x per week, as above.        Dispo: follow up in 6 months  Signed, Alver Sorrow, NP

## 2023-03-07 ENCOUNTER — Telehealth (HOSPITAL_BASED_OUTPATIENT_CLINIC_OR_DEPARTMENT_OTHER): Payer: Self-pay

## 2023-03-07 LAB — BASIC METABOLIC PANEL
BUN/Creatinine Ratio: 14 (ref 12–28)
BUN: 14 mg/dL (ref 8–27)
CO2: 24 mmol/L (ref 20–29)
Calcium: 9.4 mg/dL (ref 8.7–10.3)
Chloride: 102 mmol/L (ref 96–106)
Creatinine, Ser: 0.97 mg/dL (ref 0.57–1.00)
Glucose: 93 mg/dL (ref 70–99)
Potassium: 4.4 mmol/L (ref 3.5–5.2)
Sodium: 140 mmol/L (ref 134–144)
eGFR: 59 mL/min/{1.73_m2} — ABNORMAL LOW (ref >59)

## 2023-03-07 NOTE — Telephone Encounter (Signed)
-----   Message from Alver Sorrow sent at 03/07/2023  7:41 AM EST ----- Stable kidney function. Normal electrolytes. Good result!

## 2023-03-07 NOTE — Telephone Encounter (Signed)
Called and spoke to patient; discussed results. She was very thankful for call and verbalized understanding.  ----- Message from Alver Sorrow sent at 03/07/2023  7:41 AM EST ----- Stable kidney function. Normal electrolytes. Good result!

## 2023-03-24 ENCOUNTER — Other Ambulatory Visit: Payer: Self-pay | Admitting: Adult Health

## 2023-03-24 NOTE — Telephone Encounter (Signed)
 Okay for refill?

## 2023-05-05 ENCOUNTER — Other Ambulatory Visit: Payer: Self-pay | Admitting: Adult Health

## 2023-05-05 NOTE — Telephone Encounter (Signed)
 Okay for refill?

## 2023-05-17 ENCOUNTER — Other Ambulatory Visit: Payer: Self-pay | Admitting: Adult Health

## 2023-05-17 ENCOUNTER — Telehealth: Payer: Self-pay

## 2023-05-17 MED ORDER — CYCLOBENZAPRINE HCL 5 MG PO TABS
5.0000 mg | ORAL_TABLET | Freq: Three times a day (TID) | ORAL | 0 refills | Status: DC | PRN
Start: 2023-05-17 — End: 2023-05-31

## 2023-05-17 NOTE — Telephone Encounter (Signed)
 Copied from CRM [id number]. Topic: Clinical - Medication Question >> May 17, 2023 11:50 AM Orinda Kenner C wrote: Reason for CRM: Patient [phone] states back pain and needs a muscle relaxer medication. NP, Nafziger is aware patient has back issues.  Pain level range scale from 1-10, patient states pain level is at 7-8. Patient denies no fever.   [facility] [place], [place] -  [address] [place] [address] Phone:[phone]

## 2023-05-17 NOTE — Telephone Encounter (Signed)
 Patient notified of update  and verbalized understanding.

## 2023-05-17 NOTE — Telephone Encounter (Signed)
**Note De-identified  [place] Obfuscation** Please advise 

## 2023-05-30 ENCOUNTER — Other Ambulatory Visit: Payer: Self-pay | Admitting: Adult Health

## 2023-06-06 ENCOUNTER — Other Ambulatory Visit: Payer: Self-pay | Admitting: Adult Health

## 2023-06-06 DIAGNOSIS — F32A Depression, unspecified: Secondary | ICD-10-CM

## 2023-06-08 ENCOUNTER — Other Ambulatory Visit (HOSPITAL_BASED_OUTPATIENT_CLINIC_OR_DEPARTMENT_OTHER): Payer: Self-pay | Admitting: Family

## 2023-06-08 ENCOUNTER — Other Ambulatory Visit: Payer: Self-pay | Admitting: Adult Health

## 2023-06-08 DIAGNOSIS — E785 Hyperlipidemia, unspecified: Secondary | ICD-10-CM

## 2023-06-08 MED ORDER — TRAMADOL HCL 50 MG PO TABS: 50.0000 mg | ORAL_TABLET | Freq: Four times a day (QID) | ORAL | 0 refills | Status: DC | PRN

## 2023-06-08 NOTE — Telephone Encounter (Signed)
 Okay for refill? Look like she still has refills

## 2023-06-14 ENCOUNTER — Ambulatory Visit (HOSPITAL_BASED_OUTPATIENT_CLINIC_OR_DEPARTMENT_OTHER): Admitting: Orthopaedic Surgery

## 2023-06-15 ENCOUNTER — Encounter (HOSPITAL_BASED_OUTPATIENT_CLINIC_OR_DEPARTMENT_OTHER): Payer: Self-pay | Admitting: Student

## 2023-06-15 ENCOUNTER — Ambulatory Visit (HOSPITAL_BASED_OUTPATIENT_CLINIC_OR_DEPARTMENT_OTHER)

## 2023-06-15 ENCOUNTER — Ambulatory Visit (HOSPITAL_BASED_OUTPATIENT_CLINIC_OR_DEPARTMENT_OTHER): Admitting: Student

## 2023-06-15 DIAGNOSIS — M79605 Pain in left leg: Secondary | ICD-10-CM

## 2023-06-15 DIAGNOSIS — M79604 Pain in right leg: Secondary | ICD-10-CM

## 2023-06-15 DIAGNOSIS — M546 Pain in thoracic spine: Secondary | ICD-10-CM | POA: Diagnosis not present

## 2023-06-15 DIAGNOSIS — Z9889 Other specified postprocedural states: Secondary | ICD-10-CM

## 2023-06-15 DIAGNOSIS — G8929 Other chronic pain: Secondary | ICD-10-CM

## 2023-06-15 DIAGNOSIS — M25551 Pain in right hip: Secondary | ICD-10-CM

## 2023-06-15 DIAGNOSIS — M25552 Pain in left hip: Secondary | ICD-10-CM | POA: Diagnosis not present

## 2023-06-15 DIAGNOSIS — M545 Low back pain, unspecified: Secondary | ICD-10-CM | POA: Diagnosis not present

## 2023-06-15 DIAGNOSIS — M5442 Lumbago with sciatica, left side: Secondary | ICD-10-CM

## 2023-06-15 DIAGNOSIS — M5441 Lumbago with sciatica, right side: Secondary | ICD-10-CM

## 2023-06-15 DIAGNOSIS — M1711 Unilateral primary osteoarthritis, right knee: Secondary | ICD-10-CM

## 2023-06-15 MED ORDER — LIDOCAINE HCL 1 % IJ SOLN
4.0000 mL | INTRAMUSCULAR | Status: AC | PRN
  Administered 2023-06-15: 4 mL

## 2023-06-15 MED ORDER — TRIAMCINOLONE ACETONIDE 40 MG/ML IJ SUSP
2.0000 mL | INTRAMUSCULAR | Status: AC | PRN
  Administered 2023-06-15: 2 mL via INTRA_ARTICULAR
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
## 2023-06-17 ENCOUNTER — Other Ambulatory Visit: Payer: Self-pay | Admitting: Adult Health

## 2023-06-19 ENCOUNTER — Inpatient Hospital Stay: Payer: Medicare Other | Attending: Hematology and Oncology | Admitting: Hematology and Oncology

## 2023-06-19 NOTE — Assessment & Plan Note (Deleted)
 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 N0 M0 stage IA Oncotype DX score 9 7%ROR: Adjuvant radiation followed by 5 years of anastrozole)    Treatment plan: Adjuvant antiestrogen therapy with letrozole x 5 years Letrozole toxicities:  Kyphoplasty to the back X-rays of lumbar spine, hip: Pending Return to clinic in 1 year for follow-up

## 2023-06-20 NOTE — Telephone Encounter (Signed)
 Okay for refill?

## 2023-06-21 NOTE — Telephone Encounter (Signed)
 Pt stated that she does need the medication refilled

## 2023-07-24 ENCOUNTER — Telehealth: Payer: Self-pay

## 2023-07-24 ENCOUNTER — Other Ambulatory Visit: Payer: Self-pay | Admitting: Adult Health

## 2023-07-24 NOTE — Telephone Encounter (Signed)
 Copied from CRM [id number]. Topic: Clinical - Prescription Issue >> Jul 24, 2023  3:52 PM Alexandria [name] H wrote: Reason for CRM: Patient is calling following up on refill request for her traMADol  (ULTRAM ) 50 MG tablet, states she needs medication for pain she's having due to a fall in yard again last Thursday, patient declined being triaged states she's fine just having pain and soreness. Patient wants provider to know she has a home visit coming up and will see Randel Buss in September.  [name] [phone]

## 2023-07-24 NOTE — Telephone Encounter (Signed)
 Copied from CRM [id number]. Topic: Clinical - Prescription Issue >> Jul 24, 2023  3:52 PM Kita Perish H wrote: Reason for CRM: Patient is calling following up on refill request for her traMADol  (ULTRAM ) 50 MG tablet, states she needs medication for pain she's having due to a fall in yard again last Thursday, patient declined being triaged states she's fine just having pain and soreness. Patient wants provider to know she has a home visit coming up and will see Randel Buss in September.  [name] [phone]

## 2023-07-25 ENCOUNTER — Ambulatory Visit
Admission: RE | Admit: 2023-07-25 | Discharge: 2023-07-25 | Disposition: A | Discharge status: Home / Self Care | Source: Ambulatory Visit | Attending: Student | Admitting: Student

## 2023-07-25 ENCOUNTER — Ambulatory Visit
Admission: RE | Admit: 2023-07-25 | Discharge: 2023-07-25 | Disposition: A | Discharge status: Home / Self Care | Source: Ambulatory Visit | Attending: Student

## 2023-07-25 DIAGNOSIS — M1711 Unilateral primary osteoarthritis, right knee: Secondary | ICD-10-CM

## 2023-07-25 DIAGNOSIS — G8929 Other chronic pain: Secondary | ICD-10-CM

## 2023-07-28 ENCOUNTER — Telehealth: Payer: Self-pay | Admitting: Physical Medicine and Rehabilitation

## 2023-07-28 NOTE — Telephone Encounter (Signed)
 Pt states she had her mri on Wednesday and then she fell on Friday in the back yard and was told to contact Megan and let her know.  Pt request a sooner appointment if possible

## 2023-08-08 ENCOUNTER — Other Ambulatory Visit (HOSPITAL_BASED_OUTPATIENT_CLINIC_OR_DEPARTMENT_OTHER): Payer: Self-pay | Admitting: Family

## 2023-08-08 DIAGNOSIS — I25118 Atherosclerotic heart disease of native coronary artery with other forms of angina pectoris: Secondary | ICD-10-CM

## 2023-08-10 ENCOUNTER — Other Ambulatory Visit (HOSPITAL_BASED_OUTPATIENT_CLINIC_OR_DEPARTMENT_OTHER): Payer: Self-pay | Admitting: Family

## 2023-08-10 DIAGNOSIS — E785 Hyperlipidemia, unspecified: Secondary | ICD-10-CM

## 2023-08-15 ENCOUNTER — Ambulatory Visit: Admitting: Physical Medicine and Rehabilitation

## 2023-08-25 ENCOUNTER — Encounter: Payer: Self-pay | Admitting: Physical Medicine and Rehabilitation

## 2023-08-25 ENCOUNTER — Ambulatory Visit (INDEPENDENT_AMBULATORY_CARE_PROVIDER_SITE_OTHER): Admitting: Physical Medicine and Rehabilitation

## 2023-08-25 DIAGNOSIS — G8929 Other chronic pain: Secondary | ICD-10-CM | POA: Diagnosis not present

## 2023-08-25 DIAGNOSIS — Z9889 Other specified postprocedural states: Secondary | ICD-10-CM

## 2023-08-25 DIAGNOSIS — M7918 Myalgia, other site: Secondary | ICD-10-CM

## 2023-08-25 DIAGNOSIS — M545 Low back pain, unspecified: Secondary | ICD-10-CM | POA: Diagnosis not present

## 2023-08-25 DIAGNOSIS — M546 Pain in thoracic spine: Secondary | ICD-10-CM | POA: Diagnosis not present
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
## 2023-08-25 NOTE — Progress Notes (Unsigned)
 Pain Scale   Average Pain 6 Patient advising she has Chronic spine pain and she has had an MRI don x 3 weeks ago, patient advising he is here to review her MRI results.        +Driver, -BT, -Dye Allergies.

## 2023-08-28 ENCOUNTER — Ambulatory Visit (HOSPITAL_BASED_OUTPATIENT_CLINIC_OR_DEPARTMENT_OTHER): Payer: Medicare Other | Admitting: Cardiovascular Disease

## 2023-08-28 ENCOUNTER — Ambulatory Visit (HOSPITAL_BASED_OUTPATIENT_CLINIC_OR_DEPARTMENT_OTHER): Admitting: Cardiovascular Disease

## 2023-09-05 ENCOUNTER — Encounter: Payer: Self-pay | Admitting: Physical Medicine & Rehabilitation

## 2023-09-06 ENCOUNTER — Other Ambulatory Visit: Payer: Self-pay | Admitting: Adult Health

## 2023-09-06 NOTE — Telephone Encounter (Signed)
 Okay for refill?

## 2023-10-04 ENCOUNTER — Other Ambulatory Visit: Payer: Self-pay | Admitting: Adult Health

## 2023-10-04 MED ORDER — TRAMADOL HCL 50 MG PO TABS: 50.0000 mg | ORAL_TABLET | Freq: Four times a day (QID) | ORAL | 0 refills | Status: DC | PRN

## 2023-10-04 NOTE — Telephone Encounter (Signed)
 Spoke to pt and she stated that she does not see pain management until 10/11/2023. Pt stated she has one tab left.

## 2023-10-04 NOTE — Telephone Encounter (Signed)
 Okay for refill?

## 2023-10-04 NOTE — Telephone Encounter (Signed)
 Pt notified of update

## 2023-10-12 ENCOUNTER — Encounter: Payer: Self-pay | Admitting: Physical Medicine & Rehabilitation

## 2023-10-12 ENCOUNTER — Encounter: Attending: Physical Medicine & Rehabilitation | Admitting: Physical Medicine & Rehabilitation

## 2023-10-12 VITALS — BP 122/72 | HR 95 | Ht 63.5 in | Wt 134.8 lb

## 2023-10-12 DIAGNOSIS — M47816 Spondylosis without myelopathy or radiculopathy, lumbar region: Secondary | ICD-10-CM | POA: Diagnosis not present

## 2023-10-12 MED ORDER — TRAMADOL HCL 50 MG PO TABS: 50.0000 mg | ORAL_TABLET | Freq: Four times a day (QID) | ORAL | 0 refills | Status: DC | PRN

## 2023-10-12 NOTE — Patient Instructions (Signed)
 Go to [facility] for urine drug screen today , will review results when available next week
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
## 2023-10-15 LAB — TOXASSURE SELECT 13 (MW), URINE

## 2023-10-18 ENCOUNTER — Other Ambulatory Visit: Payer: Self-pay | Admitting: Physical Medicine & Rehabilitation

## 2023-10-18 ENCOUNTER — Other Ambulatory Visit: Payer: Self-pay | Admitting: Adult Health

## 2023-10-27 ENCOUNTER — Telehealth: Payer: Self-pay | Admitting: Physical Medicine & Rehabilitation

## 2023-10-27 NOTE — Telephone Encounter (Signed)
Patient needs a refill on Tramadol. 

## 2023-10-28 MED ORDER — TRAMADOL HCL 50 MG PO TABS: 50.0000 mg | ORAL_TABLET | Freq: Four times a day (QID) | ORAL | 0 refills | Status: DC | PRN

## 2023-10-31 LAB — HM MAMMOGRAPHY

## 2023-11-01 ENCOUNTER — Encounter: Payer: Self-pay | Admitting: Adult Health

## 2023-11-08 ENCOUNTER — Other Ambulatory Visit: Payer: Self-pay | Admitting: Adult Health

## 2023-11-08 ENCOUNTER — Other Ambulatory Visit: Payer: Self-pay | Admitting: Physical Medicine & Rehabilitation

## 2023-11-20 ENCOUNTER — Other Ambulatory Visit: Payer: Self-pay | Admitting: Physical Medicine & Rehabilitation

## 2023-11-21 ENCOUNTER — Ambulatory Visit (HOSPITAL_COMMUNITY)
Admission: RE | Admit: 2023-11-21 | Discharge: 2023-11-21 | Disposition: A | Discharge status: Home / Self Care | Source: Ambulatory Visit | Attending: Physical Medicine & Rehabilitation | Admitting: Physical Medicine & Rehabilitation

## 2023-11-21 ENCOUNTER — Encounter: Attending: Physical Medicine & Rehabilitation | Admitting: Physical Medicine & Rehabilitation

## 2023-11-21 ENCOUNTER — Encounter: Payer: Self-pay | Admitting: Physical Medicine & Rehabilitation

## 2023-11-21 VITALS — BP 158/93 | HR 74 | Ht 63.5 in | Wt 141.0 lb

## 2023-11-21 DIAGNOSIS — G8929 Other chronic pain: Secondary | ICD-10-CM | POA: Diagnosis present

## 2023-11-21 DIAGNOSIS — M545 Low back pain, unspecified: Secondary | ICD-10-CM | POA: Diagnosis present

## 2023-11-21 DIAGNOSIS — M25561 Pain in right knee: Secondary | ICD-10-CM | POA: Insufficient documentation

## 2023-11-21 MED ORDER — TRAMADOL HCL 50 MG PO TABS: 50.0000 mg | ORAL_TABLET | Freq: Two times a day (BID) | ORAL | 1 refills | Status: DC
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
## 2023-11-21 NOTE — Patient Instructions (Signed)
 Please get Xrays of your Lumbar and thoracic spine so we can rule out new compression fraction

## 2023-11-23 ENCOUNTER — Other Ambulatory Visit: Payer: Self-pay | Admitting: Obstetrics and Gynecology

## 2023-11-23 DIAGNOSIS — R1011 Right upper quadrant pain: Secondary | ICD-10-CM

## 2023-11-23 DIAGNOSIS — R079 Chest pain, unspecified: Secondary | ICD-10-CM

## 2023-11-30 ENCOUNTER — Ambulatory Visit
Admission: RE | Admit: 2023-11-30 | Discharge: 2023-11-30 | Disposition: A | Discharge status: Home / Self Care | Source: Ambulatory Visit | Attending: Obstetrics and Gynecology | Admitting: Obstetrics and Gynecology

## 2023-11-30 DIAGNOSIS — R1011 Right upper quadrant pain: Secondary | ICD-10-CM

## 2023-11-30 MED ORDER — DIPHENHYDRAMINE HCL 50 MG/ML IJ SOLN
25.0000 mg | Freq: Once | INTRAMUSCULAR | Status: AC
  Administered 2023-11-30: 25 mg via INTRAVENOUS

## 2023-11-30 MED ORDER — IOPAMIDOL (ISOVUE-370) INJECTION 76%
79.0000 mL | Freq: Once | INTRAVENOUS | Status: AC | PRN
  Administered 2023-11-30: 79 mL via INTRAVENOUS

## 2023-12-05 ENCOUNTER — Other Ambulatory Visit (HOSPITAL_BASED_OUTPATIENT_CLINIC_OR_DEPARTMENT_OTHER): Payer: Self-pay | Admitting: Cardiovascular Disease

## 2023-12-05 DIAGNOSIS — I25118 Atherosclerotic heart disease of native coronary artery with other forms of angina pectoris: Secondary | ICD-10-CM

## 2023-12-07 ENCOUNTER — Ambulatory Visit: Payer: Self-pay | Admitting: Adult Health

## 2023-12-07 ENCOUNTER — Ambulatory Visit: Payer: Medicare Other | Admitting: Adult Health

## 2023-12-07 ENCOUNTER — Encounter: Payer: Self-pay | Admitting: Adult Health

## 2023-12-07 ENCOUNTER — Other Ambulatory Visit: Payer: Self-pay | Admitting: Adult Health

## 2023-12-07 VITALS — BP 130/60 | HR 84 | Temp 98.5°F | Ht 62.5 in | Wt 141.0 lb

## 2023-12-07 DIAGNOSIS — F32A Depression, unspecified: Secondary | ICD-10-CM

## 2023-12-07 DIAGNOSIS — I2510 Atherosclerotic heart disease of native coronary artery without angina pectoris: Secondary | ICD-10-CM

## 2023-12-07 DIAGNOSIS — Z8673 Personal history of transient ischemic attack (TIA), and cerebral infarction without residual deficits: Secondary | ICD-10-CM

## 2023-12-07 DIAGNOSIS — F419 Anxiety disorder, unspecified: Secondary | ICD-10-CM

## 2023-12-07 DIAGNOSIS — M546 Pain in thoracic spine: Secondary | ICD-10-CM

## 2023-12-07 DIAGNOSIS — E785 Hyperlipidemia, unspecified: Secondary | ICD-10-CM

## 2023-12-07 DIAGNOSIS — I10 Essential (primary) hypertension: Secondary | ICD-10-CM

## 2023-12-07 DIAGNOSIS — Z0000 Encounter for general adult medical examination without abnormal findings: Secondary | ICD-10-CM

## 2023-12-07 DIAGNOSIS — G8929 Other chronic pain: Secondary | ICD-10-CM

## 2023-12-07 DIAGNOSIS — J4520 Mild intermittent asthma, uncomplicated: Secondary | ICD-10-CM

## 2023-12-07 DIAGNOSIS — K219 Gastro-esophageal reflux disease without esophagitis: Secondary | ICD-10-CM

## 2023-12-07 DIAGNOSIS — Z170 Estrogen receptor positive status [ER+]: Secondary | ICD-10-CM

## 2023-12-07 DIAGNOSIS — R7989 Other specified abnormal findings of blood chemistry: Secondary | ICD-10-CM

## 2023-12-07 DIAGNOSIS — Z0001 Encounter for general adult medical examination with abnormal findings: Secondary | ICD-10-CM

## 2023-12-07 DIAGNOSIS — C50211 Malignant neoplasm of upper-inner quadrant of right female breast: Secondary | ICD-10-CM

## 2023-12-07 LAB — CBC WITH DIFFERENTIAL/PLATELET
Basophils Absolute: 0.1 K/uL (ref 0.0–0.1)
Basophils Relative: 1 % (ref 0.0–3.0)
Eosinophils Absolute: 0.1 K/uL (ref 0.0–0.7)
Eosinophils Relative: 1.8 % (ref 0.0–5.0)
HCT: 38 % (ref 36.0–46.0)
Hemoglobin: 12.5 g/dL (ref 12.0–15.0)
Lymphocytes Relative: 32.4 % (ref 12.0–46.0)
Lymphs Abs: 2.4 K/uL (ref 0.7–4.0)
MCHC: 33 g/dL (ref 30.0–36.0)
MCV: 92.6 fl (ref 78.0–100.0)
Monocytes Absolute: 0.5 K/uL (ref 0.1–1.0)
Monocytes Relative: 7.4 % (ref 3.0–12.0)
Neutro Abs: 4.2 K/uL (ref 1.4–7.7)
Neutrophils Relative %: 57.4 % (ref 43.0–77.0)
Platelets: 257 K/uL (ref 150.0–400.0)
RBC: 4.1 Mil/uL (ref 3.87–5.11)
RDW: 13.7 % (ref 11.5–15.5)
WBC: 7.3 K/uL (ref 4.0–10.5)

## 2023-12-07 LAB — COMPREHENSIVE METABOLIC PANEL WITH GFR
ALT: 10 U/L (ref 0–35)
AST: 18 U/L (ref 0–37)
Albumin: 4.5 g/dL (ref 3.5–5.2)
Alkaline Phosphatase: 53 U/L (ref 39–117)
BUN: 19 mg/dL (ref 6–23)
CO2: 29 meq/L (ref 19–32)
Calcium: 9.8 mg/dL (ref 8.4–10.5)
Chloride: 102 meq/L (ref 96–112)
Creatinine, Ser: 0.93 mg/dL (ref 0.40–1.20)
GFR: 57.84 mL/min — ABNORMAL LOW (ref >60.00)
Glucose, Bld: 87 mg/dL (ref 70–99)
Potassium: 3.9 meq/L (ref 3.5–5.1)
Sodium: 141 meq/L (ref 135–145)
Total Bilirubin: 0.9 mg/dL (ref 0.2–1.2)
Total Protein: 7.4 g/dL (ref 6.0–8.3)

## 2023-12-07 LAB — LIPID PANEL
Cholesterol: 216 mg/dL — ABNORMAL HIGH (ref 0–200)
HDL: 65.3 mg/dL (ref >39.00)
LDL Cholesterol: 125 mg/dL — ABNORMAL HIGH (ref 0–99)
NonHDL: 150.31
Total CHOL/HDL Ratio: 3
Triglycerides: 127 mg/dL (ref 0.0–149.0)
VLDL: 25.4 mg/dL (ref 0.0–40.0)

## 2023-12-07 LAB — TSH: TSH: 8.06 u[IU]/mL — ABNORMAL HIGH (ref 0.35–5.50)

## 2023-12-07 MED ORDER — ESCITALOPRAM OXALATE 10 MG PO TABS: 10.0000 mg | ORAL_TABLET | Freq: Every day | ORAL | 3 refills | Status: AC

## 2023-12-07 MED ORDER — AMLODIPINE BESYLATE 10 MG PO TABS: 10.0000 mg | ORAL_TABLET | Freq: Every day | ORAL | 3 refills | Status: AC

## 2023-12-07 MED ORDER — PANTOPRAZOLE SODIUM 40 MG PO TBEC: 40.0000 mg | DELAYED_RELEASE_TABLET | Freq: Two times a day (BID) | ORAL | 3 refills | Status: AC

## 2023-12-07 MED ORDER — HYDROCHLOROTHIAZIDE 12.5 MG PO CAPS: 12.5000 mg | ORAL_CAPSULE | Freq: Every day | ORAL | 3 refills | Status: AC

## 2023-12-07 NOTE — Patient Instructions (Signed)
 It was great seeing you today   We will follow up with you regarding your lab work   Please let me know if you need anything

## 2023-12-07 NOTE — Progress Notes (Addendum)
 Subjective:    Patient ID: Donia LITTIE Dresser, female    DOB: 05-05-1940, 83 y.o.   MRN: [id number]  HPI Patient presents for yearly preventative medicine examination. 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  Essential hypertension-takes Norvasc  10 mg daily, Imdur  30 mg at bedtime, and HCTZ 12.5 mg twice daily.  She denies dizziness, lightheadedness, chest pain.  BP Readings from Last 3 Encounters:  12/07/23 130/60  11/21/23 (!) 158/93  10/12/23 122/72   CAD -she has moderate nonobstructive disease by cardiac CTA in September 2022 with no evidence of significant stenosis by FFR.  She denies chest pain or shortness of breath.  Management currently is Plavix , simvastatin , Imdur , and Repatha . Today she reports that she stopped Repatha  2 months ago due to side effects of lightheadedness and dizziness. She also stopped Simvastatin  at approximately the same time due to muscle aches - she reports that her symptoms improved since stopping these medications. She did not let Cardiology know that she has stopped these yet.   Hyperlipidemia- She has been intolerant to statins in the  past. She is currently managed with Repatha  140 mg every two weeks and simvastatin  once weekly.  Lab Results  Component Value Date   CHOL 139 12/06/2022   HDL 71.60 12/06/2022   LDLCALC 44 12/06/2022   LDLDIRECT 183.5 12/17/2008   TRIG 117.0 12/06/2022   CHOLHDL 2 12/06/2022   Anxiety/depression-takes Lexapro  10 mg daily and feels well controlled on this medication  H/o CVA -occurred in the setting of a fall back in 2010.  She is on Plavix  75 mg three times weekly   Asthma- former smoker, managed by pulmonary.  Currently prescribed Advair  2 puffs twice daily, Singulair  10 mg daily, and albuterol  inhaler as needed. She reports that her breathing has been stable with no acutr exacerbations.   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  She was on letrozole  but this caused significant side effects including insomnia, joint pain, nausea, hot flashes, diarrhea, constipation, hair loss.  She discontinued this on her own and her symptoms proved, was seen by oncology last being in December 2024 and was restarted on Anastrozole  which she tolerated in the past.   Chronic Back Pain -she has a history of T11 compression fracture.  She is seen by Physical Medicine and Rehab throughout the year. They have taken over pain management and she is currently prescribed Tramadol  50 mg BID  GERD - she takes Protonix  40 mg daily - she reports that this does not always help and she often has acid reflux if she does not eat. She denies n/v/d/c  All immunizations and health maintenance protocols were reviewed with the patient and needed orders  were placed.  Appropriate screening laboratory values were ordered for the patient including screening of hyperlipidemia, renal function and hepatic  function.   Medication reconciliation,  past medical history, social history, problem list and allergies were reviewed in detail with the patient  Goals were established with regard to weight loss, exercise, and  diet in compliance with medications. She continues to enjoy working in the yard and tries to eat healthy  Wt Readings from Last 3 Encounters:  12/07/23 141 lb (64 kg)  11/21/23 141 lb (64 kg)  10/12/23 134 lb 12.8 oz (61.1 kg)    Review of Systems  Constitutional: Negative.   HENT: Negative.    Eyes: Negative.   Respiratory: Negative.    Cardiovascular: Negative.   Gastrointestinal:  Positive for abdominal pain.  Endocrine: Negative.   Genitourinary: Negative.   Musculoskeletal:  Positive for arthralgias, back pain and gait problem.  Skin: Negative.   Allergic/Immunologic: Negative.   Hematological: Negative.   Psychiatric/Behavioral: Negative.     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 since age 49   IBS (irritable bowel syndrome)    Long term current use of aromatase inhibitor 05/08/2012   Melanoma (HCC) 2019   excised from nose early 2019    Overactive bladder    Palpitations    Pneumonia    hx of x 2    PONV (postoperative nausea and vomiting)    PVD (peripheral vascular disease)    varicose veins - left worse than right   Spinal stenosis    Stress incontinence    Stroke [facility])    problem with expressive communicaiton   Traumatic hemopneumothorax 1982   left    Social History   Socioeconomic History   Marital status: Widowed    Spouse name: Not on file   Number of children: Not on file   Years of education: Not on file   Highest education level: Not on file  Occupational History   Not on file  Tobacco Use   Smoking status: Former    Current packs/day: 0.00    Average packs/day: 0.5 packs/day for 15.0 years (7.5 ttl pk-yrs)    Types: Cigarettes    Start date: 08/29/1978    Quit date: 08/28/1993    Years since quitting: 30.2   Smokeless tobacco: Never  Vaping Use   Vaping status: Never Used  Substance and Sexual Activity   Alcohol  use: No    Alcohol/week: 0.0 standard drinks of alcohol   Drug use: No   Sexual activity: Not Currently  Other Topics Concern   Not on file  Social History Narrative   Widowed since 2014   Retired Set designer    Son and daughter   Social Drivers of Health   Financial Resource Strain: Low Risk  (05/25/2022)   Received from [facility]   Overall Financial Resource Strain (CARDIA)    Difficulty of Paying Living Expenses: Not hard at all  Food Insecurity: No Food Insecurity (08/23/2022)   Hunger Vital Sign    Worried About Running Out of Food in the Last Year: Never true    Ran Out of Food in the Last Year: Never true  Recent Concern: Food Insecurity - Food Insecurity Present (08/23/2022)   Hunger Vital Sign    Worried About Running Out of Food in the Last Year: Sometimes true    Ran Out of Food in the Last Year: Never true  Transportation Needs: Unknown (08/23/2022)   PRAPARE - Administrator, Civil Service (Medical): Not on file    Lack of Transportation  (Non-Medical): No  Physical Activity: Insufficiently Active (11/26/2020)   Exercise Vital Sign    Days of Exercise per Week: 3 days    Minutes of Exercise per Session: 30 min  Stress: Not on file  Social Connections: Unknown (07/15/2021)   Received from [facility]   Social Network    Social Network: Not on file  Intimate Partner Violence: Not At Risk (08/23/2022)   Humiliation, Afraid, Rape, and Kick questionnaire    Fear of Current or Ex-Partner: No    Emotionally Abused: No    Physically Abused: No    Sexually Abused: No    Past Surgical History:  Procedure Laterality Date   ANKLE HARDWARE REMOVAL     right   BREAST BIOPSY  1976   right   BREAST BIOPSY Left 2019   benign lymph node   BREAST BIOPSY Right 08/10/2022   MM RT BREAST BX W LOC DEV 1ST LESION IMAGE BX SPEC STEREO GUIDE 08/10/2022 [facility]   BREAST BIOPSY  09/27/2022   MM RT RADIOACTIVE SEED LOC MAMMO GUIDE 09/27/2022 [facility]   BREAST LUMPECTOMY  2013   right with snbx   BREAST LUMPECTOMY WITH RADIOACTIVE SEED LOCALIZATION Right 09/28/2022   Procedure: RIGHT BREAST LUMPECTOMY WITH RADIOACTIVE SEED LOCALIZATION;  Surgeon: Vanderbilt Ned, MD;  Location: [facility];  Service: General;  Laterality: Right;   CHOLECYSTECTOMY  1990s   COLONOSCOPY     CYSTOSCOPY  07/05/2005   KNEE SURGERY  1980s   left   KYPHOPLASTY N/A 06/15/2017   Procedure: KYPHOPLASTY T11;  Surgeon: Burnetta Aures, MD;  Location: [facility];  Service: Orthopedics;  Laterality: N/A;  90 mins   ORIF ANKLE FRACTURE  1980s   right    TOTAL KNEE ARTHROPLASTY Left 12/08/2015   Procedure: LEFT TOTAL KNEE ARTHROPLASTY;  Surgeon: Donnice Car, MD;  Location: [facility];  Service: Orthopedics;  Laterality: Left;   TRANSOBTURATOR SLING  07/05/2005   TUBAL LIGATION  1976   VEIN SURGERY     vein ablation left leg    Family History  Problem Relation Age of Onset   Stroke Mother    Heart attack Father        Died age 103   Breast cancer  Daughter 82       reportedly BRCA+  Breast cancer Maternal Aunt 26   Multiple myeloma Paternal Aunt 46 - 69   Breast cancer Paternal Aunt 26 - 65   Breast cancer Paternal Aunt 59 - 79       bilateral breast cancer   Dementia Paternal Aunt    Stomach cancer Paternal Uncle 19 - 58   Breast cancer Paternal Uncle 5 - 69   Congestive Heart Failure Paternal Uncle        Ischemic   Congestive Heart Failure Paternal Uncle        Ischemic   Heart attack Paternal Grandfather    Breast cancer Cousin        3 maternal first cousins    Allergies  Allergen Reactions   Augmentin [Amoxicillin-Pot Clavulanate] Itching, Rash and Other (See Comments)    PT DEVELOPED SEVERE RASH INVOLVING MUCUS MEMBRANES or SKIN NECROSIS WITH PENICILLINS: #  #  #  YES  #  #  #     Penicillins Hives and Other (See Comments)    Has patient had a PCN reaction causing immediate rash, facial/tongue/throat swelling, SOB or lightheadedness with hypotension: No HAS PT DEVELOPED SEVERE RASH INVOLVING MUCUS MEMBRANES or SKIN NECROSIS: #  #  #  YES  #  #  #   Has patient had a PCN reaction that required hospitalization: No Has patient had a PCN reaction occurring within the last 10 years: No    Aspirin Hives   Chlorhexidine  Gluconate Rash   Chocolate Hives and Other (See Comments)    RUNNY NOSE    Coffee Bean Extract Hives and Other (See Comments)    RUNNY NOSE    Diclofenac Other (See Comments)   Ibuprofen Hives and Other (See Comments)    RUNNY NOSE   Oxycodone Hcl Hives and Nausea And Vomiting   Shrimp [Shellfish Allergy] Hives and Other (See Comments)    RUNNY NOSE    Sulfonamide Derivatives Hives   Iodinated Contrast Media Itching    11/30/23 pt developed facial itching after given CT contrast. Pt will need 13 hr prep for future CT scans with contrast   Lipitor [Atorvastatin  Calcium ]     Leg Pain/Joint Pain/Back Pain   Methocarbamol  Other (See Comments)   Simvastatin      Myalgia     Current Outpatient  Medications on File Prior to Visit  Medication Sig Dispense Refill   acetaminophen  (TYLENOL ) 650 MG CR tablet Take 650 mg by mouth every 8 (eight) hours as needed for pain.     albuterol  (PROAIR  HFA) 108 (90 Base) MCG/ACT inhaler Inhale 2 puffs into the lungs every 6 (six) hours as needed for wheezing or shortness of breath. 8 g 5   amLODipine  (NORVASC ) 10 MG tablet TAKE ONE TABLET EVERY DAY 90 tablet 1   anastrozole  (ARIMIDEX ) 1 MG tablet Take 1 tablet (1 mg total) by mouth daily. 90 tablet 3   ARNICA EX Apply 1 application topically daily as needed (pain).     b complex vitamins tablet Take 1 tablet by mouth 2 (two) times daily. PLUS FOLIC ACID PLUS VIT. C     Biotin 5000 MCG CAPS Take 5,000 mcg by mouth daily.      Capsaicin 0.025 % PADS Apply 1 application topically daily as needed (pain).     Cholecalciferol  (VITAMIN D -3) 5000 UNITS TABS Take 5,000 Units by mouth daily.      clopidogrel  (PLAVIX ) 75 MG tablet Take 1 tablet (75 mg total) by mouth 3 (three) times a week.  40 tablet 3   CO-ENZYME Q10-VITAMIN E PO Take 1 tablet by mouth daily.     cyclobenzaprine  (FLEXERIL ) 5 MG tablet TAKE ONE TABLET BY MOUTH THREE TIMES DAILY AS NEEDED FOR MUSCLE SPASMS 30 tablet 0   diphenhydrAMINE  (BENADRYL ) 25 MG tablet Take 25 mg by mouth every 6 (six) hours as needed. Patient taking 1/2 (12.5 mg) as needed for allergies and itching     escitalopram  (LEXAPRO ) 10 MG tablet TAKE ONE TABLET BY MOUTH ONCE DAILY 90 tablet 0   fluticasone  (FLONASE ) 50 MCG/ACT nasal spray Place 1 spray into both nostrils daily. 16 g 3   fluticasone -salmeterol (ADVAIR  HFA) 230-21 MCG/ACT inhaler Inhale 2 puffs into the lungs 2 times daily. 12 g 0   hydrochlorothiazide  (MICROZIDE ) 12.5 MG capsule TAKE (1) CAPSULE TWICE DAILY. 180 capsule 3   ipratropium (ATROVENT ) 0.03 % nasal spray Place 2 sprays into both nostrils every 12 (twelve) hours. 30 mL 0   isosorbide  mononitrate (IMDUR ) 30 MG 24 hr tablet TAKE ONE TABLET BY MOUTH AT  BEDTIME 90 tablet 0   loratadine  (CLARITIN ) 10 MG tablet Take 10 mg by mouth daily as needed for allergies.     Misc Natural Products (OSTEO BI-FLEX ADV TRIPLE ST PO) Take 1 tablet by mouth daily.     montelukast  (SINGULAIR ) 10 MG tablet Take 1 tablet (10 mg total) by mouth at bedtime. 30 tablet 3   nitroGLYCERIN  (NITROSTAT ) 0.4 MG SL tablet Place 1 tablet (0.4 mg total) under the tongue every 5 (five) minutes as needed for chest pain. 25 tablet 3   pantoprazole  (PROTONIX ) 40 MG tablet TAKE ONE TABLET BY MOUTH DAILY 90 tablet 3   Polyethyl Glycol-Propyl Glycol (SYSTANE OP) Place 1 drop into both eyes daily as needed. Dry eyes     potassium chloride  (KLOR-CON ) 10 MEQ tablet Take 2 tablets (20 mEq total) by mouth 2 (two) times daily. 360 tablet 0   Probiotic Product (ALIGN PO) Take 1 tablet by mouth daily.     Selenium  200 MCG CAPS Take 200 mcg by mouth 2 (two) times daily.      traMADol  (ULTRAM ) 50 MG tablet Take 1 tablet (50 mg total) by mouth 2 (two) times daily. 30 tablet 1   Evolocumab  (REPATHA  SURECLICK) 140 MG/ML SOAJ INJECT 1 PEN SUBCUTANEOUSLY  EVERY 2 WEEKS (Patient not taking: Reported on 12/07/2023) 6 mL 1   ketoconazole (NIZORAL) 2 % shampoo Apply 1 Application topically.     Omega-3 Fatty Acids (FISH OIL) 1000 MG CAPS Take 1 capsule by mouth.     No current facility-administered medications on file prior to visit.    BP 130/60   Pulse 84   Temp 98.5 F (36.9 C) (Oral)   Ht 5' 2.5 (1.588 m)   Wt 141 lb (64 kg)   SpO2 94%   BMI 25.38 kg/m       Objective:   Physical Exam Vitals and nursing note reviewed.  Constitutional:      Appearance: Normal appearance.  Cardiovascular:     Rate and Rhythm: Normal rate and regular rhythm.     Pulses: Normal pulses.     Heart sounds: Normal heart sounds.  Pulmonary:     Effort: Pulmonary effort is normal.     Breath sounds: Normal breath sounds.  Abdominal:     General: Abdomen is flat. Bowel sounds are normal. There is no  distension.     Palpations: Abdomen is soft.     Tenderness: There is abdominal  tenderness in the epigastric area.  Musculoskeletal:        General: Normal range of motion.  Skin:    General: Skin is warm and dry.  Neurological:     General: No focal deficit present.     Mental Status: She is alert and oriented to person, place, and time.  Psychiatric:        Mood and Affect: Mood normal.        Behavior: Behavior normal.        Thought Content: Thought content normal.        Judgment: Judgment normal.        Assessment & Plan:  1. Routine general medical examination at a health care facility (Primary) Today patient counseled on age appropriate routine health concerns for screening and prevention, each reviewed and up to date or declined. Immunizations reviewed and up to date or declined. Labs ordered and reviewed. Risk factors for depression reviewed and negative. Hearing function and visual acuity are intact. ADLs screened and addressed as needed. Functional ability and level of safety reviewed and appropriate. Education, counseling and referrals performed based on assessed risks today. Patient provided with a copy of personalized plan for preventive services. - Follow up in one year or sooner if needed   2. Essential hypertension, benign - Controlled. No change in medication  - CBC with Differential/Platelet; Future - Comprehensive metabolic panel with GFR; Future - Lipid panel; Future - TSH; Future - amLODipine  (NORVASC ) 10 MG tablet; Take 1 tablet (10 mg total) by mouth daily.  Dispense: 90 tablet; Refill: 3 - hydrochlorothiazide  (MICROZIDE ) 12.5 MG capsule; Take 1 capsule (12.5 mg total) by mouth daily.  Dispense: 90 capsule; Refill: 3 - TSH - Lipid panel - Comprehensive metabolic panel with GFR - CBC with Differential/Platelet  3. CAD in native artery - Advised to follow up with Cardiology since she has been intolerant to multiple statins and now Repatha   - CBC with  Differential/Platelet; Future - Comprehensive metabolic panel with GFR; Future - Lipid panel; Future - TSH; Future - TSH - Lipid panel - Comprehensive metabolic panel with GFR - CBC with Differential/Platelet  4. Hyperlipidemia, unspecified hyperlipidemia type - Follow up with Cardiology  - CBC with Differential/Platelet; Future - Comprehensive metabolic panel with GFR; Future - Lipid panel; Future - TSH; Future - TSH - Lipid panel - Comprehensive metabolic panel with GFR - CBC with Differential/Platelet  5. Anxiety and depression - Doing well with lexapro   - CBC with Differential/Platelet; Future - Comprehensive metabolic panel with GFR; Future - Lipid panel; Future - TSH; Future - escitalopram  (LEXAPRO ) 10 MG tablet; Take 1 tablet (10 mg total) by mouth daily.  Dispense: 90 tablet; Refill: 3 - TSH - Lipid panel - Comprehensive metabolic panel with GFR - CBC with Differential/Platelet  6. History of CVA (cerebrovascular accident)  - CBC with Differential/Platelet; Future - Comprehensive metabolic panel with GFR; Future - Lipid panel; Future - TSH; Future - TSH - Lipid panel - Comprehensive metabolic panel with GFR - CBC with Differential/Platelet  7. Mild intermittent asthma without complication - Continue with inhalers and Singulair . Follow up with pulmonary as directed   8. Malignant neoplasm of upper-inner quadrant of right breast in female, estrogen receptor positive (HCC) - Per oncology  - CBC with Differential/Platelet; Future - Comprehensive metabolic panel with GFR; Future - Lipid panel; Future - TSH; Future - TSH - Lipid panel - Comprehensive metabolic panel with GFR - CBC with Differential/Platelet  9. Chronic midline thoracic  back pain - Per Physical medicine and rehab  - CBC with Differential/Platelet; Future - Comprehensive metabolic panel with GFR; Future - Lipid panel; Future - TSH; Future - TSH - Lipid panel - Comprehensive metabolic  panel with GFR - CBC with Differential/Platelet  10. Gastroesophageal reflux disease - Will increase Protonix  to 40 mg BID - Follow up if not improving as we will likely need to refer to GI  - pantoprazole  (PROTONIX ) 40 MG tablet; Take 1 tablet (40 mg total) by mouth 2 (two) times daily.  Dispense: 180 tablet; Refill: 3  Ilo Beamon, NP

## 2023-12-11 ENCOUNTER — Other Ambulatory Visit

## 2023-12-11 DIAGNOSIS — R7989 Other specified abnormal findings of blood chemistry: Secondary | ICD-10-CM

## 2023-12-11 DIAGNOSIS — R946 Abnormal results of thyroid function studies: Secondary | ICD-10-CM

## 2023-12-11 LAB — T4, FREE: Free T4: 0.77 ng/dL (ref 0.60–1.60)

## 2023-12-11 LAB — T3, FREE: T3, Free: 2.5 pg/mL (ref 2.3–4.2)

## 2023-12-11 LAB — TSH: TSH: 4.56 u[IU]/mL (ref 0.35–5.50)

## 2023-12-12 ENCOUNTER — Ambulatory Visit: Payer: Self-pay | Admitting: Adult Health

## 2023-12-14 ENCOUNTER — Ambulatory Visit (INDEPENDENT_AMBULATORY_CARE_PROVIDER_SITE_OTHER): Admitting: Pulmonary Disease

## 2023-12-14 ENCOUNTER — Encounter: Payer: Self-pay | Admitting: Pulmonary Disease

## 2023-12-14 VITALS — BP 144/79 | HR 82 | Ht 62.5 in | Wt 141.0 lb

## 2023-12-14 DIAGNOSIS — J4530 Mild persistent asthma, uncomplicated: Secondary | ICD-10-CM | POA: Diagnosis not present

## 2023-12-14 DIAGNOSIS — R0982 Postnasal drip: Secondary | ICD-10-CM | POA: Diagnosis not present

## 2023-12-14 DIAGNOSIS — Z91041 Radiographic dye allergy status: Secondary | ICD-10-CM

## 2023-12-14 DIAGNOSIS — T7840XA Allergy, unspecified, initial encounter: Secondary | ICD-10-CM

## 2023-12-14 DIAGNOSIS — J309 Allergic rhinitis, unspecified: Secondary | ICD-10-CM | POA: Diagnosis not present

## 2023-12-14 MED ORDER — MONTELUKAST SODIUM 10 MG PO TABS: 10.0000 mg | ORAL_TABLET | Freq: Every day | ORAL | 11 refills | Status: AC

## 2023-12-14 MED ORDER — IPRATROPIUM BROMIDE 0.03 % NA SOLN: 2.0000 | Freq: Two times a day (BID) | NASAL | 11 refills | Status: DC

## 2023-12-14 MED ORDER — FLUTICASONE PROPIONATE 50 MCG/ACT NA SUSP: 1.0000 | Freq: Every day | NASAL | 3 refills | Status: DC

## 2023-12-14 MED ORDER — FLUTICASONE-SALMETEROL 230-21 MCG/ACT IN AERO: 2.0000 | INHALATION_SPRAY | Freq: Two times a day (BID) | RESPIRATORY_TRACT | 11 refills | Status: DC

## 2023-12-14 MED ORDER — NEFFY 2 MG/0.1ML NA SOLN: 1.0000 | NASAL | 0 refills | Status: AC | PRN

## 2023-12-14 NOTE — Progress Notes (Signed)
 Synopsis: Self referred in 03/2020 for COPD  Subjective:   PATIENT ID: Donia LITTIE Dresser GENDER: female DOB: Aug 08, 1940, MRN: [id number]  HPI  Chief Complaint  Patient presents with   Medical Management of Chronic Issues    Pt states can no long do ct's w/ contrast due to having reaction to dye    Chinita Schimpf is a 83 year old woman, former smoker with history of asthma who returns to pulmonary clinic for follow up.  OV 07/07/20 She had pulmonary function tests on 05/26/20 which were normal. 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   She also complains of sinus congestion and pressure. She is using advair  230-26mcg 2 puffs twice daily and as needed albuterol .   She is planning to have surgery for a rectocele but this has been delayed due to her recent fall.  OV 08/15/22 She has been having rhinitis over the past few weeks. She is using advair  230-21mcg 2 puffs twice daily and as needed albuterol . Her daughter recently died from breast cancer.   OV 12/24/23 She experiences increased breathing difficulties, particularly with changes in weather and humidity. Allergen exposure worsens her symptoms, and she suspects new allergies. She uses Advair  inhaler two puffs twice daily and has used albuterol  three times in the past year for asthma exacerbations. There is no nocturnal cough, wheezing, or shortness of breath, but she has chronic sleep issues.  A recent allergic reaction to contrast during a CT scan caused facial redness, burning, and splotches. She was treated with 75 mg of Benadryl  and possibly steroids, without hospitalization. Her caregiver observed red splotches behind her ears and around her face. She is concerned about managing future allergic reactions and inquires about obtaining an epinephrine  nasal spray for emergencies. She  does not currently see an allergist or immunologist.  She experiences nasal drainage, particularly with weather changes and high humidity. She uses Singulair  at night, Claritin  in the morning, and continues with Flonase  and Atrovent  nasal sprays, which help clear her sinuses and improve breathing.   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 since age 52   IBS (irritable bowel syndrome)    Long term current use of aromatase inhibitor 05/08/2012   Melanoma (HCC) 2019  excised from nose early 2019   Overactive bladder    Palpitations    Pneumonia    hx of x 2    PONV (postoperative nausea and vomiting)    PVD (peripheral vascular disease)    varicose veins - left worse than right   Spinal stenosis    Stress incontinence    Stroke [facility])    problem with expressive communicaiton   Traumatic hemopneumothorax 1982   left     Family History  Problem Relation Age of Onset   Stroke Mother    Heart attack Father        Died age 37   Breast cancer Daughter 63       reportedly BRCA+   Breast cancer Maternal Aunt 35   Multiple myeloma Paternal Aunt 35 - 69   Breast cancer  Paternal Aunt 13 - 65   Breast cancer Paternal Aunt 30 - 79       bilateral breast cancer   Dementia Paternal Aunt    Stomach cancer Paternal Uncle 13 - 50   Breast cancer Paternal Uncle 51 - 69   Congestive Heart Failure Paternal Uncle        Ischemic   Congestive Heart Failure Paternal Uncle        Ischemic   Heart attack Paternal Grandfather    Breast cancer Cousin        3 maternal first cousins     Social History   Socioeconomic History   Marital status: Widowed    Spouse name: Not on file   Number of children: Not on file   Years of education: Not on file   Highest education level: Not on file  Occupational History   Not on file  Tobacco Use   Smoking status: Former    Current packs/day: 0.00    Average packs/day: 0.5 packs/day for 15.0 years (7.5 ttl pk-yrs)    Types: Cigarettes    Start date: 08/29/1978    Quit date: 08/28/1993    Years since quitting: 30.3   Smokeless tobacco: Never  Vaping Use   Vaping status: Never Used  Substance and Sexual Activity   Alcohol use: No    Alcohol/week: 0.0 standard drinks of alcohol   Drug use: No   Sexual activity: Not Currently  Other Topics Concern   Not on file  Social History Narrative   Widowed since 2014   Retired Set designer    Son and daughter   Social Drivers of Health   Financial Resource Strain: Low Risk  (05/25/2022)   Received from [facility]   Overall Financial Resource Strain (CARDIA)    Difficulty of Paying Living Expenses: Not hard at all  Food Insecurity: No Food Insecurity (08/23/2022)   Hunger Vital Sign    Worried About Running Out of Food in the Last Year: Never true    Ran Out of Food in the Last Year: Never true  Recent Concern: Food Insecurity - Food Insecurity Present (08/23/2022)   Hunger Vital Sign    Worried About Running Out of Food in the Last Year: Sometimes true    Ran Out of Food in the Last Year: Never true  Transportation Needs: Unknown (08/23/2022)   PRAPARE - Therapist, art (Medical): Not on file    Lack of Transportation (Non-Medical): No  Physical Activity: Insufficiently Active (11/26/2020)   Exercise Vital Sign    Days of Exercise per Week: 3 days  Minutes of Exercise per Session: 30 min  Stress: Not on file  Social Connections: Unknown (07/15/2021)   Received from [facility]   Social Network    Social Network: Not on file  Intimate Partner Violence: Not At Risk (08/23/2022)   Humiliation, Afraid, Rape, and Kick questionnaire    Fear of Current or Ex-Partner: No    Emotionally Abused: No    Physically Abused: No    Sexually Abused: No     Allergies  Allergen Reactions   Augmentin [Amoxicillin-Pot Clavulanate] Itching, Rash and Other (See Comments)    PT DEVELOPED SEVERE RASH INVOLVING MUCUS MEMBRANES or SKIN NECROSIS WITH PENICILLINS: #  #  #  YES  #  #  #     Penicillins Hives and Other (See Comments)    Has patient had a PCN reaction causing immediate rash, facial/tongue/throat swelling, SOB or lightheadedness with hypotension: No HAS PT DEVELOPED SEVERE RASH INVOLVING MUCUS MEMBRANES or SKIN NECROSIS: #  #  #  YES  #  #  #   Has patient had a PCN reaction that required hospitalization: No Has patient had a PCN reaction occurring within the last 10 years: No    Aspirin Hives   Chlorhexidine  Gluconate Rash   Chocolate Hives and Other (See Comments)    RUNNY NOSE    Coffee Bean Extract Hives and Other (See Comments)    RUNNY NOSE    Diclofenac Other (See Comments)   Ibuprofen Hives and Other (See Comments)    RUNNY NOSE   Oxycodone Hcl Hives and Nausea And Vomiting   Shrimp [Shellfish Allergy] Hives and Other (See Comments)    RUNNY NOSE    Sulfonamide Derivatives Hives   Iodinated Contrast Media Itching    11/30/23 pt developed facial itching after given CT contrast. Pt will need 13 hr prep for future CT scans with contrast   Lipitor [Atorvastatin  Calcium ]     Leg Pain/Joint Pain/Back Pain   Methocarbamol   Other (See Comments)   Simvastatin      Myalgia      Outpatient Medications Prior to Visit  Medication Sig Dispense Refill   acetaminophen  (TYLENOL ) 650 MG CR tablet Take 650 mg by mouth every 8 (eight) hours as needed for pain.     albuterol  (PROAIR  HFA) 108 (90 Base) MCG/ACT inhaler Inhale 2 puffs into the lungs every 6 (six) hours as needed for wheezing or shortness of breath. 8 g 5   amLODipine  (NORVASC ) 10 MG tablet Take 1 tablet (10 mg total) by mouth daily. 90 tablet 3   anastrozole  (ARIMIDEX ) 1 MG tablet Take 1 tablet (1 mg total) by mouth daily. 90 tablet 3   ARNICA EX Apply 1 application topically daily as needed (pain).     b complex vitamins tablet Take 1 tablet by mouth 2 (two) times daily. PLUS FOLIC ACID PLUS VIT. C     Biotin 5000 MCG CAPS Take 5,000 mcg by mouth daily.      Capsaicin 0.025 % PADS Apply 1 application topically daily as needed (pain).     Cholecalciferol  (VITAMIN D -3) 5000 UNITS TABS Take 5,000 Units by mouth daily.      clopidogrel  (PLAVIX ) 75 MG tablet Take 1 tablet (75 mg total) by mouth 3 (three) times a week. 40 tablet 3   CO-ENZYME Q10-VITAMIN E PO Take 1 tablet by mouth daily.     cyclobenzaprine  (FLEXERIL ) 5 MG tablet TAKE ONE TABLET BY MOUTH THREE TIMES DAILY AS NEEDED FOR MUSCLE SPASMS  30 tablet 0   escitalopram  (LEXAPRO ) 10 MG tablet Take 1 tablet (10 mg total) by mouth daily. 90 tablet 3   Evolocumab  (REPATHA  SURECLICK) 140 MG/ML SOAJ INJECT 1 PEN SUBCUTANEOUSLY  EVERY 2 WEEKS 6 mL 1   hydrochlorothiazide  (MICROZIDE ) 12.5 MG capsule Take 1 capsule (12.5 mg total) by mouth daily. 90 capsule 3   isosorbide  mononitrate (IMDUR ) 30 MG 24 hr tablet TAKE ONE TABLET BY MOUTH AT BEDTIME 90 tablet 0   ketoconazole (NIZORAL) 2 % shampoo Apply 1 Application topically.     loratadine  (CLARITIN ) 10 MG tablet Take 10 mg by mouth daily as needed for allergies.     Misc Natural Products (OSTEO BI-FLEX ADV TRIPLE ST PO) Take 1 tablet by mouth daily.      nitroGLYCERIN  (NITROSTAT ) 0.4 MG SL tablet Place 1 tablet (0.4 mg total) under the tongue every 5 (five) minutes as needed for chest pain. 25 tablet 3   Omega-3 Fatty Acids (FISH OIL) 1000 MG CAPS Take 1 capsule by mouth.     pantoprazole  (PROTONIX ) 40 MG tablet Take 1 tablet (40 mg total) by mouth 2 (two) times daily. 180 tablet 3   Polyethyl Glycol-Propyl Glycol (SYSTANE OP) Place 1 drop into both eyes daily as needed. Dry eyes     potassium chloride  (KLOR-CON ) 10 MEQ tablet Take 2 tablets (20 mEq total) by mouth 2 (two) times daily. 360 tablet 0   Probiotic Product (ALIGN PO) Take 1 tablet by mouth daily.     Selenium  200 MCG CAPS Take 200 mcg by mouth 2 (two) times daily.      traMADol  (ULTRAM ) 50 MG tablet Take 1 tablet (50 mg total) by mouth 2 (two) times daily. 30 tablet 1   fluticasone  (FLONASE ) 50 MCG/ACT nasal spray Place 1 spray into both nostrils daily. 16 g 3   fluticasone -salmeterol (ADVAIR  HFA) 230-21 MCG/ACT inhaler Inhale 2 puffs into the lungs 2 times daily. 12 g 0   ipratropium (ATROVENT ) 0.03 % nasal spray Place 2 sprays into both nostrils every 12 (twelve) hours. 30 mL 0   montelukast  (SINGULAIR ) 10 MG tablet Take 1 tablet (10 mg total) by mouth at bedtime. 30 tablet 3   No facility-administered medications prior to visit.    Review of Systems  Constitutional:  Negative for chills, fever, malaise/fatigue and weight loss.  HENT:  Positive for congestion.   Respiratory:  Negative for cough, hemoptysis, sputum production, shortness of breath, wheezing and stridor.   Cardiovascular:  Negative for chest pain, palpitations, orthopnea, claudication, leg swelling and PND.  Gastrointestinal:  Negative for abdominal pain, constipation, diarrhea, heartburn, nausea and vomiting.  Genitourinary: Negative.   Musculoskeletal: Negative.   Skin:  Negative for rash.  Neurological: Negative.   Endo/Heme/Allergies: Negative.   Psychiatric/Behavioral: Negative.     Objective:   Vitals:    12/14/23 0841  BP: (!) 144/79  Pulse: 82  SpO2: 99%  Weight: 141 lb (64 kg)  Height: 5' 2.5 (1.588 m)   Physical Exam Constitutional:      General: She is not in acute distress.    Appearance: She is normal weight. She is not ill-appearing.  HENT:     Head: Normocephalic and atraumatic.  Eyes:     General: No scleral icterus.    Conjunctiva/sclera: Conjunctivae normal.  Cardiovascular:     Rate and Rhythm: Normal rate and regular rhythm.     Pulses: Normal pulses.     Heart sounds: Normal heart sounds. No murmur heard. Pulmonary:  Effort: Pulmonary effort is normal.     Breath sounds: No wheezing, rhonchi or rales.  Musculoskeletal:     Right lower leg: No edema.     Left lower leg: No edema.  Neurological:     Mental Status: She is alert.     CBC    Component Value Date/Time   WBC 7.3 12/07/2023 0816   RBC 4.10 12/07/2023 0816   HGB 12.5 12/07/2023 0816   HGB 11.2 (L) 08/17/2022 1208   HGB CANCELED 08/17/2022 0000   HGB 13.1 10/06/2014 1036   HCT 38.0 12/07/2023 0816   HCT CANCELED 08/17/2022 0000   HCT 38.7 10/06/2014 1036   PLT 257.0 12/07/2023 0816   PLT 243 08/17/2022 1208   PLT CANCELED 08/17/2022 0000   MCV 92.6 12/07/2023 0816   MCV WILL FOLLOW 07/27/2017 1647   MCV 89.5 10/06/2014 1036   MCH 30.1 08/17/2022 1208   MCHC 33.0 12/07/2023 0816   RDW 13.7 12/07/2023 0816   RDW WILL FOLLOW 07/27/2017 1647   RDW 14.1 10/06/2014 1036   LYMPHSABS 2.4 12/07/2023 0816   LYMPHSABS WILL FOLLOW 07/27/2017 1647   LYMPHSABS 1.8 10/06/2014 1036   MONOABS 0.5 12/07/2023 0816   MONOABS 0.5 10/06/2014 1036   EOSABS 0.1 12/07/2023 0816   EOSABS WILL FOLLOW 07/27/2017 1647   BASOSABS 0.1 12/07/2023 0816   BASOSABS WILL FOLLOW 07/27/2017 1647   BASOSABS 0.1 10/06/2014 1036      Latest Ref Rng & Units 12/07/2023    8:16 AM 03/06/2023    3:28 PM 12/06/2022    8:22 AM  BMP  Glucose 70 - 99 mg/dL 87  93  95   BUN 6 - 23 mg/dL 19  14  17    Creatinine 0.40 -  1.20 mg/dL 9.06  9.02  9.12   BUN/Creat Ratio 12 - 28  14    Sodium 135 - 145 mEq/L 141  140  142   Potassium 3.5 - 5.1 mEq/L 3.9  4.4  3.7   Chloride 96 - 112 mEq/L 102  102  103   CO2 19 - 32 mEq/L 29  24  30    Calcium  8.4 - 10.5 mg/dL 9.8  9.4  9.7    Chest imaging: CXR 02/13/20 The lungs are clear. Heart size is normal. No pneumothorax or pleural effusion. Aortic atherosclerosis. Surgical clips projecting over the right breast and chronic deformity of left ribs again seen.  PFT:    Latest Ref Rng & Units 05/26/2020   11:50 AM  PFT Results  FVC-Pre L 2.41   FVC-Predicted Pre % 90   FVC-Post L 2.49   FVC-Predicted Post % 93   Pre FEV1/FVC % % 81   Post FEV1/FCV % % 80   FEV1-Pre L 1.95   FEV1-Predicted Pre % 97   FEV1-Post L 1.99   DLCO uncorrected ml/min/mmHg 16.91   DLCO UNC% % 90   DLCO corrected ml/min/mmHg 16.91   DLCO COR %Predicted % 90   DLVA Predicted % 104   TLC L 4.79   TLC % Predicted % 96   RV % Predicted % 95   05/26/20: Normal PFTs    Assessment & Plan:   Mild persistent asthma without complication - Plan: fluticasone -salmeterol (ADVAIR  HFA) 230-21 MCG/ACT inhaler  Post-nasal drip - Plan: fluticasone  (FLONASE ) 50 MCG/ACT nasal spray, ipratropium (ATROVENT ) 0.03 % nasal spray  Mild persistent reactive airway disease without complication - Plan: montelukast  (SINGULAIR ) 10 MG tablet  Allergic reaction, initial encounter -  Plan: EPINEPHrine  (NEFFY ) 2 MG/0.1ML SOLN, Ambulatory referral to Allergy  Discussion: Zaray Gatchel is a 83 year old woman, former smoker  who returns to pulmonary clinic for follow up.  Mild persistent asthma with chronic allergic rhinitis and postnasal drip - Continue Advair  two puffs twice daily. - Use albuterol  as needed for asthma exacerbations. - Continue Singulair , Claritin , Flonase , and Atrovent  nasal sprays. - Provide refills for inhalers, nasal sprays, and montelukast .  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 No prior EpiPen  or Neffy . Interested in Neffy  for emergencies. Suspects additional allergies. - Prescribe Neffy  (epinephrine  nasal spray) for emergency use. - Refer to allergy and immunology for further evaluation of contrast media allergy and potential new allergies. - Instruct to use Benadryl  for skin reactions without respiratory symptoms. - Instruct to use Neffy  if experiencing swelling of the mouth, lips, or tongue, or difficulty breathing.  Follow up in 1 year  Dorn Chill, MD [facility] Office: [phone]   Current Outpatient Medications:    acetaminophen  (TYLENOL ) 650 MG CR tablet, Take 650 mg by mouth every 8 (eight) hours as needed for pain., Disp: , Rfl:    albuterol  (PROAIR  HFA) 108 (90 Base) MCG/ACT inhaler, Inhale 2 puffs into the lungs every 6 (six) hours as needed for wheezing or shortness of breath., Disp: 8 g, Rfl: 5   amLODipine  (NORVASC ) 10 MG tablet, Take 1 tablet (10 mg total) by mouth daily., Disp: 90 tablet, Rfl: 3   anastrozole  (ARIMIDEX ) 1 MG tablet, Take 1 tablet (1 mg total) by mouth daily., Disp: 90 tablet, Rfl: 3   ARNICA EX, Apply 1 application topically daily as needed (pain)., Disp: , Rfl:    b complex vitamins tablet, Take 1 tablet by mouth 2 (two) times daily. PLUS FOLIC ACID PLUS VIT. C, Disp: , Rfl:    Biotin 5000 MCG CAPS, Take 5,000 mcg by mouth daily. , Disp: , Rfl:    Capsaicin 0.025 % PADS, Apply 1 application topically daily as needed (pain)., Disp: , Rfl:    Cholecalciferol  (VITAMIN D -3) 5000 UNITS TABS, Take 5,000 Units by mouth daily. , Disp: , Rfl:    clopidogrel  (PLAVIX ) 75 MG tablet, Take 1 tablet (75 mg total) by mouth 3 (three) times a week., Disp: 40 tablet, Rfl: 3   CO-ENZYME Q10-VITAMIN E PO, Take 1 tablet by mouth daily., Disp: , Rfl:    cyclobenzaprine  (FLEXERIL ) 5 MG tablet, TAKE ONE TABLET BY MOUTH  THREE TIMES DAILY AS NEEDED FOR MUSCLE SPASMS, Disp: 30 tablet, Rfl: 0   EPINEPHrine  (NEFFY ) 2 MG/0.1ML SOLN, Place 1 spray into the nose as needed (for allergic reaction including hives, swelling of mouth or lips and trouble breathing)., Disp: 1 each, Rfl: 0   escitalopram  (LEXAPRO ) 10 MG tablet, Take 1 tablet (10 mg total) by mouth daily., Disp: 90 tablet, Rfl: 3   Evolocumab  (REPATHA  SURECLICK) 140 MG/ML SOAJ, INJECT 1 PEN SUBCUTANEOUSLY  EVERY 2 WEEKS, Disp: 6 mL, Rfl: 1   hydrochlorothiazide  (MICROZIDE ) 12.5 MG capsule, Take 1 capsule (12.5 mg total) by mouth daily., Disp: 90 capsule, Rfl: 3   isosorbide  mononitrate (IMDUR ) 30 MG 24 hr tablet, TAKE ONE TABLET BY MOUTH AT BEDTIME, Disp: 90 tablet, Rfl: 0   ketoconazole (NIZORAL) 2 % shampoo, Apply 1 Application topically., Disp: , Rfl:    loratadine  (CLARITIN ) 10 MG tablet, Take 10 mg by mouth daily as needed for allergies., Disp: ,  Rfl:    Misc Natural Products (OSTEO BI-FLEX ADV TRIPLE ST PO), Take 1 tablet by mouth daily., Disp: , Rfl:    nitroGLYCERIN  (NITROSTAT ) 0.4 MG SL tablet, Place 1 tablet (0.4 mg total) under the tongue every 5 (five) minutes as needed for chest pain., Disp: 25 tablet, Rfl: 3   Omega-3 Fatty Acids (FISH OIL) 1000 MG CAPS, Take 1 capsule by mouth., Disp: , Rfl:    pantoprazole  (PROTONIX ) 40 MG tablet, Take 1 tablet (40 mg total) by mouth 2 (two) times daily., Disp: 180 tablet, Rfl: 3   Polyethyl Glycol-Propyl Glycol (SYSTANE OP), Place 1 drop into both eyes daily as needed. Dry eyes, Disp: , Rfl:    potassium chloride  (KLOR-CON ) 10 MEQ tablet, Take 2 tablets (20 mEq total) by mouth 2 (two) times daily., Disp: 360 tablet, Rfl: 0   Probiotic Product (ALIGN PO), Take 1 tablet by mouth daily., Disp: , Rfl:    Selenium  200 MCG CAPS, Take 200 mcg by mouth 2 (two) times daily. , Disp: , Rfl:    traMADol  (ULTRAM ) 50 MG tablet, Take 1 tablet (50 mg total) by mouth 2 (two) times daily., Disp: 30 tablet, Rfl: 1   fluticasone   (FLONASE ) 50 MCG/ACT nasal spray, Place 1 spray into both nostrils daily., Disp: 16 g, Rfl: 3   fluticasone -salmeterol (ADVAIR  HFA) 230-21 MCG/ACT inhaler, Inhale 2 puffs into the lungs 2 times daily., Disp: 12 g, Rfl: 11   ipratropium (ATROVENT ) 0.03 % nasal spray, Place 2 sprays into both nostrils every 12 (twelve) hours., Disp: 30 mL, Rfl: 11   montelukast  (SINGULAIR ) 10 MG tablet, Take 1 tablet (10 mg total) by mouth at bedtime., Disp: 30 tablet, Rfl: 11

## 2023-12-14 NOTE — Patient Instructions (Addendum)
 Continue advair  2 puffs twice daily - rinse mouth out after each use  Continue albuterol  inhaler as needed  Continue fluticasone  and ipratropium nasal sprays for runny nose and post nasal drainage  Continue montelukast  daily  Neffy  prescription sent in for allergic reactions with symptoms that include hives, lip/oral swelling and trouble breathing, use 1 spray  Referral to allergy placed  Follow up in 1 year, call sooner if needed

## 2023-12-19 ENCOUNTER — Encounter: Payer: Self-pay | Admitting: Obstetrics and Gynecology

## 2023-12-25 NOTE — Therapy (Incomplete)
 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 since age 79   IBS (irritable bowel syndrome)    Long term current use of aromatase inhibitor 05/08/2012   Melanoma (HCC) 2019   excised from nose early 2019   Overactive bladder    Palpitations    Pneumonia    hx of x 2    PONV (postoperative nausea and vomiting)    PVD (peripheral vascular disease)    varicose veins - left worse than right   Spinal stenosis    Stress incontinence    Stroke (HCC)    problem with expressive communicaiton   Traumatic hemopneumothorax 1982   left   Past Surgical History:  Procedure Laterality Date   ANKLE HARDWARE REMOVAL     right   BREAST BIOPSY  1976   right   BREAST BIOPSY Left 2019   benign lymph node   BREAST BIOPSY Right 08/10/2022   MM RT BREAST BX W LOC DEV 1ST LESION IMAGE BX SPEC STEREO GUIDE 08/10/2022 [facility]   BREAST BIOPSY  09/27/2022   MM RT RADIOACTIVE SEED LOC MAMMO GUIDE 09/27/2022  [facility]   BREAST LUMPECTOMY  2013   right with snbx   BREAST LUMPECTOMY WITH RADIOACTIVE SEED LOCALIZATION Right 09/28/2022   Procedure: RIGHT BREAST LUMPECTOMY WITH RADIOACTIVE SEED LOCALIZATION;  Surgeon: Vanderbilt Ned, MD;  Location: [facility];  Service: General;  Laterality: Right;   CHOLECYSTECTOMY  1990s   COLONOSCOPY     CYSTOSCOPY  07/05/2005   KNEE SURGERY  1980s   left   KYPHOPLASTY N/A 06/15/2017   Procedure: KYPHOPLASTY T11;  Surgeon: Burnetta Aures, MD;  [facility];  Service: Orthopedics;  Laterality: N/A;  90 mins   ORIF ANKLE FRACTURE  1980s   right    TOTAL KNEE ARTHROPLASTY Left 12/08/2015   Procedure: LEFT TOTAL KNEE ARTHROPLASTY;  Surgeon: Donnice Car, MD;  Location: [facility];  Service: Orthopedics;  Laterality: Left;   TRANSOBTURATOR SLING  07/05/2005   TUBAL LIGATION  1976   VEIN SURGERY     vein ablation left leg   Patient Active Problem List   Diagnosis Date Noted   Family history of breast cancer 08/18/2022   Genetic testing 08/17/2022   Malignant  neoplasm of lower-outer quadrant of right breast of female, estrogen receptor positive (HCC) 08/16/2022   CAD in native artery 05/05/2022   Atypical chest pain 11/26/2020   S/P kyphoplasty 06/15/2017   S/P total knee replacement using cement 12/08/2015   Atrial fibrillation (HCC) 11/19/2015   Anxiety and depression 07/09/2015   Varicose veins of bilateral lower extremities with other complications 06/16/2015   Essential hypertension, benign 12/08/2014   Acute hemorrhagic cystitis 02/18/2014   Stroke (HCC) 05/15/2012   Spinal stenosis    COPD (chronic obstructive pulmonary disease) (HCC)    Long term current use of aromatase inhibitor 05/08/2012   Hx of radiation therapy    Breast cancer of upper-inner quadrant of right female breast (HCC) 12/23/2011   Ataxia 06/23/2011   Contact dermatitis and eczema due to plant 01/04/2011   BACK PAIN 08/11/2009   PALPITATIONS, RECURRENT  02/27/2009   PERS HX TOBACCO USE PRESENTING HAZARDS HEALTH 12/17/2008   Hyperlipidemia 02/26/2008   ASTHMA 02/26/2008   GERD 02/12/2007    PCP: Darleene Shape NP  REFERRING PROVIDER: Carilyn Prentice BRAVO, MD   REFERRING DIAG: [phone] (ICD-10-CM) - Spondylosis without myelopathy or radiculopathy, lumbar region   Rationale for Evaluation and Treatment: Rehabilitation  THERAPY DIAG:  No diagnosis found.  ONSET DATE: ***  SUBJECTIVE:                                                                                                                                                                                           SUBJECTIVE STATEMENT: ***  PERTINENT HISTORY:  ***  PAIN:  Are you having pain? Yes: NPRS scale: *** Pain location: *** Pain description: *** Aggravating factors: *** Relieving factors: ***  PRECAUTIONS: {Therapy precautions:24002}  RED FLAGS: {PT Red Flags:29287}   WEIGHT BEARING RESTRICTIONS: No  FALLS:  Has patient fallen in last 6 months? {fallsyesno:27318}  LIVING ENVIRONMENT: Lives with: {[facility] lives with:25569::lives with their family} Lives in: {Lives in:25570} Stairs: {opstairs:27293} Has following equipment at home: {Assistive devices:23999}  OCCUPATION: ***  PLOF: {PLOF:24004}  PATIENT GOALS: ***  NEXT MD VISIT: ***  OBJECTIVE:  Note: Objective measures were completed at Evaluation unless otherwise noted.  DIAGNOSTIC FINDINGS:  MRI thoracic spine 5/25: IMPRESSION: 1. Stable MRI appearance of the Thoracic Spine since last year. 2. Stable previously augmented T11 compression fracture. No new osseous abnormality. 3. Stable very mild for age thoracic spine degeneration with no significant spinal stenosis.  MRI Lumbar spine 5/25: IMPRESSION: 1. Transitional lumbosacral anatomy with a sacralized L5 level, previously augmented T11 vertebra. Correlation with radiographs is recommended prior to any operative intervention.   2. No  acute osseous abnormality  in the lumbar spine. Mild lumbar scoliosis and grade 1 spondylolisthesis at both L1-L2 and L4-L5.   3. Mild multifactorial spinal stenosis at L1-L2 with moderate biforaminal stenosis. Moderate spinal and left foraminal stenosis at L3-L4. Moderate lateral recess stenosis at L4-L5.  PATIENT SURVEYS:  ODI  COGNITION: Overall cognitive status: {cognition:24006}     SENSATION: {sensation:27233}  MUSCLE LENGTH: Hamstrings: Right *** deg; Left *** deg   POSTURE: {posture:25561}mild thoracic kyphosis  PALPATION: TTP upper traps as well as infraspinatus area  LUMBAR ROM:   AROM eval  Flexion   Extension   Right lateral flexion   Left lateral flexion   Right rotation   Left rotation    (Blank rows = not tested)  LOWER EXTREMITY ROM:     {AROM/PROM:27142}  Right eval Left eval  Hip flexion    Hip extension    Hip abduction    Hip adduction    Hip internal rotation    Hip external rotation    Knee flexion    Knee extension    Ankle dorsiflexion    Ankle plantarflexion    Ankle inversion    Ankle eversion     (Blank rows = not tested)  LOWER EXTREMITY MMT:    MMT Right eval Left eval  Hip flexion    Hip extension    Hip abduction    Hip adduction    Hip internal rotation    Hip external rotation    Knee flexion    Knee extension    Ankle dorsiflexion    Ankle plantarflexion    Ankle inversion    Ankle eversion     (Blank rows = not tested)  LUMBAR SPECIAL TESTS:  {lumbar special test:25242}  FUNCTIONAL TESTS:  {Functional tests:24029}  GAIT: Distance walked: *** Assistive device utilized: {Assistive devices:23999} Level of assistance: {Levels of assistance:24026} Comments: ***  TREATMENT Eval Self care:Posture and Optometrist instruction; use of AD's (amb, grab bars; shower bench); safety in home                                                                                                                                 PATIENT EDUCATION:  Education details: Discussed eval findings, rehab rationale, aquatic program progression/POC and pools in area. Patient is in agreement  Person educated: Patient Education method: Explanation Education comprehension: verbalized understanding  HOME EXERCISE PROGRAM: TBA  ASSESSMENT:  CLINICAL IMPRESSION: Patient is a 83 y.o. f who was seen today for physical therapy evaluation and treatment for ***.   OBJECTIVE IMPAIRMENTS: {opptimpairments:25111}.   ACTIVITY LIMITATIONS: {activitylimitations:27494}  PARTICIPATION LIMITATIONS: {participationrestrictions:25113}  PERSONAL FACTORS: {Personal factors:25162} are also affecting patient's functional outcome.   REHAB POTENTIAL: {rehabpotential:25112}  CLINICAL DECISION MAKING: {clinical decision making:25114}  EVALUATION COMPLEXITY: {Evaluation complexity:25115}   GOALS: Goals reviewed with patient? Yes  SHORT TERM GOALS: Target date: ***  *** Baseline: Goal status: INITIAL  2.  *** Baseline:  Goal status: INITIAL  3.  *** Baseline:  Goal status: INITIAL  4.  *** Baseline:  Goal status: INITIAL  5.  *** Baseline:  Goal status: INITIAL  6.  *** Baseline:  Goal status: INITIAL  LONG TERM GOALS: Target date: ***  *** Baseline:  Goal status: INITIAL  2.  *** Baseline:  Goal status: INITIAL  3.  *** Baseline:  Goal status: INITIAL  4.  *** Baseline:  Goal status: INITIAL  5.  *** Baseline:  Goal status: INITIAL  6.  *** Baseline:  Goal status: INITIAL  PLAN:  PT FREQUENCY: {rehab frequency:25116}  PT DURATION: {rehab duration:25117}  PLANNED INTERVENTIONS: 97164- PT Re-evaluation, 97750- Physical Performance Testing, 97110-Therapeutic exercises, 97530- Therapeutic activity, 97112- Neuromuscular re-education, 97535- Self Care, 02859- Manual therapy, [id number]- Gait training, [id number]- Aquatic Therapy, [id number] (1-2 muscles), 20561 (3+ muscles)- Dry Needling,  Patient/Family education, Balance training, Stair training, Taping, Joint mobilization, Vestibular training, DME instructions, Cryotherapy, and Moist heat.  PLAN FOR NEXT SESSION: PIERRETTE Shuck [place]) Astaria Nanez MPT 12/25/23 6:48 AM [facility] [address] [place], [place], [address] Phone: [phone]   Fax:  [phone]

## 2023-12-26 ENCOUNTER — Ambulatory Visit (HOSPITAL_BASED_OUTPATIENT_CLINIC_OR_DEPARTMENT_OTHER): Attending: Physical Medicine & Rehabilitation | Admitting: Physical Therapy

## 2023-12-27 ENCOUNTER — Other Ambulatory Visit: Payer: Self-pay | Admitting: Pulmonary Disease

## 2023-12-27 ENCOUNTER — Other Ambulatory Visit (HOSPITAL_COMMUNITY): Payer: Self-pay

## 2023-12-27 ENCOUNTER — Other Ambulatory Visit: Payer: Self-pay | Admitting: Adult Health

## 2023-12-27 ENCOUNTER — Other Ambulatory Visit: Payer: Self-pay | Admitting: Physical Medicine & Rehabilitation

## 2023-12-27 DIAGNOSIS — J4530 Mild persistent asthma, uncomplicated: Secondary | ICD-10-CM

## 2023-12-28 ENCOUNTER — Encounter: Payer: Self-pay | Admitting: Physical Medicine & Rehabilitation

## 2023-12-28 ENCOUNTER — Encounter: Attending: Physical Medicine & Rehabilitation | Admitting: Physical Medicine & Rehabilitation

## 2023-12-28 ENCOUNTER — Other Ambulatory Visit (HOSPITAL_COMMUNITY): Payer: Self-pay

## 2023-12-28 VITALS — BP 149/82 | HR 83 | Ht 62.5 in | Wt 139.0 lb

## 2023-12-28 DIAGNOSIS — M47816 Spondylosis without myelopathy or radiculopathy, lumbar region: Secondary | ICD-10-CM | POA: Diagnosis present

## 2023-12-28 MED ORDER — TRAMADOL HCL 50 MG PO TABS: 50.0000 mg | ORAL_TABLET | Freq: Two times a day (BID) | ORAL | 1 refills | Status: AC

## 2023-12-28 NOTE — Progress Notes (Signed)
 Subjective:    Patient ID: Alexandria [name], female    DOB: 07-05-1940, 83 y.o.   MRN: [id number]  HPI Discussed the use of AI scribe software for clinical note transcription with the patient, who gave verbal consent to proceed.  History of Present Illness Alexandria [name] is an 83 year old female with arthritis who presents for follow-up regarding joint pain and medication management.  She experiences persistent pain and stiffness in her right knee, particularly severe in the morning. The knee feels very stiff upon waking, with pain more pronounced on the lateral side. She reports that previous x-rays showed arthritis in her knee. She is currently taking tramadol , one tablet twice a day, but sometimes reduces the dose to half a tablet to extend her supply until she can obtain a new prescription. Tramadol  is particularly necessary in the evening and at night when trying to rest.  In addition to knee pain, she experiences discomfort in her back, neck, shoulders, and hips, especially when engaging in activities such as gardening, which involves squatting. She needs to stretch to reach cabinets and perform daily activities, indicating some limitations due to joint stiffness.  She has experienced a recent episode of asthma, which she attributes to changes in weather and humidity. This episode was characterized by persistent coughing, which resolved the following day with the use of cough syrup and honey.  She also mentions having eczema on her feet and is scheduled to see a dermatologist for a full body check on October 23rd. She has tried using natural anti-inflammatory remedies such as turmeric, cinnamon, and ginger from her herb garden. However, she experiences itching when using topical anti-inflammatory creams like Voltaren gel.   Pain Inventory Average Pain 4 Pain Right Now 0 My pain is intermittent, sharp, burning, dull, stabbing, tingling, and aching  In the last 24 hours, has pain  interfered with the following? General activity 0 Relation with others 0 Enjoyment of life 5 What TIME of day is your pain at its worst? morning , evening, and night Sleep (in general) Poor  Pain is worse with: inactivity and some activites Pain improves with: rest, heat/ice, pacing activities, and medication Relief from Meds: 8  Family History  Problem Relation Age of Onset   Stroke Mother    Heart attack Father        Died age 75   Breast cancer Daughter 80       reportedly BRCA+   Breast cancer Maternal Aunt 10   Multiple myeloma Paternal Aunt 4 - 33   Breast cancer Paternal Aunt 69 - 52   Breast cancer Paternal Aunt 69 - 79       bilateral breast cancer   Dementia Paternal Aunt    Stomach cancer Paternal Uncle 68 - 70   Breast cancer Paternal Uncle 40 - 69   Congestive Heart Failure Paternal Uncle        Ischemic   Congestive Heart Failure Paternal Uncle        Ischemic   Heart attack Paternal Grandfather    Breast cancer Cousin        3 maternal first cousins   Social History   Socioeconomic History   Marital status: Widowed    Spouse name: Not on file   Number of children: Not on file   Years of education: Not on file   Highest education level: Not on file  Occupational History   Not on file  Tobacco Use  Smoking status: Former    Current packs/day: 0.00    Average packs/day: 0.5 packs/day for 15.0 years (7.5 ttl pk-yrs)    Types: Cigarettes    Start date: 08/29/1978    Quit date: 08/28/1993    Years since quitting: 30.3   Smokeless tobacco: Never  Vaping Use   Vaping status: Never Used  Substance and Sexual Activity   Alcohol use: No    Alcohol/week: 0.0 standard drinks of alcohol   Drug use: No   Sexual activity: Not Currently  Other Topics Concern   Not on file  Social History Narrative   Widowed since 2014   Retired Set designer    Son and daughter   Social Drivers of Health   Financial Resource Strain: Low Risk  (05/25/2022)   Received  from [facility]   Overall Financial Resource Strain (CARDIA)    Difficulty of Paying Living Expenses: Not hard at all  Food Insecurity: No Food Insecurity (08/23/2022)   Hunger Vital Sign    Worried About Running Out of Food in the Last Year: Never true    Ran Out of Food in the Last Year: Never true  Recent Concern: Food Insecurity - Food Insecurity Present (08/23/2022)   Hunger Vital Sign    Worried About Running Out of Food in the Last Year: Sometimes true    Ran Out of Food in the Last Year: Never true  Transportation Needs: Unknown (08/23/2022)   PRAPARE - Administrator, Civil Service (Medical): Not on file    Lack of Transportation (Non-Medical): No  Physical Activity: Insufficiently Active (11/26/2020)   Exercise Vital Sign    Days of Exercise per Week: 3 days    Minutes of Exercise per Session: 30 min  Stress: Not on file  Social Connections: Unknown (07/15/2021)   Received from [facility]   Social Network    Social Network: Not on file   Past Surgical History:  Procedure Laterality Date   ANKLE HARDWARE REMOVAL     right   BREAST BIOPSY  1976   right   BREAST BIOPSY Left 2019   benign lymph node   BREAST BIOPSY Right 08/10/2022   MM RT BREAST BX W LOC DEV 1ST LESION IMAGE BX SPEC STEREO GUIDE 08/10/2022 [facility]   BREAST BIOPSY  09/27/2022   MM RT RADIOACTIVE SEED LOC MAMMO GUIDE 09/27/2022 [facility]   BREAST LUMPECTOMY  2013   right with snbx   BREAST LUMPECTOMY WITH RADIOACTIVE SEED LOCALIZATION Right 09/28/2022   Procedure: RIGHT BREAST LUMPECTOMY WITH RADIOACTIVE SEED LOCALIZATION;  Surgeon: Vanderbilt Ned, MD;  Location: [facility];  Service: General;  Laterality: Right;   CHOLECYSTECTOMY  1990s   COLONOSCOPY     CYSTOSCOPY  07/05/2005   KNEE SURGERY  1980s   left   KYPHOPLASTY N/A 06/15/2017   Procedure: KYPHOPLASTY T11;  Surgeon: Burnetta Aures, MD;  Location: [facility];  Service: Orthopedics;  Laterality: N/A;  90 mins    ORIF ANKLE FRACTURE  1980s   right    TOTAL KNEE ARTHROPLASTY Left 12/08/2015   Procedure: LEFT TOTAL KNEE ARTHROPLASTY;  Surgeon: Donnice Car, MD;  Location: [facility];  Service: Orthopedics;  Laterality: Left;   TRANSOBTURATOR SLING  07/05/2005   TUBAL LIGATION  1976   VEIN SURGERY     vein ablation left leg   Past Surgical History:  Procedure Laterality Date   ANKLE HARDWARE REMOVAL     right   BREAST BIOPSY  1976   right   BREAST BIOPSY Left 2019   benign lymph node   BREAST BIOPSY Right 08/10/2022   MM RT BREAST BX W LOC DEV 1ST LESION IMAGE BX SPEC STEREO GUIDE 08/10/2022 [facility]   BREAST BIOPSY  09/27/2022   MM RT RADIOACTIVE SEED LOC MAMMO GUIDE 09/27/2022 [facility]   BREAST LUMPECTOMY  2013   right with snbx   BREAST LUMPECTOMY WITH RADIOACTIVE SEED LOCALIZATION Right 09/28/2022   Procedure: RIGHT BREAST LUMPECTOMY WITH RADIOACTIVE SEED LOCALIZATION;  Surgeon: Vanderbilt Ned, MD;  Location: [facility];  Service: General;  Laterality: Right;   CHOLECYSTECTOMY  1990s   COLONOSCOPY     CYSTOSCOPY  07/05/2005   KNEE SURGERY  1980s   left   KYPHOPLASTY N/A 06/15/2017   Procedure: KYPHOPLASTY T11;  Surgeon: Burnetta Aures, MD;  Location: [facility];  Service: Orthopedics;  Laterality: N/A;  90 mins   ORIF ANKLE FRACTURE  1980s   right    TOTAL KNEE ARTHROPLASTY Left 12/08/2015   Procedure: LEFT TOTAL KNEE ARTHROPLASTY;  Surgeon: Donnice Car, MD;  Location: [facility];  Service: Orthopedics;  Laterality: Left;   TRANSOBTURATOR SLING  07/05/2005   TUBAL LIGATION  1976   VEIN SURGERY     vein ablation left leg   Past Medical History:  Diagnosis Date   Allergy    Anemia    hx of    Anxiety    Arthritis    left knee, hands, back   Asthma    daily and prn inhalers   Atypical chest pain 11/26/2020   Basal cell carcinoma (BCC) 2019   Mohs on forehead early 2019   Brain aneurysm    Brainstem infarct, 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 since age 76   IBS (irritable bowel syndrome)    Long term current use of aromatase inhibitor 05/08/2012   Melanoma (HCC) 2019   excised from nose early 2019   Overactive bladder    Palpitations    Pneumonia    hx of x 2    PONV (postoperative nausea and vomiting)    PVD (peripheral vascular disease)    varicose veins - left worse than right   Spinal stenosis    Stress incontinence    Stroke (HCC)    problem with expressive communicaiton   Traumatic hemopneumothorax 1982   left   BP (!) 149/82   Pulse 83   Ht 5' 2.5 (1.588 m)   Wt 139 lb (63 kg)   SpO2 97%   BMI 25.02 kg/m   Opioid Risk Score:   Fall Risk Score:  `1  Depression screen [facility] 2/9     11/21/2023   10:37 AM 10/12/2023    1:09 PM 08/23/2022    8:54 AM 02/16/2021    2:49 PM  07/11/2019    7:38 AM 11/20/2013    9:31 AM  Depression screen PHQ 2/9  Decreased Interest 0 1 1 0 0 0  Down, Depressed, Hopeless 0 1 0 0 0 0  PHQ - 2 Score 0 2 1 0 0 0  Altered sleeping 2 2  1     Tired, decreased energy 1 2  1     Change in appetite 0 2  1    Feeling bad or failure about yourself  0 0  0    Trouble concentrating 0 1  0    Moving slowly or fidgety/restless 0 1  0    Suicidal thoughts 0 0  0    PHQ-9 Score 3 10  3     Difficult doing work/chores Somewhat difficult Very difficult  Not difficult at all        Review of Systems  Musculoskeletal:        B/L hip, hand, shoulder  pain Right knee pain  All other  systems reviewed and are negative.      Objective:   Physical Exam General No acute distress Mood and affect flat Right knee no evidence of effusion there is bony enlargement of the joint.  Flexion is limited to 100 degrees extension is full Left knee no evidence of effusion Bony enlargement of the joint less so than on the right side.  Flexion is limited to 90 degrees and extension is full Low back has no tenderness palpation along the spinous processes of the lumbar or thoracic spine no tenderness along the lumbar paraspinal area. Negative straight leg raising bilaterally Hands have no joint effusions at the wrist or MCPs. Ambulates without assistive device no evidence of toe drag or knee instability       Assessment & Plan:   Assessment and Plan Assessment & Plan Degenerative joint disease of right knee Chronic pain and stiffness, exacerbated by activities. X-rays show medial arthritis, pain reported laterally. Allergic reaction to topical anti-inflammatories. - Continue Tramadol  50 mg, adjust dosage as needed up to 60 tablets per month. - Encourage use of natural anti-inflammatories: turmeric, cinnamon, ginger.  Chronic thoracic and lumbar pain, lumbar spondylosis without myelopathy Persistent discomfort in back, neck, shoulders, and hips, affecting sleep. Exacerbated by physical activities. - Continue Tramadol  as needed. - Encourage stretching exercises for flexibility and discomfort management.  Myofascial pain syndrome Generalized pain and stiffness in neck, shoulders, and back, affecting sleep. - Continue stretching exercises to alleviate muscle tension. - Attend physical therapy session on December 4th.  Asthma Recent exacerbation resolved with supportive care.  Eczema Ongoing eczema on feet. - Attend dermatology appointment on the 23rd for full body check.

## 2023-12-28 NOTE — Patient Instructions (Addendum)
                         Contains text generated by Abridge.            VISIT SUMMARY: Today, we discussed your ongoing joint pain and medication management. We reviewed your knee arthritis, chronic pain in various areas, recent asthma episode, and eczema on your feet.  YOUR PLAN: DEGENERATIVE JOINT DISEASE OF RIGHT KNEE: You have chronic pain and stiffness in your right knee, especially in the morning and after activities. X-rays show arthritis in your knee. -Continue taking Tramadol  50 mg, adjusting the dosage as needed, up to 60 tablets per month. -Use natural anti-inflammatories like turmeric, cinnamon, and ginger.  CHRONIC THORACIC AND LUMBAR PAIN: You have persistent discomfort in your back, neck, shoulders, and hips, which is worse with physical activities. -Continue taking Tramadol  as needed. -Do stretching exercises regularly to help with flexibility and manage discomfort.  MYOFASCIAL PAIN SYNDROME: You have generalized pain and stiffness in your neck, shoulders, and back, which affects your sleep. -Continue doing stretching exercises to relieve muscle tension. -Attend your physical therapy session on December 4th.  ASTHMA: You had a recent asthma episode that resolved with supportive care. -Monitor your symptoms and use supportive care as needed.  ECZEMA: You have ongoing eczema on your feet. -Attend your dermatology appointment on October 23rd for a full body check.                      Contains text generated by Abridge.                                 Contains text generated by Abridge.                        Contains text generated by Abridge.

## 2024-01-09 ENCOUNTER — Encounter: Payer: Self-pay | Admitting: Physical Medicine & Rehabilitation

## 2024-01-09 ENCOUNTER — Other Ambulatory Visit: Payer: Self-pay

## 2024-01-09 ENCOUNTER — Ambulatory Visit: Admitting: Internal Medicine

## 2024-01-09 ENCOUNTER — Telehealth: Payer: Self-pay | Admitting: Cardiovascular Disease

## 2024-01-09 ENCOUNTER — Encounter: Payer: Self-pay | Admitting: Internal Medicine

## 2024-01-09 VITALS — BP 138/72 | HR 88 | Temp 98.5°F | Ht 62.4 in | Wt 131.4 lb

## 2024-01-09 DIAGNOSIS — J4530 Mild persistent asthma, uncomplicated: Secondary | ICD-10-CM

## 2024-01-09 DIAGNOSIS — L500 Allergic urticaria: Secondary | ICD-10-CM

## 2024-01-09 DIAGNOSIS — J3089 Other allergic rhinitis: Secondary | ICD-10-CM | POA: Diagnosis not present

## 2024-01-09 DIAGNOSIS — Z91041 Radiographic dye allergy status: Secondary | ICD-10-CM

## 2024-01-09 DIAGNOSIS — L501 Idiopathic urticaria: Secondary | ICD-10-CM

## 2024-01-09 MED ORDER — IPRATROPIUM BROMIDE 0.03 % NA SOLN: 2.0000 | Freq: Two times a day (BID) | NASAL | 5 refills | Status: AC

## 2024-01-09 MED ORDER — ALBUTEROL SULFATE HFA 108 (90 BASE) MCG/ACT IN AERS: 2.0000 | INHALATION_SPRAY | Freq: Four times a day (QID) | RESPIRATORY_TRACT | 1 refills | Status: AC | PRN

## 2024-01-09 MED ORDER — FLUTICASONE PROPIONATE 50 MCG/ACT NA SUSP: 2.0000 | Freq: Every day | NASAL | 5 refills | Status: AC

## 2024-01-09 NOTE — Patient Instructions (Addendum)
 Mild Persistent Asthma: - Maintenance inhaler: continue Advair  230-46mcg 2 puffs twice daily. - Rescue inhaler: Albuterol  2 puffs via spacer or 1 vial via nebulizer every 4-6 hours as needed for respiratory symptoms of cough, shortness of breath, or wheezing Asthma control goals:  Full participation in all desired activities (may need albuterol  before activity) Albuterol  use two times or less a week on average (not counting use with activity) Cough interfering with sleep two times or less a month Oral steroids no more than once a year No hospitalizations  Other Allergic Rhinitis: - Use nasal saline rinses before nose sprays such as with Neilmed Sinus Rinse.  Use distilled water .   - Use Flonase  2 sprays each nostril daily. Aim upward and outward. - Use Ipratroprium/Atrovent  1-2 sprays twice daily. Aim upward and outward. - Use Claritin  10mg  daily.  - Use Singulair  10mg  daily. Stop if there are any mood/behavioral changes.  Food Allergy:  - please strictly avoid shellfish. - for SKIN only reaction, okay to take Claritin  10mg  every 12 hours as needed - for SKIN + ANY additional symptoms, OR IF concern for LIFE THREATENING reaction = Neffy  2mg  in one nostril  - If using Epinephrine  autoinjector, call 911 or go to the ER.   Urticaria (Hives): - At this time etiology of hives and swelling is unknown. Hives can be caused by a variety of different triggers including illness/infection, pressure, vibrations, extremes of temperature to name a few however majority of the time there is no identifiable trigger.  -Use Claritin  10mg  twice daily as needed for hives/itching.    Hold all anti-histamines (Xyzal, Allegra, Zyrtec, Claritin , Benadryl , Pepcid) 3 days prior to next visit.  Follow up: 11/4 at 9:30 for skin testing 1-55, shellfish mix and inidivudals
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
## 2024-01-09 NOTE — Telephone Encounter (Signed)
 Spoke with [name], ok per DPR  Per [name] spoke with patient and [facility] strip advised to see her cardiologist.  Also was advised by PCP Darleene needed to be seen  Reviewed Cory's note and patient needed f/u regarding CAD/hyperlipidemia and intolerance to statins  [name] did not think any s/s and is ok with waiting for the 11/19 visit that is scheduled Advised [name] if he could get the strip into mychart could send to us  for review  He will speak in more detail with [facility] and call back if anything concerning.

## 2024-01-09 NOTE — Telephone Encounter (Signed)
 Pts son calling to state pts apple watch advised of a Cardiac Incident and suggested she see her Cardiologist. Please advise.

## 2024-01-15 ENCOUNTER — Encounter: Payer: Self-pay | Admitting: Radiology

## 2024-01-16 ENCOUNTER — Ambulatory Visit (INDEPENDENT_AMBULATORY_CARE_PROVIDER_SITE_OTHER): Admitting: Internal Medicine

## 2024-01-16 ENCOUNTER — Other Ambulatory Visit (HOSPITAL_BASED_OUTPATIENT_CLINIC_OR_DEPARTMENT_OTHER): Payer: Self-pay | Admitting: Adult Health

## 2024-01-16 DIAGNOSIS — J3089 Other allergic rhinitis: Secondary | ICD-10-CM | POA: Diagnosis not present

## 2024-01-16 DIAGNOSIS — L500 Allergic urticaria: Secondary | ICD-10-CM

## 2024-01-16 DIAGNOSIS — I25118 Atherosclerotic heart disease of native coronary artery with other forms of angina pectoris: Secondary | ICD-10-CM

## 2024-01-16 MED ORDER — AZELASTINE HCL 0.1 % NA SOLN: 2.0000 | Freq: Two times a day (BID) | NASAL | 5 refills | Status: AC | PRN

## 2024-01-16 NOTE — Progress Notes (Signed)
 FOLLOW UP Date of Service/Encounter:  01/16/24   Subjective:  Alexandria [name] (DOB: [birth year]) is a 83 y.o. female [facility] for follow up for skin testing.   History obtained from: chart review and patient.  Anti histamines held.   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 since age 59   IBS (irritable bowel syndrome)    Long term current use of aromatase inhibitor 05/08/2012   Melanoma (HCC) 2019   excised from nose early 2019   Overactive bladder    Palpitations    Pneumonia    hx of x 2    PONV (postoperative nausea and vomiting)    PVD (peripheral vascular disease)    varicose veins - left worse than right   Spinal stenosis    Stress incontinence    Stroke [facility])    problem with expressive communicaiton   Traumatic hemopneumothorax 1982   left    Objective:  There were no vitals taken for this visit. There is no height or weight on file to calculate BMI. Physical Exam: GEN: alert, well developed HEENT: clear conjunctiva, MMM LUNGS: unlabored respiration   Skin Testing:  Skin  prick testing was placed, which includes aeroallergens/foods, histamine control, and saline control.  Verbal consent was obtained prior to placing test.  Patient tolerated procedure well.  Allergy testing results were read and interpreted by myself, documented by clinical staff. Adequate positive and negative control.  Positive results to:  Results discussed with patient/family.  Airborne Adult Perc - 01/16/24 0939     Time Antigen Placed 9060    Allergen Manufacturer Jestine    Location Back    Number of Test 55    1. Control-Buffer 50% Glycerol Negative    2. Control-Histamine 3+    3. Bahia Negative    4. Bermuda Negative    5. Johnson Negative    6. [place]  Blue Negative    7. Meadow Fescue Negative    8. Perennial Rye Negative    9. Timothy Negative    10. Ragweed Mix Negative    11. Cocklebur Negative    12. Plantain,  English Negative    13. Baccharis Negative    14. Dog Fennel Negative    15. Russian Thistle Negative    16. Lamb's Quarters Negative    17. Sheep Sorrell Negative  18. Rough Pigweed Negative    19. Marsh Elder, Rough Negative    20. Mugwort, Common Negative    21. Box, Elder Negative    22. Cedar, red Negative    23. Sweet Gum Negative    24. Pecan Pollen Negative    25. Pine Mix Negative    26. Walnut, Black Pollen Negative    27. Red Mulberry Negative    28. Ash Mix Negative    29. Birch Mix Negative    30. Beech American Negative    31. Cottonwood, Eastern Negative    32. Hickory, White Negative    33. Maple Mix Negative    34. Oak, Eastern Mix Negative    35. Sycamore Eastern Negative    36. Alternaria Alternata Negative    37. Cladosporium Herbarum Negative    38. Aspergillus Mix Negative    39. Penicillium Mix Negative    40. Bipolaris Sorokiniana (Helminthosporium) Negative    41. Drechslera Spicifera (Curvularia) Negative    42. Mucor Plumbeus Negative    43. Fusarium Moniliforme Negative    45. Rhizopus Oryzae Negative    46.  Botrytis Cinera Negative    47. Epicoccum Nigrum Negative    48. Phoma Betae Negative    49. Dust Mite Mix Negative    50. Cat Hair 10,000 BAU/ml Negative    51.  Dog Epithelia Negative    52. Mixed Feathers Negative    53. Horse Epithelia Negative    54. Cockroach, German Negative    55. Tobacco Leaf Negative          Food Adult Perc - 01/16/24 0900     Time Antigen Placed 0940    Allergen Manufacturer Jestine    Location Back    Number of allergen test 6    8. Shellfish Mix Negative    23. Shrimp Negative    24. Crab Negative    25. Lobster Negative    26. Oyster Negative    27. Scallops Negative           Assessment:   1. Allergic urticaria due to ingested food   2. Other allergic rhinitis     Plan/Recommendations:   Other Allergic Rhinitis: - Due to turbinate hypertrophy, seasonal flare ups, asthma and unresponsive to over the counter meds, will perform skin testing to identify aeroallergen triggers.   - SPT 01/2024: positive to none  - Use nasal saline rinses before nose sprays such as with Neilmed Sinus Rinse.  Use distilled water .   - Use Flonase  2 sprays each nostril daily. Aim upward and outward. - Use Azelastine 2 sprays each nostril twice daily as needed for congestion and drainage.  Aim upward and outward.  - Use Ipratroprium/Atrovent  1-2 sprays twice daily as needed for runny nose. Aim upward and outward. - Use Claritin  10mg  daily.   Food Allergy:  - please strictly avoid shellfish. - Initial rxn: hives and SOB  - SPT 01/2024: negative to shellfish; will obtain blood testing. If both are negative, can consider oral challenge in clinic if interested in reintroduction.   - for SKIN only reaction, okay to take Claritin  10mg  every 12 hours as needed - for SKIN + ANY additional symptoms, OR IF concern for LIFE THREATENING reaction = Neffy  2mg  in one nostril  - If using Epinephrine  autoinjector, call 911 or go to the ER.    Urticaria (Hives): - At this  time etiology of hives and swelling is unknown. Hives can be caused  by a variety of different triggers including illness/infection, pressure, vibrations, extremes of temperature to name a few however majority of the time there is no identifiable trigger.  -Use Claritin  10mg  twice daily as needed for hives/itching.   Mild Persistent Asthma: - Continue follow up with Pulm.   - Maintenance inhaler: continue Advair  230-21mcg 2 puffs twice daily and Singulair  10mg  daily.  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  Can pretreat with anti histamines/oral steroids prior to use of contrast in the future. Epipen /Neffy  is for food allergies where accidental exposure is possible, not for radiocontrast/drug allergies.     Return in about 6 months (around 07/15/2024).  Arleta Blanch, MD [facility] 

## 2024-01-16 NOTE — Patient Instructions (Addendum)
 Chronic Rhinitis:  - SPT 01/2024: positive to none  - Use nasal saline rinses before nose sprays such as with Neilmed Sinus Rinse.  Use distilled water .   - Use Flonase  2 sprays each nostril daily. Aim upward and outward. - Use Azelastine 2 sprays each nostril twice daily as needed for congestion and drainage.  Aim upward and outward.  - Use Ipratroprium/Atrovent  1-2 sprays twice daily as needed for runny nose. Aim upward and outward. - Use Claritin  10mg  daily.   Food Allergy:  - please strictly avoid shellfish. - for SKIN only reaction, okay to take Claritin  10mg  every 12 hours as needed - for SKIN + ANY additional symptoms, OR IF concern for LIFE THREATENING reaction = Neffy  2mg  in one nostril  - If using Epinephrine  autoinjector, call 911 or go to the ER.    Urticaria (Hives): - At this time etiology of hives and swelling is unknown. Hives can be caused by a variety of different triggers including illness/infection, pressure, vibrations, extremes of temperature to name a few however majority of the time there is no identifiable trigger.  -Use Claritin  10mg  twice daily as needed for hives/itching.   Mild Persistent Asthma: - Continue follow up with Pulm.   - Maintenance inhaler: continue Advair  230-21mcg 2 puffs twice daily and Singulair  10mg  daily.  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  Can pretreat with anti histamines/oral steroids prior to use of contrast in the  future. Epipen /Neffy  is for food allergies where accidental exposure is possible, not for radiocontrast/drug allergies.

## 2024-01-19 LAB — ALLERGEN PROFILE, SHELLFISH
Clam IgE: <0.1 kU/L
F023-IgE Crab: <0.1 kU/L
F080-IgE Lobster: <0.1 kU/L
F290-IgE Oyster: <0.1 kU/L
Scallop IgE: <0.1 kU/L
Shrimp IgE: 0.19 kU/L — AB

## 2024-01-22 ENCOUNTER — Telehealth: Payer: Self-pay

## 2024-01-22 ENCOUNTER — Other Ambulatory Visit (HOSPITAL_COMMUNITY): Payer: Self-pay

## 2024-01-22 ENCOUNTER — Ambulatory Visit: Payer: Self-pay | Admitting: Internal Medicine

## 2024-01-22 DIAGNOSIS — J4530 Mild persistent asthma, uncomplicated: Secondary | ICD-10-CM

## 2024-01-22 NOTE — Telephone Encounter (Signed)
*  AA  Pharmacy Patient Advocate Encounter   Received notification from Fax that prior authorization for Fluticasone -Salmeterol 230-21MCG/ACT aerosol  is required/requested.   Insurance verification completed.   The patient is insured through [facility].   Per test claim:  Generic Advair  Diskus, Brand Symbicort, Brand Breo Ellipta, Brand Dulera  is preferred by the insurance.  If suggested medication is appropriate, Please send in a new RX and discontinue this one. If not, please advise as to why it's not appropriate so that we may request a Prior Authorization. Please note, some preferred medications may still require a PA.  If the suggested medications have not been trialed and there are no contraindications to their use, the PA will not be submitted, as it will not be approved.   CMM Key: [id number]

## 2024-01-26 MED ORDER — BUDESONIDE-FORMOTEROL FUMARATE 160-4.5 MCG/ACT IN AERO: 2.0000 | INHALATION_SPRAY | Freq: Two times a day (BID) | RESPIRATORY_TRACT | 6 refills | Status: AC

## 2024-01-26 NOTE — Telephone Encounter (Signed)
 Symbicort 160-4.5mcg 2 puffs twice daily sent to pharmacy.   JD

## 2024-01-26 NOTE — Telephone Encounter (Signed)
 Routing to Dr Kara so he can advise

## 2024-01-29 NOTE — Telephone Encounter (Signed)
 Spoke with patient VBU.   - NFN

## 2024-01-30 NOTE — Progress Notes (Unsigned)
 Cardiology Office Note:  .   Date:  01/31/2024  ID:  Alexandria [name], DOB [birth year], MRN [id number] PCP: Merna Huxley, NP  [facility] Providers Cardiologist:  Annabella Scarce, MD {  History of Present Illness: .   Alexandria [name] is a 83 y.o. 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     CAD 11/2020 reported exertional chest pain with ADLs/nitro responsive chest pain.  Coronary CTA, CAC 71 .RCA 25-49%, proximal LAD 0-24%, proximal large first diagonal 25-49%. FFR with no significant stenosis.   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  Social history  Previous retired Conservator, museum/gallery worked with a psychiatric focus. Exercise limited by orthopedic issues. Has aspirin allergy with hives No history of tobacco, alcohol, drug use.  Physically active 5+ days a week.      Patient with history of nonobstructive CAD on coronary CTA 11/2020 negative by FFR.  Last seen 02/2023 reported bruising with Plavix .  Provider and patient agreed upon Plavix  3 times per week. Patient recently messaged our office reporting device alert on their Kardia mobile device and scheduled appointment.  Today patient presents for follow-up to discuss her device alert.  She reports that approximately 1 month ago she had a abrupt sensation where her neck suddenly turned to the right and was in a fixed position.  After several minutes she started having a multitude of different symptoms including flushing, sweatiness, blurry vision, nauseous, palpitations, diffuse body pain, muscle weakness.  After a few minutes her neck  suddenly turned to the other direction.  She did not have any associated chest pain or shortness of breath during this episode.  Caregiver reports she is very active and always doing something without any exertional symptoms.  She had used her Kardia mobile device and reportedly had a device alert but unable to view what this alert was today.  She is accompanied with her caregiver and patient has very tangential responses unable to directly answer simple questions.  Caregiver and patient both acknowledge that she seems to be more confused recently.  We tried discussing different medications and she was confusing multiple medications.  Reports that she had not seen our group for several years despite seen multiple times in 2024.  Reportedly she was not taking her Repatha  since 2022.  She reported increased urinary frequency but no burning pain, blood, itchiness.  No systemic symptoms such as fever, weight loss, fatigue, chills, cough.    ROS: Denies: Chest pain, shortness of breath, orthopnea, peripheral edema, decreased exercise intolerance, fatigue, lightheadedness.   Studies Reviewed: SABRA    EKG Interpretation Date/Time:  Wednesday January 31 2024 09:12:00 EST Ventricular Rate:  85 PR Interval:  184 QRS Duration:  80 QT Interval:  364 QTC Calculation: 433 R Axis:   7  Text Interpretation: Normal sinus rhythm Normal ECG When compared with ECG of 06-Mar-2023 14:17, No significant change was found Confirmed by Darryle Currier [phone]) on 01/31/2024 9:19:07 AM    Risk Assessment/Calculations:          Physical Exam:   VS:  BP (!) 140/72   Pulse 85   Ht 5' 2.4 (1.585 m)   Wt 142 lb (64.4 kg)  SpO2 95%   BMI 25.64 kg/m    Wt Readings from Last 3 Encounters:  01/31/24 142 lb (64.4 kg)  01/09/24 131 lb 6.4 oz (59.6 kg)  12/28/23 139 lb (63 kg)    GEN: Well nourished, well developed in no acute distress NECK: No JVD; No carotid bruits CARDIAC: RRR, no murmurs, rubs, gallops RESPIRATORY:   Clear to auscultation without rales, wheezing or rhonchi  ABDOMEN: Soft, non-tender, non-distended EXTREMITIES:  No edema; No deformity   ASSESSMENT AND PLAN: .    Confusion UTI? Somewhat of a chaotic visit with difficulty to obtain appropriate responses.  No evidence of any strokelike symptoms, but struggles to respond to questions appropriately.  Caregiver reports some increased confusion but at baseline has some degree but today is worse.  I asked caregiver to get in touch with son Reported increased urinary frequency.  Obtain urinalysis, CBC with differential, BMP. Encouraged them to follow-up with PCP or neurology.  Palpitations Patient reports approximately 1 month ago she had a device alert from her Crist mobile device during episode of neck jerking with a multitude of other symptoms that sound noncardiac.  She did report palpitations so we will rule out any secondary etiologies.  EKG today is benign. Ordering 30-day heart monitor. Will defer other workup to PCP or neurology, does not quite sound like a stroke but very strange presentation.  Nonobstructive CAD Hyperlipidemia intolerant to statins - Coronary CTA 11/2020 CAC 71.  moderate disease in LAD/RCA, negative FFR. Overall stable disease without any anginal complaints or equivalents. Aspirin allergy. Previously agreed upon Plavix  3 times a week. LDL 11/2023 was 125.  She reports she has not been on Repatha  for the past 3 years but this does not seem accurate as LDL was well-controlled last year and reportedly was on Repatha  then.  Would reassess compliancy at upcoming visit.  Question accuracy of her current responses.   Continue Imdur  30 mg  Hypertension 140/72 on recheck.  Normally has well-controlled blood pressure. Continue amlodipine  10 mg, HCTZ 12.5 mg (she reports being on this twice daily per her PCP?).  She is on potassium supplementation Log blood pressure and bring to upcoming appointment  Asthma Followed by  pulmonology and allergist. No exacerbation currently.   Hx of CVA  On plavix   History of allergies Hx of Contrast Allergy This is not a true IgE mediated reaction. 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 Can pretreat with anti histamines/oral steroids prior to use of contrast in the future - per Allergist.     Dispo: 6 week follow up with Dr. Raford or Reche Finder.  Signed, Thom LITTIE Sluder, PA-C

## 2024-01-31 ENCOUNTER — Ambulatory Visit: Attending: Cardiology | Admitting: Cardiology

## 2024-01-31 ENCOUNTER — Encounter: Payer: Self-pay | Admitting: Cardiology

## 2024-01-31 VITALS — BP 140/72 | HR 85 | Ht 62.4 in | Wt 142.0 lb

## 2024-01-31 DIAGNOSIS — E785 Hyperlipidemia, unspecified: Secondary | ICD-10-CM

## 2024-01-31 DIAGNOSIS — Z86718 Personal history of other venous thrombosis and embolism: Secondary | ICD-10-CM

## 2024-01-31 DIAGNOSIS — Z8673 Personal history of transient ischemic attack (TIA), and cerebral infarction without residual deficits: Secondary | ICD-10-CM

## 2024-01-31 DIAGNOSIS — R002 Palpitations: Secondary | ICD-10-CM

## 2024-01-31 DIAGNOSIS — I25118 Atherosclerotic heart disease of native coronary artery with other forms of angina pectoris: Secondary | ICD-10-CM

## 2024-01-31 DIAGNOSIS — R399 Unspecified symptoms and signs involving the genitourinary system: Secondary | ICD-10-CM

## 2024-01-31 DIAGNOSIS — I10 Essential (primary) hypertension: Secondary | ICD-10-CM | POA: Diagnosis not present

## 2024-01-31 NOTE — Patient Instructions (Addendum)
 Medication Instructions:   Your physician recommends that you continue on your current medications as directed. Please refer to the Current Medication list given to you today.  PLEASE  KEEP A LOG AND  MONITOR BLOOD PRESSURE FOR A WEEK  TAKE AT  LEAST 30 MINUTES AFTER  TAKING MEDICINES YOU CAN  BRING WITH YOU AT FOLLOW  UP   *If you need a refill on your cardiac medications before your next appointment, please call your pharmacy*   Lab Work:  PLEASE GO DOWN STAIRS  [facility]  FIRST FLOOR   ( GET OFF ELEVATORS WALK TOWARDS WAITING AREA LAB LOCATED BY PHARMACY): BMET AND CBC   AND URINE ANALYSIS TODAY       If you have labs (blood work) drawn today and your tests are completely normal, you will receive your results only by: MyChart Message (if you have MyChart) OR A paper copy in the mail If you have any lab test that is abnormal or we need to change your treatment, we will call you to review the results.    Testing/Procedures: Your physician has recommended that you wear an event monitor. Event monitors are medical devices that record the heart's electrical activity. Doctors most often us  these monitors to diagnose arrhythmias. Arrhythmias are problems with the speed or rhythm of the heartbeat. The monitor is a small, portable device. You can wear one while you do your normal daily activities. This is usually used to diagnose what is causing palpitations/syncope (passing out).     Follow-Up: At [facility], you and your health needs are our priority.  As part of our continuing mission to provide you with exceptional heart care, our providers are all part of one team.  This team includes your primary Cardiologist (physician) and Advanced Practice Providers or APPs (Physician Assistants and Nurse Practitioners) who all work together to provide you with the care you need, when you need it.  Your next appointment:  6 week(s)  Provider:   Annabella Scarce, MD /APP   We recommend  signing up for the patient portal called MyChart.  Sign up information is provided on this After Visit Summary.  MyChart is used to connect with patients for Virtual Visits (Telemedicine).  Patients are able to view lab/test results, encounter notes, upcoming appointments, etc.  Non-urgent messages can be sent to your provider as well.   To learn more about what you can do with MyChart, go to [email or web address].   Other Instructions  .[employer] Cardiac Event Monitor Instructions  Your physician has requested you wear your cardiac event monitor for _30____ days, (1-30). [employer] may call or text to confirm a shipping address. The monitor will be sent to a land address via UPS. [employer] will not ship a monitor to a PO BOX. It typically takes 3-5 days to receive your monitor after it has been enrolled. [employer] will assist with USPS tracking if your package is delayed. The telephone number for [employer] is [phone]. Once you have received your monitor, please review the enclosed instructions. Instruction tutorials can also be viewed under help and settings on the enclosed cell phone. Your monitor has already been registered assigning a specific monitor serial # to you.  Billing and Self Pay Discount Information  [employer] has been provided the insurance information we had on file for you.  If your insurance has been updated, please call [employer] at [phone] to provide them with your updated insurance information.   [employer] offers a discounted Self Pay option  for patients who have insurance that does not cover their cardiac event monitor or patients without insurance.  The discounted cost of a Self Pay Cardiac Event Monitor would be $225.00 , if the patient contacts [employer] at [phone] within 7 days of applying the monitor to make payment arrangements.  If the patient does not contact [employer] within 7 days of applying the monitor, the cost of the cardiac event  monitor will be $350.00.  Applying the monitor  Remove cell phone from case and turn it on. The cell phone works as it consultant and needs to be within [employer] of you at all times. The cell phone will need to be charged on a daily basis. We recommend you plug the cell phone into the enclosed charger at your bedside table every night.  Monitor batteries: You will receive two monitor batteries labelled #1 and #2. These are your recorders. Plug battery #2 onto the second connection on the enclosed charger. Keep one battery on the charger at all times. This will keep the monitor battery deactivated. It will also keep it fully charged for when you need to switch your monitor batteries. A small light will be blinking on the battery emblem when it is charging. The light on the battery emblem will remain on when the battery is fully charged.  Open package of a Monitor strip. Insert battery #1 into black hood on strip and gently squeeze monitor battery onto connection as indicated in instruction booklet. Set aside while preparing skin.  Choose location for your strip, vertical or horizontal, as indicated in the instruction booklet. Shave to remove all hair from location. There cannot be any lotions, oils, powders, or colognes on skin where monitor is to be applied. Wipe skin clean with enclosed Saline wipe. Dry skin completely.  Peel paper labeled #1 off the back of the Monitor strip exposing the adhesive. Place the monitor on the chest in the vertical or horizontal position shown in the instruction booklet. One arrow on the monitor strip must be pointing upward. Carefully remove paper labeled #2, attaching remainder of strip to your skin. Try not to create any folds or wrinkles in the strip as you apply it.  Firmly press and release the circle in the center of the monitor battery. You will hear a small beep. This is turning the monitor battery on. The heart emblem on the monitor battery will  light up every 5 seconds if the monitor battery in turned on and connected to the patient securely. Do not push and hold the circle down as this turns the monitor battery off. The cell phone will locate the monitor battery. A screen will appear on the cell phone checking the connection of your monitor strip. This may read poor connection initially but change to good connection within the next minute. Once your monitor accepts the connection you will hear a series of 3 beeps followed by a climbing crescendo of beeps. A screen will appear on the cell phone showing the two monitor strip placement options. Touch the picture that demonstrates where you applied the monitor strip.  Your monitor strip and battery are waterproof. You are able to shower, bathe, or swim with the monitor on. They just ask you do not submerge deeper than 3 feet underwater. We recommend removing the monitor if you are swimming in a lake, river, or ocean.  Your monitor battery will need to be switched to a fully charged monitor battery approximately once a week. The cell phone  will alert you of an action which needs to be made.  On the cell phone, tap for details to reveal connection status, monitor battery status, and cell phone battery status. The green dots indicates your monitor is in good status. A red dot indicates there is something that needs your attention.  To record a symptom, click the circle on the monitor battery. In 30-60 seconds a list of symptoms will appear on the cell phone. Select your symptom and tap save. Your monitor will record a sustained or significant arrhythmia regardless of you clicking the button. Some patients do not feel the heart rhythm irregularities. [employer] will notify us  of any serious or critical events.  Refer to instruction booklet for instructions on switching batteries, changing strips, the Do not disturb or Pause features, or any additional questions.  Call [employer] at  [phone], to confirm your monitor is transmitting and record your baseline. They will answer any questions you may have regarding the monitor instructions at that time.  Returning the monitor to [employer]  Place all equipment back into blue box. Peel off strip of paper to expose adhesive and close box securely. There is a prepaid UPS shipping label on this box. Drop in a UPS drop box, or at a UPS facility like [employer]. You may also contact [employer] to arrange UPS to pick up monitor package at your home.

## 2024-02-01 ENCOUNTER — Ambulatory Visit: Payer: Self-pay | Admitting: Cardiology

## 2024-02-01 ENCOUNTER — Encounter: Payer: Self-pay | Admitting: *Deleted

## 2024-02-01 LAB — CBC
Hematocrit: 36.3 % (ref 34.0–46.6)
Hemoglobin: 11.6 g/dL (ref 11.1–15.9)
MCH: 30 pg (ref 26.6–33.0)
MCHC: 32 g/dL (ref 31.5–35.7)
MCV: 94 fL (ref 79–97)
Platelets: 343 x10E3/uL (ref 150–450)
RBC: 3.87 x10E6/uL (ref 3.77–5.28)
RDW: 12.5 % (ref 11.7–15.4)
WBC: 7.7 x10E3/uL (ref 3.4–10.8)

## 2024-02-01 LAB — BASIC METABOLIC PANEL WITH GFR
BUN/Creatinine Ratio: 18 (ref 12–28)
BUN: 17 mg/dL (ref 8–27)
CO2: 25 mmol/L (ref 20–29)
Calcium: 9.9 mg/dL (ref 8.7–10.3)
Chloride: 100 mmol/L (ref 96–106)
Creatinine, Ser: 0.97 mg/dL (ref 0.57–1.00)
Glucose: 96 mg/dL (ref 70–99)
Potassium: 4.4 mmol/L (ref 3.5–5.2)
Sodium: 142 mmol/L (ref 134–144)
eGFR: 59 mL/min/1.73 — ABNORMAL LOW (ref >59)

## 2024-02-01 LAB — MICROSCOPIC EXAMINATION
Bacteria, UA: NONE SEEN
Casts: NONE SEEN /LPF

## 2024-02-01 LAB — URINALYSIS, ROUTINE W REFLEX MICROSCOPIC
Bilirubin, UA: NEGATIVE
Glucose, UA: NEGATIVE
Nitrite, UA: NEGATIVE
RBC, UA: NEGATIVE
Specific Gravity, UA: 1.025 (ref 1.005–1.030)
Urobilinogen, Ur: 1 mg/dL (ref 0.2–1.0)
pH, UA: 7 (ref 5.0–7.5)

## 2024-02-01 NOTE — Progress Notes (Signed)
 Patient enrolled for [employer]/ [employer] to be shipped to her address on file.

## 2024-02-02 ENCOUNTER — Telehealth: Payer: Self-pay | Admitting: Adult Health

## 2024-02-02 NOTE — Telephone Encounter (Unsigned)
 Copied from CRM [id number]. Topic: Clinical - Medical Advice >> Feb 02, 2024 10:20 AM Franky GRADE wrote: Reason for CRM: Patient saw her Cardiologist on 01/31/2024 and had a urine test that reflects UTI. She would like to know if Darleene Shape can review labs and send antibiotics.She isn't showing symptoms.

## 2024-02-02 NOTE — Telephone Encounter (Signed)
 Patient notified that per [facility] verbally she does not have a UTI and does not need abx. Pt verbalized understanding.

## 2024-02-15 ENCOUNTER — Encounter (HOSPITAL_BASED_OUTPATIENT_CLINIC_OR_DEPARTMENT_OTHER): Payer: Self-pay | Admitting: Physical Therapy

## 2024-02-15 ENCOUNTER — Ambulatory Visit (HOSPITAL_BASED_OUTPATIENT_CLINIC_OR_DEPARTMENT_OTHER): Attending: Physical Medicine & Rehabilitation | Admitting: Physical Therapy

## 2024-02-15 ENCOUNTER — Other Ambulatory Visit: Payer: Self-pay

## 2024-02-15 DIAGNOSIS — R2689 Other abnormalities of gait and mobility: Secondary | ICD-10-CM | POA: Insufficient documentation

## 2024-02-15 DIAGNOSIS — M5459 Other low back pain: Secondary | ICD-10-CM | POA: Insufficient documentation

## 2024-02-15 DIAGNOSIS — M6281 Muscle weakness (generalized): Secondary | ICD-10-CM | POA: Insufficient documentation

## 2024-02-15 NOTE — Therapy (Signed)
 Pt arrives and reports she will be wearing a heart monitor for the next 30 days ad would prefer to defer evaluation until then.  Ronal Thornton) Raylee Strehl MPT 02/15/24 12:42 PM [facility] [address] [place], [place], [address] Phone: [phone]   Fax:  [phone]

## 2024-02-19 ENCOUNTER — Other Ambulatory Visit: Payer: Self-pay | Admitting: Adult Health

## 2024-02-27 ENCOUNTER — Ambulatory Visit: Attending: Cardiology

## 2024-02-27 DIAGNOSIS — R002 Palpitations: Secondary | ICD-10-CM

## 2024-03-04 DIAGNOSIS — R002 Palpitations: Secondary | ICD-10-CM

## 2024-03-12 ENCOUNTER — Ambulatory Visit (INDEPENDENT_AMBULATORY_CARE_PROVIDER_SITE_OTHER): Admitting: Family

## 2024-03-12 ENCOUNTER — Encounter (HOSPITAL_BASED_OUTPATIENT_CLINIC_OR_DEPARTMENT_OTHER): Payer: Self-pay | Admitting: Family

## 2024-03-12 VITALS — BP 128/70 | HR 87 | Ht 62.5 in | Wt 138.8 lb

## 2024-03-12 DIAGNOSIS — E785 Hyperlipidemia, unspecified: Secondary | ICD-10-CM | POA: Diagnosis not present

## 2024-03-12 DIAGNOSIS — Z8673 Personal history of transient ischemic attack (TIA), and cerebral infarction without residual deficits: Secondary | ICD-10-CM | POA: Diagnosis not present

## 2024-03-12 DIAGNOSIS — I10 Essential (primary) hypertension: Secondary | ICD-10-CM

## 2024-03-12 DIAGNOSIS — I25118 Atherosclerotic heart disease of native coronary artery with other forms of angina pectoris: Secondary | ICD-10-CM | POA: Diagnosis not present

## 2024-03-12 NOTE — Patient Instructions (Addendum)
 Medication Instructions:  We are starting the process to get Leqvio for your cholesterol. We will contact you once it is approved.  *If you need a refill on your cardiac medications before your next appointment, please call your pharmacy*  Follow-Up: At [facility], you and your health needs are our priority.  As part of our continuing mission to provide you with exceptional heart care, our providers are all part of one team.  This team includes your primary Cardiologist (physician) and Advanced Practice Providers or APPs (Physician Assistants and Nurse Practitioners) who all work together to provide you with the care you need, when you need it.  Your next appointment:   4 month(s)  Provider:   Annabella Scarce, MD, Rosaline Bane, NP, or Reche Finder, NP    We recommend signing up for the patient portal called MyChart.  Sign up information is provided on this After Visit Summary.  MyChart is used to connect with patients for Virtual Visits (Telemedicine).  Patients are able to view lab/test results, encounter notes, upcoming appointments, etc.  Non-urgent messages can be sent to your provider as well.   To learn more about what you can do with MyChart, go to [email or web address].   Other Instructions  Your LDL (bad cholesterol)  on cholesterol labs in September was 125. Our goal is for it to be less than 55. We are starting the process of for Leqvio which is a every 6 month injection for your cholesterol.  Sit and rest for 5-10 minutes after putting on your BP cuff prior to checking your blood pressure as this will likely get you a more accurate reading.

## 2024-03-12 NOTE — Progress Notes (Signed)
 " Cardiology Office Note:  .   Date:  03/12/2024  ID:  Alexandria [name], DOB 26-Oct-1940, MRN [id number] PCP: Merna Huxley, NP  [facility] Providers Cardiologist:  Annabella Scarce, MD    History of Present Illness: .   Alexandria [name] is a 83 y.o. female with a hx of CAD, DVT, anemia, arthritis, anxiety, asthma, acute brainstem infarct, right breast cancer s/p radiation therapy with recurrence 07/2022 requiring 09/2022 lumpectomy, COPD, DJD, depression, atrial fibrillation, GERD, hearing loss, glomerulonephritis, hyperlipidemia, hypertension, IBS, pneumonia, PVD (varicose veins), spinal stenosis, left traumatic hemopneumothorax.   Prior stress test 2010 with EF 82%, no ischemia nor infarct. Previously evaluated in 2014 by Dr. Lavona after diagnosis of stroke -due to palpitations atenolol  was transitioned to Toprol .  Echocardiogram was recommended and performed 05/2012 revealing EF 60%, no RWMA, no cardiac source of emboli.  Myoview  2014 with no evidence of ischemia nor infarct.   Evaluated 11/26/2020 after referral from PCP for hypertension.  Noted prior to a scheduled posterior vaginal wall repair 05/31/2020 she had mechanical fall.  Subsequently developed 3 small thrombi above the ventricles in her posterior head.  Another fall where she fell backwards on concrete and fractured T11 vertebrae.  Then tore her left rotator cuff.  Posterior vaginal wall repair was never able to be completed due to these medical issues but she was inclined to have that surgery.  She did note previous muscle cramps with Atorvastatin . Due to chest pain, cardiac CTA 11/2020 coronary calcium  score of 71 placing her in the 47th percentile for age and sex matched control (RCA 25-49%, proximal LAD 0-24%, proximal large first diagonal 25-49%).  FFR with no significant stenosis.   Plavix  previously reduced to 3x per week per her preference and reported bruising.   She had visit 01/31/24 after reporting palpitations with  confusion noted. UA, CBC, BMP unremarkable. Due to palpitations 7 day monitor with predominantly NSR and no significant arrhythmias.   Presents today for follow up with friend who stays with her 3-4 hours a few times per week and helps her get to appointments. 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   She got a new puppy named Pringles for Christmas. Reviewed her monitor which was reassuring.   When discussing her previous episodes report her neck turned one direction, got stuck, was flushed/sweaty, had mid-sternal pain that radiated to her back. This has not recurred. She reports persistent back pain despite her normal pain medications, follows with Dr. Carilyn [facility] and upcoming visit to establish with PT. Reports no dysuria. Reports she treated herself with lots of water  for her perceived UTI and 3-4 days later symptoms improved.   Labs with PCP 12/07/23 LDL 125. Reports she stopped her Simvastatin  due to myalgias. She did not take Repatha  as she read the side effects profile and was ocncerned it was making her feel poorly. Describes symptoms of nausea, chest discomfort. Discussed these are atypical side effects of Repatha .   ROS: Please see the history of present illness.    All other systems reviewed and are negative.   Studies Reviewed: .        Cardiac Studies & Procedures   ______________________________________________________________________________________________        MONITORS  CARDIAC EVENT MONITOR 02/27/2024  Narrative 7 Day Event Monitor  Quality: Fair.  Baseline artifact. Predominant rhythm: sinus rhythm Average heart rate: 88 bpm Max heart rate: 114 bpm Min heart rate: 62 bpm  Pauses >2.5 seconds: none  Rare (<1%) PACs Rare (<1%) PVCs  No significant arrhythmias.  Tiffany C. Raford, MD, [facility] 03/04/2024 6:02 PM   CT SCANS  CT CORONARY FRACTIONAL FLOW RESERVE DATA PREP  12/03/2020  Narrative EXAM: CT FFR ANALYSIS  FINDINGS: CT FFR analysis was performed on the original cardiac computed tomography angiogram dataset. Diagrammatic representation of the CT FFR analysis is provided in a separate PDF document in PACS. This dictation was created using the PDF document and an interactive 3D model of the results. The 3D model is not available in the EMR/PACS. Normal CT FFR range is >0.80.  1. Left Main: No significant stenosis.  2. LAD: No significant stenosis. 3. LCX: No significant stenosis. 4. RCA: No significant stenosis.  IMPRESSION: 1.  CT FFR analysis didn't show any significant stenosis.   Electronically Signed By: Oneil Parchment M.D. On: 12/04/2020 11:27   CT CORONARY MORPH W/CTA COR W/SCORE 12/03/2020  Addendum 12/03/2020  4:57 PM ADDENDUM REPORT: 12/03/2020 16:55  CLINICAL DATA:  83 year old with chest pain.  EXAM: Cardiac/Coronary  CTA  TECHNIQUE: The patient was scanned on a [employer].  FINDINGS: A 120 kV prospective scan was triggered in the descending thoracic aorta at 111 HU's. Axial non-contrast 3 mm slices were carried out through the heart. The data set was analyzed on a dedicated work station and scored using the Agatson method. Gantry rotation speed was 250 msecs and collimation was .6 mm. 0.8 mg of sl NTG was given. The 3D data set was reconstructed in 5% intervals of the 67-82 % of the R-R cycle. Diastolic phases were analyzed on a dedicated work station using MPR, MIP and VRT modes. The patient received 80 cc of contrast.  Aorta:  Normal size.  Mild calcifications.  No dissection.  Aortic Valve: No calcifications.  Coronary Arteries:  Normal coronary origin.  Right dominance.  RCA is a large dominant artery that gives rise to PDA and PLA. There is proximal calcified plaque, 25-49% stenosis.  Left main is a large artery that gives rise to LAD and LCX arteries.  LAD is a large vessel that has  proximal calcified plaque, 0-24% stenosis, proximal large first diagonal calcified plaque, 25-49% stenosis.  LCX is a non-dominant artery that gives rise to one large OM1 branch. There is no plaque.  Other findings:  Normal pulmonary vein drainage into the left atrium.  Normal left atrial appendage without a thrombus.  Normal size of the pulmonary artery.  Please see radiology report for non cardiac findings.  IMPRESSION: 1. Coronary calcium  score of 71 (LAD 50, RCA 21). This was 15 percentile for age and sex matched control.  2. Normal coronary origin with right dominance.  3. Moderate CAD in LAD and RCA distribution. Will send for FFR analysis.  4.  Aortic atherosclerosis.  CAD-RADS 2. Mild non-obstructive CAD (25-49%). Consider non-atherosclerotic causes of chest pain. Consider preventive therapy and risk factor modification.   Electronically Signed By: Oneil Parchment M.D. On: 12/03/2020 16:55  Narrative EXAM: OVER-READ INTERPRETATION  CT CHEST  The following report is an over-read performed by radiologist Dr. Toribio Aye of [facility] on 12/03/2020. This over-read does not include interpretation of cardiac or coronary anatomy or pathology. The coronary calcium  score/coronary CTA interpretation by the cardiologist is attached.  COMPARISON:  No priors.  FINDINGS: Atherosclerotic calcifications in the thoracic aorta. Chronic scarring in the inferior segment of the lingula adjacent to multiple old healed left-sided rib fractures. Within  the visualized portions of the thorax there are no suspicious appearing pulmonary nodules or masses, there is no acute consolidative airspace disease, no pleural effusions, no pneumothorax and no lymphadenopathy. Visualized portions of the upper abdomen are unremarkable. There are no aggressive appearing lytic or blastic lesions noted in the visualized portions of the skeleton. Multiple old healed left anterior  rib fractures with mild chest wall deformity.  IMPRESSION: 1.  Aortic Atherosclerosis (ICD10-I70.0).  Electronically Signed: By: Toribio Aye M.D. On: 12/03/2020 16:32     ______________________________________________________________________________________________        Risk Assessment/Calculations:             Physical Exam:   VS:  BP 128/70   Pulse 87   Ht 5' 2.5 (1.588 m)   Wt 138 lb 12.8 oz (63 kg)   SpO2 97%   BMI 24.98 kg/m    Wt Readings from Last 3 Encounters:  03/12/24 138 lb 12.8 oz (63 kg)  01/31/24 142 lb (64.4 kg)  01/09/24 131 lb 6.4 oz (59.6 kg)    Vitals:   03/12/24 1055 03/12/24 1136  BP: (!) 142/70 128/70  Pulse: 87   Height: 5' 2.5 (1.588 m)   Weight: 138 lb 12.8 oz (63 kg)   SpO2: 97%   BMI (Calculated): 24.97     GEN: Well nourished, well developed in no acute distress NECK: No JVD; No carotid bruits CARDIAC: RRR, no murmurs, rubs, gallops RESPIRATORY:  Clear to auscultation without rales, wheezing or rhonchi  ABDOMEN: Soft, non-tender, non-distended EXTREMITIES:  No edema; No deformity   ASSESSMENT AND PLAN: .    CAD - Moderate nonobstructive disease by cardiac CTA 11/2020 with no evidence of significant stenosis by FFR. No anginal symptoms. No indication for ischemic evaluation.  GDMT includes Plavix  75mg  three times per week (timing per pt preference), Imdur  30mg  daily, PRN nitroglycerin . Lipid management, as below. Heart healthy diet and regular cardiovascular exercise encouraged.     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 11/2020 LE duplex no DVT.  No indication for anticoagulation.   HLD, LDL goal <55 - Did not tolerate Atorvastatin  with myalgias. [name] discontinued Simvastatin  which she was taking only once per week for myaglias. [name] discontinued Repatha  due top erceived side effects of chest pain, nausea. Discussed atypical side effects of Repatha . Discussed resuming vs Repatha  vs Leqvio. Start  Leqvio for cholesterol management.  Therapy plan ordered. [facility] Form faxed.    HTN - BP elevated initially and improved on repeat.  Continue current antihypertensive regimen HCTZ 12.5mg  QD, Amlodipine  10mg  QD, Imdur  30mg  QHS.  Discussed to monitor BP at home at least 2 hours after medications and sitting for 5-10 minutes. If BP persistently elevated in future, plan to increase hydrochlorothiazide  dose.    Hx of CVA - Hypertension and lipid management, as above.        Dispo: follow up in 4 months  Signed, Reche GORMAN Finder, NP   "

## 2024-03-19 NOTE — Therapy (Incomplete)
 " OUTPATIENT PHYSICAL THERAPY THORACOLUMBAR EVALUATION   Patient Name: Alexandria [name] MRN: [id number] DOB:[birth year], 84 y.o., female Today's Date: 03/19/2024  END OF SESSION:   Past Medical History:  Diagnosis Date   Allergy     Anemia    hx of    Anxiety    Arthritis    left knee, hands, back   Asthma    daily and prn inhalers   Atypical chest pain 11/26/2020   Basal cell carcinoma (BCC) 2019   Mohs on forehead early 2019   Brain aneurysm    Brainstem infarct, 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 since age 22   IBS (irritable bowel syndrome)    Long term current use of aromatase inhibitor 05/08/2012   Melanoma (HCC) 2019   excised from nose early 2019   Overactive bladder    Palpitations    Pneumonia    hx of x 2    PONV (postoperative nausea and vomiting)    PVD (peripheral vascular disease)    varicose veins - left worse than right   Spinal stenosis    Stress incontinence    Stroke (HCC)    problem with expressive communicaiton   Traumatic hemopneumothorax 1982   left   Past Surgical History:  Procedure Laterality Date   ADENOIDECTOMY     ANKLE HARDWARE REMOVAL     right   BREAST BIOPSY  1976   right   BREAST BIOPSY Left 2019   benign lymph node   BREAST BIOPSY Right 08/10/2022   MM RT BREAST BX W LOC DEV 1ST LESION IMAGE BX SPEC STEREO GUIDE 08/10/2022 [facility]   BREAST BIOPSY  09/27/2022   MM RT RADIOACTIVE SEED LOC MAMMO  GUIDE 09/27/2022 [facility]   BREAST LUMPECTOMY  2013   right with snbx   BREAST LUMPECTOMY WITH RADIOACTIVE SEED LOCALIZATION Right 09/28/2022   Procedure: RIGHT BREAST LUMPECTOMY WITH RADIOACTIVE SEED LOCALIZATION;  Surgeon: Vanderbilt Ned, MD;  Location: [facility];  Service: General;  Laterality: Right;   CHOLECYSTECTOMY  1990s   COLONOSCOPY     CYSTOSCOPY  07/05/2005   KNEE SURGERY  1980s   left   KYPHOPLASTY N/A 06/15/2017   Procedure: KYPHOPLASTY T11;  Surgeon: Burnetta Aures, MD;  Location: [facility];  Service: Orthopedics;  Laterality: N/A;  90 mins   ORIF ANKLE FRACTURE  1980s   right    TONSILLECTOMY     TOTAL KNEE ARTHROPLASTY Left 12/08/2015   Procedure: LEFT TOTAL KNEE ARTHROPLASTY;  Surgeon: Donnice Car, MD;  Location: [facility];  Service: Orthopedics;  Laterality: Left;   TRANSOBTURATOR SLING  07/05/2005   TUBAL LIGATION  1976   VEIN SURGERY     vein ablation left leg   Patient Active Problem List   Diagnosis Date Noted   History of CVA (  cerebrovascular accident) 03/12/2024   Family history of breast cancer 08/18/2022   Genetic testing 08/17/2022   Malignant neoplasm of lower-outer quadrant of right breast of female, estrogen receptor positive (HCC) 08/16/2022   CAD in native artery 05/05/2022   Atypical chest pain 11/26/2020   S/P kyphoplasty 06/15/2017   S/P total knee replacement using cement 12/08/2015   Atrial fibrillation (HCC) 11/19/2015   Anxiety and depression 07/09/2015   Varicose veins of bilateral lower extremities with other complications 06/16/2015   Essential hypertension, benign 12/08/2014   Acute hemorrhagic cystitis 02/18/2014   Stroke (HCC) 05/15/2012   Spinal stenosis    COPD (chronic obstructive pulmonary disease) (HCC)    Long term current use of aromatase inhibitor 05/08/2012   Hx of radiation therapy    Breast cancer of upper-inner quadrant of right female breast (HCC) 12/23/2011   Ataxia 06/23/2011   Contact  dermatitis and eczema due to plant 01/04/2011   BACK PAIN 08/11/2009   PALPITATIONS, RECURRENT 02/27/2009   PERS HX TOBACCO USE PRESENTING HAZARDS HEALTH 12/17/2008   Hyperlipidemia LDL goal <70 02/26/2008   ASTHMA 02/26/2008   GERD 02/12/2007    PCP: Darleene Shape NP  REFERRING PROVIDER: Carilyn Prentice BRAVO, MD   REFERRING DIAG: [id number] (ICD-10-CM) - Spondylosis without myelopathy or radiculopathy, lumbar region   Rationale for Evaluation and Treatment: Rehabilitation  THERAPY DIAG:  No diagnosis found.  ONSET DATE: ***  SUBJECTIVE:                                                                                                                                                                                           SUBJECTIVE STATEMENT: ***  PERTINENT HISTORY:  patient has history of thoracic pain as well as low back pain.  She also has been diagnosed with myofascial pain syndrome. She has a history of T11 fracture that was treated with vertebroplasty -CVA evidence of remote left caudate infarct   PAIN:  Are you having pain? Yes: NPRS scale: *** Pain location: *** Pain description: .sharp, burning, stabbing, tingling, and aching Aggravating factors: prolonged sitting,walking, bending, standing, and some activites Relieving factors: sometimes standing, rest, heat/ice, therapy/exercise, pacing activities, and medication  PRECAUTIONS: {Therapy precautions:24002}  RED FLAGS: {PT Red Flags:29287}   WEIGHT BEARING RESTRICTIONS: {Yes ***/No:24003}  FALLS:  Has patient fallen in last 6 months? {fallsyesno:27318}  LIVING ENVIRONMENT: Lives with: {OPRC lives with:25569::lives with their family} Lives in: {Lives in:25570} Stairs: {opstairs:27293} Has following equipment at home: {Assistive devices:23999}  OCCUPATION: ***  PLOF: {PLOF:24004}  PATIENT GOALS: ***  NEXT MD VISIT: ***  OBJECTIVE:  Note: Objective measures were completed at Evaluation unless  otherwise noted.  DIAGNOSTIC FINDINGS:  The thoracic MRI showed the T11 vertebroplasty but no significant stenosis was noted. In the lumbar spine there is multilevel lumbar facet degenerative changes from L1-L5.   MRI 7/25 IMPRESSION: 1. Transitional lumbosacral anatomy with a sacralized L5 level, previously augmented T11 vertebra. Correlation with radiographs is recommended prior to any operative intervention.   2. No acute osseous abnormality in the lumbar spine. Mild lumbar scoliosis and grade 1 spondylolisthesis at both L1-L2 and L4-L5.   3. Mild multifactorial spinal stenosis at L1-L2 with moderate biforaminal stenosis. Moderate spinal and left foraminal stenosis at L3-L4. Moderate lateral recess stenosis at L4-L5.  PATIENT SURVEYS:  {rehab surveys:24030}  COGNITION: Overall cognitive status: {cognition:24006}     SENSATION: {sensation:27233}  MUSCLE LENGTH: Hamstrings: Right *** deg; Left *** deg Debby test: Right *** deg; Left *** deg  POSTURE: {posture:25561} mild thoracic kyphosis   PALPATION: There is tenderness palpation around T12-L1 area paraspinal There is pain in the lumbar spine with extension mainly in the upper lumbar area above L4 She has some tenderness in the upper traps bilaterally as well as infraspinatus area.  LUMBAR ROM:   AROM eval  Flexion   Extension   Right lateral flexion   Left lateral flexion   Right rotation   Left rotation    (Blank rows = not tested)  LOWER EXTREMITY ROM:     {AROM/PROM:27142}  Right eval Left eval  Hip flexion    Hip extension    Hip abduction    Hip adduction    Hip internal rotation    Hip external rotation    Knee flexion    Knee extension    Ankle dorsiflexion    Ankle plantarflexion    Ankle inversion    Ankle eversion     (Blank rows = not tested)  LOWER EXTREMITY MMT:    MMT Right eval Left eval  Hip flexion    Hip extension    Hip abduction    Hip adduction    Hip internal  rotation    Hip external rotation    Knee flexion    Knee extension    Ankle dorsiflexion    Ankle plantarflexion    Ankle inversion    Ankle eversion     (Blank rows = not tested)  LUMBAR SPECIAL TESTS:  {lumbar special test:25242}  FUNCTIONAL TESTS:  {Functional tests:24029}  GAIT: Distance walked: *** Assistive device utilized: {Assistive devices:23999} Level of assistance: {Levels of assistance:24026} Comments: ***  TREATMENT DATE: ***                                                                                                                                 PATIENT EDUCATION:  Education details: *** Person educated: {Person educated:25204} Education method: {Education Method:25205} Education comprehension: {Education Comprehension:25206}  HOME EXERCISE PROGRAM: ***  ASSESSMENT:  CLINICAL IMPRESSION: Patient is a *** y.o. *** who was seen today for  physical therapy evaluation and treatment for ***.   OBJECTIVE IMPAIRMENTS: {opptimpairments:25111}.   ACTIVITY LIMITATIONS: {activitylimitations:27494}  PARTICIPATION LIMITATIONS: {participationrestrictions:25113}  PERSONAL FACTORS: {Personal factors:25162} are also affecting patient's functional outcome.   REHAB POTENTIAL: {rehabpotential:25112}  CLINICAL DECISION MAKING: {clinical decision making:25114}  EVALUATION COMPLEXITY: {Evaluation complexity:25115}   GOALS: Goals reviewed with patient? {yes/no:20286}  SHORT TERM GOALS: Target date: ***  *** Baseline: Goal status: INITIAL  2.  *** Baseline:  Goal status: INITIAL  3.  *** Baseline:  Goal status: INITIAL  4.  *** Baseline:  Goal status: INITIAL  5.  *** Baseline:  Goal status: INITIAL  6.  *** Baseline:  Goal status: INITIAL  LONG TERM GOALS: Target date: ***  *** Baseline:  Goal status: INITIAL  2.  *** Baseline:  Goal status: INITIAL  3.  *** Baseline:  Goal status: INITIAL  4.  *** Baseline:  Goal status:  INITIAL  5.  *** Baseline:  Goal status: INITIAL  6.  *** Baseline:  Goal status: INITIAL  PLAN:  PT FREQUENCY: {rehab frequency:25116}  PT DURATION: {rehab duration:25117}  PLANNED INTERVENTIONS: {rehab planned interventions:25118::97110-Therapeutic exercises,97530- Therapeutic 218 [id number] 3002- Neuromuscular re-education,97535- Self Rjmz,02859- Manual therapy,Patient/Family education}.  PLAN FOR NEXT SESSION: ***   Frankie Kery H[place]anger, PT 03/19/2024, 3:14 PM  "

## 2024-03-20 ENCOUNTER — Ambulatory Visit (HOSPITAL_BASED_OUTPATIENT_CLINIC_OR_DEPARTMENT_OTHER): Attending: Physical Medicine & Rehabilitation | Admitting: Physical Therapy

## 2024-03-22 ENCOUNTER — Telehealth (HOSPITAL_COMMUNITY): Payer: Self-pay | Admitting: Pharmacy Technician

## 2024-03-22 NOTE — Telephone Encounter (Signed)
 Auth Submission: PENDING Site of care: [facility] Payer: [employer] MEDICARE Medication & CPT/J Code(s) submitted: Leqvio (Inclisiran) [id number] Diagnosis Code: E78.5, I25.10 Route of submission (phone, fax, portal): PORTAL Phone # Fax # Auth type: Buy/Bill HB Units/visits requested: 284mg  q 3 month x 2 doses, then 284mg  every 6 months thereafter Reference number: [id number] Approval from: *** to ***    Dagoberto Armour, CPhT [facility] Phone: [phone] 03/22/2024

## 2024-03-25 ENCOUNTER — Other Ambulatory Visit: Payer: Self-pay | Admitting: Adult Health

## 2024-04-02 ENCOUNTER — Encounter: Payer: Self-pay | Admitting: Physical Medicine & Rehabilitation

## 2024-04-02 ENCOUNTER — Encounter: Attending: Physical Medicine & Rehabilitation | Admitting: Physical Medicine & Rehabilitation

## 2024-04-02 VITALS — BP 129/78 | HR 93 | Ht 62.5 in | Wt 136.2 lb

## 2024-04-02 DIAGNOSIS — Z8673 Personal history of transient ischemic attack (TIA), and cerebral infarction without residual deficits: Secondary | ICD-10-CM | POA: Diagnosis not present

## 2024-04-02 NOTE — Addendum Note (Signed)
 Addended by: CARILYN JODIE BRAVO on: 04/02/2024 01:17 PM   Modules accepted: Orders

## 2024-04-02 NOTE — Progress Notes (Addendum)
 "  Subjective:    Patient ID: Alexandria [name], female    DOB: 1940/04/20, 84 y.o.   MRN: [id number]  HPI Discussed the use of AI scribe software for clinical note transcription with the patient, who gave verbal consent to proceed.  History of Present Illness Alexandria [name] is an 84 year old female with lumbar spondylosis, chronic back pain, gait instability, and prior stroke who presents for evaluation of a recent fall with neurological and cardiac symptoms.  In October, she experienced a fall at home in the morning while barefoot and not wearing her supportive shoes. During the episode, her head deviated to one side and then the other, resulting in loss of balance, dropping her tea, and falling to the ground. She developed acute back pain, diaphoresis, and nausea, and was unable to stand, requiring her to crawl to a chair. She was concerned about a possible cardiac event. She did not seek immediate medical attention but later informed her primary care provider. She has not had further falls since consistently wearing her supportive shoes.  Following the fall, she discontinued statin therapy due to knee and leg pain. She subsequently saw a physician assistant at her cardiology office, who suggested the episode may have represented a stroke. She was skeptical, attributing her symptoms of diaphoresis and nausea to a possible cardiac etiology, but acknowledged her prior stroke. She describes ongoing word-finding difficulties and cognitive symptoms.  She has chronic lower back pain, primarily localized to the lower lumbar region, radiating into her shoulders and worsening with standing. She identifies the lower lumbar area as the main source of pain. She has a history of spinal stenosis and spondylosis, with lumbar MRI in May demonstrating moderate facet hypertrophy at L3-4 and L4-5. She has a known allergy  to contrast dye. She has been on tramadol  for years with only partial efficacy and  intermittently uses Flexeril , prescribed in limited quantities. She uses canes in every room at home for ambulation due to poor balance.  She has previously been followed by a neurologist and neuroradiologist for annual monitoring of a stable hemorrhagic area in her head, with the most recent imaging showing no change over the past year. She last saw a neurologist approximately five years ago.   Pain Inventory Average Pain 9 Pain Right Now 6 My pain is sharp, burning, stabbing, tingling, and aching  In the last 24 hours, has pain interfered with the following? General activity 8 Relation with others 8 Enjoyment of life 9 What TIME of day is your pain at its worst? morning , daytime, and night Sleep (in general) Poor  Pain is worse with: bending, sitting, standing, and some activites Pain improves with: pacing activities and medication Relief from Meds: na  Family History  Problem Relation Age of Onset   Stroke Mother    Heart attack Father        Died age 42   Breast cancer Daughter 82       reportedly BRCA+   Breast cancer Maternal Aunt 24   Multiple myeloma Paternal Aunt 66 - 47   Breast cancer Paternal Aunt 42 - 74   Breast cancer Paternal Aunt 81 - 79       bilateral breast cancer   Dementia Paternal Aunt    Stomach cancer Paternal Uncle 44 - 10   Breast cancer Paternal Uncle 17 - 69   Congestive Heart Failure Paternal Uncle        Ischemic   Congestive Heart Failure Paternal Uncle  Ischemic   Heart attack Paternal Grandfather    Breast cancer Cousin        3 maternal first cousins   Social History   Socioeconomic History   Marital status: Widowed    Spouse name: Not on file   Number of children: Not on file   Years of education: Not on file   Highest education level: Not on file  Occupational History   Not on file  Tobacco Use   Smoking status: Former    Current packs/day: 0.00    Average packs/day: 0.5 packs/day for 15.0 years (7.5 ttl pk-yrs)     Types: Cigarettes    Start date: 08/29/1978    Quit date: 08/28/1993    Years since quitting: 30.6   Smokeless tobacco: Never  Vaping Use   Vaping status: Never Used  Substance and Sexual Activity   Alcohol use: No    Alcohol/week: 0.0 standard drinks of alcohol   Drug use: No   Sexual activity: Not Currently  Other Topics Concern   Not on file  Social History Narrative   Widowed since 2014   Retired set designer    Son and daughter   Social Drivers of Health   Tobacco Use: Medium Risk (04/02/2024)   Patient History    Smoking Tobacco Use: Former    Smokeless Tobacco Use: Never    Passive Exposure: Not on Actuary Strain: Low Risk (05/25/2022)   Received from [facility]   Overall Financial Resource Strain (CARDIA)    Difficulty of Paying Living Expenses: Not hard at all  Food Insecurity: No Food Insecurity (08/23/2022)   Hunger Vital Sign    Worried About Running Out of Food in the Last Year: Never true    Ran Out of Food in the Last Year: Never true  Recent Concern: Food Insecurity - Food Insecurity Present (08/23/2022)   Hunger Vital Sign    Worried About Running Out of Food in the Last Year: Sometimes true    Ran Out of Food in the Last Year: Never true  Transportation Needs: Unknown (08/23/2022)   PRAPARE - Administrator, Civil Service (Medical): Not on file    Lack of Transportation (Non-Medical): No  Physical Activity: Not on file  Stress: Not on file  Social Connections: Not on file  Depression (PHQ2-9): Low Risk (11/21/2023)   Depression (PHQ2-9)    PHQ-2 Score: 3  Recent Concern: Depression (PHQ2-9) - Medium Risk (10/12/2023)   Depression (PHQ2-9)    PHQ-2 Score: 10  Alcohol Screen: Not on file  Housing: Low Risk (08/23/2022)   Housing    Last Housing Risk Score: 0  Utilities: Not At Risk (08/23/2022)   [facility] Utilities    Threatened with loss of utilities: No  Recent Concern: Utilities - At Risk (08/23/2022)   [facility] Utilities     Threatened with loss of utilities: Already shut off  Health Literacy: Not on file   Past Surgical History:  Procedure Laterality Date   ADENOIDECTOMY     ANKLE HARDWARE REMOVAL     right   BREAST BIOPSY  1976   right   BREAST BIOPSY Left 2019   benign lymph node   BREAST BIOPSY Right 08/10/2022   MM RT BREAST BX W LOC DEV 1ST LESION IMAGE BX SPEC STEREO GUIDE 08/10/2022 [facility]   BREAST BIOPSY  09/27/2022   MM RT RADIOACTIVE SEED LOC MAMMO GUIDE 09/27/2022 [facility]   BREAST LUMPECTOMY  2013  right with snbx   BREAST LUMPECTOMY WITH RADIOACTIVE SEED LOCALIZATION Right 09/28/2022   Procedure: RIGHT BREAST LUMPECTOMY WITH RADIOACTIVE SEED LOCALIZATION;  Surgeon: Vanderbilt Ned, MD;  Location: [facility];  Service: General;  Laterality: Right;   CHOLECYSTECTOMY  1990s   COLONOSCOPY     CYSTOSCOPY  07/05/2005   KNEE SURGERY  1980s   left   KYPHOPLASTY N/A 06/15/2017   Procedure: KYPHOPLASTY T11;  Surgeon: Burnetta Aures, MD;  Location: [facility];  Service: Orthopedics;  Laterality: N/A;  90 mins   ORIF ANKLE FRACTURE  1980s   right    TONSILLECTOMY     TOTAL KNEE ARTHROPLASTY Left 12/08/2015   Procedure: LEFT TOTAL KNEE ARTHROPLASTY;  Surgeon: Donnice Car, MD;  Location: [facility];  Service: Orthopedics;  Laterality: Left;   TRANSOBTURATOR SLING  07/05/2005   TUBAL LIGATION  1976   VEIN SURGERY     vein ablation left leg   Past Surgical History:  Procedure Laterality Date   ADENOIDECTOMY     ANKLE HARDWARE REMOVAL     right   BREAST BIOPSY  1976   right   BREAST BIOPSY Left 2019   benign lymph node   BREAST BIOPSY Right 08/10/2022   MM RT BREAST BX W LOC DEV 1ST LESION IMAGE BX SPEC STEREO GUIDE 08/10/2022 [facility]   BREAST BIOPSY  09/27/2022   MM RT RADIOACTIVE SEED LOC MAMMO GUIDE 09/27/2022 [facility]   BREAST LUMPECTOMY  2013   right with snbx   BREAST LUMPECTOMY WITH RADIOACTIVE SEED LOCALIZATION Right 09/28/2022    Procedure: RIGHT BREAST LUMPECTOMY WITH RADIOACTIVE SEED LOCALIZATION;  Surgeon: Vanderbilt Ned, MD;  Location: [facility];  Service: General;  Laterality: Right;   CHOLECYSTECTOMY  1990s   COLONOSCOPY     CYSTOSCOPY  07/05/2005   KNEE SURGERY  1980s   left   KYPHOPLASTY N/A 06/15/2017   Procedure: KYPHOPLASTY T11;  Surgeon: Burnetta Aures, MD;  Location: [facility];  Service: Orthopedics;  Laterality: N/A;  90 mins   ORIF ANKLE FRACTURE  1980s   right    TONSILLECTOMY     TOTAL KNEE ARTHROPLASTY Left 12/08/2015   Procedure: LEFT TOTAL KNEE ARTHROPLASTY;  Surgeon: Donnice Car, MD;  Location: [facility];  Service: Orthopedics;  Laterality: Left;   TRANSOBTURATOR SLING  07/05/2005   TUBAL LIGATION  1976   VEIN SURGERY     vein ablation left leg   Past Medical History:  Diagnosis Date   Allergy     Anemia    hx of    Anxiety    Arthritis    left knee, hands, back   Asthma    daily and prn inhalers   Atypical chest pain 11/26/2020   Basal cell carcinoma (BCC) 2019   Mohs on forehead early 2019   Brain aneurysm    Brainstem infarct, 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 since age 35   IBS (irritable bowel syndrome)    Long term current use of aromatase inhibitor 05/08/2012   Melanoma (HCC) 2019   excised from nose early 2019   Overactive bladder    Palpitations    Pneumonia    hx of x 2    PONV (postoperative nausea and vomiting)    PVD  (peripheral vascular disease)    varicose veins - left worse than right   Spinal stenosis    Stress incontinence    Stroke [facility])    problem with expressive communicaiton   Traumatic hemopneumothorax 1982   left   BP 129/78   Pulse 93   Ht 5' 2.5 (1.588 m)   Wt 136 lb 3.2 oz (61.8 kg)   SpO2 95%   BMI 24.51 kg/m   Opioid Risk Score:   Fall Risk Score:  `1  Depression screen [facility] 2/9     04/02/2024   10:20 AM 11/21/2023   10:37 AM 10/12/2023    1:09 PM 08/23/2022    8:54 AM 02/16/2021    2:49 PM 07/11/2019    7:38 AM 11/20/2013    9:31 AM  Depression screen PHQ 2/9  Decreased Interest 0 0 1 1 0 0 0  Down, Depressed, Hopeless 0 0 1 0 0 0 0  PHQ - 2 Score 0 0 2 1 0 0 0  Altered sleeping  2 2  1     Tired, decreased energy  1 2  1     Change in appetite  0 2  1    Feeling bad or failure about yourself   0 0  0    Trouble concentrating  0 1  0    Moving slowly or fidgety/restless  0 1  0    Suicidal thoughts  0 0  0    PHQ-9 Score  3  10   3      Difficult doing work/chores  Somewhat difficult Very difficult  Not difficult at all       Data saved with a previous flowsheet row definition     Review of Systems  Musculoskeletal:  Positive for back pain and gait problem.  All other systems reviewed and are negative.      Objective:   Physical Exam Elderly female frail-appearing no acute distress Hard of hearing, needs repetition Oriented to person time but not place she knows it is a doctor's office but cannot name the large street that it is on she does remember the address.  Only the number. She had difficulty recounting her medical history this is correlated with her electronic medical record Speech without dysarthria or aphasia, mildly tremulous Motor strength is 4/5 bilateral deltoid bicep tricep grip as well as hip flexor knee extensor ankle dorsiflexion Positive Romberg Ambulates with narrow-base quad cane no evidence of toe drag or knee instability she does have  widened base No evidence of dysmetria upper extremity finger-to-nose testing  Cranial nerves II through XII are intact Lower extremity sensation intact to light touch        Assessment & Plan:   Assessment and Plan Assessment & Plan Chronic lumbar spondylosis with associated chronic back pain Confirmed by May MRI showing spinal stenosis, moderate facet hypertrophy, and spondylosis at L3-4 and L4-5. Pain localized to lower lumbar region. Long-term tramadol  use has questionable efficacy. Flexeril  contraindicated due to fall risk. Lumbar injections appropriate to assess  pain relief before changing oral analgesics. - Reviewed May lumbar MRI findings and confirmed pain localization to lower lumbar region. - Planned lumbar test injections without contrast due to allergy  history. Lumbar medial branch blocks bilateral L3 and L4 medial branch and L5 dorsal ramus under fluoroscopic guidance No contrast - Instructed her to schedule lumbar injection appointment at this clinic. - Recommended trial of injections prior to any adjustments in oral pain medications.  Gait instability and poor balance Demonstrates poor balance, unable to stand with feet together and eyes closed without falling. Requires cane for ambulation. Recent fall at home with significant safety concerns. No evidence of neuropathy or significant lower extremity weakness. - Provided letter for postmaster documenting inability to ambulate safely to mailbox and recommending mail delivery to her front door.  History of stroke with cognitive symptoms History of prior stroke. In October, experienced symptoms raising concern for possible recurrent stroke versus cardiac event. Exhibits word-finding difficulties and cognitive symptoms. Further neurological evaluation warranted. - Referred to [facility] in [place] for evaluation of possible recurrent stroke and cognitive symptoms.  Schedule her at this practice stated they would not  be able to see her since she was a previous patient of theirs who then went to [facility] neurologic and later to [facility].   Will send a referral to [facility].   "

## 2024-04-04 ENCOUNTER — Encounter: Payer: Self-pay | Admitting: Psychology

## 2024-04-04 ENCOUNTER — Encounter: Payer: Self-pay | Admitting: Neurology

## 2024-04-11 ENCOUNTER — Inpatient Hospital Stay (HOSPITAL_COMMUNITY): Admission: RE | Admit: 2024-04-11 | Source: Ambulatory Visit

## 2024-04-11 ENCOUNTER — Encounter (HOSPITAL_COMMUNITY)

## 2024-04-16 ENCOUNTER — Encounter (HOSPITAL_BASED_OUTPATIENT_CLINIC_OR_DEPARTMENT_OTHER): Payer: Self-pay | Admitting: Physical Therapy

## 2024-04-17 ENCOUNTER — Other Ambulatory Visit: Payer: Self-pay | Admitting: Adult Health

## 2024-04-17 ENCOUNTER — Inpatient Hospital Stay (HOSPITAL_COMMUNITY)
Admission: RE | Admit: 2024-04-17 | Discharge: 2024-04-17 | Disposition: A | Source: Ambulatory Visit | Attending: Family

## 2024-04-17 VITALS — BP 148/79 | HR 89 | Temp 97.7°F | Resp 18

## 2024-04-17 DIAGNOSIS — I2510 Atherosclerotic heart disease of native coronary artery without angina pectoris: Secondary | ICD-10-CM

## 2024-04-17 DIAGNOSIS — E785 Hyperlipidemia, unspecified: Secondary | ICD-10-CM

## 2024-04-17 DIAGNOSIS — Z8673 Personal history of transient ischemic attack (TIA), and cerebral infarction without residual deficits: Secondary | ICD-10-CM

## 2024-04-17 MED ORDER — INCLISIRAN SODIUM 284 MG/1.5ML ~~LOC~~ SOSY
PREFILLED_SYRINGE | SUBCUTANEOUS | Status: AC
  Filled 2024-04-17: qty 1.5

## 2024-04-17 MED ORDER — INCLISIRAN SODIUM 284 MG/1.5ML ~~LOC~~ SOSY
284.0000 mg | PREFILLED_SYRINGE | Freq: Once | SUBCUTANEOUS | Status: AC
  Administered 2024-04-17: 284 mg via SUBCUTANEOUS

## 2024-04-18 ENCOUNTER — Encounter (HOSPITAL_BASED_OUTPATIENT_CLINIC_OR_DEPARTMENT_OTHER): Payer: Self-pay | Admitting: Physical Therapy

## 2024-04-18 ENCOUNTER — Ambulatory Visit (HOSPITAL_BASED_OUTPATIENT_CLINIC_OR_DEPARTMENT_OTHER): Attending: Physical Medicine & Rehabilitation | Admitting: Physical Therapy

## 2024-04-18 ENCOUNTER — Other Ambulatory Visit: Payer: Self-pay

## 2024-04-18 DIAGNOSIS — R2689 Other abnormalities of gait and mobility: Secondary | ICD-10-CM

## 2024-04-18 DIAGNOSIS — M5459 Other low back pain: Secondary | ICD-10-CM

## 2024-04-18 DIAGNOSIS — M6281 Muscle weakness (generalized): Secondary | ICD-10-CM

## 2024-04-24 ENCOUNTER — Institutional Professional Consult (permissible substitution): Payer: Self-pay | Admitting: Psychology

## 2024-04-24 ENCOUNTER — Ambulatory Visit: Payer: Self-pay

## 2024-05-01 ENCOUNTER — Encounter: Payer: Self-pay | Admitting: Psychology

## 2024-05-14 ENCOUNTER — Encounter: Admitting: Physical Medicine & Rehabilitation

## 2024-05-15 ENCOUNTER — Ambulatory Visit (HOSPITAL_BASED_OUTPATIENT_CLINIC_OR_DEPARTMENT_OTHER): Admitting: Physical Therapy

## 2024-05-21 ENCOUNTER — Ambulatory Visit (HOSPITAL_BASED_OUTPATIENT_CLINIC_OR_DEPARTMENT_OTHER): Admitting: Physical Therapy

## 2024-05-23 ENCOUNTER — Encounter (HOSPITAL_BASED_OUTPATIENT_CLINIC_OR_DEPARTMENT_OTHER)

## 2024-05-28 ENCOUNTER — Encounter (HOSPITAL_BASED_OUTPATIENT_CLINIC_OR_DEPARTMENT_OTHER)

## 2024-05-30 ENCOUNTER — Ambulatory Visit (HOSPITAL_BASED_OUTPATIENT_CLINIC_OR_DEPARTMENT_OTHER): Admitting: Physical Therapy

## 2024-06-04 ENCOUNTER — Encounter (HOSPITAL_BASED_OUTPATIENT_CLINIC_OR_DEPARTMENT_OTHER)

## 2024-06-06 ENCOUNTER — Ambulatory Visit (HOSPITAL_BASED_OUTPATIENT_CLINIC_OR_DEPARTMENT_OTHER): Admitting: Physical Therapy

## 2024-06-11 ENCOUNTER — Encounter (HOSPITAL_BASED_OUTPATIENT_CLINIC_OR_DEPARTMENT_OTHER)

## 2024-06-13 ENCOUNTER — Ambulatory Visit (HOSPITAL_BASED_OUTPATIENT_CLINIC_OR_DEPARTMENT_OTHER): Admitting: Physical Therapy

## 2024-06-25 ENCOUNTER — Encounter: Admitting: Physical Medicine & Rehabilitation

## 2024-06-25 ENCOUNTER — Ambulatory Visit (HOSPITAL_BASED_OUTPATIENT_CLINIC_OR_DEPARTMENT_OTHER): Admitting: Family

## 2024-07-12 ENCOUNTER — Encounter (HOSPITAL_COMMUNITY)

## 2024-07-16 ENCOUNTER — Encounter (HOSPITAL_COMMUNITY)

## 2024-07-16 ENCOUNTER — Ambulatory Visit: Admitting: Internal Medicine

## 2024-07-17 ENCOUNTER — Encounter (HOSPITAL_COMMUNITY)

## 2024-07-17 ENCOUNTER — Ambulatory Visit: Payer: Self-pay | Admitting: Neurology

## 2024-10-10 ENCOUNTER — Encounter (HOSPITAL_COMMUNITY)
